# Patient Record
Sex: Female | Born: 1935 | Race: White | Hispanic: No | Smoking: Never smoker
Health system: Southern US, Community
[De-identification: ages and names within clinical notes are randomized; demographics above are authoritative.]

## PROBLEM LIST (undated history)

## (undated) DIAGNOSIS — E039 Hypothyroidism, unspecified: Secondary | ICD-10-CM

## (undated) DIAGNOSIS — I639 Cerebral infarction, unspecified: Secondary | ICD-10-CM

## (undated) DIAGNOSIS — K5792 Diverticulitis of intestine, part unspecified, without perforation or abscess without bleeding: Secondary | ICD-10-CM

## (undated) DIAGNOSIS — Z7901 Long term (current) use of anticoagulants: Secondary | ICD-10-CM

## (undated) DIAGNOSIS — Z90710 Acquired absence of both cervix and uterus: Secondary | ICD-10-CM

## (undated) DIAGNOSIS — I4821 Permanent atrial fibrillation: Secondary | ICD-10-CM

## (undated) DIAGNOSIS — O2230 Deep phlebothrombosis in pregnancy, unspecified trimester: Secondary | ICD-10-CM

## (undated) DIAGNOSIS — I10 Essential (primary) hypertension: Secondary | ICD-10-CM

## (undated) DIAGNOSIS — R001 Bradycardia, unspecified: Secondary | ICD-10-CM

## (undated) DIAGNOSIS — R42 Dizziness and giddiness: Principal | ICD-10-CM

## (undated) DIAGNOSIS — F419 Anxiety disorder, unspecified: Secondary | ICD-10-CM

## (undated) DIAGNOSIS — S329XXA Fracture of unspecified parts of lumbosacral spine and pelvis, initial encounter for closed fracture: Secondary | ICD-10-CM

## (undated) DIAGNOSIS — M4120 Other idiopathic scoliosis, site unspecified: Secondary | ICD-10-CM

## (undated) HISTORY — DX: Bradycardia, unspecified: R00.1

## (undated) HISTORY — DX: Cerebral infarction, unspecified: I63.9

## (undated) HISTORY — DX: Long term (current) use of anticoagulants: Z79.01

## (undated) HISTORY — DX: Dizziness and giddiness: R42

## (undated) HISTORY — DX: Hypothyroidism, unspecified: E03.9

## (undated) HISTORY — DX: Permanent atrial fibrillation: I48.21

## (undated) HISTORY — DX: Acquired absence of both cervix and uterus: Z90.710

## (undated) HISTORY — DX: Anxiety disorder, unspecified: F41.9

## (undated) HISTORY — DX: Diverticulitis of intestine, part unspecified, without perforation or abscess without bleeding: K57.92

## (undated) HISTORY — PX: CHOLECYSTECTOMY: SHX55

## (undated) HISTORY — DX: Fracture of unspecified parts of lumbosacral spine and pelvis, initial encounter for closed fracture: S32.9XXA

## (undated) HISTORY — DX: Other idiopathic scoliosis, site unspecified: M41.20

## (undated) HISTORY — DX: Deep phlebothrombosis in pregnancy, unspecified trimester: O22.30

## (undated) HISTORY — DX: Essential (primary) hypertension: I10

---

## 1998-04-05 ENCOUNTER — Other Ambulatory Visit: Admission: RE | Admit: 1998-04-05 | Discharge: 1998-04-05 | Payer: Self-pay | Admitting: Cardiology

## 1998-05-14 ENCOUNTER — Other Ambulatory Visit: Admission: RE | Admit: 1998-05-14 | Discharge: 1998-05-14 | Payer: Self-pay | Admitting: Cardiology

## 1998-06-25 ENCOUNTER — Inpatient Hospital Stay (HOSPITAL_COMMUNITY): Admission: EM | Admit: 1998-06-25 | Discharge: 1998-06-28 | Payer: Self-pay | Admitting: Emergency Medicine

## 1998-09-29 ENCOUNTER — Ambulatory Visit (HOSPITAL_COMMUNITY): Admission: RE | Admit: 1998-09-29 | Discharge: 1998-09-29 | Payer: Self-pay | Admitting: Family Medicine

## 1999-01-08 HISTORY — PX: CARDIAC CATHETERIZATION: SHX172

## 1999-01-10 ENCOUNTER — Emergency Department (HOSPITAL_COMMUNITY): Admission: EM | Admit: 1999-01-10 | Discharge: 1999-01-10 | Payer: Self-pay | Admitting: Emergency Medicine

## 1999-01-10 ENCOUNTER — Encounter: Payer: Self-pay | Admitting: Emergency Medicine

## 1999-01-18 ENCOUNTER — Ambulatory Visit (HOSPITAL_COMMUNITY): Admission: RE | Admit: 1999-01-18 | Discharge: 1999-01-18 | Payer: Self-pay | Admitting: Cardiology

## 1999-02-07 ENCOUNTER — Encounter: Payer: Self-pay | Admitting: Emergency Medicine

## 1999-02-07 ENCOUNTER — Emergency Department (HOSPITAL_COMMUNITY): Admission: EM | Admit: 1999-02-07 | Discharge: 1999-02-07 | Payer: Self-pay | Admitting: Emergency Medicine

## 1999-05-14 ENCOUNTER — Ambulatory Visit (HOSPITAL_COMMUNITY): Admission: AD | Admit: 1999-05-14 | Discharge: 1999-05-14 | Payer: Self-pay | Admitting: Cardiology

## 1999-12-03 HISTORY — PX: CARDIOVERSION: SHX1299

## 2000-02-03 ENCOUNTER — Ambulatory Visit (HOSPITAL_COMMUNITY): Admission: RE | Admit: 2000-02-03 | Discharge: 2000-02-03 | Payer: Self-pay | Admitting: Cardiology

## 2000-02-03 ENCOUNTER — Encounter: Payer: Self-pay | Admitting: Cardiology

## 2000-02-27 ENCOUNTER — Encounter: Payer: Self-pay | Admitting: Cardiology

## 2000-02-27 ENCOUNTER — Inpatient Hospital Stay (HOSPITAL_COMMUNITY): Admission: AD | Admit: 2000-02-27 | Discharge: 2000-03-04 | Payer: Self-pay | Admitting: Cardiology

## 2000-03-03 ENCOUNTER — Encounter: Payer: Self-pay | Admitting: Cardiology

## 2000-03-04 ENCOUNTER — Encounter: Payer: Self-pay | Admitting: Cardiology

## 2000-03-16 ENCOUNTER — Encounter: Payer: Self-pay | Admitting: Emergency Medicine

## 2000-03-16 ENCOUNTER — Inpatient Hospital Stay (HOSPITAL_COMMUNITY): Admission: EM | Admit: 2000-03-16 | Discharge: 2000-03-16 | Payer: Self-pay | Admitting: Emergency Medicine

## 2000-05-26 ENCOUNTER — Encounter: Payer: Self-pay | Admitting: Emergency Medicine

## 2000-05-26 ENCOUNTER — Encounter: Admission: RE | Admit: 2000-05-26 | Discharge: 2000-05-26 | Payer: Self-pay | Admitting: Emergency Medicine

## 2000-07-26 ENCOUNTER — Encounter: Payer: Self-pay | Admitting: Cardiology

## 2000-07-26 ENCOUNTER — Inpatient Hospital Stay (HOSPITAL_COMMUNITY): Admission: EM | Admit: 2000-07-26 | Discharge: 2000-07-27 | Payer: Self-pay | Admitting: Emergency Medicine

## 2001-01-06 ENCOUNTER — Encounter (INDEPENDENT_AMBULATORY_CARE_PROVIDER_SITE_OTHER): Payer: Self-pay | Admitting: Specialist

## 2001-01-06 ENCOUNTER — Ambulatory Visit (HOSPITAL_COMMUNITY): Admission: RE | Admit: 2001-01-06 | Discharge: 2001-01-06 | Payer: Self-pay | Admitting: Gastroenterology

## 2001-06-10 ENCOUNTER — Encounter: Admission: RE | Admit: 2001-06-10 | Discharge: 2001-06-10 | Payer: Self-pay | Admitting: Emergency Medicine

## 2001-06-10 ENCOUNTER — Encounter: Payer: Self-pay | Admitting: Emergency Medicine

## 2001-08-20 ENCOUNTER — Encounter: Admission: RE | Admit: 2001-08-20 | Discharge: 2001-08-20 | Payer: Self-pay | Admitting: Emergency Medicine

## 2001-08-20 ENCOUNTER — Encounter: Payer: Self-pay | Admitting: Emergency Medicine

## 2002-06-22 ENCOUNTER — Encounter: Admission: RE | Admit: 2002-06-22 | Discharge: 2002-06-22 | Payer: Self-pay | Admitting: Emergency Medicine

## 2002-06-22 ENCOUNTER — Encounter: Payer: Self-pay | Admitting: Emergency Medicine

## 2002-12-27 ENCOUNTER — Inpatient Hospital Stay (HOSPITAL_COMMUNITY): Admission: AD | Admit: 2002-12-27 | Discharge: 2002-12-30 | Payer: Self-pay | Admitting: Cardiology

## 2002-12-27 ENCOUNTER — Encounter: Payer: Self-pay | Admitting: Cardiology

## 2002-12-29 HISTORY — PX: CARDIOVERSION: SHX1299

## 2003-07-10 ENCOUNTER — Encounter: Admission: RE | Admit: 2003-07-10 | Discharge: 2003-07-10 | Payer: Self-pay | Admitting: Emergency Medicine

## 2003-07-10 ENCOUNTER — Encounter: Payer: Self-pay | Admitting: Emergency Medicine

## 2003-09-07 ENCOUNTER — Emergency Department (HOSPITAL_COMMUNITY): Admission: EM | Admit: 2003-09-07 | Discharge: 2003-09-07 | Payer: Self-pay | Admitting: Emergency Medicine

## 2003-09-07 ENCOUNTER — Encounter: Payer: Self-pay | Admitting: Emergency Medicine

## 2004-01-10 ENCOUNTER — Emergency Department (HOSPITAL_COMMUNITY): Admission: EM | Admit: 2004-01-10 | Discharge: 2004-01-10 | Payer: Self-pay | Admitting: Emergency Medicine

## 2004-08-12 ENCOUNTER — Encounter: Admission: RE | Admit: 2004-08-12 | Discharge: 2004-08-12 | Payer: Self-pay | Admitting: Emergency Medicine

## 2005-09-23 ENCOUNTER — Encounter: Admission: RE | Admit: 2005-09-23 | Discharge: 2005-09-23 | Payer: Self-pay | Admitting: Emergency Medicine

## 2006-03-03 ENCOUNTER — Ambulatory Visit (HOSPITAL_COMMUNITY): Admission: RE | Admit: 2006-03-03 | Discharge: 2006-03-03 | Payer: Self-pay | Admitting: *Deleted

## 2006-03-03 HISTORY — PX: PACEMAKER INSERTION: SHX728

## 2006-05-12 ENCOUNTER — Encounter: Admission: RE | Admit: 2006-05-12 | Discharge: 2006-05-12 | Payer: Self-pay | Admitting: Emergency Medicine

## 2006-09-17 ENCOUNTER — Encounter: Payer: Self-pay | Admitting: Vascular Surgery

## 2006-09-17 ENCOUNTER — Encounter: Payer: Self-pay | Admitting: Cardiology

## 2006-09-17 ENCOUNTER — Inpatient Hospital Stay (HOSPITAL_COMMUNITY): Admission: EM | Admit: 2006-09-17 | Discharge: 2006-09-20 | Payer: Self-pay | Admitting: Emergency Medicine

## 2006-09-29 ENCOUNTER — Encounter: Admission: RE | Admit: 2006-09-29 | Discharge: 2006-09-29 | Payer: Self-pay | Admitting: Emergency Medicine

## 2006-10-30 ENCOUNTER — Ambulatory Visit (HOSPITAL_COMMUNITY): Admission: RE | Admit: 2006-10-30 | Discharge: 2006-10-30 | Payer: Self-pay | Admitting: Cardiology

## 2006-10-30 HISTORY — PX: CARDIOVERSION: SHX1299

## 2007-12-14 ENCOUNTER — Encounter: Admission: RE | Admit: 2007-12-14 | Discharge: 2007-12-14 | Payer: Self-pay | Admitting: Emergency Medicine

## 2008-03-24 ENCOUNTER — Encounter: Admission: RE | Admit: 2008-03-24 | Discharge: 2008-03-24 | Payer: Self-pay | Admitting: Gastroenterology

## 2008-09-05 ENCOUNTER — Encounter: Admission: RE | Admit: 2008-09-05 | Discharge: 2008-09-05 | Payer: Self-pay | Admitting: Emergency Medicine

## 2009-05-14 ENCOUNTER — Emergency Department (HOSPITAL_COMMUNITY): Admission: EM | Admit: 2009-05-14 | Discharge: 2009-05-14 | Payer: Self-pay | Admitting: Emergency Medicine

## 2009-08-17 ENCOUNTER — Encounter: Admission: RE | Admit: 2009-08-17 | Discharge: 2009-08-17 | Payer: Self-pay | Admitting: Emergency Medicine

## 2009-11-28 ENCOUNTER — Observation Stay (HOSPITAL_COMMUNITY): Admission: EM | Admit: 2009-11-28 | Discharge: 2009-11-30 | Payer: Self-pay | Admitting: Emergency Medicine

## 2009-11-29 ENCOUNTER — Encounter (INDEPENDENT_AMBULATORY_CARE_PROVIDER_SITE_OTHER): Payer: Self-pay | Admitting: Internal Medicine

## 2010-07-12 ENCOUNTER — Ambulatory Visit: Payer: Self-pay | Admitting: Cardiovascular Disease

## 2010-08-08 ENCOUNTER — Ambulatory Visit: Payer: Self-pay | Admitting: Cardiology

## 2010-09-20 ENCOUNTER — Encounter: Admission: RE | Admit: 2010-09-20 | Discharge: 2010-09-20 | Payer: Self-pay | Admitting: Family Medicine

## 2010-09-20 ENCOUNTER — Ambulatory Visit: Payer: Self-pay | Admitting: Cardiology

## 2010-10-04 ENCOUNTER — Ambulatory Visit: Payer: Self-pay | Admitting: Cardiology

## 2010-10-07 ENCOUNTER — Encounter: Payer: Self-pay | Admitting: Internal Medicine

## 2010-11-01 ENCOUNTER — Ambulatory Visit: Payer: Self-pay | Admitting: Cardiology

## 2010-11-13 ENCOUNTER — Ambulatory Visit: Payer: Self-pay | Admitting: Internal Medicine

## 2010-11-15 ENCOUNTER — Encounter: Payer: Self-pay | Admitting: Internal Medicine

## 2010-11-21 ENCOUNTER — Ambulatory Visit: Payer: Self-pay | Admitting: Cardiology

## 2010-12-21 ENCOUNTER — Encounter: Payer: Self-pay | Admitting: Emergency Medicine

## 2010-12-22 ENCOUNTER — Encounter: Payer: Self-pay | Admitting: Emergency Medicine

## 2010-12-23 ENCOUNTER — Ambulatory Visit: Payer: Self-pay | Admitting: Cardiology

## 2010-12-31 NOTE — Miscellaneous (Signed)
Summary: Device preload  Clinical Lists Changes  Observations: Added new observation of PPM INDICATN: A-fib (10/07/2010 8:37) Added new observation of MAGNET RTE: BOL 85 ERI 65 (10/07/2010 8:37) Added new observation of PPMLEADSTAT2: active (10/07/2010 8:37) Added new observation of PPMLEADSER2: 161096  (10/07/2010 8:37) Added new observation of PPMLEADMOD2: 4463  (10/07/2010 8:37) Added new observation of PPMLEADDOI2: 03/03/2000  (10/07/2010 8:37) Added new observation of PPMLEADLOC2: RV  (10/07/2010 8:37) Added new observation of PPMLEADSTAT1: active  (10/07/2010 8:37) Added new observation of PPMLEADSER1: 045409  (10/07/2010 8:37) Added new observation of PPMLEADMOD1: 4470  (10/07/2010 8:37) Added new observation of PPMLEADDOI1: 03/03/2000  (10/07/2010 8:37) Added new observation of PPMLEADLOC1: RA  (10/07/2010 8:37) Added new observation of PPM DOI: 03/03/2006  (10/07/2010 8:37) Added new observation of PPM SERL#: [id number]  (10/07/2010 8:37) Added new observation of PPM MODL#: [id number]  (10/07/2010 8:37) Added new observation of PACEMAKERMFG: Medtronic  (10/07/2010 8:37) Added new observation of PPM IMP MD: Charlynn Court  (10/07/2010 8:37) Added new observation of PPM REFER MD: Vonna Drafts  (10/07/2010 8:37) Added new observation of PACEMAKER MD: Hillis Range, MD  (10/07/2010 8:37)      PPM Specifications Following MD:  Hillis Range, MD     Referring MD:  Vonna Drafts PPM Vendor:  Medtronic     PPM Model Number:  [id number]     PPM Serial Number:  [id number] PPM DOI:  03/03/2006     PPM Implanting MD:  Charlynn Court  Lead 1    Location: RA     DOI: 03/03/2000     Model #: [id number]     Serial #: [id number]     Status: active Lead 2    Location: RV     DOI: 03/03/2000     Model #: [id number]     Serial #: [id number]     Status: active  Magnet Response Rate:  BOL 85 ERI 65  Indications:  A-fib

## 2011-01-02 NOTE — Procedures (Signed)
Summary: pacer check/medtronic   Current Medications (verified): 1)  Synthroid 50 Mcg Tabs (Levothyroxine Sodium) .... Take 1 Tablet By Mouth Once Daily 2)  Toprol Xl 50 Mg Xr24h-Tab (Metoprolol Succinate) .... Take 2 Tablets By Mouth Once Daily 3)  Warfarin Sodium 5 Mg Tabs (Warfarin Sodium) .... Take As Directed By Coumadin Clinic 4)  Alprazolam 0.25 Mg Tabs (Alprazolam) .... Take As Needed (Up To 4/day) 5)  Caltrate 600+d Plus 600-400 Mg-Unit Tabs (Calcium Carbonate-Vit D-Min) .... Take 3 Tablets By Mouth Once Daily 6)  Womens Multivitamin Plus  Tabs (Multiple Vitamins-Minerals) .... Take 1 Tablet By Mouth Once Daily 7)  Vitamin D3 50000 Unit Caps (Cholecalciferol) .... Take 1 Capsule By Mouth Once A Week  Allergies (verified): No Known Drug Allergies  PPM Specifications Following MD:  Hillis Range, MD     Referring MD:  Vonna Drafts PPM Vendor:  Medtronic     PPM Model Number:  [id number]     PPM Serial Number:  [id number] PPM DOI:  03/03/2006     PPM Implanting MD:  Charlynn Court  Lead 1    Location: RA     DOI: 03/03/2000     Model #: [id number]     Serial #: [id number]     Status: active Lead 2    Location: RV     DOI: 03/03/2000     Model #: [id number]     Serial #: [id number]     Status: active  Magnet Response Rate:  BOL 85 ERI 65  Indications:  A-fib   PPM Follow Up Battery Voltage:  2.93 V       PPM Device Measurements Atrium  Amplitude: 0.4 mV, Impedance: 584 ohms,  Right Ventricle  Amplitude: 4.4 mV, Impedance: 440 ohms, Threshold: 1.0 V at 0.4 msec  Episodes MS Episodes:  7748     Percent Mode Switch:  83.6%     Ventricular High Rate:  0     Ventricular Pacing:  55.8%  Parameters Mode:  VVIR     Lower Rate Limit:  70     Upper Rate Limit:  130 Next Cardiology Appt Due:  02/28/2011 Tech Comments:  PT IN AF 83.6% OF TIME.  + WARFARIN.  NORMAL DEVICE FUNCTION.  NO CHANGES MADE. ROV 02-28-11 @ 1500 W/JA. PT TO THINK ABOUT CARELINK AND LET us KNOW IF SHE WOULD LIKE TO BE ENROLLED.   Vella Kohler  November 15, 2010 3:31 PM

## 2011-01-02 NOTE — Cardiovascular Report (Signed)
Summary: Office Visit   Office Visit   Imported By: Roderic Ovens 12/10/2010 14:10:36  _____________________________________________________________________  External Attachment:    Type:   Image     Comment:   External Document

## 2011-01-17 ENCOUNTER — Other Ambulatory Visit (INDEPENDENT_AMBULATORY_CARE_PROVIDER_SITE_OTHER): Payer: Medicare Other

## 2011-01-17 DIAGNOSIS — Z7901 Long term (current) use of anticoagulants: Secondary | ICD-10-CM

## 2011-01-17 DIAGNOSIS — I4891 Unspecified atrial fibrillation: Secondary | ICD-10-CM

## 2011-02-14 ENCOUNTER — Other Ambulatory Visit (INDEPENDENT_AMBULATORY_CARE_PROVIDER_SITE_OTHER): Payer: Medicare Other

## 2011-02-14 DIAGNOSIS — I4891 Unspecified atrial fibrillation: Secondary | ICD-10-CM

## 2011-02-14 DIAGNOSIS — Z7901 Long term (current) use of anticoagulants: Secondary | ICD-10-CM

## 2011-02-17 ENCOUNTER — Encounter: Payer: Self-pay | Admitting: Internal Medicine

## 2011-02-24 ENCOUNTER — Emergency Department (HOSPITAL_COMMUNITY)
Admission: EM | Admit: 2011-02-24 | Discharge: 2011-02-24 | Disposition: A | Discharge status: Home / Self Care | Payer: Medicare Other | Attending: Emergency Medicine | Admitting: Emergency Medicine

## 2011-02-24 ENCOUNTER — Emergency Department (HOSPITAL_COMMUNITY): Payer: Medicare Other

## 2011-02-24 DIAGNOSIS — R42 Dizziness and giddiness: Secondary | ICD-10-CM | POA: Insufficient documentation

## 2011-02-24 DIAGNOSIS — D696 Thrombocytopenia, unspecified: Secondary | ICD-10-CM | POA: Insufficient documentation

## 2011-02-24 DIAGNOSIS — R51 Headache: Secondary | ICD-10-CM | POA: Insufficient documentation

## 2011-02-24 DIAGNOSIS — E039 Hypothyroidism, unspecified: Secondary | ICD-10-CM | POA: Insufficient documentation

## 2011-02-24 DIAGNOSIS — H538 Other visual disturbances: Secondary | ICD-10-CM | POA: Insufficient documentation

## 2011-02-24 DIAGNOSIS — Z79899 Other long term (current) drug therapy: Secondary | ICD-10-CM | POA: Insufficient documentation

## 2011-02-24 DIAGNOSIS — I10 Essential (primary) hypertension: Secondary | ICD-10-CM | POA: Insufficient documentation

## 2011-02-24 DIAGNOSIS — Z7901 Long term (current) use of anticoagulants: Secondary | ICD-10-CM | POA: Insufficient documentation

## 2011-02-24 DIAGNOSIS — I4891 Unspecified atrial fibrillation: Secondary | ICD-10-CM | POA: Insufficient documentation

## 2011-02-24 LAB — DIFFERENTIAL
Basophils Absolute: 0 10*3/uL (ref 0.0–0.1)
Eosinophils Absolute: 0 10*3/uL (ref 0.0–0.7)
Eosinophils Relative: 1 % (ref 0–5)
Lymphocytes Relative: 28 % (ref 12–46)
Lymphs Abs: 1 10*3/uL (ref 0.7–4.0)
Monocytes Absolute: 0.3 10*3/uL (ref 0.1–1.0)

## 2011-02-24 LAB — COMPREHENSIVE METABOLIC PANEL
BUN: 19 mg/dL (ref 6–23)
CO2: 26 mEq/L (ref 19–32)
Chloride: 102 mEq/L (ref 96–112)
Creatinine, Ser: 0.58 mg/dL (ref 0.4–1.2)
GFR calc non Af Amer: >60 mL/min (ref >60)
Total Bilirubin: 0.5 mg/dL (ref 0.3–1.2)

## 2011-02-24 LAB — CBC
HCT: 35.9 % — ABNORMAL LOW (ref 36.0–46.0)
MCHC: 33.4 g/dL (ref 30.0–36.0)
MCV: 94.5 fL (ref 78.0–100.0)
RDW: 15 % (ref 11.5–15.5)

## 2011-02-24 LAB — URINALYSIS, ROUTINE W REFLEX MICROSCOPIC
Glucose, UA: NEGATIVE mg/dL
Ketones, ur: NEGATIVE mg/dL
Leukocytes, UA: NEGATIVE
Nitrite: NEGATIVE
Protein, ur: NEGATIVE mg/dL
Urobilinogen, UA: 0.2 mg/dL (ref 0.0–1.0)

## 2011-02-24 LAB — URINE MICROSCOPIC-ADD ON

## 2011-02-28 ENCOUNTER — Telehealth: Payer: Self-pay | Admitting: Cardiology

## 2011-02-28 ENCOUNTER — Encounter: Payer: Self-pay | Admitting: Internal Medicine

## 2011-02-28 ENCOUNTER — Ambulatory Visit (INDEPENDENT_AMBULATORY_CARE_PROVIDER_SITE_OTHER): Payer: Medicare Other | Admitting: Internal Medicine

## 2011-02-28 DIAGNOSIS — I4891 Unspecified atrial fibrillation: Secondary | ICD-10-CM | POA: Insufficient documentation

## 2011-02-28 DIAGNOSIS — I495 Sick sinus syndrome: Secondary | ICD-10-CM | POA: Insufficient documentation

## 2011-02-28 DIAGNOSIS — I10 Essential (primary) hypertension: Secondary | ICD-10-CM

## 2011-02-28 MED ORDER — AMLODIPINE BESYLATE 5 MG PO TABS: 5.0000 mg | ORAL_TABLET | Freq: Every day | ORAL | Status: DC

## 2011-02-28 NOTE — Progress Notes (Signed)
Christy Miranda is a pleasant 77 y.o. yo patient with a h/o persistent atrial fibrillation and tachy/brady syndrome sp PPM who presents today to establish care in the Electrophysiology device clinic.  Her most recent generator change was by Dr Reyes Ivan 10/30/2006.  She has previously failed medical therapy with tikosyn.  She appears to now have permanent atrial fibrillation.  She reports occasional palpitations but appears to be tolerating her afib rather well. She remains very active despite her age.    Today, she  denies symptoms of chest pain, shortness of breath, orthopnea, PND, lower extremity edema, dizziness, presyncope, syncope, or neurologic sequela.  The patientis tolerating medications without difficulties and is otherwise without complaint today.   Past Medical History  Diagnosis Date  . HTN (hypertension)   . Atrial fibrillation   . Bradycardia     s/p PPM  . Hypothyroidism   . Anxiety     Past Surgical History  Procedure Date  . Pacemaker insertion     most recent generator (MDT) change 10/30/06 by Dr Reyes Ivan    History   Social History  . Marital Status: Married    Spouse Name: N/A    Number of Children: N/A  . Years of Education: N/A   Occupational History  . Not on file.   Social History Main Topics  . Smoking status: Never Smoker   . Smokeless tobacco: Not on file  . Alcohol Use: No  . Drug Use: No  . Sexually Active: Not on file   Other Topics Concern  . Not on file   Social History Narrative   Lives alone in [place].    No family history on file.  Not on File  Current outpatient prescriptions:ALPRAZolam (XANAX) 0.25 MG tablet, Take 0.25 mg by mouth at bedtime as needed.  , Disp: , Rfl: ;  Calcium Carbonate-Vitamin D (CALCIUM-VITAMIN D) 600-200 MG-UNIT CAPS, Take by mouth daily.  , Disp: , Rfl: ;  levothyroxine (SYNTHROID, LEVOTHROID) 50 MCG tablet, Take 50 mcg by mouth daily.  , Disp: , Rfl: ;  metoprolol (TOPROL-XL) 50 MG 24 hr tablet, 50 mg. TAKE 1  TAB  TWICE A ADY , Disp: , Rfl:  Multiple Minerals-Vitamins (MULTI MEGA MINERALS PO), Take by mouth.  , Disp: , Rfl: ;  warfarin (COUMADIN) 5 MG tablet, Take 5 mg by mouth daily.  , Disp: , Rfl: ;  amLODipine (NORVASC) 5 MG tablet, Take 1 tablet (5 mg total) by mouth daily., Disp: 30 tablet, Rfl: 11  ROS- all systems are reviewed and negative except as per HPI  Physical Exam: Filed Vitals:   02/28/11 1505  BP: 158/90  Pulse: 68  Height: 5' (1.524 m)  Weight: 114 lb (51.71 kg)    GEN- elderly femlae, alert and oriented x 3 today.   Head- normocephalic, atraumatic Eyes-  Sclera clear, conjunctiva pink Ears- hearing intact Oropharynx- clear Neck- supple, no JVP Lymph- no cervical lymphadenopathy Lungs- Clear to ausculation bilaterally, normal work of breathing Chest- R sided pacemaker pocket is well healed Heart- irregular rate and rhythm, no murmurs, rubs or gallops, PMI not laterally displaced GI- soft, NT, ND, + BS Extremities- no clubbing, cyanosis, or edema MS- no significant deformity or atrophy Skin- no rash or lesion Psych- euthymic mood, full affect Neuro- strength and sensation are intact

## 2011-02-28 NOTE — Telephone Encounter (Signed)
Called stating she thinks she took extra Metoprolol yest afternoon. States she called MD on call who told her to monitor BP and HR.  BP was 162/83 HR 95 at 5:30 pm. Was advised to only take 25 mg this AM and then 25 mg later in day. BP before bedtime was 144/   Hr 88. Feels fine. To see Dr. Johney Frame today.

## 2011-02-28 NOTE — Assessment & Plan Note (Signed)
Above goal Salt restriction We will add norvasc 5mg  daily.

## 2011-02-28 NOTE — Assessment & Plan Note (Signed)
Rates appears to be reasonably controlled Continue coumadin (goal INR 2-3)

## 2011-02-28 NOTE — Telephone Encounter (Signed)
PT MISTOOK HER TOPROL AND NEEDS TO SW WITH ANITA. PLACED CHART IN BOX.

## 2011-02-28 NOTE — Patient Instructions (Addendum)
Your physician wants you to follow-up in: 6 months in the device clinic and 12 months with Christy Miranda Christy Miranda will receive a reminder letter in the mail two months in advance. If you don't receive a letter, please call our office to schedule the follow-up appointment.  Your physician has recommended you make the following change in your medication: start Amlodipine 5mg  one tablet by mouth daily

## 2011-02-28 NOTE — Assessment & Plan Note (Signed)
Normal pacemaker function No changes today See paceart report

## 2011-03-03 LAB — CK TOTAL AND CKMB (NOT AT ARMC): CK, MB: 3.9 ng/mL (ref 0.3–4.0)

## 2011-03-03 LAB — PHOSPHORUS: Phosphorus: 3.7 mg/dL (ref 2.3–4.6)

## 2011-03-03 LAB — COMPREHENSIVE METABOLIC PANEL
BUN: 10 mg/dL (ref 6–23)
Calcium: 8.7 mg/dL (ref 8.4–10.5)
Creatinine, Ser: 0.54 mg/dL (ref 0.4–1.2)
GFR calc non Af Amer: >60 mL/min (ref >60)
Glucose, Bld: 87 mg/dL (ref 70–99)
Total Protein: 6.1 g/dL (ref 6.0–8.3)

## 2011-03-03 LAB — URINALYSIS, ROUTINE W REFLEX MICROSCOPIC
Ketones, ur: NEGATIVE mg/dL
Nitrite: NEGATIVE
Protein, ur: NEGATIVE mg/dL
Urobilinogen, UA: 0.2 mg/dL (ref 0.0–1.0)
pH: 7.5 (ref 5.0–8.0)

## 2011-03-03 LAB — CARDIAC PANEL(CRET KIN+CKTOT+MB+TROPI)
Relative Index: INVALID (ref 0.0–2.5)
Relative Index: INVALID (ref 0.0–2.5)
Total CK: 68 U/L (ref 7–177)
Total CK: 78 U/L (ref 7–177)
Troponin I: 0.01 ng/mL (ref 0.00–0.06)

## 2011-03-03 LAB — BASIC METABOLIC PANEL
BUN: 10 mg/dL (ref 6–23)
GFR calc Af Amer: >60 mL/min (ref >60)
GFR calc non Af Amer: >60 mL/min (ref >60)
Potassium: 4 mEq/L (ref 3.5–5.1)
Sodium: 134 mEq/L — ABNORMAL LOW (ref 135–145)

## 2011-03-03 LAB — DIFFERENTIAL
Eosinophils Relative: 0 % (ref 0–5)
Lymphs Abs: 0.5 10*3/uL — ABNORMAL LOW (ref 0.7–4.0)
Monocytes Relative: 5 % (ref 3–12)

## 2011-03-03 LAB — CBC
HCT: 38.8 % (ref 36.0–46.0)
Hemoglobin: 13.1 g/dL (ref 12.0–15.0)
Platelets: 127 10*3/uL — ABNORMAL LOW (ref 150–400)
WBC: 4.1 10*3/uL (ref 4.0–10.5)

## 2011-03-03 LAB — PROTIME-INR
INR: 2.54 — ABNORMAL HIGH (ref 0.00–1.49)
Prothrombin Time: 21.9 seconds — ABNORMAL HIGH (ref 11.6–15.2)
Prothrombin Time: 27.1 seconds — ABNORMAL HIGH (ref 11.6–15.2)

## 2011-03-03 LAB — LIPID PANEL
HDL: 62 mg/dL (ref >39)
Total CHOL/HDL Ratio: 2.5 RATIO
VLDL: 7 mg/dL (ref 0–40)

## 2011-03-03 LAB — HEMOGLOBIN A1C
Hgb A1c MFr Bld: 6.4 % — ABNORMAL HIGH (ref 4.6–6.1)
Mean Plasma Glucose: 137 mg/dL

## 2011-03-03 LAB — APTT: aPTT: 29 seconds (ref 24–37)

## 2011-03-03 LAB — TROPONIN I: Troponin I: 0.01 ng/mL (ref 0.00–0.06)

## 2011-03-03 LAB — POCT CARDIAC MARKERS: CKMB, poc: 1.6 ng/mL (ref 1.0–8.0)

## 2011-03-14 ENCOUNTER — Other Ambulatory Visit: Payer: Medicare Other | Admitting: *Deleted

## 2011-03-14 ENCOUNTER — Ambulatory Visit (INDEPENDENT_AMBULATORY_CARE_PROVIDER_SITE_OTHER): Payer: Medicare Other | Admitting: *Deleted

## 2011-03-14 DIAGNOSIS — I4891 Unspecified atrial fibrillation: Secondary | ICD-10-CM

## 2011-03-17 ENCOUNTER — Telehealth: Payer: Self-pay | Admitting: *Deleted

## 2011-03-17 NOTE — Telephone Encounter (Signed)
Came by Friday for Coumadin check. Was concerned about BP. Took BP and was 134/70. States she was in ER recently for dizziness and when had pacer checked recently Dr. Clide Cliff put her on Norvasc. Was concerned that BP was in normal range and having dizziness.  Per Dr. Sw[place]iland advised not to take Norvasc and continue to monitor BP.

## 2011-04-11 ENCOUNTER — Ambulatory Visit (INDEPENDENT_AMBULATORY_CARE_PROVIDER_SITE_OTHER): Payer: Medicare Other | Admitting: *Deleted

## 2011-04-11 DIAGNOSIS — I4891 Unspecified atrial fibrillation: Secondary | ICD-10-CM

## 2011-04-15 ENCOUNTER — Other Ambulatory Visit: Payer: Self-pay | Admitting: Dermatology

## 2011-04-15 MED ORDER — ALPRAZOLAM 0.25 MG PO TABS: 0.2500 mg | ORAL_TABLET | Freq: Three times a day (TID) | ORAL | Status: DC | PRN

## 2011-04-15 MED ORDER — WARFARIN SODIUM 5 MG PO TABS: ORAL_TABLET | ORAL | Status: DC

## 2011-04-15 NOTE — Telephone Encounter (Signed)
PATIENT NEEDS REFILL WARFARIN AND  ALPROZOLAM.  PATIENT USES [facility].

## 2011-04-15 NOTE — Telephone Encounter (Signed)
Called requesting refills for war[facility]in and [facility]ol[facility]. sent

## 2011-04-18 ENCOUNTER — Telehealth: Payer: Self-pay | Admitting: Cardiology

## 2011-04-18 NOTE — Op Note (Signed)
Christy Miranda, ROTHER              ACCOUNT NO.:  [id number]   MEDICAL RECORD NO.:  [id number]          PATIENT TYPE:  OIB   LOCATION:  2853                         FACILITY:  [facility]   PHYSICIAN:  Peter M. Swaziland, M.D.  DATE OF BIRTH:  01-12-1921   DATE OF PROCEDURE:  10/30/2006  DATE OF DISCHARGE:  10/30/2006                               OPERATIVE REPORT   INDICATIONS FOR PROCEDURE:  This is an 77 year old white female with  recurrent symptomatic and persistent atrial fibrillation.  She is  currently on Tecason therapy.   PROCEDURE NOTE:  The patient received 80 mg of IV Pentothal for  anesthesia.  She subsequently received two synchronized biphasic DC  shocks at 150 joules and then 200 joules.  With the second shock, she  returned to sinus rhythm with atrial pacing and ventricular sensing.  She had no complications.   IMPRESSION:  Successful elective DC cardioversion.           ______________________________  Peter M. [place]iland, M.D.     PMJ/MEDQ  D:  10/30/2006  T:  10/31/2006  Job:  [id number]   cc:   Oley Balm. Georgina Pillion, M.D.

## 2011-04-18 NOTE — H&P (Signed)
Christy Miranda, Christy Miranda              ACCOUNT NO.:  [id number]   MEDICAL RECORD NO.:  [id number]          PATIENT TYPE:  INP   LOCATION:  2023                         FACILITY:  [facility]   PHYSICIAN:  Ulyses Amor, MD DATE OF BIRTH:  1921/10/03   DATE OF ADMISSION:  09/16/2006  DATE OF DISCHARGE:                                HISTORY & PHYSICAL   HISTORY:  Christy Miranda is an 77 year old white woman who is admitted to  [facility] to rule out an embolic transient ischemic attack.  She  presented with transient right arm numbness in the face of a subtherapeutic  INR and chronic atrial fibrillation.   The patient has a history of paroxysmal atrial fibrillation, though has been  more continuous in recent months.  Her Coumadin dosing had been stable until  recently when her INR began to fluctuate, attempts had been made to optimize  her dosing for a therapeutic INR.   This evening she experienced an episode of transient right arm numbness.  This lasted approximately one hour.  There were no other associated  neurologic symptoms.  Numbness resolved spontaneously but prompted her to  call EMS and transport to the Emergency Department.  He patient feels  completely well at this time.  Of note, the patient recently stopped Sotalol  which may have had the effect of destabilizing her INR.   The patient has no history of coronary artery disease or myocardial  infarction.  There is no history of congestive heart failure.  She does have  a permanent pacemaker for sick sinus syndrome.  Here is no history of  hypertension.  There is no history of transient ischemic attack or  cerebrovascular accident.  The patient has no other symptoms or complaints  at this time.  Her only other medical problem is that of hypothyroidism.   MEDICATIONS:  Coumadin, Synthroid, Metoprolol and Alprazolam.   ALLERGIES:  None.   OPERATIONS:  Cholecystectomy, hysterectomy.   SOCIAL HISTORY:  The patient  lives with her husband.  She has been under  stress in recent weeks as her husband was diagnosed with, and has undergone  surgery for malignant melanoma.  She neither smokes cigarettes nor drinks  alcohol.   FAMILY HISTORY:  Noncontributory.   REVIEW OF SYSTEMS:  Reveals no new problems related to her head, eyes, ears,  nose, mouth, throat, lungs, gastrointestinal system, genitourinary system or  extremities.  There is no history of neurologic or psychiatric disorder.  There is no history of fever, chills or weight loss.   PHYSICAL EXAMINATION:  VITAL SIGNS:  Blood pressure 105/63.  Pulse 81 and  irregularly irregular.  Respirations 16.  Temperature 98.1.  Pulse ox 95% on  room air.  GENERAL:  The patient is an elderly white woman in no discomfort.  She is  alert, oriented, appropriate and responsive.  HEAD, EYES, NOSE AND MOUTH:  Normal.  NECK:  Without thyromegaly or adenopathy.  Carotid pulses were palpable  bilaterally and without bruits.  CARDIAC:  Revealed no murmur, gallop, rub or click.  No chest wall  tenderness was noted.  LUNGS:  Clear.  ABDOMEN:  Soft and nontender.  There was no mass, hepatosplenomegaly, bruit,  distention, rebound, guarding or rigidity.  Bowel sounds are normal.  BREASTS, PELVIC AND RECTAL EXAMINATION:  Not performed as they were not  pertinent to the reason for acute care hospitalization.  EXTREMITIES:  Without edema, deviation or deformity.  Radial and dorsalis  pedis pulses were palpable bilaterally.  NEUROLOGIC:  Brief screening neurologic survey was unremarkable.   Electrocardiogram revealed atrial fibrillation with a moderate ventricular  rate (92 beats per minute).  There were nonspecific ST-T wave changes.  The  chest radiograph report was pending at the time of this dictation.  Potassium was 3.9, BUN 13, creatinine 0.5.  The initial set of cardiac  markers revealed a myoglobin of 88.4, CK-MB 3.2, and troponin less than  0.05.  The remaining  studies were pending at the time of this dictation.   IMPRESSION:  1. Transient right arm numbness; rule out embolic transient ischemic      attack.  Subtherapeutic INR.  2. Chronic atrial fibrillation.  3. Status post permanent pacemaker for sick sinus syndrome.  4. Hypothyroidism.   PLAN:  1. Telemetry.  2. Serial cardiac enzymes.  3. Head CT.  4. Lovenox.  5. Carotid Doppler's.  6. Boost Warfarin dose.  7. Neurology consult.  8. Echocardiogram:  Consider TEE.  9. Further measures per Dr. Swaziland.      Ulyses Amor, MD  Electronically Signed     MSC/MEDQ  D:  09/17/2006  T:  09/17/2006  Job:  [id number]   cc:   Ulyses Amor, MD  Peter M. Swaziland, M.D.

## 2011-04-18 NOTE — Telephone Encounter (Signed)
Called to verify that Warfarin and alprazolam was sent to [facility]. Advised sent on 5/15. Alprazolam was faxed on 5/16

## 2011-04-18 NOTE — Op Note (Signed)
[place]. [facility]  Patient:    Christy Miranda, Christy Miranda                       MRN: [id number] Proc. Date: 03/02/00 Adm. Date:  [id number] Attending:  [place], Peter Manning CC:         Oley Balm. Georgina Pillion, M.D.                           Operative Report  PROCEDURE PERFORMED:  Elective cardioversion.  INDICATIONS FOR PROCEDURE:  Patient is a 77 year old white female with sick sinus syndrome and presents with recurrent atrial fibrillation.  ANESTHESIA:  150 mg IV pentothal.  DESCRIPTION OF PROCEDURE:  The patient was given a single synchronized DC cardioversion shock at 200 joules.  This promptly returned to normal sinus rhythm with sinus bradycardia and frequent PACs.  The patient tolerated the procedure ell without complications.  IMPRESSION:  Successful DC cardioversion. DD:  03/02/00 TD:  03/02/00 Job: [id number]

## 2011-04-18 NOTE — Discharge Summary (Signed)
Christy Miranda, Christy Miranda              ACCOUNT NO.:  [id number]   MEDICAL RECORD NO.:  [id number]          PATIENT TYPE:  INP   LOCATION:  2022                         FACILITY:  [facility]   PHYSICIAN:  Peter M. Swaziland, M.D.  DATE OF BIRTH:  1921-11-14   DATE OF ADMISSION:  09/16/2006  DATE OF DISCHARGE:  09/20/2006                                 DISCHARGE SUMMARY   HISTORY OF PRESENT ILLNESS:  Ms. Shiffman is an 77 year old white female who  has a history of chronic atrial fibrillation.  She presented for evaluation  of TIA.  The patient developed transient right arm numbness.  This persisted  approximately 30 minutes, and she presented to emergency room.  By the time  she got here, she had no focal neurologic findings.  It was noted that she  was subtherapeutic on her INR, with an INR of 1.4.  She was admitted for  anticoagulation and further evaluation.  It is noteworthy that the patient  has tachybrady syndrome with chronic atrial fibrillation.  She had  previously failed therapy with Tambocor and Betapace with recurrent  breakthroughs.  We decided to just treat her with rate control, but given  the fact that she is hospitalized at this time, we decided to consider  alternative antiarrhythmic therapy.   For details of her past medical history, social history, family history and  physical exam, please see admission history and physical.   LABORATORY DATA:  Her ECG shows atrial fibrillation with controlled  ventricular response rate of 92.  Her QTC was 404 milliseconds.  She has  diffuse nonspecific ST-T wave abnormality.  Chest x-ray showed no active  disease.   Her white count was 3700, hemoglobin 13.1, hematocrit 38.7, platelets  130,000.  PT was 17.9 with an INR of 1.4.  Sodium was 137, potassium 3.9,  chloride 105, CO2 28, glucose 123, BUN 13, creatinine 0.5.  Liver function  studies were all normal.  Magnesium was 2.0.  Serial CK-MBs and troponins  were all normal.  TSH was normal  at 4.749.   HOSPITAL COURSE:  The patient was admitted to telemetry monitoring.  She was  placed on subcu Lovenox.  She was given additional Coumadin, titrated to  therapeutic INR, and at the time of discharge her protime was 25.7 with an  INR of 2.2.  We also initiated her with Tikosyn therapy at 250 mcg b.i.d..  Her QTC's remained stable.  She remained in atrial fibrillation but had good  rate control.  The patient did have carotid Doppler studies that were  unremarkable.  Her echocardiogram showed mild LVH with mild mitral  insufficiency and moderate left atrial enlargement,and there was no thrombus  seen.  At this point it was felt that the patient was stable for discharge.  She was discharged home on September 20, 2006.   DISCHARGE DIAGNOSES:  1. Transient ischemic attack.  2. Tachybrady syndrome with persistent atrial fibrillation.  3. Hypothyroidism.  4. Chronic anticoagulation.   DISCHARGE MEDICATIONS:  1. Coumadin 7.5 mg Monday, Wednesday and Friday and 5 mg Tuesday,      Thursday, Saturday  and Sunday.  2. Synthroid 0.05 mg daily.  3. Toprol XL 50 mg twice a day.  4. Tikosyn 250 mcg twice a day.  5. Xanax 0.25 mg p.r.n.   The patient is remain on low-salt diet.  She has no restrictions in  activity.  She will have followup protime on Tuesday September 22, 2006 and  will follow up with Dr. Swaziland in approximately 10 days at which time an ECG  will be repeated.  Her discharge status is improved.           ______________________________  Peter M. Swaziland, M.D.     PMJ/MEDQ  D:  09/20/2006  T:  09/21/2006  Job:  [id number]   cc:   Oley Balm. Georgina Pillion, M.D.

## 2011-04-18 NOTE — Cardiovascular Report (Signed)
Christy Miranda, Christy Miranda              ACCOUNT NO.:  [id number]   MEDICAL RECORD NO.:  [id number]          PATIENT TYPE:  OIB   LOCATION:  2899                         FACILITY:  [facility]   PHYSICIAN:  Elmore Guise., M.D.DATE OF BIRTH:  1921/06/20   DATE OF PROCEDURE:  03/03/2006  DATE OF DISCHARGE:                              CARDIAC CATHETERIZATION   PROCEDURE:  Generator change secondary to generator being of an elective  replacement interval.   DESCRIPTION OF PROCEDURE:  The patient was brought to the cardiac cath lab  after appropriate informed consent.  She was prepped and draped sterile  fashion.  A 2 inch incision was made over her old pacemaker in the right  deltopectoral groove and Bovie dissection was then carried down to the  generator itself.  The generator was then removed from the pocket.  The  leads were then checked with the following measurements.  Her atrial  threshold was unable to be obtained because the patient was in atrial  fibrillation/atrial flutter.  Atrial impedance was 453 ohms with R waves  measuring 0.9-1.3 mV.  Her atrial lead is an active fixation, serial number  [id number].  The ventricular lead is an active fixation lead, serial number  [id number].  The following measurements were obtained - threshold of 0.9 volts  at 0.5 milliseconds with impedance of 477 ohms.  R-waves measured greater  than 10 mv.  After checking the integrity of the retained leads, the atrial  and ventricular leads were then identified and placed into a Medtronic  EnRhythm generator [id number], serial number [id number].  The generator was  sewn into the pocket.  The pocket was then closed in 3 continuous layers  with 2-0 followed by 2-0 followed by 4-0 Vicryl suture.  The patient  tolerated the procedure well.  No apparent complications.  She was  discharged from the cardiac cath lab in stable condition.      Elmore Guise., M.D.  Electronically Signed     TWK/MEDQ  D:   03/03/2006  T:  03/03/2006  Job:  [id number]   cc:   Peter M. [place]iland, M.D.  Fax: [phone]

## 2011-04-18 NOTE — H&P (Signed)
Christy Miranda, Christy Miranda              ACCOUNT NO.:  [id number]   MEDICAL RECORD NO.:  [id number]           PATIENT TYPE:   LOCATION:                               FACILITY:  [facility]   PHYSICIAN:  Peter M. Swaziland, M.D.  DATE OF BIRTH:  09-08-1921   DATE OF ADMISSION:  10/30/2006  DATE OF DISCHARGE:                              HISTORY & PHYSICAL   HISTORY OF PRESENT ILLNESS:  Christy Miranda is an 77 year old white female  who is admitted for elective cardioversion.  She has a known history of  chronic paroxysmal atrial fibrillation.  She has previously failed  therapy with Tambocor and Betapace.  Because of persistent symptoms, she  has now been loaded with Tikosyn, but has maintained atrial  fibrillation.  She is now admitted for elective cardioversion.  The  patient has no history of coronary disease.  She had an prior normal  cardiac catheterization.  She was felt to have had a slight transient  ischemic attack in October.  She has no history of congestive heart  failure.   PAST MEDICAL HISTORY:  Significant for previous cholecystectomy and  hysterectomy.  She does have a history of hypothyroidism.  She has had  previous pacemaker implant.   CURRENT MEDICAL THERAPY:  1. Coumadin 7.5 mg 4 days a week 5 mg 3 days a week.  2. Synthroid 0.05 mg daily.  3. Multivitamin daily.  4. Toprol-XL 50 mg b.i.d.  5. Xanax 0.25 mg q.8 h. p.r.n.  6. Tikosyn 250 mcg b.i.d.   SOCIAL HISTORY:  The patient is married.  She denies tobacco or alcohol  use.   FAMILY HISTORY:  Noncontributory.   PHYSICAL EXAM:  GENERAL:  A pleasant white female in no apparent  distress.  VITAL SIGNS:  Weight is 120.  Blood pressure is 132/70, pulse is 88 and  regular.  NECK:  She has no JVD or bruits.  HEENT:  Exam is unremarkable.  LUNGS:  Clear to auscultation and percussion.  CARDIAC:  Exam reveals regular rate and rhythm without gallop, murmur,  rub or click.  ABDOMEN:  Soft and nontender.  She has no  hepatosplenomegaly, masses or  bruits.  Femoral and pedal pulses are 2+ and symmetric.  NEUROLOGIC:  Exam is nonfocal.   LABORATORY DATA:  ECG demonstrates atrial fibrillation with demand  ventricular pacemaker.  She has nonspecific ST-T wave changes.  Pro time  is 20.3.  BUN 17, creatinine 1.8, sodium 136, potassium 5.6.  White  count 3800, hemoglobin 13.1.   IMPRESSION:  1. Persistent atrial fibrillation.  2. Tachy-brady syndrome, status post pacemaker implant.  3. Hypothyroidism.   PLAN:  Proceed with elective cardioversion.           ______________________________  Peter M. Swaziland, M.D.     PMJ/MEDQ  D:  10/29/2006  T:  10/30/2006  Job:  [id number]   cc:   Oley Balm. Georgina Pillion, M.D.

## 2011-04-18 NOTE — Op Note (Signed)
   Christy Miranda, Christy Miranda                          ACCOUNT NO.:  [id number]   MEDICAL RECORD NO.:  [id number]                   PATIENT TYPE:  INP   LOCATION:  3729                                 FACILITY:  [facility]   PHYSICIAN:  Peter M. Swaziland, M.D.               DATE OF BIRTH:  [birth year]   DATE OF PROCEDURE:  12/29/2002  DATE OF DISCHARGE:                                 OPERATIVE REPORT   PROCEDURE PERFORMED:  Elective cardioversion.   INDICATIONS FOR PROCEDURE:  The patient is a 77 year old white female with  recurrent atrial fibrillation.   DESCRIPTION OF PROCEDURE:  The patient was given anesthesia with 125 mg of  IV Pentothal.  She was given a single synchronized DC shock at 200 joules  with prompt return to normal sinus rhythm with dual chamber pacing.  She had  no complications.   IMPRESSION:  Successful DC cardioversion.                                                 Peter M. Swaziland, M.D.    PMJ/MEDQ  D:  12/29/2002  T:  12/29/2002  Job:  [id number]   cc:   Oley Balm. Georgina Pillion, M.D.  [address]  [place]  [place] [address]  Fax: [phone]

## 2011-04-18 NOTE — Discharge Summary (Signed)
Christy Miranda, Christy Miranda                          ACCOUNT NO.:  [id number]   MEDICAL RECORD NO.:  [id number]                   PATIENT TYPE:  INP   LOCATION:  3729                                 FACILITY:  [facility]   PHYSICIAN:  Peter M. Swaziland, M.D.               DATE OF BIRTH:  12/23/1920   DATE OF ADMISSION:  12/27/2002  DATE OF DISCHARGE:  12/30/2002                                 DISCHARGE SUMMARY   HISTORY OF PRESENT ILLNESS:  The patient is a 76 year old white female who  was admitted with recurrent atrial fibrillation despite therapeutic Tambocor  therapy.  Tambocor was discontinued and she was admitted for initiation of  alternative antiarrhythmic drug therapy and for cardioversion.  For details  of her past medical history, social history, family history, and physical  exam please see admission history and physical.   LABORATORY DATA:  White count is 4700, hemoglobin 13.7, hematocrit 40.5,  platelets 166,000.  PT is 22 with an INR of 2.2.  Sodium 136, potassium 4.2,  chloride 101, CO2 29, glucose 98, BUN 14, creatinine 0.6, magnesium was 2.3.  TSH was 2.407.   Chest x-ray showed mild cardiomegaly with elevation of right hemidiaphragm.  No acute disease.  ECG showed atrial fibrillation with occasional  undersensing of atrial pacing lead and intermittent ventricular pacing.   HOSPITAL COURSE:  The patient was admitted to telemetry.  She was initiated  on Betapace at 80 mg twice a day.  She tolerated this well.  After  successful loading on December 29, 2002, she had an elective cardioversion  which successfully returned her to normal sinus rhythm with dual-chamber  pacing.  Her pacemaker device was undersensing the atrial lead and so this  was reprogrammed increasing sensitivity to 0.5 MV.  The patient did well  without any recurrent arrhythmias and was discharged home in stable  condition on December 30, 2002.   DISCHARGE DIAGNOSES:  1. Recurrent atrial fibrillation.  2.  History of hypertension.  3. Anxiety disorder.  4. Hypothyroidism.   DISCHARGE MEDICATIONS:  1. Betapace 80 mg twice a day.  2. Toprol XL 50 mg daily.  3. Synthroid 0.075 mg daily.  4. Xanax 0.25 mg t.i.d. p.r.n.  5. Coumadin 7.5 mg daily.  6. Multivitamin daily.    FOLLOW UP:  The patient will follow up with Dr. Swaziland in two weeks.   DISCHARGE STATUS:  Improved.                                               Peter M. Swaziland, M.D.    PMJ/MEDQ  D:  12/30/2002  T:  12/30/2002  Job:  [id number]   cc:   Oley Balm. Georgina Pillion, M.D.  [address]  [place]  [place] [address]  Fax: [phone]

## 2011-04-18 NOTE — Discharge Summary (Signed)
[place]. [facility]  Patient:    Christy Miranda, Christy Miranda                       MRN: [id number] Adm. Date:  [id number] Disc. Date: [id number] Attending:  [place], Peter Manning CC:         Oley Balm. Georgina Pillion, M.D.             Colleen Can. Deborah Chalk, M.D.                           Discharge Summary  HISTORY OF PRESENT ILLNESS:  Christy Miranda is a 77 year old white female, history of recurrent atrial fibrillation, sick sinus syndrome.  She has previously had symptomatic bradycardia, on Tambocor 100 mg t.i.d.  For this reason, her dose was adjusted downward.  She now presented with recurrent atrial fibrillation with rapid ventricular response.  This was associated with symptoms of dizziness, weakness, and near syncope.  She was admitted for further treatment.  Of note, the patient has been chronically anticoagulated with Coumadin.  She has a prior history of hypertension.  For details of her past medical history, social history, family history, and physical exam, please see admission history and physical.  PHYSICAL EXAMINATION:  GENERAL:  Well-developed white female, in no acute distress.  VITAL SIGNS:  Blood pressure 130/80, pulse is 100 and irregular.  HEENT:  Unremarkable.  NECK:  No JVD, adenopathy, or bruits.  No thyromegaly.  LUNGS:  Clear.  CARDIAC:  Irregular rhythm without gallops, murmurs, or clicks.  ABDOMEN:  Soft and nontender.  EXTREMITIES:  She had excellent pulses.  She had no edema.  NEUROLOGIC:  Nonfocal.  LABORATORY DATA:  ECG showed atrial fibrillation with rapid ventricular response and diffuse ST and T-wave changes.  Chest x-ray showed no active cardiopulmonary disease, possible mild COPD.  White count was 4400, hemoglobin 15.6, hematocrit 46.3, platelets 206,000.  PT was 21.3, INR was 2.6, PTT was 33.  Sodium 136, potassium 4.0, chloride 101, CO2 27, glucose 155, BUN 13, creatinine 0.7.  Other chemistries were normal. TSH was 1.68.  HOSPITAL  COURSE:  The patient was admitted to telemetry.  Her Coumadin was held.  She was started on subcutaneous Lovenox.  Her Tambocor dose was increased to 100 mg t.i.d.  She required IV Cardizem for further rate control. Subsequently, her INR normalized to 1.1.  Her glucose increased to 124.  She remained in atrial fibrillation over the next 48 hours.  On March 02, 2000, she underwent successful DC cardioversion.  This resulted in a return to normal sinus rhythm with marked sinus bradycardia.  On March 03, 2000, the patient underwent permanent dual-chamber transvenous pacemaker placement by Dr. Delfin Edis with a Guidant Discovery 2 DR model 1283, serial number [id number]. Guidant bipolar endocardial pacing leads were used with model 4470 for ventricular lead and 4463 for the atrial lead.  She had good thresholds.  She had no diaphragmatic pacing at 10 volts.  On follow-up, the patient did experience some back and mid chest pain.  Follow-up chest x-ray showed no evidence of pneumothorax or other complications.  Lead appeared to be in stable condition.  The patient was pacing appropriately.  She was discharged home in stable condition on March 04, 2000.  Of note, the patient was fairly hypotensive on admission, so her Prinivil and hydrochlorothiazide were held. Her blood pressure remained under good control throughout the remainder of her hospital stay, and  these drugs were still held at the time of discharge.  DISCHARGE DIAGNOSES: 1. Sick sinus syndrome. 2. Atrial fibrillation, recurrent. 3. Hypertension. 4. Hypothyroidism.  DISCHARGE MEDICATIONS: 1. Tambocor 100 mg t.i.d. 2. Synthroid 0.075 mg daily. 3. Coumadin 7.5 mg daily. 4. Vitamins E, C, and multivitamin daily. 5. Tylenol p.r.n. 6. Prevacid 30 mg per day. 7. Xanax 0.25 mg p.r.n. 8. Prinivil and hydrochlorothiazide are on hold for now.  DISCHARGE INSTRUCTIONS:  The patient was given routine discharge  pacemaker instructions.  FOLLOW-UP:  She was instructed to follow up with Dr. Swaziland in one week.  We will check a pro time at that time, and also assess her pacemaker.  CONDITION ON DISCHARGE:  Improved. DD:  03/04/00 TD:  03/04/00 Job: [id number]

## 2011-04-18 NOTE — H&P (Signed)
NAMEHAWA, Christy Miranda                          ACCOUNT NO.:  [id number]   MEDICAL RECORD NO.:  [id number]                   PATIENT TYPE:   LOCATION:                                       FACILITY:   PHYSICIAN:  Peter M. Swaziland, M.D.               DATE OF BIRTH:  1921-10-03   DATE OF ADMISSION:  12/27/2002  DATE OF DISCHARGE:                                HISTORY & PHYSICAL   CHIEF COMPLAINT:  Irregular heartbeat.   HISTORY OF PRESENT ILLNESS:  The patient is a 77 year old white female with  a longstanding history of paroxysmal atrial fibrillation.  She has a history  of tachybrady syndrome and is status post placement of a DDD pacemaker in  April 2001.  This was a Architect II DR model.  She has been on  chronic therapy with Tambocor since February 2000.  She had had a level  checked one year ago which was therapeutic at 0.83.  She now presents with  recurrent and persistent atrial fibrillation associated with symptoms of  weakness, palpitations, and pounding.  She complains of increased  fatigability.  She denies any chest pain or shortness of breath.  Given her  breakthrough of atrial fibrillation on Tambocor, we have recommended  alternative antiarrhythmic drug therapy and the patient is now admitted at  this time to initiate therapy with sotalol.  Her Tambocor was stopped on  December 23, 2002.  The patient has been chronically anticoagulated.   PAST MEDICAL HISTORY:  1. Anxiety disorder.  2. Sick sinus syndrome with history of atrial fibrillation and prior     pacemaker implant.  3. Chronic anticoagulation.  4. Irritable bowel syndrome.  5. History of cardiac catheterization in February 2000 which was normal.  6. Hypothyroidism.  7. History of diverticulosis.  8. Hypertension.  9. Prior surgeries include T&A, hysterectomy, and cholecystectomy.   CURRENT MEDICATIONS:  1. Coumadin 7.5 mg daily.  2. Synthroid 0.075 mg daily.  3. Xanax 0.25 mg q.8h. p.r.n.  4.  Multivitamin daily.  5. Toprol XL 50 mg per day.   ALLERGIES:  She has no known allergies.   SOCIAL HISTORY:  The patient lives with her husband.  She has one child.  She denies tobacco or alcohol use.   FAMILY HISTORY:  Her father died at age 75 with cancer.  Mother died at age  77 of old age.  One brother died with an aneurysm.   REVIEW OF SYSTEMS:  Review of systems are as noted in HPI, otherwise  unremarkable.  The patient is anxious about her health concerns.   PHYSICAL EXAMINATION:  GENERAL:  The patient is an elderly white female in  no distress.  VITAL SIGNS:  Blood pressure 120/70, pulse is 70 and regular, weight is 132  pounds.  Temperature is afebrile.  HEENT:  Unremarkable.  Pupils equal, round, and reactive.  Conjunctivae  clear.  Oropharynx is  clear.  NECK:  Supple without JVD, adenopathy, thyromegaly, or bruits.  LUNGS:  Clear.  CARDIAC:  Irregular rate and rhythm without murmurs, rubs, or gallops.  ABDOMEN:  Soft and nontender.  There is no hepatosplenomegaly.  EXTREMITIES:  Without edema.  Pulses are 2+ and symmetric.  NEUROLOGIC:  Nonfocal.   LABORATORY DATA:  ECG shows atrial fibrillation with ventricular pacing.  Atrial fibrillation is confirmed by intra-atrial electrogram with her  pacemaker check.   PT is 18 with an INR of 2.5.   IMPRESSION:  1. Recurrent sustained atrial fibrillation.  2. Hypertension.  3. Sick sinus syndrome, status post pacemaker implant.  4. Hypothyroidism.  5. Anxiety disorder.   PLAN:  The patient will be admitted to telemetry.  We will start her on  Betapace at 80 mg twice a day and monitor her QT.  We will anticipate  cardioversion 48 hours after initiating antiarrhythmic drug therapy.                                               Peter M. Swaziland, M.D.    PMJ/MEDQ  D:  12/27/2002  T:  12/27/2002  Job:  [id number]   cc:   Oley Balm. Georgina Pillion, M.D.  [address]  [place]  [place] [address]  Fax: [phone]

## 2011-04-18 NOTE — H&P (Signed)
[place]. [facility]  Patient:    Christy Miranda, Christy Miranda                       MRN: [id number] Adm. Date:  [id number] Attending:  [place], Peter Manning CC:         Peter M. [place]azil[place], M.D.  Oley Balm Georgina Pillion, M.D.   History and Physical  CHIEF COMPLAINT:  New onset of atrial fibrillation.  HISTORY OF PRESENT ILLNESS:  This is a 77 year old woman, a patient of Peter M. Swaziland, M.D., who was admitted with new onset of recurrent atrial fibrillation.  She has a past history of tachy/brady syndrome and has had atrial fibrillation in the past.  She was admitted here in 1999 with atrial fibrillation.  She was admitted again in April of 2001 and underwent insertion of a Guidant Discovery II DR pacer system, DDD pacing, by Sigmund I. Patsi Sears, M.D., on March 03, 2000.  She was discharged in sinus rhythm.  She was readmitted on March 14, 2000, with recurrent atrial fibrillation by W. Ashley Royalty., M.D.  She had a cardiac catheterization in February of 2000, which showed normal coronary arteries.  She has had an adenosine Cardiolite stress test which has shown normal LV function.  Present medications include Tambocor 100 mg three times a day, Coumadin 7.5 mg daily, Prinivil 5 mg daily, Synthroid 0.075 mg daily, and Xanax 0.25 mg p.r.n.  She also has Prevacid on hand for dyspepsia, but has not had to take any recently. This morning at about 5 a.m., the patient awoke with a rapid irregular pulse. She did not have any associated chest pain or dyspnea, but was mildly diaphoretic.  She had no nausea or vomiting.  She talked with Vesta Mixer, Montez Hageman., M.D., who recommended that she try taking an extra half tablet of Tambocor and observing response.  After three to four hours with no response, she called back and we advised her to come to the emergency room.  PAST MEDICAL HISTORY:  She has a past history of high blood pressure and diverticulosis.  She has also been treated for  hypothyroidism and anxiety.  PAST SURGICAL HISTORY:  Tonsillectomy, hysterectomy, cholecystectomy, right breast cyst removal, and pacemaker surgery.  ALLERGIES:  The patient has no known allergies.  FAMILY HISTORY:  Her mother died of old age in her late 25s.  The father died of cancer at age 40.  She had a brother who died of an aneurysm at age 43.  SOCIAL HISTORY:  She is a housewife.  She does not use any caffeine, tobacco, or alcohol.  REVIEW OF SYSTEMS:  Gastrointestinal:  She does have a history of diverticulosis which she treats with a high-fiber diet.  She is not on Metamucil at the present time.  Genitourinary:  History reveals that when she goes into atrial fibrillation, she has urinary frequency.  Otherwise the urinary pattern is normal.  The patient is not a diabetic.  PHYSICAL EXAMINATION:  Her pulse is 105 and irregularly irregular.  The blood pressure is 140/68.  Respirations are normal.  GENERAL APPEARANCE:  Color is good.  SKIN:  Warm and dry.  CHEST:  Clear.  HEART:  No murmurs, rubs, gallops, or clicks.  ABDOMEN:  Soft and nontender.  EXTREMITIES:  Good peripheral pulses.  No phlebitis or edema.  LABORATORY DATA:  Her electrocardiogram shows atrial fibrillation with a rapid ventricular response and occasional paced ventricular beats are present.  The chest x-ray shows  borderline cardiomegaly.  Laboratory studies are pending.  DIAGNOSTIC IMPRESSION: 1. Recurrent atrial fibrillation. 2. DDD pacer. 3. History of hypertension. 4. History of tachy/brady syndrome. 5. History of normal coronary arteries by catheterization in February of 2000. 6. History of hypothyroidism. 7. History of anxiety.  DISPOSITION:  Continue Coumadin.  Start her on an IV Cardizem drip.  Admit to telemetry.  We will also check her thyroid status while she is here to be sure that is not playing a role. DD:  07/26/00 TD:  07/26/00 Job: [id number]

## 2011-04-18 NOTE — H&P (Signed)
[place]. [facility]  Patient:    Christy Miranda, Christy Miranda                       MRN: [id number] Adm. Date:  [id number] Disc. Date: [id number] Attending:  [place], Peter Manning CC:         Peter M. [place]azil[place], M.D.                         History and Physical  CHIEF COMPLAINT:  Rapid heart beat, shortness of breath.  HISTORY:  A 77 year old female with a long history of paroxysmal atrial fibrillation.  She has been tried on Betapace before with severe fatigue and bradycardia, and was later on Tambocor.  She was admitted around two weeks ago ith recurrent atrial fibrillation and was cardioverted, and had significant bradycardia and eventually had a permanent pacemaker implanted on April 3 by Dr. Deborah Chalk with a Guidant Discovery 2 generator, model [phone].  She was placed back on her Coumadin and her Tambocor.  She developed an upper respiratory infection the past four days, and was seen at the [facility] and treated with Cefzil.  She developed recurrent palpitations yesterday and had some cough and upper respiratory congestion.  She called me last night and was noted to have a pulse of around 100, and on review of her data it was suggested she see Dr. Swaziland today.  She was anxious most of the evening and was unable to sleep, and came to the emergency oom in the early morning hours because of this rapid heart rate and shortness of breath.  She was in atrial fibrillation and the pacemaker was tracking the atrial fibrillation.  She was programmed to the DDI mode with drop in her rate to around 100 and is admitted at this time for further treatment and control of atrial fibrillation.  PAST HISTORY:  MEDICAL:  Prior history of hypertension, diverticulosis.  She had a catheterization January 18, 1999 which showed no significant coronary artery disease.  She has  known hypothyroidism and some anxiety.  SURGICAL:  Tonsillectomy, hysterectomy,  cholecystectomy, cyst removed from right breast in 1992.  ALLERGIES:  None.  CURRENT MEDICATIONS: 1. Tambocor 100 b.i.d. 2. Synthroid 0.075 mg daily. 3. Xanax 0.25 mg p.r.n. 4. Coumadin 7.5 mg daily except 10 mg on Friday. 5. Zestril 5 mg daily.  FAMILY HISTORY:  Recorded in the previous note dictated March 29, is reviewed and is unchanged.  SOCIAL HISTORY:  She is married and lives at home with her husband, is a nonsmoker and nondrinker.  REVIEW OF SYSTEMS:  Since previous admission, normally no GU symptoms.  She has a hiatal hernia and known reflux.  She has hypothyroidism.  No neurologic problems. Upper respiratory infection, cough productive of slight yellowish sputum. Remainder of review of systems is otherwise unchanged or unremarkable.  PHYSICAL EXAMINATION:  GENERAL:  She is an anxious female in no acute distress.  VITAL SIGNS:  She is afebrile, blood pressure was 118/70, pulse 110-120 and irregular.  SKIN:  Warm and dry.  HEENT:  Normal.  Pharynx was negative.  NECK:  Supple without masses, thyromegaly, or carotid bruits.  LUNGS:  Clear.  There were no rales.  CARDIOVASCULAR:  Shows an irregular rhythm.  There was no S3.  ABDOMEN:  Soft and nontender.  No aneurysm or masses are noted.  EXTREMITIES:  Femoral and distal pulses are 2+.  There is no edema noted.  NEUROLOGIC:  Normal.  LABORATORY DATA:  A 12-lead ECG shows atrial fibrillation with a rapid response and some paced beats noted.  Chest x-ray shows clear lung fields.  IMPRESSION: 1. Recurrent atrial fibrillation on Tambocor, previously unable to tolerate    Betapace. 2. Sick sinus syndrome with recent implantation of pacemaker. 3. Hypothyroidism, under treatment. 4. Anxiety. 5. Mild hypertension. 6. History of reflux.  RECOMMENDATIONS:  Admit.  Begin beta blockers for rate control of atrial fibrillation.  The pacemaker was interrogated and was reprogrammed to the DDI mode with  reduction in the ventricular response to around 100.  She also needed to be digitalized.  At this point, options of either treatment with dofetilide or amiodarone once the Tambocor has had a chance to wear off would be the next best option for her.  Need to continue on Coumadin. DD:  03/16/00 TD:  03/16/00 Job: [id number]

## 2011-04-18 NOTE — Telephone Encounter (Signed)
Had a question about a prescriptions of Warfrin (which went from a three month to a one month supply) and of Alpransilem (they didn't refill this one). Please call back. I have pulled the chart.

## 2011-04-18 NOTE — Procedures (Signed)
[place]. [facility]  Patient:    Christy Miranda, Christy Miranda                       MRN: [id number] Proc. Date: 03/03/00 Adm. Date:  [id number] Attending:  [place], Peter Manning CC:         Peter M. [place]il[place], M.D.                           Procedure Report  REFERRING PHYSICIAN:  Peter M. [place]azil[place], M.D.  INDICATIONS FOR PROCEDURE:  Tachybrady syndrome with significant bradycardia as well as atrial fibrillation.  DESCRIPTION OF PROCEDURE:  The right subclavicular area was prepped and draped.  The area was infiltrated with 1% Xylocaine.  A subcutaneous pocket was created without difficulty.  Initially, punctures were tried to be made into the axillary vein but were unsuccessful, and we needed to go medial into the subclavian vein.  This seemingly proceeded without difficulty.  Two punctures were made.  The 8-French Cook introducers were used to introduce the leads under fluoroscopy.  The Guidant model 4470, 52-cm ventricular lead was introduced first.  The serial number was [id number].  It was a screw-in active fixation lead.  The R waves were 6.5 mV.  Ventricular threshold point was 5 volts, ventricular current 7.5 mA, and ohms were 640.  The atrial lead was a model 4463, 45-cm bipolar endocardial pacing lead with active fixation, serial number [id number].  The P waves recorded were 1.6 mV, atrial threshold 0.6 volts, atrial current 13.9 mA, and resistance was 348 ohms.  There is no diaphragmatic pacing with both leads at 10 volts.  The leads were sutured in place.  The wound was flushed with kanamycin solution.  A Discovery II DR model 1283, serial number [id number] Guidant pulse generator was connected to the leads.  The pulse generator was sutured in place.  The wound was closed with 2-0 and subsequently, 5-0, Dexon and Steri-Strips were applied.  Overall, the patient tolerated the procedure well but did have some back discomfort felt to be mild to moderate but  persistent throughout most of the case. DD:  03/03/00 TD:  03/03/00 Job: [id number]

## 2011-04-18 NOTE — H&P (Signed)
[place]. [facility]  Patient:    Christy Miranda, Christy Miranda                       MRN: [id number] Adm. Date:  [id number] Attending:  [place], Peter Manning CC:         Oley Balm. Georgina Pillion, M.D.                         History and Physical  CHIEF COMPLAINT:  Irregular heartbeat.  HISTORY OF PRESENT ILLNESS:  Christy Miranda is a 77 year old white female with a history of recurrent atrial fibrillation and sick sinus syndrome.  She has been managed  with chronic anticoagulation and Tambocor therapy.  She had had intolerance in he past on Betapace which resulted in severe bradycardia and she was unable to tolerate this.  Her last elective cardioversion was in June of 2000.  At that time her Tambocor dose was increased to 100 mg t.i.d.  While this resulted in excellent control of her atrial fibrillation, she did one month ago develop symptomatic bradycardia.  For this reason her Tambocor dose was reduced to twice a day. Last night the patient developed recurrent tachycardia and presented to our office, where she was found to be in recurrent atrial fibrillation.  She is now admitted at at this time for increase in her Tambocor therapy and plans for permanent transvenous pacemaker placement.  Of note with her tachycardia this morning the  patient developed near syncope associated with profuse sweating and diaphoresis. Patient has had a history of cardiac catheterization in February of 2000 which showed no coronary disease.  She has normal left ventricular function.  PAST MEDICAL HISTORY:  Significant for diverticulosis.  She has had previous T&A, hysterectomy, and cholecystectomy.  CURRENT MEDICATIONS: 1. Tambocor 100 mg twice a day. 2. Vitamin C and Vitamin E daily. 3. Coumadin 7.5 mg daily. 4. Multivitamin daily. 5. Synthroid 0.075 mg daily. 6. HCTZ 12.5 mg daily. 7. Xanax 0.25 mg p.r.n. 8. Prevacid 30 mg per day. 9. Prinivil 5 mg per day.  SOCIAL HISTORY:  Patient  is married.  She has one child.  She is retired.  She denies tobacco or alcohol use.  FAMILY HISTORY:  Father died at age 59 of cancer.  Mother died at 80 of old age. One brother died of an aneurysm at age 30.  Other family history is unremarkable.  PHYSICAL EXAMINATION:  GENERAL:  Patient is a pleasant white female in no apparent distress.  VITAL SIGNS:  Weight is 133, blood pressure is 130/80, pulse is 100 and irregular.  HEENT:  Unremarkable.  Conjunctivae are clear.  Pupils are equal, round, and reactive.  Oropharynx is clear.  NECK:  Without JVD, adenopathy, thyromegaly, or bruits.  LUNGS:  Clear.  CARDIAC:  Reveals a regular rate and rhythm, normal S1 and S2 without gallops, murmurs, rubs, or clicks.  ABDOMEN:  Soft, nontender.  There is no hepatosplenomegaly, masses, or bruits.   EXTREMITIES:  Femoral pedal pulses are 2+ and symmetric.  She has no edema.  NEUROLOGIC:  Nonfocal.  LABORATORY DATA:  ECG shows atrial fibrillation, rapid ventricular response, rate of 104.  She has diffuse ST/T-wave changes, question inferior lateral ischemia versus digoxin effect.  IMPRESSION: 1. Recurrent atrial fibrillation. 2. Sick sinus syndrome with history of symptomatic bradycardia on drug therapy. 3. History of hypothyroidism.  PLAN:  Patient will be admitted.  Her Coumadin will be held and she will  be anticoagulated with Lovenox.  Her Tambocor dose will be increased back to 100 mg t.i.d.  If further rate control is needed, will add Cardizem.  If patient does ot convert on medical therapy, will consider elective cardioversion on Monday. Once she has converted, she will need placement of a permanent DDD transvenous pacemaker system. DD:  02/29/00 TD:  02/29/00 Job: [id number]

## 2011-04-18 NOTE — Procedures (Signed)
[facility]  Patient:    Christy Miranda, Christy Miranda                     MRN: [id number] Proc. Date: 01/06/01 Adm. Date:  [id number] Attending:  Rich Brave CC:         Oley Balm. Georgina Pillion, M.D.   Procedure Report  PROCEDURE:  Flexible sigmoidoscopy with biopsy.  ENDOSCOPIST:  Florencia Reasons, M.D.  INDICATIONS:  A 77 year old female with intermittent diarrhea, also in need of colon cancer screening.  FINDINGS:  Severe diverticular disease with fixation of the colon up to limit of exam, approximately 55 cm.  Unable to advance scope beyond that point due to colonic fixation.  DESCRIPTION OF PROCEDURE:  The nature, purpose, and risks of the procedure had been discussed with the patient who provided written consent.  Sedation was fentanyl 75 mcg and Versed 8 mg IV without arrhythmias or desaturation. The Olympus pediatric video colonoscope was advanced following an unremarkable digital rectal examination.  There was marked fixation and angulation of the colon in association with extensive diverticulosis.  The quality of the prep was very good, and it was felt that all areas were seen.  There was no evidence of polyps, cancer, colitis, or any frank diverticular strictures.  However, as mentioned, there was such angulation and fixation of the colon that, as I attempted to advance the scope, it eventually became "locked" and could not be advanced further.  It was felt that efforts to be too vigorous with advancement at this point could place the patient at increased risk for perforation or other complications.  We had already tried turning the patient into the supine and right lateral decubitus positions to facilitate passage.  Therefore, it was felt that attempts should be abandoned at this point.  During pullback, I obtained several random mucosal biopsies to check for evidence of microscopic colitis or collagenous colitis in view of her history of  diarrhea.  The patient tolerated the procedure well, and there were no apparent complications.  IMPRESSION:  Severe diverticular disease with fixation of the colon preventing full colonoscopic evaluation.  PLAN:  Await pathology on biopsies.  Consider other home hemoccults and/or barium enema to check the proximal colon. DD:  01/06/01 TD:  01/07/01 Job: [id number]

## 2011-05-09 ENCOUNTER — Telehealth: Payer: Self-pay | Admitting: Cardiology

## 2011-05-09 ENCOUNTER — Ambulatory Visit (INDEPENDENT_AMBULATORY_CARE_PROVIDER_SITE_OTHER): Payer: Medicare Other | Admitting: *Deleted

## 2011-05-09 DIAGNOSIS — I4891 Unspecified atrial fibrillation: Secondary | ICD-10-CM

## 2011-05-09 LAB — POCT INR: INR: 2.4

## 2011-05-09 NOTE — Telephone Encounter (Signed)
Called stating yest she didn't feel good. Before going to bed at 1:00 am this morning BP was 159/97; hr 90; at 11:30;   today bp 151/77;hr 84; feels better today. Coming in at 3:30 to get INR; seeing PCP at 2:00. Wanted to know if someone could see her. Advised that Dr. Swaziland is not here. Tried to reassure her that she does go in and out of AFib and does feel bad when that occurs. Will check her BP when she comes in and if necessary will see Dr. Patty Sermons or schedule her to be seen 1st of week.

## 2011-05-09 NOTE — Telephone Encounter (Signed)
Coming in this afternoon for Coumadin Clinic but has not been feeling well the past couple of days. She is wondering if she should be seen by a Doctor when she comes.

## 2011-06-09 ENCOUNTER — Encounter: Payer: Medicare Other | Admitting: *Deleted

## 2011-06-11 ENCOUNTER — Encounter: Payer: Medicare Other | Admitting: *Deleted

## 2011-06-12 ENCOUNTER — Ambulatory Visit (INDEPENDENT_AMBULATORY_CARE_PROVIDER_SITE_OTHER): Payer: Medicare Other | Admitting: *Deleted

## 2011-06-12 DIAGNOSIS — I4891 Unspecified atrial fibrillation: Secondary | ICD-10-CM

## 2011-06-18 ENCOUNTER — Encounter: Payer: Self-pay | Admitting: Podiatry

## 2011-07-11 ENCOUNTER — Ambulatory Visit (INDEPENDENT_AMBULATORY_CARE_PROVIDER_SITE_OTHER): Payer: Medicare Other | Admitting: *Deleted

## 2011-07-11 DIAGNOSIS — I4891 Unspecified atrial fibrillation: Secondary | ICD-10-CM

## 2011-07-15 ENCOUNTER — Telehealth: Payer: Self-pay | Admitting: Cardiology

## 2011-07-15 MED ORDER — WARFARIN SODIUM 5 MG PO TABS: ORAL_TABLET | ORAL | Status: DC

## 2011-07-15 MED ORDER — METOPROLOL SUCCINATE ER 50 MG PO TB24: 50.0000 mg | ORAL_TABLET | Freq: Two times a day (BID) | ORAL | Status: DC

## 2011-07-15 NOTE — Telephone Encounter (Signed)
Wants to have her TOPRO lXL 50MG , WARFARIN 5MG ,ALPRAZOMAN 0.25MG  [facility]

## 2011-07-15 NOTE — Telephone Encounter (Signed)
Pt aware Dr Swaziland out of the office this week. We will refill scripts for Toprol XL 50mg  2 times daily and Warfarin 5mg  as directed. Alprazolam 0.25  1 tab three times daily as needed.  She is requesting being sent to [facility]. Toprol and Warfarin sent in by e-scribe.

## 2011-07-16 ENCOUNTER — Other Ambulatory Visit: Payer: Self-pay | Admitting: *Deleted

## 2011-07-16 MED ORDER — ALPRAZOLAM 0.25 MG PO TABS: 0.2500 mg | ORAL_TABLET | Freq: Three times a day (TID) | ORAL | Status: DC | PRN

## 2011-07-16 NOTE — Telephone Encounter (Signed)
Called requesting refill on Xanax. Will call into [facility] [phone] since Dr. Swaziland is not in office. Also changed her app because it was made w/Dr. Patty Sermons. Will also do Inr while she is here on 9/7

## 2011-07-21 ENCOUNTER — Ambulatory Visit: Payer: Medicare Other | Admitting: Cardiology

## 2011-08-08 ENCOUNTER — Encounter: Payer: Self-pay | Admitting: *Deleted

## 2011-08-08 ENCOUNTER — Encounter: Payer: Medicare Other | Admitting: *Deleted

## 2011-08-08 ENCOUNTER — Ambulatory Visit (INDEPENDENT_AMBULATORY_CARE_PROVIDER_SITE_OTHER): Payer: Medicare Other | Admitting: Cardiology

## 2011-08-08 ENCOUNTER — Encounter (INDEPENDENT_AMBULATORY_CARE_PROVIDER_SITE_OTHER): Payer: Medicare Other | Admitting: *Deleted

## 2011-08-08 ENCOUNTER — Encounter: Payer: Self-pay | Admitting: Cardiology

## 2011-08-08 DIAGNOSIS — I4891 Unspecified atrial fibrillation: Secondary | ICD-10-CM

## 2011-08-08 DIAGNOSIS — Z7901 Long term (current) use of anticoagulants: Secondary | ICD-10-CM | POA: Insufficient documentation

## 2011-08-08 DIAGNOSIS — I10 Essential (primary) hypertension: Secondary | ICD-10-CM

## 2011-08-08 NOTE — Patient Instructions (Signed)
Continue your current medications.  We will check your INR in 4 weeks.  I will see you again in 6 months.

## 2011-08-08 NOTE — Progress Notes (Signed)
   Christy Miranda Date of Birth: June 19, 1921   History of Present Illness: Christy Miranda and is seen today for followup. She states she is feeling well. He still has a lot of back problems related to her kyphoscoliosis. She sometimes gets tired if she doesn't sleep well. She denies any significant chest pain, shortness of breath, dizziness, or palpitations. She's had no bleeding problems on her Coumadin.  Current Outpatient Prescriptions on File Prior to Visit  Medication Sig Dispense Refill  . ALPRAZolam (XANAX) 0.25 MG tablet Take 1 tablet (0.25 mg total) by mouth 3 (three) times daily as needed.  270 tablet  3  . Calcium Carbonate-Vitamin D (CALCIUM-VITAMIN D) 600-200 MG-UNIT CAPS Take by mouth daily.        [name] levothyroxine (SYNTHROID, LEVOTHROID) 50 MCG tablet Take 50 mcg by mouth daily.        . metoprolol (TOPROL-XL) 50 MG 24 hr tablet Take 1 tablet (50 mg total) by mouth 2 (two) times daily. TAKE 1 TAB  TWICE A ADY  180 tablet  3  . Multiple Minerals-Vitamins (MULTI MEGA MINERALS PO) Take by mouth.        . warfarin (COUMADIN) 5 MG tablet Take as directed; current 7.5 mg x 5 days; 5 mg x 2 days  130 tablet  3    Allergies  Allergen Reactions  . Betapace (Sotalol Hcl)     Past Medical History  Diagnosis Date  . HTN (hypertension)   . Atrial fibrillation   . Bradycardia     s/p PPM  . Hypothyroidism   . Anxiety   . Diverticulitis     history of  . History of hysterectomy   . Long-term (current) use of anticoagulants     Past Surgical History  Procedure Date  . Pacemaker insertion 03/03/2006    most recent generator (MDT) change 10/30/06 by Dr Reyes Ivan  . Cardiac catheterization 01/08/1999    normal LV function  . Cardioversion 10/30/2006    Successful elective DC cardioversion  . Cardioversion 12/29/2002    Successful DC cardioversion  . Cardioversion 12/03/1999    Successful DC cardioversion  . Cholecystectomy     History  Smoking status  . Never Smoker     Smokeless tobacco  . Not on file    History  Alcohol Use No    Family History  Problem Relation Age of Onset  . Coronary artery disease Neg Hx     Review of Systems: As noted in history of present illness.  All other systems were reviewed and are negative.  Physical Exam: BP 138/68  Pulse 78  Ht 5' (1.524 m)  Wt 113 lb 4.8 oz (51.393 kg)  BMI 22.13 kg/m2 She is a pleasant elderly white female in no acute distress. She is normocephalic, atraumatic. Pupils are round and reactive. Extraocular movements are full. Oropharynx is clear. Neck is supple without JVD, adenopathy, thyromegaly, or bruits. Lungs are clear. Cardiac exam reveals a irregular rate and rhythm without gallop, murmur, or click. Her pacemaker site is normal. Abdomen is soft and nontender. She has no edema. Pulses are 2+. Skin is warm and dry. She is alert and oriented x3. Cranial nerves II through XII are intact. Spine reveals significant kyphoscoliosis. LABORATORY DATA: INR today is 2.9.  Assessment / Plan:

## 2011-08-08 NOTE — Assessment & Plan Note (Signed)
INR is therapeutic. We'll recheck again in 4 weeks.

## 2011-08-08 NOTE — Assessment & Plan Note (Signed)
She has permanent atrial fibrillation. Her rate is well-controlled and she is therapeutic on her anticoagulation. She is asymptomatic. She is status post pacemaker implant and had her last pacemaker check in March.

## 2011-08-08 NOTE — Assessment & Plan Note (Signed)
Blood pressure is under excellent control today. 

## 2011-08-15 ENCOUNTER — Encounter: Payer: Medicare Other | Admitting: *Deleted

## 2011-08-15 ENCOUNTER — Ambulatory Visit: Payer: Medicare Other | Admitting: Cardiology

## 2011-09-03 ENCOUNTER — Other Ambulatory Visit: Payer: Self-pay | Admitting: Family Medicine

## 2011-09-03 DIAGNOSIS — Z1231 Encounter for screening mammogram for malignant neoplasm of breast: Secondary | ICD-10-CM

## 2011-09-05 ENCOUNTER — Ambulatory Visit (INDEPENDENT_AMBULATORY_CARE_PROVIDER_SITE_OTHER): Payer: Medicare Other | Admitting: *Deleted

## 2011-09-05 DIAGNOSIS — I4891 Unspecified atrial fibrillation: Secondary | ICD-10-CM

## 2011-09-05 LAB — POCT INR: INR: 2.5

## 2011-09-08 ENCOUNTER — Ambulatory Visit: Payer: Medicare Other

## 2011-09-08 ENCOUNTER — Ambulatory Visit
Admission: RE | Admit: 2011-09-08 | Discharge: 2011-09-08 | Disposition: A | Discharge status: Home / Self Care | Payer: Medicare Other | Source: Ambulatory Visit | Attending: Family Medicine | Admitting: Family Medicine

## 2011-09-08 DIAGNOSIS — Z1231 Encounter for screening mammogram for malignant neoplasm of breast: Secondary | ICD-10-CM

## 2011-09-09 ENCOUNTER — Telehealth: Payer: Self-pay | Admitting: Cardiology

## 2011-09-09 NOTE — Telephone Encounter (Signed)
Pt called. She wants to talk to you  about her kidney.

## 2011-09-09 NOTE — Telephone Encounter (Signed)
Called stating that she saw her PCP (dr. Paulino Rily yest) for her back pain. She did a urinalysis and was told she had blood in urine. Put her on Bactrim 800/160 BID x 3 days and was told to notify our office. Advised OK to be on antibiotic. Checked w/Salley in Coumadin clinic and was advised to tell her to take 1/2 dose of 5 mg x 3 days then resume normal dose. Will recheck at regular time in Nov.

## 2011-09-22 ENCOUNTER — Other Ambulatory Visit: Payer: Self-pay | Admitting: Family Medicine

## 2011-09-22 DIAGNOSIS — N281 Cyst of kidney, acquired: Secondary | ICD-10-CM

## 2011-09-24 ENCOUNTER — Ambulatory Visit
Admission: RE | Admit: 2011-09-24 | Discharge: 2011-09-24 | Disposition: A | Discharge status: Home / Self Care | Payer: Medicare Other | Source: Ambulatory Visit | Attending: Family Medicine | Admitting: Family Medicine

## 2011-09-24 DIAGNOSIS — N281 Cyst of kidney, acquired: Secondary | ICD-10-CM

## 2011-10-02 ENCOUNTER — Ambulatory Visit (INDEPENDENT_AMBULATORY_CARE_PROVIDER_SITE_OTHER): Payer: Medicare Other | Admitting: *Deleted

## 2011-10-02 DIAGNOSIS — I4891 Unspecified atrial fibrillation: Secondary | ICD-10-CM

## 2011-10-02 LAB — POCT INR: INR: 1.9

## 2011-10-03 ENCOUNTER — Encounter: Payer: Medicare Other | Admitting: *Deleted

## 2011-10-31 ENCOUNTER — Ambulatory Visit (INDEPENDENT_AMBULATORY_CARE_PROVIDER_SITE_OTHER): Payer: Medicare Other | Admitting: *Deleted

## 2011-10-31 DIAGNOSIS — I4891 Unspecified atrial fibrillation: Secondary | ICD-10-CM

## 2011-10-31 LAB — POCT INR: INR: 2.7

## 2011-11-28 ENCOUNTER — Ambulatory Visit (INDEPENDENT_AMBULATORY_CARE_PROVIDER_SITE_OTHER): Payer: Medicare Other | Admitting: *Deleted

## 2011-11-28 DIAGNOSIS — I4891 Unspecified atrial fibrillation: Secondary | ICD-10-CM

## 2011-11-28 LAB — POCT INR: INR: 2

## 2011-12-26 ENCOUNTER — Encounter: Payer: Medicare Other | Admitting: *Deleted

## 2011-12-29 ENCOUNTER — Ambulatory Visit (INDEPENDENT_AMBULATORY_CARE_PROVIDER_SITE_OTHER): Payer: Medicare Other | Admitting: *Deleted

## 2011-12-29 DIAGNOSIS — I4891 Unspecified atrial fibrillation: Secondary | ICD-10-CM

## 2011-12-29 LAB — POCT INR: INR: 2.9

## 2011-12-30 ENCOUNTER — Telehealth: Payer: Self-pay | Admitting: Cardiology

## 2011-12-30 NOTE — Telephone Encounter (Signed)
New Msg: Pt calling wanting to speak with nurse/md about getting RX alprazolam 0.25 mg called into [facility]. --3 month supply  Pt said it sometimes takes RX a while to get to [facility] as a result, Pt would like small amount of RX called into [facility] [address] [phone]  Please return pt call to discuss further.

## 2011-12-31 MED ORDER — ALPRAZOLAM 0.25 MG PO TABS: 0.2500 mg | ORAL_TABLET | Freq: Three times a day (TID) | ORAL | Status: DC | PRN

## 2011-12-31 NOTE — Telephone Encounter (Signed)
Left message on patient's personal voice mail,will fax prescription [facility] [phone].[facility] [address] was called and given order for 30 tablets of alprazolam no refills.

## 2012-01-23 DIAGNOSIS — M79609 Pain in unspecified limb: Secondary | ICD-10-CM | POA: Diagnosis not present

## 2012-01-23 DIAGNOSIS — R197 Diarrhea, unspecified: Secondary | ICD-10-CM | POA: Diagnosis not present

## 2012-01-23 DIAGNOSIS — R609 Edema, unspecified: Secondary | ICD-10-CM | POA: Diagnosis not present

## 2012-01-23 DIAGNOSIS — B351 Tinea unguium: Secondary | ICD-10-CM | POA: Diagnosis not present

## 2012-01-26 ENCOUNTER — Ambulatory Visit (INDEPENDENT_AMBULATORY_CARE_PROVIDER_SITE_OTHER): Payer: Medicare Other

## 2012-01-26 DIAGNOSIS — I4891 Unspecified atrial fibrillation: Secondary | ICD-10-CM | POA: Diagnosis not present

## 2012-01-26 LAB — POCT INR: INR: 3.2

## 2012-02-16 ENCOUNTER — Ambulatory Visit (INDEPENDENT_AMBULATORY_CARE_PROVIDER_SITE_OTHER): Payer: Medicare Other | Admitting: Internal Medicine

## 2012-02-16 ENCOUNTER — Encounter: Payer: Self-pay | Admitting: Internal Medicine

## 2012-02-16 VITALS — BP 136/68 | HR 79 | Resp 18 | Ht <= 58 in | Wt 115.4 lb

## 2012-02-16 DIAGNOSIS — I495 Sick sinus syndrome: Secondary | ICD-10-CM

## 2012-02-16 DIAGNOSIS — I4891 Unspecified atrial fibrillation: Secondary | ICD-10-CM | POA: Diagnosis not present

## 2012-02-16 LAB — PACEMAKER DEVICE OBSERVATION
AL IMPEDENCE PM: 488 Ohm
BATTERY VOLTAGE: 2.86 V
BMOD-0002RV: 7
BMOD-0003RV: 30
RV LEAD AMPLITUDE: 9.8 mv
RV LEAD IMPEDENCE PM: 416 Ohm
RV LEAD THRESHOLD: 1 V

## 2012-02-16 NOTE — Assessment & Plan Note (Signed)
Permanent afib Rate controlled No changes today

## 2012-02-16 NOTE — Assessment & Plan Note (Signed)
Normal pacemaker function See Arita Miss Art report  Given permanent afib, I have reprogrammed to VVIR to promote battery longevity going forward

## 2012-02-16 NOTE — Progress Notes (Signed)
Christy Miranda is a pleasant 78 y.o. yo patient with a h/o permanent atrial fibrillation and tachy/brady syndrome sp PPM who presents today to followup in the Electrophysiology device clinic.  Her most recent generator change was by Dr Reyes Ivan 10/30/2006. She remains very active despite her age.    Today, she  denies symptoms of chest pain, shortness of breath, orthopnea, PND, lower extremity edema, dizziness, presyncope, syncope, or neurologic sequela.  The patientis tolerating medications without difficulties and is otherwise without complaint today.   Past Medical History  Diagnosis Date  . HTN (hypertension)   . Permanent atrial fibrillation   . Bradycardia     s/p PPM  . Hypothyroidism   . Anxiety   . Diverticulitis     history of  . History of hysterectomy   . Long-term (current) use of anticoagulants     Past Surgical History  Procedure Date  . Pacemaker insertion 03/03/2006    most recent generator (MDT) change 10/30/06 by Dr Reyes Ivan  . Cardiac catheterization 01/08/1999    normal LV function  . Cardioversion 10/30/2006    Successful elective DC cardioversion  . Cardioversion 12/29/2002    Successful DC cardioversion  . Cardioversion 12/03/1999    Successful DC cardioversion  . Cholecystectomy     History   Social History  . Marital Status: Widowed    Spouse Name: N/A    Number of Children: 1  . Years of Education: N/A   Occupational History  .     Social History Main Topics  . Smoking status: Never Smoker   . Smokeless tobacco: Not on file  . Alcohol Use: No  . Drug Use: No  . Sexually Active: Not on file   Other Topics Concern  . Not on file   Social History Narrative   Lives alone in [place].    Family History  Problem Relation Age of Onset  . Coronary artery disease Neg Hx     Allergies  Allergen Reactions  . Betapace (Sotalol Hcl)     Current outpatient prescriptions:ALPRAZolam (XANAX) 0.25 MG tablet, Take 1 tablet (0.25 mg total) by  mouth 3 (three) times daily as needed., Disp: 270 tablet, Rfl: 3;  Calcium Carbonate-Vitamin D (CALCIUM-VITAMIN D) 600-200 MG-UNIT CAPS, Take by mouth daily.  , Disp: , Rfl: ;  levothyroxine (SYNTHROID, LEVOTHROID) 50 MCG tablet, Take 50 mcg by mouth daily.  , Disp: , Rfl:  metoprolol (TOPROL-XL) 50 MG 24 hr tablet, Take 1 tablet (50 mg total) by mouth 2 (two) times daily. TAKE 1 TAB  TWICE A ADY, Disp: 180 tablet, Rfl: 3;  warfarin (COUMADIN) 5 MG tablet, Take as directed; current 7.5 mg x 5 days; 5 mg x 2 days, Disp: 130 tablet, Rfl: 3;  Multiple Minerals-Vitamins (MULTI MEGA MINERALS PO), Take by mouth.  , Disp: , Rfl:  DISCONTD: ALPRAZolam (XANAX) 0.25 MG tablet, Take 1 tablet (0.25 mg total) by mouth 3 (three) times daily as needed., Disp: 270 tablet, Rfl: 3  Physical Exam: Filed Vitals:   02/16/12 1558  BP: 136/68  Pulse: 79  Resp: 18  Height: 4\' 10"  (1.473 m)  Weight: 115 lb 6.4 oz (52.345 kg)    GEN- elderly femlae, alert and oriented x 3 today.   Head- normocephalic, atraumatic Eyes-  Sclera clear, conjunctiva pink Ears- hearing intact Oropharynx- clear Neck- supple, no JVP Lymph- no cervical lymphadenopathy Lungs- Clear to ausculation bilaterally, normal work of breathing Chest- R sided pacemaker pocket is well healed Heart- irregular rate  and rhythm, no murmurs, rubs or gallops, PMI not laterally displaced GI- soft, NT, ND, + BS Extremities- no clubbing, cyanosis, or edema

## 2012-02-16 NOTE — Patient Instructions (Addendum)
Your physician wants you to follow-up in: 6 months with device clinic and 12 months width Dr Jacquiline Doe will receive a reminder letter in the mail two months in advance. If you don't receive a letter, please call our office to schedule the follow-up appointment.

## 2012-02-20 ENCOUNTER — Ambulatory Visit (INDEPENDENT_AMBULATORY_CARE_PROVIDER_SITE_OTHER): Payer: Medicare Other | Admitting: Cardiology

## 2012-02-20 ENCOUNTER — Encounter: Payer: Self-pay | Admitting: Cardiology

## 2012-02-20 ENCOUNTER — Ambulatory Visit (INDEPENDENT_AMBULATORY_CARE_PROVIDER_SITE_OTHER): Payer: Medicare Other | Admitting: *Deleted

## 2012-02-20 VITALS — BP 128/58 | HR 84 | Ht 60.0 in | Wt 102.0 lb

## 2012-02-20 DIAGNOSIS — Z7901 Long term (current) use of anticoagulants: Secondary | ICD-10-CM

## 2012-02-20 DIAGNOSIS — I4891 Unspecified atrial fibrillation: Secondary | ICD-10-CM

## 2012-02-20 DIAGNOSIS — I495 Sick sinus syndrome: Secondary | ICD-10-CM | POA: Diagnosis not present

## 2012-02-20 DIAGNOSIS — I10 Essential (primary) hypertension: Secondary | ICD-10-CM | POA: Diagnosis not present

## 2012-02-20 LAB — POCT INR: INR: 2.4

## 2012-02-20 NOTE — Assessment & Plan Note (Signed)
Her heart rate is well controlled on metoprolol. She has normal pacemaker function. She is on chronic Coumadin therapy for stroke prophylaxis. We will continue on her current therapy and followup again in 6 months.

## 2012-02-20 NOTE — Progress Notes (Signed)
   Christy Miranda Date of Birth: Apr 26, 1921   History of Present Illness: Christy Miranda and is seen today for followup. She states she is feeling well. She had a recent pacemaker check was satisfactory. She denies any shortness of breath or chest pain. She's had no dizziness. She has had some sinus drainage at night. She is still concerned about her spine and did see a spine specialist but really no further therapy was recommended.  Current Outpatient Prescriptions on File Prior to Visit  Medication Sig Dispense Refill  . ALPRAZolam (XANAX) 0.25 MG tablet Take 1 tablet (0.25 mg total) by mouth 3 (three) times daily as needed.  270 tablet  3  . Calcium Carbonate-Vitamin D (CALCIUM-VITAMIN D) 600-200 MG-UNIT CAPS Take by mouth daily.        [name] levothyroxine (SYNTHROID, LEVOTHROID) 50 MCG tablet Take 50 mcg by mouth daily.        . metoprolol (TOPROL-XL) 50 MG 24 hr tablet Take 1 tablet (50 mg total) by mouth 2 (two) times daily. TAKE 1 TAB  TWICE A ADY  180 tablet  3  . Multiple Minerals-Vitamins (MULTI MEGA MINERALS PO) Take by mouth.        . warfarin (COUMADIN) 5 MG tablet Take as directed; current 7.5 mg x 5 days; 5 mg x 2 days  130 tablet  3    Allergies  Allergen Reactions  . Betapace (Sotalol Hcl)     Past Medical History  Diagnosis Date  . HTN (hypertension)   . Permanent atrial fibrillation   . Bradycardia     s/p PPM  . Hypothyroidism   . Anxiety   . Diverticulitis     history of  . History of hysterectomy   . Long-term (current) use of anticoagulants   . Scoliosis (and kyphoscoliosis), idiopathic     Past Surgical History  Procedure Date  . Pacemaker insertion 03/03/2006    most recent generator (MDT) change 10/30/06 by Dr Reyes Ivan  . Cardiac catheterization 01/08/1999    normal LV function  . Cardioversion 10/30/2006    Successful elective DC cardioversion  . Cardioversion 12/29/2002    Successful DC cardioversion  . Cardioversion 12/03/1999    Successful DC  cardioversion  . Cholecystectomy     History  Smoking status  . Never Smoker   Smokeless tobacco  . Not on file    History  Alcohol Use No    Family History  Problem Relation Age of Onset  . Coronary artery disease Neg Hx     Review of Systems: As noted in history of present illness.  All other systems were reviewed and are negative.  Physical Exam: BP 128/58  Pulse 84  Ht 5' (1.524 m)  Wt 102 lb (46.267 kg)  BMI 19.92 kg/m2 She is a pleasant elderly white female in no acute distress. She is normocephalic, atraumatic. Pupils are round and reactive. Extraocular movements are full. Oropharynx is clear. Neck is supple without JVD, adenopathy, thyromegaly, or bruits. Lungs are clear. Cardiac exam reveals a irregular rate and rhythm without gallop, murmur, or click. Her pacemaker site is normal. Abdomen is soft and nontender. Spine reveals kyphoscoliosis. She has no edema. Pulses are 2+. Skin is warm and dry. She is alert and oriented x3. Cranial nerves II through XII are intact. Spine reveals significant kyphoscoliosis. LABORATORY DATA: INR today is pending  Assessment / Plan:

## 2012-02-20 NOTE — Patient Instructions (Signed)
Continue your current therapy  We will check your coumadin today  I will see you in 6 months.   

## 2012-02-20 NOTE — Assessment & Plan Note (Signed)
We will check her INR today in our Coumadin clinic.

## 2012-02-20 NOTE — Assessment & Plan Note (Signed)
Blood pressure is under excellent control today. 

## 2012-03-10 DIAGNOSIS — L253 Unspecified contact dermatitis due to other chemical products: Secondary | ICD-10-CM | POA: Diagnosis not present

## 2012-03-10 DIAGNOSIS — L408 Other psoriasis: Secondary | ICD-10-CM | POA: Diagnosis not present

## 2012-03-19 ENCOUNTER — Other Ambulatory Visit: Payer: Self-pay | Admitting: Cardiology

## 2012-03-19 ENCOUNTER — Ambulatory Visit (INDEPENDENT_AMBULATORY_CARE_PROVIDER_SITE_OTHER): Payer: Medicare Other | Admitting: *Deleted

## 2012-03-19 DIAGNOSIS — I4891 Unspecified atrial fibrillation: Secondary | ICD-10-CM | POA: Diagnosis not present

## 2012-03-19 LAB — POCT INR: INR: 2.5

## 2012-03-29 DIAGNOSIS — L408 Other psoriasis: Secondary | ICD-10-CM | POA: Diagnosis not present

## 2012-03-29 DIAGNOSIS — I8310 Varicose veins of unspecified lower extremity with inflammation: Secondary | ICD-10-CM | POA: Diagnosis not present

## 2012-03-29 DIAGNOSIS — L608 Other nail disorders: Secondary | ICD-10-CM | POA: Diagnosis not present

## 2012-04-13 ENCOUNTER — Ambulatory Visit (INDEPENDENT_AMBULATORY_CARE_PROVIDER_SITE_OTHER): Payer: Medicare Other

## 2012-04-13 DIAGNOSIS — I4891 Unspecified atrial fibrillation: Secondary | ICD-10-CM

## 2012-04-30 DIAGNOSIS — H1130 Conjunctival hemorrhage, unspecified eye: Secondary | ICD-10-CM | POA: Diagnosis not present

## 2012-05-05 DIAGNOSIS — R110 Nausea: Secondary | ICD-10-CM | POA: Diagnosis not present

## 2012-05-05 DIAGNOSIS — K589 Irritable bowel syndrome without diarrhea: Secondary | ICD-10-CM | POA: Diagnosis not present

## 2012-05-17 ENCOUNTER — Ambulatory Visit (INDEPENDENT_AMBULATORY_CARE_PROVIDER_SITE_OTHER): Payer: Medicare Other | Admitting: Pharmacist

## 2012-05-17 DIAGNOSIS — I4891 Unspecified atrial fibrillation: Secondary | ICD-10-CM

## 2012-05-17 NOTE — Progress Notes (Signed)
I can not close 

## 2012-05-24 DIAGNOSIS — I10 Essential (primary) hypertension: Secondary | ICD-10-CM | POA: Diagnosis not present

## 2012-05-24 DIAGNOSIS — F411 Generalized anxiety disorder: Secondary | ICD-10-CM | POA: Diagnosis not present

## 2012-05-24 DIAGNOSIS — E7800 Pure hypercholesterolemia, unspecified: Secondary | ICD-10-CM | POA: Diagnosis not present

## 2012-05-24 DIAGNOSIS — Z7901 Long term (current) use of anticoagulants: Secondary | ICD-10-CM | POA: Diagnosis not present

## 2012-05-24 DIAGNOSIS — E039 Hypothyroidism, unspecified: Secondary | ICD-10-CM | POA: Diagnosis not present

## 2012-05-24 DIAGNOSIS — M899 Disorder of bone, unspecified: Secondary | ICD-10-CM | POA: Diagnosis not present

## 2012-05-24 DIAGNOSIS — Z79899 Other long term (current) drug therapy: Secondary | ICD-10-CM | POA: Diagnosis not present

## 2012-05-24 DIAGNOSIS — M949 Disorder of cartilage, unspecified: Secondary | ICD-10-CM | POA: Diagnosis not present

## 2012-05-24 DIAGNOSIS — E559 Vitamin D deficiency, unspecified: Secondary | ICD-10-CM | POA: Diagnosis not present

## 2012-05-31 ENCOUNTER — Other Ambulatory Visit: Payer: Self-pay | Admitting: Cardiology

## 2012-06-02 DIAGNOSIS — L408 Other psoriasis: Secondary | ICD-10-CM | POA: Diagnosis not present

## 2012-06-02 DIAGNOSIS — L253 Unspecified contact dermatitis due to other chemical products: Secondary | ICD-10-CM | POA: Diagnosis not present

## 2012-06-25 DIAGNOSIS — M79609 Pain in unspecified limb: Secondary | ICD-10-CM | POA: Diagnosis not present

## 2012-06-25 DIAGNOSIS — B351 Tinea unguium: Secondary | ICD-10-CM | POA: Diagnosis not present

## 2012-06-28 ENCOUNTER — Ambulatory Visit (INDEPENDENT_AMBULATORY_CARE_PROVIDER_SITE_OTHER): Payer: Medicare Other | Admitting: Pharmacist

## 2012-06-28 DIAGNOSIS — I4891 Unspecified atrial fibrillation: Secondary | ICD-10-CM | POA: Diagnosis not present

## 2012-06-28 LAB — POCT INR: INR: 2.3

## 2012-06-30 ENCOUNTER — Other Ambulatory Visit: Payer: Self-pay

## 2012-06-30 DIAGNOSIS — I8310 Varicose veins of unspecified lower extremity with inflammation: Secondary | ICD-10-CM | POA: Diagnosis not present

## 2012-06-30 DIAGNOSIS — L408 Other psoriasis: Secondary | ICD-10-CM | POA: Diagnosis not present

## 2012-06-30 NOTE — Telephone Encounter (Signed)
..   Requested Prescriptions   Pending Prescriptions Disp Refills  . ALPRAZolam (XANAX) 0.25 MG tablet 270 tablet 3    Sig: Take 1 tablet (0.25 mg total) by mouth 3 (three) times daily as needed.   LOV:02/20/12 to Dr Swaziland.

## 2012-07-06 MED ORDER — ALPRAZOLAM 0.25 MG PO TABS: 0.2500 mg | ORAL_TABLET | Freq: Three times a day (TID) | ORAL | Status: DC | PRN

## 2012-07-06 NOTE — Telephone Encounter (Signed)
Spoke to Dr.Jordan ok to refill alprazolam 0.25 mg as needed.Faxed to [facility] fax # [phone].

## 2012-07-21 ENCOUNTER — Telehealth: Payer: Self-pay | Admitting: Cardiology

## 2012-07-21 NOTE — Telephone Encounter (Signed)
New msg Pt wants to discuss her meds. Please call

## 2012-07-22 MED ORDER — ALPRAZOLAM 0.25 MG PO TABS: 0.2500 mg | ORAL_TABLET | Freq: Three times a day (TID) | ORAL | Status: DC | PRN

## 2012-07-22 NOTE — Telephone Encounter (Signed)
Pt calling back re message from yesterday, pls call

## 2012-07-22 NOTE — Telephone Encounter (Signed)
Patient called stated she received alprazolam but only 90 tablets and she always gets 270 tablets to last her for 3 months.Patient was told will fax in a new prescription.

## 2012-08-04 ENCOUNTER — Ambulatory Visit (INDEPENDENT_AMBULATORY_CARE_PROVIDER_SITE_OTHER): Payer: Medicare Other | Admitting: *Deleted

## 2012-08-04 DIAGNOSIS — I4891 Unspecified atrial fibrillation: Secondary | ICD-10-CM | POA: Diagnosis not present

## 2012-08-19 ENCOUNTER — Ambulatory Visit (INDEPENDENT_AMBULATORY_CARE_PROVIDER_SITE_OTHER): Payer: Medicare Other | Admitting: Cardiology

## 2012-08-19 ENCOUNTER — Ambulatory Visit (INDEPENDENT_AMBULATORY_CARE_PROVIDER_SITE_OTHER): Payer: Medicare Other | Admitting: *Deleted

## 2012-08-19 ENCOUNTER — Encounter: Payer: Self-pay | Admitting: Cardiology

## 2012-08-19 VITALS — BP 120/84 | HR 80 | Ht 60.0 in | Wt 110.0 lb

## 2012-08-19 DIAGNOSIS — I495 Sick sinus syndrome: Secondary | ICD-10-CM | POA: Diagnosis not present

## 2012-08-19 DIAGNOSIS — I4891 Unspecified atrial fibrillation: Secondary | ICD-10-CM | POA: Diagnosis not present

## 2012-08-19 DIAGNOSIS — I10 Essential (primary) hypertension: Secondary | ICD-10-CM | POA: Diagnosis not present

## 2012-08-19 DIAGNOSIS — Z950 Presence of cardiac pacemaker: Secondary | ICD-10-CM

## 2012-08-19 LAB — PACEMAKER DEVICE OBSERVATION
AL IMPEDENCE PM: 488 Ohm
BATTERY VOLTAGE: 2.83 V
BMOD-0004RV: 5
BRDY-0002RV: 70 {beats}/min
BRDY-0004RV: 130 {beats}/min
RV LEAD AMPLITUDE: 4.4346 mv
RV LEAD THRESHOLD: 0.5 V

## 2012-08-19 MED ORDER — METOPROLOL SUCCINATE ER 50 MG PO TB24: 50.0000 mg | ORAL_TABLET | Freq: Two times a day (BID) | ORAL | Status: DC

## 2012-08-19 NOTE — Progress Notes (Signed)
PPM check 

## 2012-08-19 NOTE — Patient Instructions (Signed)
Continue your current therapy  I will see you again in 6 months.   

## 2012-08-19 NOTE — Progress Notes (Signed)
Christy Miranda Date of Birth: 08/02/1921   History of Present Illness: Mrs. Christy Miranda and is seen today for followup. She states she is feeling well. She denies any shortness of breath or chest pain. She's had no dizziness. On her pacemaker check today she is approaching ERI.  Current Outpatient Prescriptions on File Prior to Visit  Medication Sig Dispense Refill  . ALPRAZolam (XANAX) 0.25 MG tablet Take 1 tablet (0.25 mg total) by mouth 3 (three) times daily as needed.  270 tablet  0  . Calcium Carbonate-Vitamin D (CALCIUM-VITAMIN D) 600-200 MG-UNIT CAPS Take by mouth daily.        [name] levothyroxine (SYNTHROID, LEVOTHROID) 50 MCG tablet Take 50 mcg by mouth daily.        . Multiple Minerals-Vitamins (MULTI MEGA MINERALS PO) Take by mouth.        . warfarin (COUMADIN) 5 MG tablet Take as directed by anticoagulation clinic  135 tablet  1  . DISCONTD: metoprolol succinate (TOPROL-XL) 50 MG 24 hr tablet TAKE 1 TABLET TWICE DAILY  180 tablet  0    Allergies  Allergen Reactions  . Betapace (Sotalol Hcl)     Past Medical History  Diagnosis Date  . HTN (hypertension)   . Permanent atrial fibrillation   . Bradycardia     s/p PPM  . Hypothyroidism   . Anxiety   . Diverticulitis     history of  . History of hysterectomy   . Long-term (current) use of anticoagulants   . Scoliosis (and kyphoscoliosis), idiopathic     Past Surgical History  Procedure Date  . Pacemaker insertion 03/03/2006    most recent generator (MDT) change 10/30/06 by Dr Reyes Ivan  . Cardiac catheterization 01/08/1999    normal LV function  . Cardioversion 10/30/2006    Successful elective DC cardioversion  . Cardioversion 12/29/2002    Successful DC cardioversion  . Cardioversion 12/03/1999    Successful DC cardioversion  . Cholecystectomy     History  Smoking status  . Never Smoker   Smokeless tobacco  . Not on file    History  Alcohol Use No    Family History  Problem Relation Age of Onset  .  Coronary artery disease Neg Hx     Review of Systems: As noted in history of present illness. She does complain of feeling cold all the time. All other systems were reviewed and are negative.  Physical Exam: BP 120/84  Pulse 80  Ht 5' (1.524 m)  Wt 110 lb (49.896 kg)  BMI 21.48 kg/m2 She is a pleasant elderly white female in no acute distress. She is normocephalic, atraumatic. Pupils are round and reactive. Extraocular movements are full. Oropharynx is clear. Neck is supple without JVD, adenopathy, thyromegaly, or bruits. Lungs are clear. Cardiac exam reveals a irregular rate and rhythm without gallop, murmur, or click. Her pacemaker site is normal. Abdomen is soft and nontender. Spine reveals kyphoscoliosis. She has no edema. Pulses are 2+. Skin is warm and dry. She is alert and oriented x3. Cranial nerves II through XII are intact.   LABORATORY DATA: Recent INR was 2.5. Laboratory data from June 2013 shows thrombocytopenia with a platelet count 108,000. White count is 3300. Hemoglobin 12.1. Complete chemistries, thyroid function studies, and microalbumin were normal. Total cholesterol 156, triglycerides 40, HDL 58, LDL 77.  Assessment / Plan: 1. Tachybradycardia syndrome. Heart rate is well controlled on metoprolol. She is on chronic anticoagulation with Coumadin with therapeutic INRs. Pacemaker is approaching ERI.  She will have repeat pacemaker evaluation in one month.  2. Hypertension, controlled.  3. Cold intolerance. Etiology is unclear. Thyroid studies are normal.

## 2012-08-24 ENCOUNTER — Other Ambulatory Visit: Payer: Self-pay | Admitting: Family Medicine

## 2012-08-24 DIAGNOSIS — Z1231 Encounter for screening mammogram for malignant neoplasm of breast: Secondary | ICD-10-CM

## 2012-08-27 DIAGNOSIS — Z23 Encounter for immunization: Secondary | ICD-10-CM | POA: Diagnosis not present

## 2012-08-29 ENCOUNTER — Other Ambulatory Visit: Payer: Self-pay | Admitting: Cardiology

## 2012-09-03 DIAGNOSIS — R110 Nausea: Secondary | ICD-10-CM | POA: Diagnosis not present

## 2012-09-03 DIAGNOSIS — K589 Irritable bowel syndrome without diarrhea: Secondary | ICD-10-CM | POA: Diagnosis not present

## 2012-09-10 ENCOUNTER — Ambulatory Visit (INDEPENDENT_AMBULATORY_CARE_PROVIDER_SITE_OTHER): Payer: Medicare Other | Admitting: *Deleted

## 2012-09-10 ENCOUNTER — Ambulatory Visit (INDEPENDENT_AMBULATORY_CARE_PROVIDER_SITE_OTHER): Payer: Medicare Other

## 2012-09-10 DIAGNOSIS — I4891 Unspecified atrial fibrillation: Secondary | ICD-10-CM | POA: Diagnosis not present

## 2012-09-10 DIAGNOSIS — I495 Sick sinus syndrome: Secondary | ICD-10-CM

## 2012-09-10 LAB — POCT INR: INR: 2.9

## 2012-09-10 LAB — PACEMAKER DEVICE OBSERVATION
BATTERY VOLTAGE: 2.82 V
BMOD-0004RV: 5
VENTRICULAR PACING PM: 62.9

## 2012-09-10 NOTE — Progress Notes (Signed)
Pacer check in clinic  

## 2012-09-13 DIAGNOSIS — L819 Disorder of pigmentation, unspecified: Secondary | ICD-10-CM | POA: Diagnosis not present

## 2012-09-13 DIAGNOSIS — Z85828 Personal history of other malignant neoplasm of skin: Secondary | ICD-10-CM | POA: Diagnosis not present

## 2012-09-13 DIAGNOSIS — L408 Other psoriasis: Secondary | ICD-10-CM | POA: Diagnosis not present

## 2012-09-14 ENCOUNTER — Encounter: Payer: Self-pay | Admitting: Internal Medicine

## 2012-09-15 ENCOUNTER — Ambulatory Visit
Admission: RE | Admit: 2012-09-15 | Discharge: 2012-09-15 | Disposition: A | Discharge status: Home / Self Care | Payer: Medicare Other | Source: Ambulatory Visit | Attending: Family Medicine | Admitting: Family Medicine

## 2012-09-15 DIAGNOSIS — Z1231 Encounter for screening mammogram for malignant neoplasm of breast: Secondary | ICD-10-CM

## 2012-09-28 ENCOUNTER — Encounter: Payer: Self-pay | Admitting: Internal Medicine

## 2012-10-22 ENCOUNTER — Ambulatory Visit (INDEPENDENT_AMBULATORY_CARE_PROVIDER_SITE_OTHER): Payer: Medicare Other

## 2012-10-22 ENCOUNTER — Ambulatory Visit (INDEPENDENT_AMBULATORY_CARE_PROVIDER_SITE_OTHER): Payer: Medicare Other | Admitting: *Deleted

## 2012-10-22 DIAGNOSIS — I4891 Unspecified atrial fibrillation: Secondary | ICD-10-CM | POA: Diagnosis not present

## 2012-10-22 DIAGNOSIS — I495 Sick sinus syndrome: Secondary | ICD-10-CM

## 2012-10-22 LAB — PACEMAKER DEVICE OBSERVATION
BMOD-0003RV: 30
BMOD-0004RV: 5

## 2012-10-22 LAB — POCT INR: INR: 2.6

## 2012-10-22 NOTE — Progress Notes (Signed)
PPM battery check only. 

## 2012-10-25 ENCOUNTER — Other Ambulatory Visit: Payer: Self-pay | Admitting: Cardiology

## 2012-10-25 NOTE — Telephone Encounter (Signed)
Refill-ALPRAZolam (XANAX) 0.25 MG tablet, pt request 90 day supply, verified preferred [facility].

## 2012-10-26 MED ORDER — ALPRAZOLAM 0.25 MG PO TABS: 0.2500 mg | ORAL_TABLET | Freq: Three times a day (TID) | ORAL | Status: DC | PRN

## 2012-10-26 NOTE — Telephone Encounter (Signed)
Alprazolam 0.25 mg prescription needs Dr.Jordan's signature, will [facility] 10/29/12.

## 2012-10-29 ENCOUNTER — Telehealth: Payer: Self-pay

## 2012-10-29 NOTE — Telephone Encounter (Signed)
Xanax 0.25 mg prescription faxed [facility] fax# [phone].

## 2012-11-02 ENCOUNTER — Telehealth: Payer: Self-pay | Admitting: Cardiology

## 2012-11-02 MED ORDER — FUROSEMIDE 20 MG PO TABS: ORAL_TABLET | ORAL | Status: DC

## 2012-11-02 NOTE — Telephone Encounter (Signed)
Pt is having swelling in her legs

## 2012-11-02 NOTE — Telephone Encounter (Signed)
Patient called stated she has had swelling in lower legs before Thanksgiving.Stated she has no salt,elevates her legs and wears support stockings.Spoke to Dr.Jordan he advised take lasix 20 mg daily for 3 days only.Keep appointment with Dr.Allred this Friday 11/05/12.

## 2012-11-04 ENCOUNTER — Telehealth: Payer: Self-pay | Admitting: Cardiology

## 2012-11-04 NOTE — Telephone Encounter (Signed)
Spoke to patient she stated lasix 20 mg has helped the swelling in her legs.States she only has one more lasix to take and wants to know if she can have a prescription for more.Advised Lasix was just for 3 days.Advised to keep appointment with Dr.Allred 11/05/12.

## 2012-11-04 NOTE — Telephone Encounter (Signed)
Calling re how diuretic is doing, pls call

## 2012-11-05 ENCOUNTER — Other Ambulatory Visit (INDEPENDENT_AMBULATORY_CARE_PROVIDER_SITE_OTHER): Payer: Medicare Other

## 2012-11-05 ENCOUNTER — Encounter: Payer: Self-pay | Admitting: *Deleted

## 2012-11-05 ENCOUNTER — Encounter: Payer: Self-pay | Admitting: Internal Medicine

## 2012-11-05 ENCOUNTER — Ambulatory Visit (INDEPENDENT_AMBULATORY_CARE_PROVIDER_SITE_OTHER): Payer: Medicare Other | Admitting: Internal Medicine

## 2012-11-05 ENCOUNTER — Other Ambulatory Visit: Payer: Self-pay | Admitting: *Deleted

## 2012-11-05 VITALS — BP 136/72 | HR 85 | Wt 109.0 lb

## 2012-11-05 DIAGNOSIS — I4891 Unspecified atrial fibrillation: Secondary | ICD-10-CM

## 2012-11-05 DIAGNOSIS — Z950 Presence of cardiac pacemaker: Secondary | ICD-10-CM

## 2012-11-05 DIAGNOSIS — I10 Essential (primary) hypertension: Secondary | ICD-10-CM | POA: Diagnosis not present

## 2012-11-05 DIAGNOSIS — I495 Sick sinus syndrome: Secondary | ICD-10-CM

## 2012-11-05 DIAGNOSIS — R0989 Other specified symptoms and signs involving the circulatory and respiratory systems: Secondary | ICD-10-CM

## 2012-11-05 DIAGNOSIS — R609 Edema, unspecified: Secondary | ICD-10-CM

## 2012-11-05 LAB — PACEMAKER DEVICE OBSERVATION
BMOD-0004RV: 5
BRDY-0002RV: 60 {beats}/min
BRDY-0004RV: 130 {beats}/min
RV LEAD AMPLITUDE: 4.1 mv
RV LEAD THRESHOLD: 1 V

## 2012-11-05 LAB — CBC WITH DIFFERENTIAL/PLATELET
Basophils Absolute: 0 10*3/uL (ref 0.0–0.1)
Lymphocytes Relative: 21.6 % (ref 12.0–46.0)
Lymphs Abs: 0.9 10*3/uL (ref 0.7–4.0)
Monocytes Relative: 7.7 % (ref 3.0–12.0)
Neutrophils Relative %: 69.5 % (ref 43.0–77.0)
Platelets: 145 10*3/uL — ABNORMAL LOW (ref 150.0–400.0)
RDW: 15.3 % — ABNORMAL HIGH (ref 11.5–14.6)

## 2012-11-05 LAB — BASIC METABOLIC PANEL
CO2: 30 mEq/L (ref 19–32)
Chloride: 98 mEq/L (ref 96–112)
Potassium: 4.3 mEq/L (ref 3.5–5.1)
Sodium: 134 mEq/L — ABNORMAL LOW (ref 135–145)

## 2012-11-05 LAB — PROTIME-INR
INR: 3.2 ratio — ABNORMAL HIGH (ref 0.8–1.0)
Prothrombin Time: 33.5 s — ABNORMAL HIGH (ref 10.2–12.4)

## 2012-11-05 MED ORDER — FUROSEMIDE 20 MG PO TABS: 20.0000 mg | ORAL_TABLET | Freq: Every day | ORAL | Status: DC

## 2012-11-05 NOTE — Patient Instructions (Addendum)
See instruction sheet for generator change.    Your physician has recommended you make the following change in your medication:  1) Take Furosemide 20mg  daily for 5 days

## 2012-11-07 DIAGNOSIS — R609 Edema, unspecified: Secondary | ICD-10-CM | POA: Insufficient documentation

## 2012-11-07 NOTE — Assessment & Plan Note (Signed)
Permanent rate controlled afib

## 2012-11-07 NOTE — Progress Notes (Signed)
PCP:  Emeterio Reeve, MD Primary Cardiologist:  Dr [place]  The patient presents today for electrophysiology followup.  Recent device interrogation revealed ERI battery status.  The patient reports that she has developed worsening BLE edema.  She called Dr [place]and and was placed on lasix 20mg  daily x 3 days with some improvement.  Today, she denies symptoms of palpitations, chest pain, shortness of breath, orthopnea, PND, dizziness, presyncope, syncope, or neurologic sequela.  The patient feels that she is tolerating medications without difficulties and is otherwise without complaint today.   Past Medical History  Diagnosis Date  . HTN (hypertension)   . Permanent atrial fibrillation   . Bradycardia     s/p PPM  . Hypothyroidism   . Anxiety   . Diverticulitis     history of  . History of hysterectomy   . Long-term (current) use of anticoagulants   . Scoliosis (and kyphoscoliosis), idiopathic    Past Surgical History  Procedure Date  . Pacemaker insertion 03/03/2006    most recent generator (MDT) change 10/30/06 by Dr Reyes Ivan  . Cardiac catheterization 01/08/1999    normal LV function  . Cardioversion 10/30/2006    Successful elective DC cardioversion  . Cardioversion 12/29/2002    Successful DC cardioversion  . Cardioversion 12/03/1999    Successful DC cardioversion  . Cholecystectomy     Current Outpatient Prescriptions  Medication Sig Dispense Refill  . ALPRAZolam (XANAX) 0.25 MG tablet Take 1 tablet (0.25 mg total) by mouth 3 (three) times daily as needed.  270 tablet  0  . Calcium Carbonate-Vitamin D (CALCIUM-VITAMIN D) 600-200 MG-UNIT CAPS Take by mouth daily.        [name] levothyroxine (SYNTHROID, LEVOTHROID) 50 MCG tablet Take 50 mcg by mouth daily.        . metoprolol succinate (TOPROL-XL) 50 MG 24 hr tablet Take 1 tablet (50 mg total) by mouth 2 (two) times daily. Take with or immediately following a meal.  180 tablet  3  . Multiple Minerals-Vitamins (MULTI MEGA  MINERALS PO) Take by mouth.        . warfarin (COUMADIN) 5 MG tablet TAKE AS DIRECTED BY ANTICOAGULATION CLINIC  135 tablet  1  . furosemide (LASIX) 20 MG tablet Take 1 tablet (20 mg total) by mouth daily.  30 tablet  3    Allergies  Allergen Reactions  . Betapace (Sotalol Hcl)     History   Social History  . Marital Status: Widowed    Spouse Name: N/A    Number of Children: 1  . Years of Education: N/A   Occupational History  .     Social History Main Topics  . Smoking status: Never Smoker   . Smokeless tobacco: Not on file  . Alcohol Use: No  . Drug Use: No  . Sexually Active: Not on file   Other Topics Concern  . Not on file   Social History Narrative   Lives alone in [place].    Family History  Problem Relation Age of Onset  . Coronary artery disease Neg Hx     ROS-  All systems are reviewed and are negative except as outlined in the HPI above   Physical Exam: Filed Vitals:   11/05/12 1527  BP: 136/72  Pulse: 85  Weight: 109 lb (49.442 kg)    GEN- The patient is elderly but pleasant appearing, alert and oriented x 3 today.   Head- normocephalic, atraumatic Eyes-  Sclera clear, conjunctiva pink Ears- hearing  intact Oropharynx- clear Neck- supple, no JVP Lymph- no cervical lymphadenopathy Lungs- Clear to ausculation bilaterally, normal work of breathing Chest- pacemaker pocket is well healed Heart- Regular rate and rhythm (paced) GI- soft, NT, ND, + BS Extremities- no clubbing, cyanosis, + edema MS- age appropriate muscle atrophy Skin- no rash or lesion Psych- euthymic mood, full affect Neuro- strength and sensation are intact  Pacemaker interrogation- reviewed in detail today,  See PACEART report  Assessment and Plan:  1, Bradycardia Normal pacemaker function See Pace Art report No changes today

## 2012-11-07 NOTE — Assessment & Plan Note (Signed)
Pacemaker has reached ERI and reverted to VVI.  We have reprogrammed to VVIR today.  Risks, benefits, and alternatives to PPM pulse generator replacement were discussed at length today.  The patient understands that risks include but are not limited to infection, bleeding, stroke and additional risks to the heart and lungs should she require lead revision.  She wishes to proceed. We will schedule generator change at the next available time.  She will hold coumadin x 3 days prior to the procedure.

## 2012-11-07 NOTE — Assessment & Plan Note (Signed)
Peripheral edema of unclear etiology No ischemic symptoms and no significant pulmonary edema on exam  2 gram sodium diet and compression stockings advised Continue lasix 20mg  daily x 5 more days  Follow-up with Dr Swaziland if not improved.

## 2012-11-07 NOTE — Assessment & Plan Note (Signed)
Stable No change required today Check BMET today

## 2012-11-08 ENCOUNTER — Other Ambulatory Visit: Payer: Self-pay

## 2012-11-08 ENCOUNTER — Telehealth: Payer: Self-pay | Admitting: Internal Medicine

## 2012-11-08 ENCOUNTER — Encounter (HOSPITAL_COMMUNITY): Payer: Self-pay | Admitting: Pharmacy Technician

## 2012-11-08 DIAGNOSIS — I10 Essential (primary) hypertension: Secondary | ICD-10-CM | POA: Diagnosis not present

## 2012-11-08 DIAGNOSIS — I4891 Unspecified atrial fibrillation: Secondary | ICD-10-CM | POA: Diagnosis not present

## 2012-11-08 DIAGNOSIS — I495 Sick sinus syndrome: Secondary | ICD-10-CM | POA: Diagnosis not present

## 2012-11-08 DIAGNOSIS — Z45018 Encounter for adjustment and management of other part of cardiac pacemaker: Secondary | ICD-10-CM | POA: Diagnosis not present

## 2012-11-08 MED ORDER — FUROSEMIDE 20 MG PO TABS: ORAL_TABLET | ORAL | Status: DC

## 2012-11-08 MED ORDER — SODIUM CHLORIDE 0.9 % IR SOLN
80.0000 mg | Status: AC
  Filled 2012-11-08 (×2): qty 2

## 2012-11-08 MED ORDER — CEFAZOLIN SODIUM-DEXTROSE 2-3 GM-% IV SOLR
2.0000 g | INTRAVENOUS | Status: AC
  Filled 2012-11-08: qty 50

## 2012-11-08 NOTE — Telephone Encounter (Signed)
Pt calling re procedure , has a few questions

## 2012-11-08 NOTE — Telephone Encounter (Signed)
Spoke with patient and answered her questions.

## 2012-11-09 ENCOUNTER — Encounter (HOSPITAL_COMMUNITY)
Admission: RE | Disposition: A | Discharge status: Home / Self Care | Payer: Self-pay | Source: Ambulatory Visit | Attending: Internal Medicine

## 2012-11-09 ENCOUNTER — Ambulatory Visit (HOSPITAL_COMMUNITY)
Admission: RE | Admit: 2012-11-09 | Discharge: 2012-11-09 | Disposition: A | Discharge status: Home / Self Care | Payer: Medicare Other | Source: Ambulatory Visit | Attending: Internal Medicine | Admitting: Internal Medicine

## 2012-11-09 ENCOUNTER — Ambulatory Visit (HOSPITAL_COMMUNITY): Payer: Medicare Other

## 2012-11-09 ENCOUNTER — Other Ambulatory Visit: Payer: Self-pay

## 2012-11-09 DIAGNOSIS — I4891 Unspecified atrial fibrillation: Secondary | ICD-10-CM | POA: Diagnosis not present

## 2012-11-09 DIAGNOSIS — Z45018 Encounter for adjustment and management of other part of cardiac pacemaker: Secondary | ICD-10-CM | POA: Insufficient documentation

## 2012-11-09 DIAGNOSIS — I495 Sick sinus syndrome: Secondary | ICD-10-CM | POA: Diagnosis not present

## 2012-11-09 DIAGNOSIS — I10 Essential (primary) hypertension: Secondary | ICD-10-CM | POA: Insufficient documentation

## 2012-11-09 DIAGNOSIS — Z01818 Encounter for other preprocedural examination: Secondary | ICD-10-CM | POA: Diagnosis not present

## 2012-11-09 DIAGNOSIS — I517 Cardiomegaly: Secondary | ICD-10-CM | POA: Diagnosis not present

## 2012-11-09 HISTORY — PX: PACEMAKER GENERATOR CHANGE: SHX5481

## 2012-11-09 LAB — PROTIME-INR: INR: 1.28 (ref 0.00–1.49)

## 2012-11-09 SURGERY — PACEMAKER GENERATOR CHANGE
Anesthesia: LOCAL

## 2012-11-09 MED ORDER — INSULIN ASPART 100 UNIT/ML ~~LOC~~ SOLN
SUBCUTANEOUS | Status: AC
  Filled 2012-11-09: qty 1

## 2012-11-09 MED ORDER — LIDOCAINE HCL (PF) 1 % IJ SOLN
INTRAMUSCULAR | Status: AC
  Filled 2012-11-09: qty 60

## 2012-11-09 MED ORDER — SODIUM CHLORIDE 0.9 % IV SOLN: 250.0000 mL | INTRAVENOUS | Status: DC

## 2012-11-09 MED ORDER — SODIUM CHLORIDE 0.9 % IJ SOLN: 3.0000 mL | Freq: Two times a day (BID) | INTRAMUSCULAR | Status: DC

## 2012-11-09 MED ORDER — CHLORHEXIDINE GLUCONATE 4 % EX LIQD
60.0000 mL | Freq: Once | CUTANEOUS | Status: DC
  Filled 2012-11-09: qty 60

## 2012-11-09 MED ORDER — CEFAZOLIN SODIUM-DEXTROSE 2-3 GM-% IV SOLR
INTRAVENOUS | Status: AC
  Filled 2012-11-09: qty 50

## 2012-11-09 MED ORDER — MUPIROCIN 2 % EX OINT
TOPICAL_OINTMENT | CUTANEOUS | Status: AC
  Administered 2012-11-09: 1
  Filled 2012-11-09: qty 22

## 2012-11-09 MED ORDER — SODIUM CHLORIDE 0.45 % IV SOLN: INTRAVENOUS | Status: DC

## 2012-11-09 MED ORDER — SODIUM CHLORIDE 0.9 % IJ SOLN: 3.0000 mL | INTRAMUSCULAR | Status: DC | PRN

## 2012-11-09 MED ORDER — MUPIROCIN 2 % EX OINT
TOPICAL_OINTMENT | Freq: Two times a day (BID) | CUTANEOUS | Status: DC
  Filled 2012-11-09: qty 22

## 2012-11-09 NOTE — Interval H&P Note (Signed)
History and Physical Interval Note:  11/09/2012 4:45 PM  Christy Miranda  has presented today for surgery, with the diagnosis of eol  The various methods of treatment have been discussed with the patient and family. After consideration of risks, benefits and other options for treatment, the patient has consented to  Procedure(s) (LRB) with comments: PACEMAKER GENERATOR CHANGE (N/A) as a surgical intervention .  The patient's history has been reviewed, patient examined, no change in status, stable for surgery.  I have reviewed the patient's chart and labs.  Questions were answered to the patient's satisfaction.     Hillis Range

## 2012-11-09 NOTE — H&P (View-Only) (Signed)
  PCP:  WOLTERS,SHARON A, MD Primary Cardiologist:  Dr Jordan  The patient presents today for electrophysiology followup.  Recent device interrogation revealed ERI battery status.  The patient reports that she has developed worsening BLE edema.  She called Dr Jordan and was placed on lasix 20mg daily x 3 days with some improvement.  Today, she denies symptoms of palpitations, chest pain, shortness of breath, orthopnea, PND, dizziness, presyncope, syncope, or neurologic sequela.  The patient feels that she is tolerating medications without difficulties and is otherwise without complaint today.   Past Medical History  Diagnosis Date  . HTN (hypertension)   . Permanent atrial fibrillation   . Bradycardia     s/p PPM  . Hypothyroidism   . Anxiety   . Diverticulitis     history of  . History of hysterectomy   . Long-term (current) use of anticoagulants   . Scoliosis (and kyphoscoliosis), idiopathic    Past Surgical History  Procedure Date  . Pacemaker insertion 03/03/2006    most recent generator (MDT) change 10/30/06 by Dr Kersey  . Cardiac catheterization 01/08/1999    normal LV function  . Cardioversion 10/30/2006    Successful elective DC cardioversion  . Cardioversion 12/29/2002    Successful DC cardioversion  . Cardioversion 12/03/1999    Successful DC cardioversion  . Cholecystectomy     Current Outpatient Prescriptions  Medication Sig Dispense Refill  . ALPRAZolam (XANAX) 0.25 MG tablet Take 1 tablet (0.25 mg total) by mouth 3 (three) times daily as needed.  270 tablet  0  . Calcium Carbonate-Vitamin D (CALCIUM-VITAMIN D) 600-200 MG-UNIT CAPS Take by mouth daily.        . levothyroxine (SYNTHROID, LEVOTHROID) 50 MCG tablet Take 50 mcg by mouth daily.        . metoprolol succinate (TOPROL-XL) 50 MG 24 hr tablet Take 1 tablet (50 mg total) by mouth 2 (two) times daily. Take with or immediately following a meal.  180 tablet  3  . Multiple Minerals-Vitamins (MULTI MEGA  MINERALS PO) Take by mouth.        . warfarin (COUMADIN) 5 MG tablet TAKE AS DIRECTED BY ANTICOAGULATION CLINIC  135 tablet  1  . furosemide (LASIX) 20 MG tablet Take 1 tablet (20 mg total) by mouth daily.  30 tablet  3    Allergies  Allergen Reactions  . Betapace (Sotalol Hcl)     History   Social History  . Marital Status: Widowed    Spouse Name: N/A    Number of Children: 1  . Years of Education: N/A   Occupational History  .     Social History Main Topics  . Smoking status: Never Smoker   . Smokeless tobacco: Not on file  . Alcohol Use: No  . Drug Use: No  . Sexually Active: Not on file   Other Topics Concern  . Not on file   Social History Narrative   Lives alone in [place].    Family History  Problem Relation Age of Onset  . Coronary artery disease Neg Hx     ROS-  All systems are reviewed and are negative except as outlined in the HPI above   Physical Exam: Filed Vitals:   11/05/12 1527  BP: 136/72  Pulse: 85  Weight: 109 lb (49.442 kg)    GEN- The patient is elderly but pleasant appearing, alert and oriented x 3 today.   Head- normocephalic, atraumatic Eyes-  Sclera clear, conjunctiva pink Ears- hearing   intact Oropharynx- clear Neck- supple, no JVP Lymph- no cervical lymphadenopathy Lungs- Clear to ausculation bilaterally, normal work of breathing Chest- pacemaker pocket is well healed Heart- Regular rate and rhythm (paced) GI- soft, NT, ND, + BS Extremities- no clubbing, cyanosis, + edema MS- age appropriate muscle atrophy Skin- no rash or lesion Psych- euthymic mood, full affect Neuro- strength and sensation are intact  Pacemaker interrogation- reviewed in detail today,  See PACEART report  Assessment and Plan:  1, Bradycardia Normal pacemaker function See Pace Art report No changes today   

## 2012-11-09 NOTE — Op Note (Signed)
SURGEON:  Hillis Range, MD     PREPROCEDURE DIAGNOSES:   1. Tachycardia bradycardia syndrome.   2. Permanent atrial fibrillation    POSTPROCEDURE DIAGNOSES:   1. Tachycardia bradycardia syndrome.   2. Permanent atrial fibrillation    PROCEDURES:   1. Pacemaker pulse generator replacement.   2. Skin pocket revision.     INTRODUCTION:  Christy Miranda is a 78 y.o. female with a history of symptomatic bradycardia. She has done well since her pacemaker was implanted.  She has recently reached ERI battery status.  She presents today for pacemaker pulse generator replacement.       DESCRIPTION OF THE PROCEDURE:  Informed written consent was obtained, and the patient was brought to the electrophysiology lab in the fasting state.  The patient's pacemaker was interrogated today and found to be at elective replacement indicator battery status.  The patient required no sedation for the procedure today.  The patient's right chest was prepped and draped in the usual sterile fashion by the EP lab staff.  The skin overlying the existing pacemaker was infiltrated with lidocaine for local analgesia.  A 4-cm incision was made over the pacemaker pocket.  Using a combination of sharp and blunt dissection, the pacemaker was exposed and removed from the body.  The device was disconnected from the leads. A single silk suture was identified and removed which had secured the device to the pectoralis fascia.  There was no foreign matter or debris within the pocket.  The atrial lead was confirmed to be a Guidant model [id number] (serial number [id number]) lead implanted on 03/03/2000.  Due to permanent atrial fibrillation this lead was capped  The right ventricular lead was confirmed to be a Guidant model 4470 (serial number [id number]) lead implanted on the same date as the atrial lead (above).  The lead was examined and itsintegrity was confirmed to be intact.  Right ventricular lead R-waves measured 6.2 mV with impedance of 429 ohms and a  threshold of 1.0 V at 0.5 msec.  The ventricular lead was connected to a [employer] [place] model [id number] (serial number [id number]) pacemaker.  The pocket was revised to accommodate this new device.  Electrocautery was required to assure hemostasis.  The pocket was irrigated with copious gentamicin solution. The pacemaker was then placed into the pocket.  The pocket was then closed in 2 layers with 2-0 Vicryl suture over the subcutaneous and subcuticular layers.  Steri-Strips and a sterile dressing were then applied.  There were no early apparent complications.     CONCLUSIONS:   1. Successful pacemaker pulse generator replacement for elective replacement indicator battery status   2. No early apparent complications.     Hillis Range, MD 11/09/2012 5:38 PM

## 2012-11-10 ENCOUNTER — Telehealth: Payer: Self-pay | Admitting: Internal Medicine

## 2012-11-10 NOTE — Telephone Encounter (Signed)
I have called  the patient back and answered all of her questions

## 2012-11-10 NOTE — Telephone Encounter (Signed)
New problem:   Has questions regarding the incision site.

## 2012-11-12 ENCOUNTER — Encounter: Payer: Self-pay | Admitting: Internal Medicine

## 2012-11-13 ENCOUNTER — Inpatient Hospital Stay (HOSPITAL_COMMUNITY)
Admission: EM | Admit: 2012-11-13 | Discharge: 2012-11-16 | DRG: 300 | Disposition: A | Discharge status: Home Health | Payer: Medicare Other | Attending: Internal Medicine | Admitting: Internal Medicine

## 2012-11-13 ENCOUNTER — Encounter (HOSPITAL_COMMUNITY): Payer: Self-pay | Admitting: Internal Medicine

## 2012-11-13 DIAGNOSIS — I4891 Unspecified atrial fibrillation: Secondary | ICD-10-CM | POA: Diagnosis present

## 2012-11-13 DIAGNOSIS — Z8249 Family history of ischemic heart disease and other diseases of the circulatory system: Secondary | ICD-10-CM

## 2012-11-13 DIAGNOSIS — M7989 Other specified soft tissue disorders: Secondary | ICD-10-CM

## 2012-11-13 DIAGNOSIS — D696 Thrombocytopenia, unspecified: Secondary | ICD-10-CM | POA: Diagnosis present

## 2012-11-13 DIAGNOSIS — R52 Pain, unspecified: Secondary | ICD-10-CM | POA: Diagnosis not present

## 2012-11-13 DIAGNOSIS — Z7901 Long term (current) use of anticoagulants: Secondary | ICD-10-CM | POA: Diagnosis not present

## 2012-11-13 DIAGNOSIS — R791 Abnormal coagulation profile: Secondary | ICD-10-CM | POA: Diagnosis present

## 2012-11-13 DIAGNOSIS — F411 Generalized anxiety disorder: Secondary | ICD-10-CM | POA: Diagnosis present

## 2012-11-13 DIAGNOSIS — I824Z9 Acute embolism and thrombosis of unspecified deep veins of unspecified distal lower extremity: Secondary | ICD-10-CM | POA: Diagnosis not present

## 2012-11-13 DIAGNOSIS — E039 Hypothyroidism, unspecified: Secondary | ICD-10-CM | POA: Diagnosis present

## 2012-11-13 DIAGNOSIS — I10 Essential (primary) hypertension: Secondary | ICD-10-CM | POA: Diagnosis present

## 2012-11-13 DIAGNOSIS — D61818 Other pancytopenia: Secondary | ICD-10-CM | POA: Diagnosis present

## 2012-11-13 DIAGNOSIS — I495 Sick sinus syndrome: Secondary | ICD-10-CM | POA: Diagnosis present

## 2012-11-13 DIAGNOSIS — Z950 Presence of cardiac pacemaker: Secondary | ICD-10-CM | POA: Diagnosis present

## 2012-11-13 DIAGNOSIS — M79609 Pain in unspecified limb: Secondary | ICD-10-CM | POA: Diagnosis not present

## 2012-11-13 DIAGNOSIS — R609 Edema, unspecified: Secondary | ICD-10-CM | POA: Diagnosis not present

## 2012-11-13 DIAGNOSIS — Z79899 Other long term (current) drug therapy: Secondary | ICD-10-CM

## 2012-11-13 LAB — BASIC METABOLIC PANEL
Calcium: 9.1 mg/dL (ref 8.4–10.5)
Creatinine, Ser: 0.56 mg/dL (ref 0.50–1.10)
GFR calc non Af Amer: 80 mL/min — ABNORMAL LOW (ref >90)
Glucose, Bld: 103 mg/dL — ABNORMAL HIGH (ref 70–99)
Sodium: 135 mEq/L (ref 135–145)

## 2012-11-13 LAB — POCT I-STAT, CHEM 8
Calcium, Ion: 1.19 mmol/L (ref 1.13–1.30)
Glucose, Bld: 99 mg/dL (ref 70–99)
HCT: 37 % (ref 36.0–46.0)
Hemoglobin: 12.6 g/dL (ref 12.0–15.0)
Potassium: 4.3 mEq/L (ref 3.5–5.1)
TCO2: 29 mmol/L (ref 0–100)

## 2012-11-13 LAB — CBC
Hemoglobin: 11.8 g/dL — ABNORMAL LOW (ref 12.0–15.0)
MCH: 31.3 pg (ref 26.0–34.0)
MCHC: 33.1 g/dL (ref 30.0–36.0)

## 2012-11-13 LAB — APTT: aPTT: 32 seconds (ref 24–37)

## 2012-11-13 MED ORDER — ZOLPIDEM TARTRATE 5 MG PO TABS: 5.0000 mg | ORAL_TABLET | Freq: Every evening | ORAL | Status: DC | PRN

## 2012-11-13 MED ORDER — PANTOPRAZOLE SODIUM 20 MG PO TBEC
20.0000 mg | DELAYED_RELEASE_TABLET | Freq: Every day | ORAL | Status: DC
  Administered 2012-11-13 – 2012-11-16 (×4): 20 mg via ORAL
  Filled 2012-11-13 (×4): qty 1

## 2012-11-13 MED ORDER — LEVOTHYROXINE SODIUM 50 MCG PO TABS
50.0000 ug | ORAL_TABLET | Freq: Every day | ORAL | Status: DC
Start: 2012-11-14 — End: 2012-11-16
  Administered 2012-11-14 – 2012-11-16 (×3): 50 ug via ORAL
  Filled 2012-11-13 (×4): qty 1

## 2012-11-13 MED ORDER — SODIUM CHLORIDE 0.9 % IV SOLN
INTRAVENOUS | Status: DC
  Administered 2012-11-13 – 2012-11-16 (×4): via INTRAVENOUS

## 2012-11-13 MED ORDER — HEPARIN SODIUM (PORCINE) 5000 UNIT/ML IJ SOLN: 60.0000 [IU]/kg | Freq: Once | INTRAMUSCULAR | Status: DC

## 2012-11-13 MED ORDER — ALPRAZOLAM 0.25 MG PO TABS
0.2500 mg | ORAL_TABLET | Freq: Three times a day (TID) | ORAL | Status: DC | PRN
  Administered 2012-11-13 – 2012-11-16 (×6): 0.25 mg via ORAL
  Filled 2012-11-13 (×6): qty 1

## 2012-11-13 MED ORDER — SODIUM CHLORIDE 0.9 % IJ SOLN
3.0000 mL | Freq: Two times a day (BID) | INTRAMUSCULAR | Status: DC
  Administered 2012-11-15 – 2012-11-16 (×2): 3 mL via INTRAVENOUS

## 2012-11-13 MED ORDER — CALCIUM-VITAMIN D 600-200 MG-UNIT PO CAPS: 1.0000 | ORAL_CAPSULE | Freq: Every day | ORAL | Status: DC

## 2012-11-13 MED ORDER — ONDANSETRON HCL 4 MG PO TABS: 4.0000 mg | ORAL_TABLET | Freq: Four times a day (QID) | ORAL | Status: DC | PRN

## 2012-11-13 MED ORDER — METOPROLOL SUCCINATE ER 50 MG PO TB24
50.0000 mg | ORAL_TABLET | Freq: Two times a day (BID) | ORAL | Status: DC
  Administered 2012-11-13 – 2012-11-16 (×6): 50 mg via ORAL
  Filled 2012-11-13 (×7): qty 1

## 2012-11-13 MED ORDER — ADULT MULTIVITAMIN W/MINERALS CH
1.0000 | ORAL_TABLET | Freq: Every day | ORAL | Status: DC
  Administered 2012-11-14 – 2012-11-16 (×3): 1 via ORAL
  Filled 2012-11-13 (×3): qty 1

## 2012-11-13 MED ORDER — OXYCODONE HCL 5 MG PO TABS: 5.0000 mg | ORAL_TABLET | ORAL | Status: DC | PRN

## 2012-11-13 MED ORDER — ACETAMINOPHEN 325 MG PO TABS: 650.0000 mg | ORAL_TABLET | Freq: Four times a day (QID) | ORAL | Status: DC | PRN

## 2012-11-13 MED ORDER — HYDROMORPHONE HCL PF 1 MG/ML IJ SOLN: 0.5000 mg | INTRAMUSCULAR | Status: DC | PRN

## 2012-11-13 MED ORDER — ONDANSETRON HCL 4 MG/2ML IJ SOLN: 4.0000 mg | Freq: Four times a day (QID) | INTRAMUSCULAR | Status: DC | PRN

## 2012-11-13 MED ORDER — ACETAMINOPHEN 650 MG RE SUPP: 650.0000 mg | Freq: Four times a day (QID) | RECTAL | Status: DC | PRN

## 2012-11-13 MED ORDER — HEPARIN (PORCINE) IN NACL 100-0.45 UNIT/ML-% IJ SOLN
1000.0000 [IU]/h | INTRAMUSCULAR | Status: AC
  Administered 2012-11-13: 900 [IU]/h via INTRAVENOUS
  Administered 2012-11-14: 1000 [IU]/h via INTRAVENOUS
  Filled 2012-11-13 (×2): qty 250

## 2012-11-13 MED ORDER — ALUM & MAG HYDROXIDE-SIMETH 200-200-20 MG/5ML PO SUSP: 30.0000 mL | Freq: Four times a day (QID) | ORAL | Status: DC | PRN

## 2012-11-13 MED ORDER — FUROSEMIDE 20 MG PO TABS
20.0000 mg | ORAL_TABLET | Freq: Every day | ORAL | Status: DC
  Administered 2012-11-14 – 2012-11-16 (×3): 20 mg via ORAL
  Filled 2012-11-13 (×3): qty 1

## 2012-11-13 MED ORDER — CALCIUM CARBONATE-VITAMIN D 500-200 MG-UNIT PO TABS
1.0000 | ORAL_TABLET | Freq: Every day | ORAL | Status: DC
  Administered 2012-11-14 – 2012-11-16 (×3): 1 via ORAL
  Filled 2012-11-13 (×3): qty 1

## 2012-11-13 NOTE — Progress Notes (Signed)
VASCULAR LAB PRELIMINARY  PRELIMINARY  PRELIMINARY  PRELIMINARY  Right lower extremity venous Doppler completed.    Preliminary report:  There is acute, occlusive DVT noted in the right peroneal vein.  All other veins appear thrombus free.  Jerrod Damiano, RVT 11/13/2012, 7:09 PM

## 2012-11-13 NOTE — ED Notes (Signed)
Vascular tech here

## 2012-11-13 NOTE — ED Notes (Signed)
Pt reports she was walking from the door to the kitchen when she had sudden onset of sharp right posterior calf pain. Pt states pt was so severe she couldn't walk. No tenderness with palpation to calf. No injury or pop. Pt is on coumadin for pacemaker to right chest (was replaced last week). Pt reports leg still feels sore. Denies hx of PE or DVT. No swelling noted.

## 2012-11-13 NOTE — ED Notes (Signed)
Per report from [facility] pt was home and had a sudden onset of R leg pain.  + pedal pulses.  No distress noted.

## 2012-11-13 NOTE — ED Provider Notes (Signed)
History     CSN: [id number]  Arrival date & time 11/13/12  1736   First MD Initiated Contact with Patient 11/13/12 1737      Chief Complaint  Patient presents with  . Leg Pain    (Consider location/radiation/quality/duration/timing/severity/associated sxs/prior treatment) Patient is a 78 y.o. female presenting with leg pain. The history is provided by the patient.  Leg Pain  The incident occurred 3 to 5 hours ago. The incident occurred at home. There was no injury mechanism. The pain is present in the right leg. The quality of the pain is described as aching. The pain is at a severity of 4/10. The pain is moderate. The pain has been constant since onset. Associated symptoms include inability to bear weight. Pertinent negatives include no numbness, no loss of motion, no muscle weakness, no loss of sensation and no tingling. She reports no foreign bodies present. The symptoms are aggravated by bearing weight. She has tried nothing for the symptoms.    Past Medical History  Diagnosis Date  . HTN (hypertension)   . Permanent atrial fibrillation   . Bradycardia     s/p PPM  . Hypothyroidism   . Anxiety   . Diverticulitis     history of  . History of hysterectomy   . Long-term (current) use of anticoagulants   . Scoliosis (and kyphoscoliosis), idiopathic     Past Surgical History  Procedure Date  . Pacemaker insertion 03/03/2006    most recent generator (MDT) change 10/30/06 by Dr Reyes Ivan  . Cardiac catheterization 01/08/1999    normal LV function  . Cardioversion 10/30/2006    Successful elective DC cardioversion  . Cardioversion 12/29/2002    Successful DC cardioversion  . Cardioversion 12/03/1999    Successful DC cardioversion  . Cholecystectomy     Family History  Problem Relation Age of Onset  . Coronary artery disease Neg Hx     History  Substance Use Topics  . Smoking status: Never Smoker   . Smokeless tobacco: Not on file  . Alcohol Use: No    OB  History    Grav Para Term Preterm Abortions TAB SAB Ect Mult Living                  Review of Systems  Constitutional: Negative for fever and chills.  Respiratory: Negative for cough and shortness of breath.   Neurological: Negative for tingling and numbness.  All other systems reviewed and are negative.    Allergies  Betapace  Home Medications   Current Outpatient Rx  Name  Route  Sig  Dispense  Refill  . CALCIUM-VITAMIN D 600-200 MG-UNIT PO CAPS   Oral   Take 1 tablet by mouth daily.          . FUROSEMIDE 20 MG PO TABS   Oral   Take 20 mg by mouth daily. Take 20 mg daily for 5 days only.         [name] LEVOTHYROXINE SODIUM 50 MCG PO TABS   Oral   Take 50 mcg by mouth daily.           [name] METOPROLOL SUCCINATE ER 50 MG PO TB24   Oral   Take 1 tablet (50 mg total) by mouth 2 (two) times daily. Take with or immediately following a meal.   180 tablet   3     Dispense as written.   . ADULT MULTIVITAMIN W/MINERALS CH   Oral   Take 1 tablet by mouth  daily.         . WARFARIN SODIUM 5 MG PO TABS   Oral   Take 5-7.5 mg by mouth See admin instructions. Take 1 tablet on Mon and Fri. Take 1.5 tablets (7.5mg )  on Tuesday, Wednesday, Thursday, Saturday and Sunday.         . ALPRAZOLAM 0.25 MG PO TABS   Oral   Take 0.25 mg by mouth 3 (three) times daily as needed. For anxiety           BP 163/62  Pulse 82  Temp 98.1 F (36.7 C) (Oral)  Resp 20  SpO2 98%  Physical Exam  Nursing note and vitals reviewed. Constitutional: She is oriented to person, place, and time. She appears well-developed and well-nourished. No distress.  HENT:  Head: Normocephalic and atraumatic.  Eyes: EOM are normal. Pupils are equal, round, and reactive to light.  Neck: Normal range of motion. Neck supple.  Cardiovascular: Normal rate and regular rhythm.  Exam reveals no friction rub.   No murmur heard. Pulmonary/Chest: Effort normal and breath sounds normal. No respiratory distress.  She has no wheezes. She has no rales.  Abdominal: Soft. She exhibits no distension. There is no tenderness. There is no rebound.  Musculoskeletal: Normal range of motion. She exhibits no edema.       Pedal pulses present  Neurological: She is alert and oriented to person, place, and time.  Skin: She is not diaphoretic.    ED Course  Procedures (including critical care time)  Labs Reviewed  CBC - Abnormal; Notable for the following:    WBC 3.3 (*)     RBC 3.77 (*)     Hemoglobin 11.8 (*)     HCT 35.7 (*)     Platelets 109 (*)  PLATELET COUNT CONFIRMED BY SMEAR   All other components within normal limits  BASIC METABOLIC PANEL - Abnormal; Notable for the following:    Glucose, Bld 103 (*)     GFR calc non Af Amer 80 (*)     All other components within normal limits  PROTIME-INR - Abnormal; Notable for the following:    Prothrombin Time 19.0 (*)     INR 1.65 (*)     All other components within normal limits  POCT I-STAT, CHEM 8  APTT  HEPARIN LEVEL (UNFRACTIONATED)  BASIC METABOLIC PANEL  CBC  PROTIME-INR   No results found.   1. Edema   2. Deep venous thrombosis of lower leg   3. Atrial fibrillation   4. Current use of long term anticoagulation   5. Hypertension   6. Pacemaker   7. Pancytopenia   8. Subtherapeutic international normalized ratio (INR)   9. Tachycardia-bradycardia syndrome       MDM   52F with hx of Afib on coumadin presents with R leg pain. No injury, patient was walking and began to feel R calf pain. Patient with no history of DVT/PE. Full ROM of all joints, pedal pulses present - no pallor. Mild swelling of R calf. Concern for possible DVT - no concern for acute clot or fracture.  Vascular tech at bedside after [place] states R sided DVT.  Heparin initiated, internal medicine admitting.    Elwin Mocha, MD 11/14/12 [phone]

## 2012-11-13 NOTE — Progress Notes (Signed)
ANTICOAGULATION CONSULT NOTE - Initial Consult  Pharmacy Consult for Heparin Indication: DVT  Allergies  Allergen Reactions  . Betapace (Sotalol Hcl)     unknown    Patient Measurements: Height: 4\' 10"  (147.3 cm) Weight: 109 lb (49.442 kg) IBW/kg (Calculated) : 40.9  Heparin Dosing Weight: 49 Kg  Vital Signs: Temp: 98.1 F (36.7 C) (12/14 1742) Temp src: Oral (12/14 1742) BP: 163/62 mmHg (12/14 1742) Pulse Rate: 82  (12/14 1742)  Labs:  Basename 11/13/12 1830 11/13/12 1814  HGB 12.6 11.8*  HCT 37.0 35.7*  PLT -- 109*  APTT -- --  LABPROT -- --  INR -- --  HEPARINUNFRC -- --  CREATININE 0.70 0.56  CKTOTAL -- --  CKMB -- --  TROPONINI -- --    The CrCl is unknown because both a height and weight (above a minimum accepted value) are required for this calculation.   Medical History: Past Medical History  Diagnosis Date  . HTN (hypertension)   . Permanent atrial fibrillation   . Bradycardia     s/p PPM  . Hypothyroidism   . Anxiety   . Diverticulitis     history of  . History of hysterectomy   . Long-term (current) use of anticoagulants   . Scoliosis (and kyphoscoliosis), idiopathic     Medications:  Alprazolam, calcium, lasix, synthroid, toprol, MVI, Coumadin 5mg  Mon/Fri and 7.5mg  all other days.  Assessment: Ms. Berndt is admitted with DVT. Noted she on Coumadin PTA for hx of Afib, and is s/p recent hospital admit for pacer generator change. H/H is low nml, Plts are low at baseline. Scr is nml at baseline. Patient took her last dose of Coumadin today. Noted INR is subtherapeutic at 1.65 today.  Goal of Therapy:  Heparin level 0.3-0.7 units/ml Monitor platelets by anticoagulation protocol: Yes   Plan:  - No heparin bolus with INR  > or + 1.5 - Will start IV heparin infusion at 900 units/hr - Check heparin level at 0500 tomorrow, then daily heparin level and CBC. - Will f/up PT/INR at baseline, will check daily PT/INR  Thanks, Towanda Hornstein K. Allena Katz,  PharmD, BCPS.  Clinical Pharmacist Pager [phone]. 11/13/2012 8:25 PM

## 2012-11-13 NOTE — ED Notes (Signed)
Pt okayed by [facility] to take her 5pm dose of coumadin

## 2012-11-13 NOTE — H&P (Signed)
[facility] History and Physical  Christy Miranda ZDG:[id number] DOB: [birth year] DOA: 11/13/2012  Referring physician: EDP PCP: Emeterio Reeve, MD  Specialists: [facility]  Chief Complaint: Painful Right leg  HPI: Christy Miranda is a 78 y.o. female who presents to the ED with complaints of sudden excruciating pain in her right lower leg this afternoon.  The pain was unremitting  So she was brought to the ED.  She reports that she has varicose veins and always has edema in her legs.  She denies having Chest pain or SOB.  She has been on long-term coumadin therapy for a history of atrial fibrillation, and tachy-brady syndrome, and had her pacemaker replaced 5 days ago.   She had to go off of her coumadin for the placement.   And afterward she restarted the coumadin therapy.  She was evaluated and found to have a DVT on the Venous Doppler that was performed of her RLE, and her PT/INR was also found to be sub-therapeutic at 1.65.     Review of Systems: The patient denies anorexia, fever, weight loss, vision loss, decreased hearing, hoarseness, chest pain, syncope, dyspnea on exertion, peripheral edema, balance deficits, hemoptysis, abdominal pain, melena, hematochezia, severe indigestion/heartburn, hematuria, incontinence, dysuria, muscle weakness, suspicious skin lesions, transient blindness, difficulty walking, depression, unusual weight change, abnormal bleeding, enlarged lymph nodes, angioedema, and breast masses.    Past Medical History  Diagnosis Date  . HTN (hypertension)   . Permanent atrial fibrillation   . Bradycardia     s/p PPM  . Hypothyroidism   . Anxiety   . Diverticulitis     history of  . History of hysterectomy   . Long-term (current) use of anticoagulants   . Scoliosis (and kyphoscoliosis), idiopathic    Past Surgical History  Procedure Date  . Pacemaker insertion 03/03/2006    most recent generator (MDT) change 10/30/06 by Dr Reyes Ivan  . Cardiac  catheterization 01/08/1999    normal LV function  . Cardioversion 10/30/2006    Successful elective DC cardioversion  . Cardioversion 12/29/2002    Successful DC cardioversion  . Cardioversion 12/03/1999    Successful DC cardioversion  . Cholecystectomy      Medications:  HOME MEDS: Prior to Admission medications   Medication Sig Start Date End Date Taking? Authorizing Provider  ALPRAZolam (XANAX) 0.25 MG tablet Take 0.25 mg by mouth 3 (three) times daily as needed. For anxiety   Yes Historical Provider, MD  Calcium Carbonate-Vitamin D (CALCIUM-VITAMIN D) 600-200 MG-UNIT CAPS Take 1 tablet by mouth daily.    Yes Historical Provider, MD  furosemide (LASIX) 20 MG tablet Take 20 mg by mouth daily. Take 20 mg daily for 5 days only. 11/09/12 11/14/12 Yes Peter M [place], MD  levothyroxine (SYNTHROID, LEVOTHROID) 50 MCG tablet Take 50 mcg by mouth daily.     Yes Historical Provider, MD  metoprolol succinate (TOPROL-XL) 50 MG 24 hr tablet Take 1 tablet (50 mg total) by mouth 2 (two) times daily. Take with or immediately following a meal. 08/19/12  Yes Peter M [place], MD  Multiple Vitamin (MULTIVITAMIN WITH MINERALS) TABS Take 1 tablet by mouth daily.   Yes Historical Provider, MD  warfarin (COUMADIN) 5 MG tablet Take 5-7.5 mg by mouth See admin instructions. Take 1 tablet on Mon and Fri. Take 1.5 tablets (7.5mg )  on Tuesday, Wednesday, Thursday, Saturday and Sunday.   Yes Historical Provider, MD    Allergies:  Allergies  Allergen Reactions  . Betapace (Sotalol Hcl)  unknown    Social History: Lives Alone, and She is Able to perform all of her ADLs, She does not drive and reports She never did.     reports that she has never smoked. She does not have any smokeless tobacco history on file. She reports that she does not drink alcohol or use illicit drugs.   Family History: Family History  Problem Relation Age of Onset  . Coronary artery disease Mother   . Cancer - Other Father      Pancreatic Cancer     Physical Exam:  GEN:  Pleasant Elderly 78 year old well nourished and well developed Caucasian Female examined  and in no acute distress; cooperative with exam Filed Vitals:   11/13/12 1742 11/13/12 2000  BP: 163/62   Pulse: 82   Temp: 98.1 F (36.7 C)   TempSrc: Oral   Resp: 20   Height:  4\' 10"  (1.473 m)  Weight:  49.442 kg (109 lb)  SpO2: 98%    Blood pressure 163/62, pulse 82, temperature 98.1 F (36.7 C), temperature source Oral, resp. rate 20, height 4\' 10"  (1.473 m), weight 49.442 kg (109 lb), SpO2 98.00%. PSYCH: She is alert and oriented x4; does not appear anxious does not appear depressed; affect is normal HEENT: Normocephalic and Atraumatic, Mucous membranes pink; PERRLA; EOM intact; Fundi:  Benign;  No scleral icterus, Nares: Patent, Oropharynx: Clear, Fair Dentition, Neck:  FROM, no cervical lymphadenopathy nor thyromegaly or carotid bruit; no JVD; Breasts:: Not examined CHEST WALL: No tenderness CHEST: Normal respiration, clear to auscultation bilaterally HEART: Regular rate and rhythm; no murmurs rubs or gallops BACK: No kyphosis or scoliosis; no CVA tenderness ABDOMEN: Positive Bowel Sounds, soft non-tender; no masses, no organomegaly. Rectal Exam: Not done EXTREMITIES: No bone or joint deformity; age-appropriate arthropathy of the hands and knees; no cyanosis, clubbing, 2+EDEMA BLE; no ulcerations. No Homan's Sign.   Genitalia: not examined PULSES: 2+ and symmetric SKIN: Normal hydration no rash or ulceration CNS: Cranial nerves 2-12 grossly intact no focal neurologic deficit    Labs on Admission:  Basic Metabolic Panel:  Lab 11/13/12 0865 11/13/12 1814  NA 138 135  K 4.3 4.4  CL 101 100  CO2 -- 28  GLUCOSE 99 103*  BUN 14 14  CREATININE 0.70 0.56  CALCIUM -- 9.1  MG -- --  PHOS -- --   Liver Function Tests: No results found for this basename: AST:5,ALT:5,ALKPHOS:5,BILITOT:5,PROT:5,ALBUMIN:5 in the last 168 hours No results  found for this basename: LIPASE:5,AMYLASE:5 in the last 168 hours No results found for this basename: AMMONIA:5 in the last 168 hours CBC:  Lab 11/13/12 1830 11/13/12 1814  WBC -- 3.3*  NEUTROABS -- --  HGB 12.6 11.8*  HCT 37.0 35.7*  MCV -- 94.7  PLT -- 109*   Cardiac Enzymes: No results found for this basename: CKTOTAL:5,CKMB:5,CKMBINDEX:5,TROPONINI:5 in the last 168 hours  BNP (last 3 results) No results found for this basename: PROBNP:3 in the last 8760 hours CBG: No results found for this basename: GLUCAP:5 in the last 168 hours  Radiological Exams on Admission: No results found.  EKG: Independently reviewed.   Assessment/Plan Principal Problem:  *Deep venous thrombosis of lower leg Active Problems:  Atrial fibrillation  Tachycardia-bradycardia syndrome  Hypertension  Current use of long term anticoagulation  Pacemaker-Medtronic  Edema  Subtherapeutic international normalized ratio (INR)  Pancytopenia   Plan:       Admit to Telemetry Bed IV Heparin Drip Per Pharmacy management Monitor PLTs, Check  PT/INR Consult [facility] Per Patient Request for Medication Options coumadin versus the Newer Agents Reconcile Home Medications GI prophylaxis     Code Status:  FULL CODE Family Communication:  Daughter at Bedside Disposition Plan:  Return to Home  Time spent: 43 Minutes  Ron Parker [facility] Pager [phone]  If 7PM-7AM, please contact night-coverage [email or web address] Password [facility] 11/13/2012, 8:56 PM

## 2012-11-14 DIAGNOSIS — I824Z9 Acute embolism and thrombosis of unspecified deep veins of unspecified distal lower extremity: Secondary | ICD-10-CM | POA: Diagnosis not present

## 2012-11-14 DIAGNOSIS — I495 Sick sinus syndrome: Secondary | ICD-10-CM | POA: Diagnosis not present

## 2012-11-14 DIAGNOSIS — E039 Hypothyroidism, unspecified: Secondary | ICD-10-CM | POA: Diagnosis present

## 2012-11-14 DIAGNOSIS — Z8249 Family history of ischemic heart disease and other diseases of the circulatory system: Secondary | ICD-10-CM | POA: Diagnosis not present

## 2012-11-14 DIAGNOSIS — F411 Generalized anxiety disorder: Secondary | ICD-10-CM | POA: Diagnosis present

## 2012-11-14 DIAGNOSIS — D61818 Other pancytopenia: Secondary | ICD-10-CM | POA: Diagnosis present

## 2012-11-14 DIAGNOSIS — Z7901 Long term (current) use of anticoagulants: Secondary | ICD-10-CM | POA: Diagnosis not present

## 2012-11-14 DIAGNOSIS — I4891 Unspecified atrial fibrillation: Secondary | ICD-10-CM | POA: Diagnosis not present

## 2012-11-14 DIAGNOSIS — I10 Essential (primary) hypertension: Secondary | ICD-10-CM | POA: Diagnosis not present

## 2012-11-14 DIAGNOSIS — Z950 Presence of cardiac pacemaker: Secondary | ICD-10-CM | POA: Diagnosis not present

## 2012-11-14 DIAGNOSIS — D696 Thrombocytopenia, unspecified: Secondary | ICD-10-CM | POA: Diagnosis not present

## 2012-11-14 DIAGNOSIS — Z79899 Other long term (current) drug therapy: Secondary | ICD-10-CM | POA: Diagnosis not present

## 2012-11-14 LAB — BASIC METABOLIC PANEL
BUN: 11 mg/dL (ref 6–23)
CO2: 25 mEq/L (ref 19–32)
Calcium: 8.8 mg/dL (ref 8.4–10.5)
Chloride: 105 mEq/L (ref 96–112)
Creatinine, Ser: 0.52 mg/dL (ref 0.50–1.10)
Glucose, Bld: 94 mg/dL (ref 70–99)

## 2012-11-14 LAB — CBC
HCT: 35.6 % — ABNORMAL LOW (ref 36.0–46.0)
MCH: 31.2 pg (ref 26.0–34.0)
MCHC: 33.1 g/dL (ref 30.0–36.0)
MCV: 94.2 fL (ref 78.0–100.0)
Platelets: 113 10*3/uL — ABNORMAL LOW (ref 150–400)
RDW: 15.3 % (ref 11.5–15.5)

## 2012-11-14 MED ORDER — PATIENT'S GUIDE TO USING COUMADIN BOOK
Freq: Once | Status: DC
  Filled 2012-11-14: qty 1

## 2012-11-14 MED ORDER — ENOXAPARIN SODIUM 60 MG/0.6ML ~~LOC~~ SOLN
50.0000 mg | SUBCUTANEOUS | Status: DC
  Administered 2012-11-14 – 2012-11-16 (×3): 50 mg via SUBCUTANEOUS
  Filled 2012-11-14 (×3): qty 0.6

## 2012-11-14 MED ORDER — WARFARIN SODIUM 7.5 MG PO TABS
7.5000 mg | ORAL_TABLET | Freq: Once | ORAL | Status: AC
  Administered 2012-11-14: 7.5 mg via ORAL
  Filled 2012-11-14: qty 1

## 2012-11-14 MED ORDER — WARFARIN - PHARMACIST DOSING INPATIENT
Freq: Every day | Status: DC
  Administered 2012-11-15: 18:00:00

## 2012-11-14 MED ORDER — WARFARIN VIDEO: Freq: Once | Status: DC

## 2012-11-14 NOTE — Progress Notes (Signed)
[facility] PROGRESS NOTE  Christy Miranda RUE:[id number] DOB: 02-05-1921 DOA: 11/13/2012 PCP: Emeterio Reeve, MD  HPI/Subjective: Feels much better   Assessment/Plan:   Acute DVT -Involving the right lower extremity , patient is already on Coumadin but she was subtherapeutic. -INR was subtherapeutic, this is not Coumadin failure. Started on heparin drip and Coumadin. -I will start on Lovenox and discontinue heparin, which with Lovenox until INR is > 2.0, continue Coumadin.  Atrial fibrillation -Rate controlled with metoprolol, she is on Coumadin. -Coumadin currently subtherapeutic but she is on full anticoagulation.  Tachybradycardia syndrome -Patient has permanent pacer. This is been replaced recently by Dr. Johney Frame.  HTN -Controlled.  Code Status: Full code Family Communication:  Disposition Plan: Discharge in a.m. likely   Consultants:  None  Procedures:  Doppler ultrasound of right lower extremity done on 11/13/2012 showed acute DVT.  Antibiotics:  None   Objective: Filed Vitals:   11/13/12 2100 11/13/12 2200 11/14/12 0202 11/14/12 0527  BP: 148/68 140/64 131/55 132/66  Pulse: 78 82 72 83  Temp:  98.1 F (36.7 C) 98.1 F (36.7 C) 98.1 F (36.7 C)  TempSrc:  Oral Oral Oral  Resp: 15 18 18 18   Height:  4\' 10"  (1.473 m)    Weight:  48.3 kg (106 lb 7.7 oz)    SpO2: 97% 98% 96% 95%    Intake/Output Summary (Last 24 hours) at 11/14/12 1017 Last data filed at 11/14/12 1015  Gross per 24 hour  Intake      0 ml  Output   1550 ml  Net  -1550 ml   Filed Weights   11/13/12 2000 11/13/12 2200  Weight: 49.442 kg (109 lb) 48.3 kg (106 lb 7.7 oz)    Exam:  General: Alert and awake, oriented x3, not in any acute distress. HEENT: anicteric sclera, pupils reactive to light and accommodation, EOMI CVS: S1-S2 clear, no murmur rubs or gallops Chest: clear to auscultation bilaterally, no wheezing, rales or rhonchi Abdomen: soft nontender,  nondistended, normal bowel sounds, no organomegaly Extremities: no cyanosis, clubbing or edema noted bilaterally Neuro: Cranial nerves II-XII intact, no focal neurological deficits  Data Reviewed: Basic Metabolic Panel:  Lab 11/14/12 1478 11/13/12 1830 11/13/12 1814  NA 139 138 135  K 4.0 4.3 4.4  CL 105 101 100  CO2 25 -- 28  GLUCOSE 94 99 103*  BUN 11 14 14   CREATININE 0.52 0.70 0.56  CALCIUM 8.8 -- 9.1  MG -- -- --  PHOS -- -- --   Liver Function Tests: No results found for this basename: AST:5,ALT:5,ALKPHOS:5,BILITOT:5,PROT:5,ALBUMIN:5 in the last 168 hours No results found for this basename: LIPASE:5,AMYLASE:5 in the last 168 hours No results found for this basename: AMMONIA:5 in the last 168 hours CBC:  Lab 11/14/12 0620 11/13/12 1830 11/13/12 1814  WBC 3.8* -- 3.3*  NEUTROABS -- -- --  HGB 11.8* 12.6 11.8*  HCT 35.6* 37.0 35.7*  MCV 94.2 -- 94.7  PLT 113* -- 109*   Cardiac Enzymes: No results found for this basename: CKTOTAL:5,CKMB:5,CKMBINDEX:5,TROPONINI:5 in the last 168 hours BNP (last 3 results) No results found for this basename: PROBNP:3 in the last 8760 hours CBG: No results found for this basename: GLUCAP:5 in the last 168 hours  Recent Results (from the past 240 hour(s))  SURGICAL PCR SCREEN     Status: Normal   Collection Time   11/09/12  3:07 PM      Component Value Range Status Comment   MRSA, PCR NEGATIVE  NEGATIVE  Final    Staphylococcus aureus NEGATIVE  NEGATIVE Final      Studies: No results found.  Scheduled Meds:   . calcium-vitamin D  1 tablet Oral Daily  . furosemide  20 mg Oral Daily  . levothyroxine  50 mcg Oral QAC breakfast  . metoprolol succinate  50 mg Oral BID  . multivitamin with minerals  1 tablet Oral Daily  . pantoprazole  20 mg Oral Daily  . sodium chloride  3 mL Intravenous Q12H   Continuous Infusions:   . sodium chloride 75 mL/hr at 11/14/12 0439  . heparin 1,000 Units/hr (11/14/12 0738)    Principal  Problem:  *Deep venous thrombosis of lower leg Active Problems:  Atrial fibrillation  Tachycardia-bradycardia syndrome  Hypertension  Current use of long term anticoagulation  Pacemaker-Medtronic  Edema  Subtherapeutic international normalized ratio (INR)  Pancytopenia    Time spent: 35 minutes    [facility]  [facility] Pager [phone]. If 8PM-8AM, please contact night-coverage at [email or web address], password [facility] 11/14/2012, 10:17 AM  LOS: 1 day

## 2012-11-14 NOTE — Progress Notes (Signed)
Patient arrived from emergency room via stretcher, alert and oriented x 4, pain rated 4/10 in right calf.  Assisted to bed, placed on telemetry, 5506, patient running afib.  Patient recently had her third pacemaker inserted in right upper chest last week.  Steristrips over incision with no redness present, old dried blood.  Patient lives alone and has family that she can call upon if necessary.  Patient has a dvt in right lower leg.  Patient on heparin drip at 9cc/hr, Normal saline at 75cc/hr.  IV in left hand patent with no redness or swelling.  Patient requires assistance to bedside commode, she is to put no weight on the right arm due to pacemaker placement..  Skin intact, call bell within reach.  Past medical history includes: Past Medical History   Diagnosis  Date   .  HTN (hypertension)    .  Permanent atrial fibrillation    .  Bradycardia      s/p PPM   .  Hypothyroidism    .  Anxiety    .  Diverticulitis      history of   .  History of hysterectomy    .  Long-term (current) use of anticoagulants    .  Scoliosis (and kyphoscoliosis), idiopathic     Past Surgical History   Procedure  Date   .  Pacemaker insertion  03/03/2006     most recent generator (MDT) change 10/30/06 by Dr Reyes Ivan   .  Cardiac catheterization  01/08/1999     normal LV function   .  Cardioversion  10/30/2006     Successful elective DC cardioversion   .  Cardioversion  12/29/2002     Successful DC cardioversion   .  Cardioversion  12/03/1999     Successful DC cardioversion   .  Cholecystectomy      Will continue to monitor.  Macarthur Critchley, RN

## 2012-11-14 NOTE — Progress Notes (Addendum)
ANTICOAGULATION CONSULT NOTE  Pharmacy Consult for Lovenox/Coumadin Indication: DVT  Allergies  Allergen Reactions  . Betapace (Sotalol Hcl)     unknown    Patient Measurements: Height: 4\' 10"  (147.3 cm) Weight: 106 lb 7.7 oz (48.3 kg) IBW/kg (Calculated) : 40.9    Vital Signs: Temp: 98.1 F (36.7 C) (12/15 0527) Temp src: Oral (12/15 0527) BP: 132/66 mmHg (12/15 0527) Pulse Rate: 83  (12/15 0527)  Labs:  Basename 11/14/12 0620 11/13/12 2014 11/13/12 1830 11/13/12 1814  HGB 11.8* -- 12.6 --  HCT 35.6* -- 37.0 35.7*  PLT 113* -- -- 109*  APTT -- 32 -- --  LABPROT 19.0* 19.0* -- --  INR 1.65* 1.65* -- --  HEPARINUNFRC 0.21* -- -- --  CREATININE 0.52 -- 0.70 0.56  CKTOTAL -- -- -- --  CKMB -- -- -- --  TROPONINI -- -- -- --    Estimated Creatinine Clearance: 30.8 ml/min (by C-G formula based on Cr of 0.52).  Assessment: 78 yo female admitted with DVT, h/o Afib, INR subtherapeutic followiing PPM placement, Heparin started this AM, now changing to Lovenox bridge to coumadin. Pt's CrCl calculates to 31, but due to age and small size expect this is likely even worse and will dose accordingly. Home Coumadin dose is 7.5mg  daily, except 5mg  on Mon and Fri. INR today is 1.65 (last dose 12/14 AM)   Goal of Therapy:  INR 2-3 Heparin level 0.3-0.7 units/ml Monitor platelets by anticoagulation protocol: Yes   Plan:  - Stop Heparin drip 1 hr prior to starting enoxaparin - Start enoxaparin 50 units q24h (1unit/kg q24h) - Restart warfarin 7.5mg  x1 dose - Monitor daily INR and CBC  Vania Rea. Darin Engels.D. Clinical Pharmacist Pager [phone] Phone [phone] 11/14/2012 10:49 AM

## 2012-11-14 NOTE — Progress Notes (Signed)
ANTICOAGULATION CONSULT NOTE  Pharmacy Consult for Heparin Indication: DVT  Allergies  Allergen Reactions  . Betapace (Sotalol Hcl)     unknown    Patient Measurements: Height: 4\' 10"  (147.3 cm) Weight: 106 lb 7.7 oz (48.3 kg) IBW/kg (Calculated) : 40.9  Heparin Dosing Weight: 49 Kg  Vital Signs: Temp: 98.1 F (36.7 C) (12/15 0527) Temp src: Oral (12/15 0527) BP: 132/66 mmHg (12/15 0527) Pulse Rate: 83  (12/15 0527)  Labs:  Basename 11/14/12 0620 11/13/12 2014 11/13/12 1830 11/13/12 1814  HGB 11.8* -- 12.6 --  HCT 35.6* -- 37.0 35.7*  PLT 113* -- -- 109*  APTT -- 32 -- --  LABPROT 19.0* 19.0* -- --  INR 1.65* 1.65* -- --  HEPARINUNFRC 0.21* -- -- --  CREATININE -- -- 0.70 0.56  CKTOTAL -- -- -- --  CKMB -- -- -- --  TROPONINI -- -- -- --    Estimated Creatinine Clearance: 30.8 ml/min (by C-G formula based on Cr of 0.7).  Assessment: 78 yo female admitted with DVT, h/o Afib, INR subtherapeutic followiing PPM placement, for Heparin   Goal of Therapy:  Heparin level 0.3-0.7 units/ml Monitor platelets by anticoagulation protocol: Yes   Plan:  Increase Heparin 1000 units/hr Check heparin level in 6 hours.  Geannie Risen, PharmD, BCPS 11/14/2012 7:02 AM

## 2012-11-15 LAB — PROTIME-INR
INR: 2.06 — ABNORMAL HIGH (ref 0.00–1.49)
Prothrombin Time: 22.4 seconds — ABNORMAL HIGH (ref 11.6–15.2)

## 2012-11-15 LAB — CBC
HCT: 35.2 % — ABNORMAL LOW (ref 36.0–46.0)
Hemoglobin: 11.7 g/dL — ABNORMAL LOW (ref 12.0–15.0)
MCHC: 33.2 g/dL (ref 30.0–36.0)
RBC: 3.73 MIL/uL — ABNORMAL LOW (ref 3.87–5.11)

## 2012-11-15 MED ORDER — WARFARIN SODIUM 5 MG PO TABS
5.0000 mg | ORAL_TABLET | Freq: Once | ORAL | Status: AC
  Administered 2012-11-15: 5 mg via ORAL
  Filled 2012-11-15: qty 1

## 2012-11-15 NOTE — Progress Notes (Signed)
[facility] PROGRESS NOTE  Christy Miranda ZOX:[id number] DOB: 1921-10-17 DOA: 11/13/2012 PCP: Emeterio Reeve, MD  HPI/Subjective: Complains about dizziness, less pain in her leg.   Assessment/Plan:   Acute DVT -Involving the right lower extremity , patient is already on Coumadin but she was subtherapeutic. -INR was subtherapeutic, this is not Coumadin failure. Started on heparin drip and Coumadin. -I will start on Lovenox and discontinue heparin, which with Lovenox until INR is > 2.0, continue Coumadin. -Continue Lovenox for today, she might not need it on D/C.  Atrial fibrillation -Rate controlled with metoprolol, she is on Coumadin. -Coumadin currently subtherapeutic but she is on full anticoagulation.  Tachybradycardia syndrome -Patient has permanent pacer. This is been replaced recently by Dr. Johney Frame.  HTN -Controlled.  Code Status: Full code Family Communication:  Disposition Plan: Discharge in a.m. likely   Consultants:  None  Procedures:  Doppler ultrasound of right lower extremity done on 11/13/2012 showed acute DVT.  Antibiotics:  None   Objective: Filed Vitals:   11/15/12 0931 11/15/12 0955 11/15/12 1142 11/15/12 1309  BP: 126/65 129/56  117/56  Pulse: 84 92  94  Temp: 97.5 F (36.4 C)   98.2 F (36.8 C)  TempSrc: Oral   Oral  Resp: 20   18  Height:      Weight:      SpO2: 97%  96% 97%    Intake/Output Summary (Last 24 hours) at 11/15/12 1504 Last data filed at 11/15/12 1310  Gross per 24 hour  Intake   2836 ml  Output   1670 ml  Net   1166 ml   Filed Weights   11/13/12 2000 11/13/12 2200  Weight: 49.442 kg (109 lb) 48.3 kg (106 lb 7.7 oz)    Exam:  General: Alert and awake, oriented x3, not in any acute distress. HEENT: anicteric sclera, pupils reactive to light and accommodation, EOMI CVS: S1-S2 clear, no murmur rubs or gallops Chest: clear to auscultation bilaterally, no wheezing, rales or rhonchi Abdomen: soft  nontender, nondistended, normal bowel sounds, no organomegaly Extremities: no cyanosis, clubbing or edema noted bilaterally Neuro: Cranial nerves II-XII intact, no focal neurological deficits  Data Reviewed: Basic Metabolic Panel:  Lab 11/14/12 8119 11/13/12 1830 11/13/12 1814  NA 139 138 135  K 4.0 4.3 4.4  CL 105 101 100  CO2 25 -- 28  GLUCOSE 94 99 103*  BUN 11 14 14   CREATININE 0.52 0.70 0.56  CALCIUM 8.8 -- 9.1  MG -- -- --  PHOS -- -- --   Liver Function Tests: No results found for this basename: AST:5,ALT:5,ALKPHOS:5,BILITOT:5,PROT:5,ALBUMIN:5 in the last 168 hours No results found for this basename: LIPASE:5,AMYLASE:5 in the last 168 hours No results found for this basename: AMMONIA:5 in the last 168 hours CBC:  Lab 11/15/12 0500 11/14/12 0620 11/13/12 1830 11/13/12 1814  WBC 4.6 3.8* -- 3.3*  NEUTROABS -- -- -- --  HGB 11.7* 11.8* 12.6 11.8*  HCT 35.2* 35.6* 37.0 35.7*  MCV 94.4 94.2 -- 94.7  PLT 109* 113* -- 109*   Cardiac Enzymes: No results found for this basename: CKTOTAL:5,CKMB:5,CKMBINDEX:5,TROPONINI:5 in the last 168 hours BNP (last 3 results) No results found for this basename: PROBNP:3 in the last 8760 hours CBG: No results found for this basename: GLUCAP:5 in the last 168 hours  Recent Results (from the past 240 hour(s))  SURGICAL PCR SCREEN     Status: Normal   Collection Time   11/09/12  3:07 PM      Component  Value Range Status Comment   MRSA, PCR NEGATIVE  NEGATIVE Final    Staphylococcus aureus NEGATIVE  NEGATIVE Final      Studies: No results found.  Scheduled Meds:    . calcium-vitamin D  1 tablet Oral Daily  . enoxaparin (LOVENOX) injection  50 mg Subcutaneous Q24H  . furosemide  20 mg Oral Daily  . levothyroxine  50 mcg Oral QAC breakfast  . metoprolol succinate  50 mg Oral BID  . multivitamin with minerals  1 tablet Oral Daily  . pantoprazole  20 mg Oral Daily  . patient's guide to using coumadin book   Does not apply Once  .  sodium chloride  3 mL Intravenous Q12H  . warfarin  5 mg Oral ONCE-1800  . warfarin   Does not apply Once  . Warfarin - Pharmacist Dosing Inpatient   Does not apply q1800   Continuous Infusions:    . sodium chloride 75 mL/hr at 11/15/12 1151    Principal Problem:  *Deep venous thrombosis of lower leg Active Problems:  Atrial fibrillation  Tachycardia-bradycardia syndrome  Hypertension  Current use of long term anticoagulation  Pacemaker-Medtronic  Edema  Subtherapeutic international normalized ratio (INR)  Pancytopenia    Time spent: 35 minutes    [facility] A  [facility] Pager [phone]. If 8PM-8AM, please contact night-coverage at [email or web address], password [facility] 11/15/2012, 3:04 PM  LOS: 2 days

## 2012-11-15 NOTE — Clinical Social Work Psychosocial (Signed)
     Clinical Social Work Department BRIEF PSYCHOSOCIAL ASSESSMENT 11/15/2012  Patient:  Christy Miranda, Christy Miranda     Account Number:  [id number]     Admit date:  11/13/2012  Clinical Social Worker:  Robin Searing  Date/Time:  11/15/2012 11:45 AM  Referred by:  Physician  Date Referred:  11/15/2012 Referred for  SNF Placement   Other Referral:   Interview type:  Patient Other interview type:    PSYCHOSOCIAL DATA Living Status:  ALONE Admitted from facility:   Level of care:   Primary support name:  daughter Primary support relationship to patient:  FAMILY Degree of support available:   good    CURRENT CONCERNS Current Concerns  Post-Acute Placement   Other Concerns:   Patient reported to be tearful/?depressed per RN    SOCIAL WORK ASSESSMENT / PLAN Patient sitting in chair- reading and appears to be in good spirits- discussed with her the CSW role and the possible needs at d/c inclusing ?SNF- she is hopeful to go home at d/c- has holiday plans with her family and hopes she will be ok for home. PT consulted and we will await thir eval/recommnedations.    Patient shared with me that yesterday was the 5th anniversary of her husband's death- she was likely grieving and saddened when observed to be tearful- remembering this as she is now hopeful, happy and motrivated to get home.   Assessment/plan status:  Other - See comment Other assessment/ plan:   Awaiting PT eval   Information/referral to community resources:   SNF  [facility]    PATIENTS/FAMILYS RESPONSE TO PLAN OF CARE: Patient appreciaitve of CSW visit and appears to be quite independent prior to this admission. CSW to follow up after PT sees patient for further d/c planning-

## 2012-11-15 NOTE — Evaluation (Signed)
Physical Therapy Evaluation Patient Details Name: Christy Miranda MRN: [id number] DOB: August 12, 1921 Today's Date: 11/15/2012 Time: 4098-1191 PT Time Calculation (min): 14 min  PT Assessment / Plan / Recommendation Clinical Impression  Pt adm with DVT.  Pt with adequate mobility to return home.  Pt with some baseline balance deficits that could benefit from HHPT.    PT Assessment  All further PT needs can be met in the next venue of care    Follow Up Recommendations  Home health PT    Does the patient have the potential to tolerate intense rehabilitation      Barriers to Discharge        Equipment Recommendations  None recommended by PT    Recommendations for Other Services     Frequency      Precautions / Restrictions Precautions Precautions: Fall   Pertinent Vitals/Pain N/A      Mobility  Transfers Transfers: Sit to Stand;Stand to Sit Sit to Stand: 6: Modified independent (Device/Increase time);With upper extremity assist;With armrests;From chair/3-in-1 Stand to Sit: 6: Modified independent (Device/Increase time);With upper extremity assist;With armrests;To chair/3-in-1 Ambulation/Gait Ambulation/Gait Assistance: 5: Supervision (for equipment) Ambulation Distance (Feet): 150 Feet Assistive device: None Gait Pattern: Trunk flexed;Narrow base of support    Shoulder Instructions     Exercises     PT Diagnosis: Other (comment) (decr balance)  PT Problem List: Decreased balance PT Treatment Interventions:     PT Goals    Visit Information  Last PT Received On: 11/15/12 Assistance Needed: +1    Subjective Data  Subjective: Pt asking if I have seen pts with blood clots before. Patient Stated Goal: Return home   Prior Functioning  Home Living Lives With: Alone Available Help at Discharge: Family;Available PRN/intermittently Type of Home: House Home Access: Stairs to enter [employer] of Steps: 2-3 Entrance Stairs-Rails: Right Home Layout: One  level Home Adaptive Equipment: None Prior Function Level of Independence: Independent Able to Take Stairs?: Yes Driving: No Vocation: Retired Musician: No difficulties    Cognition  Overall Cognitive Status: Appears within functional limits for tasks assessed/performed Arousal/Alertness: Awake/alert Orientation Level: Appears intact for tasks assessed Behavior During Session: [facility] for tasks performed    Extremity/Trunk Assessment Right Lower Extremity Assessment RLE ROM/Strength/Tone: [facility] for tasks assessed Left Lower Extremity Assessment LLE ROM/Strength/Tone: [facility] for tasks assessed   Balance Static Standing Balance Static Standing - Balance Support: No upper extremity supported Static Standing - Level of Assistance: 6: Modified independent (Device/Increase time)  End of Session PT - End of Session Activity Tolerance: Patient tolerated treatment well Patient left: in chair;with call bell/phone within reach Nurse Communication: Mobility status  GP     Murat Rideout 11/15/2012, 12:02 PM  [employer] PT [phone]

## 2012-11-15 NOTE — Care Management Note (Addendum)
    Page 1 of 2   11/16/2012     10:05:16 AM   CARE MANAGEMENT NOTE 11/16/2012  Patient:  Christy Miranda, Christy Miranda   Account Number:  [id number]  Date Initiated:  11/15/2012  Documentation initiated by:  Letha Cape  Subjective/Objective Assessment:   dx dvt  admit- lives alone. Pt's grandson will be her at times.     Action/Plan:   pt eval- recs hhpt   Anticipated DC Date:  11/16/2012   Anticipated DC Plan:  HOME W HOME HEALTH SERVICES      DC Planning Services  CM consult      [facility] Choice  HOME HEALTH   Choice offered to / List presented to:  C-1 Patient        HH arranged  HH-2 PT  HH-1 RN      [facility] agency  [facility]   Status of service:  Completed, signed off Medicare Important Message given?   (If response is "NO", the following Medicare IM given date fields will be blank) Date Medicare IM given:   Date Additional Medicare IM given:    Discharge Disposition:  HOME W HOME HEALTH SERVICES  Per UR Regulation:  Reviewed for med. necessity/level of care/duration of stay  If discussed at Long Length of Stay Meetings, dates discussed:    Comments:  11/16/12 10:03 Letha Cape RN, BSN [phone] patient is for dc today, Kristen with [facility] notified. Patient will need two more days of lovenox injections that the [facility] will need to do for patient, which Debbie with [facility] states this will be no problem since it is just 2 days.  11/15/12 14:23 Letha Cape RN, BSN [phone] patient lives alone, patient states that her grandson will be with her at times.  Per physical therpay recs hhpt, patient chose [facility] from agency list for hhpt and hhrn. Referral given to [facility], Debbie notified.  Soc will begin 24-48 hrs post discharge. Patient may need 1-2 days of lovenox injections which the [facility] can do if only 1-2 days. Per pharmacist patient 's inr is therapeutic so she may only need one day or none  at all.

## 2012-11-15 NOTE — Progress Notes (Signed)
ANTICOAGULATION CONSULT NOTE  Pharmacy Consult for Lovenox/Coumadin Indication: DVT  Allergies  Allergen Reactions  . Betapace (Sotalol Hcl)     unknown   Labs:  Basename 11/15/12 0500 11/14/12 0620 11/13/12 2014 11/13/12 1830 11/13/12 1814  HGB 11.7* 11.8* -- -- --  HCT 35.2* 35.6* -- 37.0 --  PLT 109* 113* -- -- 109*  APTT -- -- 32 -- --  LABPROT 22.4* 19.0* 19.0* -- --  INR 2.06* 1.65* 1.65* -- --  HEPARINUNFRC -- 0.21* -- -- --  CREATININE -- 0.52 -- 0.70 0.56  CKTOTAL -- -- -- -- --  CKMB -- -- -- -- --  TROPONINI -- -- -- -- --    Estimated Creatinine Clearance: 30.8 ml/min (by C-G formula based on Cr of 0.52).  Assessment: 78 yo female admitted with DVT, h/o Afib, INR subtherapeutic followiing PPM placement, Heparin started this AM, now changing to Lovenox bridge to coumadin. Pt's CrCl calculates to 31, but due to age and small size expect this is likely even worse and will dose accordingly.  Home Coumadin dose is 7.5mg  daily, except 5mg  on Mon and Fri.   INR today is 2.06   Goal of Therapy:  INR 2-3 Monitor platelets by anticoagulation protocol: Yes Appropriate Lovenox dosing   Plan:  1) Continue Lovenox through today at 50 mg Q 24 2) Coumadin 5 mg po x 1 dose today 3) Daily INR  Thank you. Okey Regal, PharmD [phone]  11/15/2012 10:36 AM

## 2012-11-16 ENCOUNTER — Telehealth: Payer: Self-pay | Admitting: Cardiology

## 2012-11-16 DIAGNOSIS — D696 Thrombocytopenia, unspecified: Secondary | ICD-10-CM

## 2012-11-16 LAB — CBC
HCT: 34.5 % — ABNORMAL LOW (ref 36.0–46.0)
Hemoglobin: 11.3 g/dL — ABNORMAL LOW (ref 12.0–15.0)
MCHC: 32.8 g/dL (ref 30.0–36.0)
RBC: 3.58 MIL/uL — ABNORMAL LOW (ref 3.87–5.11)
WBC: 4 10*3/uL (ref 4.0–10.5)

## 2012-11-16 LAB — PROTIME-INR
INR: 2.11 — ABNORMAL HIGH (ref 0.00–1.49)
Prothrombin Time: 22.8 seconds — ABNORMAL HIGH (ref 11.6–15.2)

## 2012-11-16 MED ORDER — WARFARIN SODIUM 7.5 MG PO TABS
7.5000 mg | ORAL_TABLET | Freq: Once | ORAL | Status: DC
  Filled 2012-11-16: qty 1

## 2012-11-16 MED ORDER — ENOXAPARIN SODIUM 80 MG/0.8ML ~~LOC~~ SOLN: 80.0000 mg | Freq: Every day | SUBCUTANEOUS | Status: DC

## 2012-11-16 NOTE — Progress Notes (Deleted)
Physician Discharge Summary  [name] ZHY:[id number] DOB: 12/07/1920 DOA: 11/13/2012  PCP: Emeterio Reeve, MD  Admit date: 11/13/2012 Discharge date: 11/16/2012  Time spent: 40 minutes minutes  Recommendations for Outpatient Follow-up:  1. Followup with primary care physician and [place] [facility].  Discharge Diagnoses:  Principal Problem:  *Deep venous thrombosis of lower leg Active Problems:  Atrial fibrillation  Tachycardia-bradycardia syndrome  Hypertension  Current use of long term anticoagulation  Pacemaker-[employer]  Edema  Subtherapeutic international normalized ratio (INR)  Pancytopenia  Thrombocytopenia   Discharge Condition: Stable  Diet recommendation: Heart healthy diet  Filed Weights   11/13/12 2000 11/13/12 2200  Weight: 49.442 kg (109 lb) 48.3 kg (106 lb 7.7 oz)    History of present illness:  Christy Miranda is a 78 y.o. female who presents to the ED with complaints of sudden excruciating pain in her right lower leg this afternoon. The pain was unremitting So she was brought to the ED. She reports that she has varicose veins and always has edema in her legs. She denies having Chest pain or SOB. She has been on long-term coumadin therapy for a history of atrial fibrillation, and tachy-brady syndrome, and had her pacemaker replaced 5 days ago. She had to go off of her coumadin for the placement. And afterward she restarted the coumadin therapy. She was evaluated and found to have a DVT on the Venous Doppler that was performed of her RLE, and her PT/INR was also found to be sub-therapeutic at 1.65.   Hospital Course:   1. Acute DVT: Patient presented with right lower extremity excruciating pain and swelling. Upon initial evaluation in the emergency department Doppler ultrasound showed acute DVT, heparin drip initiated immediately as well as patient restarted on her Coumadin. Patient is taking Coumadin for atrial fibrillation but that was held for  recent procedure so INR was 1.6 at the time of admission. The heparin drip was switched to Lovenox, the Coumadin was continued. The INR is been greater than 2.0 since yesterday. We will continue the Coumadin for 2 more days to ensure 5 days of overlap between Coumadin and heparin/Lovenox.  2. Atrial fibrillation: Rate is controlled with metoprolol, she is on Coumadin. As mentioned above the time of admission the Coumadin was subtherapeutic secondary to recent discontinuation for procedure. Patient had pacemaker replaced By cardiology.  3. Tachybradycardia syndrome: Patient has permanent pacemaker, this is being replaced by Dr. Johney Frame on 11/09/2012.  4. Hypertension: Controlled, blood pressure is been reasonably controlled during this hospital stay. Her medication continued.   Procedures:  Doppler ultrasound of the right lower extremity on 11/13/2012 showed acute DVT involving the peroneal vein  Consultations:  None  Discharge Exam: Filed Vitals:   11/16/12 0536 11/16/12 0542 11/16/12 0937 11/16/12 1040  BP: 142/63 107/67 145/71 132/63  Pulse: 81 71 84 83  Temp: 97.5 F (36.4 C) 97.9 F (36.6 C)  97.6 F (36.4 C)  TempSrc: Oral Oral  Oral  Resp: 17 18  20   Height:      Weight:      SpO2: 98% 97%  100%   General: Alert and awake, oriented x3, not in any acute distress. HEENT: anicteric sclera, pupils reactive to light and accommodation, EOMI CVS: S1-S2 clear, no murmur rubs or gallops Chest: clear to auscultation bilaterally, no wheezing, rales or rhonchi Abdomen: soft nontender, nondistended, normal bowel sounds, no organomegaly Extremities: no cyanosis, clubbing or edema noted bilaterally Neuro: Cranial nerves II-XII intact, no focal neurological deficits  Discharge Instructions  Discharge Orders    Future Appointments: Provider: Department: Dept Phone: Center:   12/03/2012 3:30 PM [facility] [phone] None     Future  Orders Please Complete By Expires   Diet - low sodium heart healthy      Increase activity slowly          Medication List     As of 11/16/2012 10:47 AM    STOP taking these medications         furosemide 20 MG tablet   Commonly known as: LASIX      TAKE these medications         ALPRAZolam 0.25 MG tablet   Commonly known as: XANAX   Take 0.25 mg by mouth 3 (three) times daily as needed. For anxiety      Calcium-Vitamin D 600-200 MG-UNIT Caps   Take 1 tablet by mouth daily.      enoxaparin 80 MG/0.8ML injection   Commonly known as: LOVENOX   Inject 0.8 mLs (80 mg total) into the skin daily.      levothyroxine 50 MCG tablet   Commonly known as: SYNTHROID, LEVOTHROID   Take 50 mcg by mouth daily.      metoprolol succinate 50 MG 24 hr tablet   Commonly known as: TOPROL-XL   Take 1 tablet (50 mg total) by mouth 2 (two) times daily. Take with or immediately following a meal.      multivitamin with minerals Tabs   Take 1 tablet by mouth daily.      warfarin 5 MG tablet   Commonly known as: COUMADIN   Take 5-7.5 mg by mouth See admin instructions. Take 1 tablet on Mon and Fri.  Take 1.5 tablets (7.5mg )  on Tuesday, Wednesday, Thursday, Saturday and Sunday.           Follow-up Information    Follow up with Mila Palmer A, MD. In 1 week.   Contact information:   [address] [place] [address] [phone]           The results of significant diagnostics from this hospitalization (including imaging, microbiology, ancillary and laboratory) are listed below for reference.    Significant Diagnostic Studies: Dg Chest 2 View  11/09/2012  *RADIOLOGY REPORT*  Clinical Data: Preoperative examination for change of pacemaker generator.  CHEST - 2 VIEW  Comparison: 11/28/2009  Findings: Right-sided dual chamber pacemaker has a stable radiographic appearance.  There is stable cardiomegaly.  No pulmonary edema, consolidation, pleural fluid or pneumothorax is  identified.  The spine shows mild degenerative changes, scoliosis and kyphosis as well as diffuse osteopenia.  IMPRESSION: No active disease.  Stable appearance of pacemaker and cardiomegaly.   Original Report Authenticated By: Irish Lack, M.D.     Microbiology: Recent Results (from the past 240 hour(s))  SURGICAL PCR SCREEN     Status: Normal   Collection Time   11/09/12  3:07 PM      Component Value Range Status Comment   MRSA, PCR NEGATIVE  NEGATIVE Final    Staphylococcus aureus NEGATIVE  NEGATIVE Final      Labs: Basic Metabolic Panel:  Lab 11/14/12 6578 11/13/12 1830 11/13/12 1814  NA 139 138 135  K 4.0 4.3 4.4  CL 105 101 100  CO2 25 -- 28  GLUCOSE 94 99 103*  BUN 11 14 14   CREATININE 0.52 0.70 0.56  CALCIUM 8.8 -- 9.1  MG -- -- --  PHOS -- -- --  Liver Function Tests: No results found for this basename: AST:5,ALT:5,ALKPHOS:5,BILITOT:5,PROT:5,ALBUMIN:5 in the last 168 hours No results found for this basename: LIPASE:5,AMYLASE:5 in the last 168 hours No results found for this basename: AMMONIA:5 in the last 168 hours CBC:  Lab 11/16/12 0615 11/15/12 0500 11/14/12 0620 11/13/12 1830 11/13/12 1814  WBC 4.0 4.6 3.8* -- 3.3*  NEUTROABS -- -- -- -- --  HGB 11.3* 11.7* 11.8* 12.6 11.8*  HCT 34.5* 35.2* 35.6* 37.0 35.7*  MCV 96.4 94.4 94.2 -- 94.7  PLT 95* 109* 113* -- 109*   Cardiac Enzymes: No results found for this basename: CKTOTAL:5,CKMB:5,CKMBINDEX:5,TROPONINI:5 in the last 168 hours BNP: BNP (last 3 results) No results found for this basename: PROBNP:3 in the last 8760 hours CBG: No results found for this basename: GLUCAP:5 in the last 168 hours     Signed:  [facility] A  [facility] 11/16/2012, 10:47 AM

## 2012-11-16 NOTE — Progress Notes (Addendum)
ANTICOAGULATION CONSULT NOTE - Follow Up Consult  Pharmacy Consult:  Lovenox  / Coumadin (Overlap D#3/5) Indication:  +DVT and history of AFib  Allergies  Allergen Reactions  . Betapace (Sotalol Hcl)     unknown    Patient Measurements: Height: 4\' 10"  (147.3 cm) Weight: 106 lb 7.7 oz (48.3 kg) IBW/kg (Calculated) : 40.9   Vital Signs: Temp: 97.9 F (36.6 C) (12/17 0542) Temp src: Oral (12/17 0542) BP: 107/67 mmHg (12/17 0542) Pulse Rate: 71  (12/17 0542)  Labs:  Basename 11/16/12 0615 11/15/12 0500 11/14/12 0620 11/13/12 2014 11/13/12 1830 11/13/12 1814  HGB 11.3* 11.7* -- -- -- --  HCT 34.5* 35.2* 35.6* -- -- --  PLT 95* 109* 113* -- -- --  APTT -- -- -- 32 -- --  LABPROT 22.8* 22.4* 19.0* -- -- --  INR 2.11* 2.06* 1.65* -- -- --  HEPARINUNFRC -- -- 0.21* -- -- --  CREATININE -- -- 0.52 -- 0.70 0.56  CKTOTAL -- -- -- -- -- --  CKMB -- -- -- -- -- --  TROPONINI -- -- -- -- -- --    Estimated Creatinine Clearance: 30.8 ml/min (by C-G formula based on Cr of 0.52).     Assessment: 15 YOM recently discharged post pacer generator change, admitted 11/13/12 with chief complaint of right calf pain.  Patient found to have a new DVT while taking Coumadin for Afib.  Noted INR sub-therapeutic upon admission because she was off of her Coumadin therapy for the pacemaker adjustment.  Today is day #3/5 of overlap, therefore, will continue Lovenox although INR is therapeutic (MD aware).  Lovenox remains at daily dosing due to borderline renal clearance.  No bleeding reported.   Goal of Therapy:  INR 2-3 Monitor platelets by anticoagulation protocol: Yes    Plan:  - Coumadin 7.5mg  PO today  - Continue Lovenox 50mg  SQ Q24H through Thursday 11/18/12 - Daily PT / INR - Coumadin education completed    Lillyth Spong D. Laney Potash, PharmD, BCPS Pager:  [phone] 11/16/2012, 8:30 AM

## 2012-11-16 NOTE — Discharge Summary (Signed)
Physician Discharge Summary  Linde Wilensky ZOX:[id number] DOB: 06/17/1921 DOA: 11/13/2012  PCP: Emeterio Reeve, MD  Admit date: 11/13/2012 Discharge date: 11/16/2012  Time spent: 40 minutes minutes  Recommendations for Outpatient Follow-up:  1. Followup with primary care physician and [facility].  Discharge Diagnoses:  Principal Problem:  *Deep venous thrombosis of lower leg Active Problems:  Atrial fibrillation  Tachycardia-bradycardia syndrome  Hypertension  Current use of long term anticoagulation  Pacemaker-Medtronic  Edema  Subtherapeutic international normalized ratio (INR)  Pancytopenia  Thrombocytopenia   Discharge Condition: Stable  Diet recommendation: Heart healthy diet  Filed Weights   11/13/12 2000 11/13/12 2200  Weight: 49.442 kg (109 lb) 48.3 kg (106 lb 7.7 oz)    History of present illness:  Christy Miranda is a 78 y.o. female who presents to the ED with complaints of sudden excruciating pain in her right lower leg this afternoon. The pain was unremitting So she was brought to the ED. She reports that she has varicose veins and always has edema in her legs. She denies having Chest pain or SOB. She has been on long-term coumadin therapy for a history of atrial fibrillation, and tachy-brady syndrome, and had her pacemaker replaced 5 days ago. She had to go off of her coumadin for the placement. And afterward she restarted the coumadin therapy. She was evaluated and found to have a DVT on the Venous Doppler that was performed of her RLE, and her PT/INR was also found to be sub-therapeutic at 1.65.   Hospital Course:   1. Acute DVT: Patient presented with right lower extremity excruciating pain and swelling. Upon initial evaluation in the emergency department Doppler ultrasound showed acute DVT, heparin drip initiated immediately as well as patient restarted on her Coumadin. Patient is taking Coumadin for atrial fibrillation but that was held for  recent procedure so INR was 1.6 at the time of admission. The heparin drip was switched to Lovenox, the Coumadin was continued. The INR is been greater than 2.0 since yesterday. We will continue the Coumadin for 2 more days to ensure 5 days of overlap between Coumadin and heparin/Lovenox.  2. Atrial fibrillation: Rate is controlled with metoprolol, she is on Coumadin. As mentioned above the time of admission the Coumadin was subtherapeutic secondary to recent discontinuation for procedure. Patient had pacemaker replaced By cardiology.  3. Tachybradycardia syndrome: Patient has permanent pacemaker, this is being replaced by Dr. Johney Frame on 11/09/2012.  4. Hypertension: Controlled, blood pressure is been reasonably controlled during this hospital stay. Her medication continued.   Procedures:  Doppler ultrasound of the right lower extremity on 11/13/2012 showed acute DVT involving the peroneal vein  Consultations:  None  Discharge Exam: Filed Vitals:   11/16/12 0536 11/16/12 0542 11/16/12 0937 11/16/12 1040  BP: 142/63 107/67 145/71 132/63  Pulse: 81 71 84 83  Temp: 97.5 F (36.4 C) 97.9 F (36.6 C)  97.6 F (36.4 C)  TempSrc: Oral Oral  Oral  Resp: 17 18  20   Height:      Weight:      SpO2: 98% 97%  100%   General: Alert and awake, oriented x3, not in any acute distress. HEENT: anicteric sclera, pupils reactive to light and accommodation, EOMI CVS: S1-S2 clear, no murmur rubs or gallops Chest: clear to auscultation bilaterally, no wheezing, rales or rhonchi Abdomen: soft nontender, nondistended, normal bowel sounds, no organomegaly Extremities: no cyanosis, clubbing or edema noted bilaterally Neuro: Cranial nerves II-XII intact, no focal neurological deficits  Discharge Instructions  Discharge Orders    Future Appointments: Provider: Department: Dept Phone: Center:   12/03/2012 3:30 PM [facility] [phone] None      Future Orders Please Complete By Expires   Diet - low sodium heart healthy      Increase activity slowly          Medication List     As of 11/16/2012 11:02 AM    STOP taking these medications         furosemide 20 MG tablet   Commonly known as: LASIX      TAKE these medications         ALPRAZolam 0.25 MG tablet   Commonly known as: XANAX   Take 0.25 mg by mouth 3 (three) times daily as needed. For anxiety      Calcium-Vitamin D 600-200 MG-UNIT Caps   Take 1 tablet by mouth daily.      enoxaparin 80 MG/0.8ML injection   Commonly known as: LOVENOX   Inject 0.8 mLs (80 mg total) into the skin daily.      levothyroxine 50 MCG tablet   Commonly known as: SYNTHROID, LEVOTHROID   Take 50 mcg by mouth daily.      metoprolol succinate 50 MG 24 hr tablet   Commonly known as: TOPROL-XL   Take 1 tablet (50 mg total) by mouth 2 (two) times daily. Take with or immediately following a meal.      multivitamin with minerals Tabs   Take 1 tablet by mouth daily.      warfarin 5 MG tablet   Commonly known as: COUMADIN   Take 5-7.5 mg by mouth See admin instructions. Take 1 tablet on Mon and Fri.  Take 1.5 tablets (7.5mg )  on Tuesday, Wednesday, Thursday, Saturday and Sunday.         Follow-up Information    Follow up with Mila Palmer A, MD. In 1 week.   Contact information:   [address] [place] [address] [phone]           The results of significant diagnostics from this hospitalization (including imaging, microbiology, ancillary and laboratory) are listed below for reference.    Significant Diagnostic Studies: Dg Chest 2 View  11/09/2012  *RADIOLOGY REPORT*  Clinical Data: Preoperative examination for change of pacemaker generator.  CHEST - 2 VIEW  Comparison: 11/28/2009  Findings: Right-sided dual chamber pacemaker has a stable radiographic appearance.  There is stable cardiomegaly.  No pulmonary edema, consolidation, pleural fluid or  pneumothorax is identified.  The spine shows mild degenerative changes, scoliosis and kyphosis as well as diffuse osteopenia.  IMPRESSION: No active disease.  Stable appearance of pacemaker and cardiomegaly.   Original Report Authenticated By: Irish Lack, M.D.     Microbiology: Recent Results (from the past 240 hour(s))  SURGICAL PCR SCREEN     Status: Normal   Collection Time   11/09/12  3:07 PM      Component Value Range Status Comment   MRSA, PCR NEGATIVE  NEGATIVE Final    Staphylococcus aureus NEGATIVE  NEGATIVE Final      Labs: Basic Metabolic Panel:  Lab 11/14/12 1308 11/13/12 1830 11/13/12 1814  NA 139 138 135  K 4.0 4.3 4.4  CL 105 101 100  CO2 25 -- 28  GLUCOSE 94 99 103*  BUN 11 14 14   CREATININE 0.52 0.70 0.56  CALCIUM 8.8 -- 9.1  MG -- -- --  PHOS -- -- --   Liver  Function Tests: No results found for this basename: AST:5,ALT:5,ALKPHOS:5,BILITOT:5,PROT:5,ALBUMIN:5 in the last 168 hours No results found for this basename: LIPASE:5,AMYLASE:5 in the last 168 hours No results found for this basename: AMMONIA:5 in the last 168 hours CBC:  Lab 11/16/12 0615 11/15/12 0500 11/14/12 0620 11/13/12 1830 11/13/12 1814  WBC 4.0 4.6 3.8* -- 3.3*  NEUTROABS -- -- -- -- --  HGB 11.3* 11.7* 11.8* 12.6 11.8*  HCT 34.5* 35.2* 35.6* 37.0 35.7*  MCV 96.4 94.4 94.2 -- 94.7  PLT 95* 109* 113* -- 109*   Cardiac Enzymes: No results found for this basename: CKTOTAL:5,CKMB:5,CKMBINDEX:5,TROPONINI:5 in the last 168 hours BNP: BNP (last 3 results) No results found for this basename: PROBNP:3 in the last 8760 hours CBG: No results found for this basename: GLUCAP:5 in the last 168 hours     Signed:  Mizraim Harmening A  [facility] 11/16/2012, 11:02 AM

## 2012-11-16 NOTE — ED Provider Notes (Signed)
I saw and evaluated the patient, reviewed the resident's note and I agree with the findings and plan.  Patient seen by me new onset Right leg pain. Doppler studies confirmed calf DVT started on Heparin because just had pacemaker placed. Will be admitted by medicine.   Correction to residents note. There was initial concern about arterial vascular compromise to right foot, although it was pink and warm, because of no doppler PT or DP pulse, but formal doppler studies showed different.     Shelda Jakes, MD 11/16/12 1037

## 2012-11-16 NOTE — Progress Notes (Signed)
Pt. discharged to floor,verbalized understanding of discharged instruction,medication,restriction,diet and follow up appointment.Baseline Vitals sign stable,Pt comfortable,no sign and symptom of distress. 

## 2012-11-16 NOTE — Progress Notes (Signed)
Occupational Therapy Evaluation Patient Details Name: Christy Miranda MRN: [id number] DOB: [birth year] Today's Date: 11/16/2012 Time: 4098-1191 OT Time Calculation (min): 18 min  OT Assessment / Plan / Recommendation Clinical Impression  78 yo with recent pacemaker placement. Developed DVT. Pt lives at home alone and is appropriate to return to live alone with intermittent S of daughter. Rec shoer chair and grab bars. Nofurther OT needs.    OT Assessment  Patient does not need any further OT services    Follow Up Recommendations  No OT follow up    Barriers to Discharge  none    Equipment Recommendations  Tub/shower seat    Recommendations for Other Services  none  Frequency    eval only   Precautions / Restrictions Precautions Precautions: None   Pertinent Vitals/Pain No c/o pain    ADL  Eating/Feeding: Independent Where Assessed - Eating/Feeding: Chair Grooming: Modified independent Where Assessed - Grooming: Unsupported standing Upper Body Bathing: Modified independent Where Assessed - Upper Body Bathing: Unsupported sit to stand Lower Body Bathing: Modified independent Where Assessed - Lower Body Bathing: Unsupported sit to stand Upper Body Dressing: Modified independent Where Assessed - Upper Body Dressing: Unsupported sit to stand Lower Body Dressing: Modified independent Where Assessed - Lower Body Dressing: Unsupported sit to stand Toilet Transfer: Modified independent Toilet Transfer Method: Sit to Barista: Regular height toilet Toileting - Clothing Manipulation and Hygiene: Modified independent Where Assessed - Engineer, mining and Hygiene: Standing Equipment Used: Gait belt Transfers/Ambulation Related to ADLs: mod I ADL Comments: at baseline for ADL. Discussed home safety. Rec pt purchase shower seat and install grab bars in shoer, in addition to installing HHS. Pt agreed/    OT Diagnosis:    OT Problem List:   OT  Treatment Interventions:     OT Goals Acute Rehab OT Goals OT Goal Formulation:  (eval only)  Visit Information  Last OT Received On: 11/16/12 Assistance Needed: +1    Subjective Data      Prior Functioning     Home Living Lives With: Alone Available Help at Discharge: Family;Available PRN/intermittently Type of Home: House Home Access: Stairs to enter [employer] of Steps: 2-3 Entrance Stairs-Rails: Right Home Layout: One level Bathroom Shower/Tub: Health visitor: Standard Bathroom Accessibility: Yes How Accessible: Accessible via walker Prior Function Level of Independence: Independent Able to Take Stairs?: Yes Driving: No Comments: daughter assists as needed Communication Communication: No difficulties Dominant Hand: Right         Vision/Perception     Cognition  Overall Cognitive Status: Appears within functional limits for tasks assessed/performed Arousal/Alertness: Awake/alert Orientation Level: Appears intact for tasks assessed Behavior During Session: [facility] for tasks performed    Extremity/Trunk Assessment Right Upper Extremity Assessment RUE ROM/Strength/Tone: [facility] for tasks assessed (pacemeaker placement) Left Upper Extremity Assessment LUE ROM/Strength/Tone: WFL for tasks assessed Right Lower Extremity Assessment RLE ROM/Strength/Tone: [facility] for tasks assessed Left Lower Extremity Assessment LLE ROM/Strength/Tone: WFL for tasks assessed Trunk Assessment Trunk Assessment: Kyphotic     Mobility Bed Mobility Bed Mobility: Supine to Sit Supine to Sit: 6: Modified independent (Device/Increase time) Transfers Transfers: Sit to Stand;Stand to Sit Sit to Stand: 6: Modified independent (Device/Increase time) Stand to Sit: 6: Modified independent (Device/Increase time)     Shoulder Instructions     Exercise     Balance Balance Balance Assessed:  (WFL)   End of Session OT - End of Session Equipment Utilized During  Treatment: Gait belt Activity Tolerance:  Patient tolerated treatment well Patient left: in chair;with call bell/phone within reach Nurse Communication: Mobility status  GO     Christy Miranda,HILLARY 11/16/2012, 12:28 PM [facility], OTR/L  [phone] 11/16/2012

## 2012-11-16 NOTE — Telephone Encounter (Signed)
F/u    Call back to speak with nurse

## 2012-11-16 NOTE — Telephone Encounter (Signed)
Patient is currently in hospital.  Wants to know if Dr.Jordan is aware that she had to have new pacemaker placed and then had to return to hospital due to blood clot.  Would like to speak with Dr.Jordan's nurse Elnita Maxwell to discuss post hospital follow up. / tgs

## 2012-11-16 NOTE — Telephone Encounter (Signed)
Patient called stated she will be discharged from hospital today at 4:00 pm.Stated she is waiting on her daughter to pick her up.States she dont understand why on her discharge summary it says to follow up with PCP in 1 week and it says nothing about when to follow up with cardiology.Advised to check with her nurse when to follow up with Dr.Allred.

## 2012-11-17 ENCOUNTER — Encounter: Payer: Self-pay | Admitting: Internal Medicine

## 2012-11-17 ENCOUNTER — Ambulatory Visit: Payer: Self-pay | Admitting: Cardiology

## 2012-11-17 DIAGNOSIS — I82409 Acute embolism and thrombosis of unspecified deep veins of unspecified lower extremity: Secondary | ICD-10-CM | POA: Diagnosis not present

## 2012-11-17 DIAGNOSIS — F411 Generalized anxiety disorder: Secondary | ICD-10-CM | POA: Diagnosis not present

## 2012-11-17 DIAGNOSIS — Z45018 Encounter for adjustment and management of other part of cardiac pacemaker: Secondary | ICD-10-CM | POA: Diagnosis not present

## 2012-11-17 DIAGNOSIS — I10 Essential (primary) hypertension: Secondary | ICD-10-CM | POA: Diagnosis not present

## 2012-11-17 DIAGNOSIS — I4891 Unspecified atrial fibrillation: Secondary | ICD-10-CM | POA: Diagnosis not present

## 2012-11-17 DIAGNOSIS — Z7901 Long term (current) use of anticoagulants: Secondary | ICD-10-CM | POA: Diagnosis not present

## 2012-11-17 LAB — POCT INR: INR: 2.21

## 2012-11-18 ENCOUNTER — Encounter: Payer: Self-pay | Admitting: Internal Medicine

## 2012-11-18 ENCOUNTER — Ambulatory Visit (INDEPENDENT_AMBULATORY_CARE_PROVIDER_SITE_OTHER): Payer: Medicare Other | Admitting: *Deleted

## 2012-11-18 DIAGNOSIS — I4891 Unspecified atrial fibrillation: Secondary | ICD-10-CM | POA: Diagnosis not present

## 2012-11-18 DIAGNOSIS — I495 Sick sinus syndrome: Secondary | ICD-10-CM | POA: Diagnosis not present

## 2012-11-18 LAB — PACEMAKER DEVICE OBSERVATION
BRDY-0003RV: 110 {beats}/min
RV LEAD AMPLITUDE: 8 mv
RV LEAD THRESHOLD: 0.75 V

## 2012-11-18 NOTE — Patient Instructions (Addendum)
Return office visit 02/11/13 @ 3:00pm with Dr. Johney Frame.

## 2012-11-18 NOTE — Progress Notes (Signed)
Wound check-PPM 

## 2012-11-19 ENCOUNTER — Telehealth: Payer: Self-pay | Admitting: Internal Medicine

## 2012-11-19 NOTE — Telephone Encounter (Signed)
New Problem:    Patient called in wanting to speak with you about some issues she is having with a blood clot after her procedure.  Please call back.

## 2012-11-19 NOTE — Telephone Encounter (Signed)
Spoke with patient and her leg is more swollen today.  Discussed with Dr Johney Frame he says to take Furosemide 20mg  for the next 3 days and see if that will help and to follow up with her PCP who has been seeing her for the DVT

## 2012-11-22 DIAGNOSIS — I82409 Acute embolism and thrombosis of unspecified deep veins of unspecified lower extremity: Secondary | ICD-10-CM | POA: Diagnosis not present

## 2012-11-22 DIAGNOSIS — F411 Generalized anxiety disorder: Secondary | ICD-10-CM | POA: Diagnosis not present

## 2012-11-22 DIAGNOSIS — I10 Essential (primary) hypertension: Secondary | ICD-10-CM | POA: Diagnosis not present

## 2012-11-22 DIAGNOSIS — I4891 Unspecified atrial fibrillation: Secondary | ICD-10-CM | POA: Diagnosis not present

## 2012-11-22 DIAGNOSIS — Z45018 Encounter for adjustment and management of other part of cardiac pacemaker: Secondary | ICD-10-CM | POA: Diagnosis not present

## 2012-11-22 DIAGNOSIS — Z7901 Long term (current) use of anticoagulants: Secondary | ICD-10-CM | POA: Diagnosis not present

## 2012-11-23 ENCOUNTER — Telehealth: Payer: Self-pay | Admitting: Physician Assistant

## 2012-11-23 DIAGNOSIS — D619 Aplastic anemia, unspecified: Secondary | ICD-10-CM | POA: Diagnosis not present

## 2012-11-23 DIAGNOSIS — I82409 Acute embolism and thrombosis of unspecified deep veins of unspecified lower extremity: Secondary | ICD-10-CM | POA: Diagnosis not present

## 2012-11-23 NOTE — Telephone Encounter (Signed)
Patient called regarding her INR at 10pm tonight. She had pacemaker implantation 11/09/12. She was admitted afterwards with a DVT and has been on Coumadin. She states she had her INR checked a few days ago and it was 2.2. She saw her PCP today and had her INR checked and it was 2.9. She is back to taking her regular home dose of Coumadin. She denies any evidence of bleeding or any problems. She was wondering if she should continue on taking her regularly scheduled dose that she has been taking. It is unclear if she was instructed on this earlier by home health or PCP. Since her INR has tended to run therapeutic at 2.4-2.9 on this same dose in the past, I instructed her to continue the same home dose as previously planned. She stated she has repeat INR check on 12/26. I instructed her to monitor closely for any bleeding. She verbalized understanding and gratitude. Dayna Dunn PA-C

## 2012-11-25 ENCOUNTER — Ambulatory Visit: Payer: Self-pay | Admitting: Cardiology

## 2012-11-25 DIAGNOSIS — I4891 Unspecified atrial fibrillation: Secondary | ICD-10-CM

## 2012-11-30 DIAGNOSIS — Z7901 Long term (current) use of anticoagulants: Secondary | ICD-10-CM | POA: Diagnosis not present

## 2012-11-30 DIAGNOSIS — I82409 Acute embolism and thrombosis of unspecified deep veins of unspecified lower extremity: Secondary | ICD-10-CM | POA: Diagnosis not present

## 2012-11-30 DIAGNOSIS — F411 Generalized anxiety disorder: Secondary | ICD-10-CM | POA: Diagnosis not present

## 2012-11-30 DIAGNOSIS — Z45018 Encounter for adjustment and management of other part of cardiac pacemaker: Secondary | ICD-10-CM | POA: Diagnosis not present

## 2012-11-30 DIAGNOSIS — I4891 Unspecified atrial fibrillation: Secondary | ICD-10-CM | POA: Diagnosis not present

## 2012-11-30 DIAGNOSIS — I10 Essential (primary) hypertension: Secondary | ICD-10-CM | POA: Diagnosis not present

## 2012-12-03 ENCOUNTER — Ambulatory Visit (INDEPENDENT_AMBULATORY_CARE_PROVIDER_SITE_OTHER): Payer: Medicare Other | Admitting: *Deleted

## 2012-12-03 DIAGNOSIS — Z85828 Personal history of other malignant neoplasm of skin: Secondary | ICD-10-CM | POA: Diagnosis not present

## 2012-12-03 DIAGNOSIS — Z7901 Long term (current) use of anticoagulants: Secondary | ICD-10-CM | POA: Diagnosis not present

## 2012-12-03 DIAGNOSIS — Z45018 Encounter for adjustment and management of other part of cardiac pacemaker: Secondary | ICD-10-CM | POA: Diagnosis not present

## 2012-12-03 DIAGNOSIS — I4891 Unspecified atrial fibrillation: Secondary | ICD-10-CM | POA: Diagnosis not present

## 2012-12-03 DIAGNOSIS — F411 Generalized anxiety disorder: Secondary | ICD-10-CM | POA: Diagnosis not present

## 2012-12-03 DIAGNOSIS — I10 Essential (primary) hypertension: Secondary | ICD-10-CM | POA: Diagnosis not present

## 2012-12-03 DIAGNOSIS — I82409 Acute embolism and thrombosis of unspecified deep veins of unspecified lower extremity: Secondary | ICD-10-CM | POA: Diagnosis not present

## 2012-12-03 DIAGNOSIS — L253 Unspecified contact dermatitis due to other chemical products: Secondary | ICD-10-CM | POA: Diagnosis not present

## 2012-12-03 DIAGNOSIS — L408 Other psoriasis: Secondary | ICD-10-CM | POA: Diagnosis not present

## 2012-12-03 LAB — POCT INR: INR: 3

## 2012-12-14 ENCOUNTER — Ambulatory Visit (INDEPENDENT_AMBULATORY_CARE_PROVIDER_SITE_OTHER): Payer: Medicare Other

## 2012-12-14 ENCOUNTER — Encounter: Payer: Self-pay | Admitting: Cardiology

## 2012-12-14 ENCOUNTER — Ambulatory Visit (INDEPENDENT_AMBULATORY_CARE_PROVIDER_SITE_OTHER): Payer: Medicare Other | Admitting: Cardiology

## 2012-12-14 VITALS — BP 144/82 | HR 92 | Ht <= 58 in | Wt 111.8 lb

## 2012-12-14 DIAGNOSIS — Z7901 Long term (current) use of anticoagulants: Secondary | ICD-10-CM | POA: Diagnosis not present

## 2012-12-14 DIAGNOSIS — I824Z9 Acute embolism and thrombosis of unspecified deep veins of unspecified distal lower extremity: Secondary | ICD-10-CM

## 2012-12-14 DIAGNOSIS — I4891 Unspecified atrial fibrillation: Secondary | ICD-10-CM | POA: Diagnosis not present

## 2012-12-14 DIAGNOSIS — O2230 Deep phlebothrombosis in pregnancy, unspecified trimester: Secondary | ICD-10-CM | POA: Insufficient documentation

## 2012-12-14 DIAGNOSIS — R609 Edema, unspecified: Secondary | ICD-10-CM | POA: Diagnosis not present

## 2012-12-14 LAB — POCT INR: INR: 2.6

## 2012-12-14 NOTE — Progress Notes (Signed)
Christy Miranda Date of Birth: 1921/04/03   History of Present Illness: Christy Miranda and is seen today for followup. In December she noted some increased swelling in her legs that seemed to improve on lasix for 3 days. She then underwent a generator change out for her pacemaker. Her coumadin was held for this procedure with INR 1.6. After this she developed acute swelling and pain in her right calf and was diagnosed with a right popliteal DVT. She was started on lovenox and coumadin was resumed. INR has been therapeutic since then. She still has some swelling in the right leg. No SOB or chest pain.   Current Outpatient Prescriptions on File Prior to Visit  Medication Sig Dispense Refill  . ALPRAZolam (XANAX) 0.25 MG tablet Take 0.25 mg by mouth 3 (three) times daily as needed. For anxiety      . Calcium Carbonate-Vitamin D (CALCIUM-VITAMIN D) 600-200 MG-UNIT CAPS Take 1 tablet by mouth daily.       [name] levothyroxine (SYNTHROID, LEVOTHROID) 50 MCG tablet Take 50 mcg by mouth daily.        . metoprolol succinate (TOPROL-XL) 50 MG 24 hr tablet Take 1 tablet (50 mg total) by mouth 2 (two) times daily. Take with or immediately following a meal.  180 tablet  3  . Multiple Vitamin (MULTIVITAMIN WITH MINERALS) TABS Take 1 tablet by mouth daily.      [name] warfarin (COUMADIN) 5 MG tablet Take 5-7.5 mg by mouth See admin instructions. Take 1 tablet on Mon and Fri. Take 1.5 tablets (7.5mg )  on Tuesday, Wednesday, Thursday, Saturday and Sunday.        Allergies  Allergen Reactions  . Betapace (Sotalol Hcl)     unknown    Past Medical History  Diagnosis Date  . HTN (hypertension)   . Permanent atrial fibrillation   . Bradycardia     s/p PPM  . Hypothyroidism   . Anxiety   . Diverticulitis     history of  . History of hysterectomy   . Long-term (current) use of anticoagulants   . Scoliosis (and kyphoscoliosis), idiopathic   . DVT (deep vein thrombosis) in pregnancy     right peroneal    Past  Surgical History  Procedure Date  . Pacemaker insertion 03/03/2006    most recent generator (MDT) change 10/30/06 by Dr Reyes Ivan  . Cardiac catheterization 01/08/1999    normal LV function  . Cardioversion 10/30/2006    Successful elective DC cardioversion  . Cardioversion 12/29/2002    Successful DC cardioversion  . Cardioversion 12/03/1999    Successful DC cardioversion  . Cholecystectomy     History  Smoking status  . Never Smoker   Smokeless tobacco  . Never Used    History  Alcohol Use No    Family History  Problem Relation Age of Onset  . Coronary artery disease Mother   . Cancer - Other Father     Pancreatic Cancer    Review of Systems: As noted in history of present illness. Pacer site has healed well. All other systems were reviewed and are negative.  Physical Exam: BP 144/82  Pulse 92  Ht 4\' 10"  (1.473 m)  Wt 111 lb 12.8 oz (50.712 kg)  BMI 23.37 kg/m2  SpO2 94% She is a pleasant elderly white female in no acute distress. HEENT is normal. Neck is supple without JVD, adenopathy, thyromegaly, or bruits. Lungs are clear. Cardiac exam reveals a irregular rate and rhythm without gallop, murmur, or  click. Her pacemaker site has healed completely. Abdomen is soft and nontender. Spine reveals kyphoscoliosis. She has 1+ right calf edema. Pulses are 2+. Skin is warm and dry. She is alert and oriented x3. Cranial nerves II through XII are intact.   LABORATORY DATA: INR 2.6  Assessment / Plan: 1. Tachybradycardia syndrome. Heart rate is well controlled on metoprolol. She is on chronic anticoagulation with Coumadin with therapeutic INRs. Recent pacemaker generator revision.  2. Hypertension, controlled.  3. Right calf DVT. Continue coumadin. Recommend support hose for swelling. I don't think diuretics needed at this point.

## 2012-12-14 NOTE — Patient Instructions (Signed)
Continue your current therapy  I will see you again in 6 months.   

## 2012-12-20 DIAGNOSIS — Z7901 Long term (current) use of anticoagulants: Secondary | ICD-10-CM | POA: Diagnosis not present

## 2012-12-20 DIAGNOSIS — I82409 Acute embolism and thrombosis of unspecified deep veins of unspecified lower extremity: Secondary | ICD-10-CM | POA: Diagnosis not present

## 2012-12-20 DIAGNOSIS — I4891 Unspecified atrial fibrillation: Secondary | ICD-10-CM | POA: Diagnosis not present

## 2012-12-20 DIAGNOSIS — I10 Essential (primary) hypertension: Secondary | ICD-10-CM | POA: Diagnosis not present

## 2012-12-20 DIAGNOSIS — Z45018 Encounter for adjustment and management of other part of cardiac pacemaker: Secondary | ICD-10-CM | POA: Diagnosis not present

## 2012-12-20 DIAGNOSIS — F411 Generalized anxiety disorder: Secondary | ICD-10-CM | POA: Diagnosis not present

## 2012-12-22 DIAGNOSIS — I10 Essential (primary) hypertension: Secondary | ICD-10-CM | POA: Diagnosis not present

## 2012-12-22 DIAGNOSIS — I4891 Unspecified atrial fibrillation: Secondary | ICD-10-CM | POA: Diagnosis not present

## 2012-12-22 DIAGNOSIS — Z45018 Encounter for adjustment and management of other part of cardiac pacemaker: Secondary | ICD-10-CM | POA: Diagnosis not present

## 2012-12-22 DIAGNOSIS — I82409 Acute embolism and thrombosis of unspecified deep veins of unspecified lower extremity: Secondary | ICD-10-CM | POA: Diagnosis not present

## 2012-12-22 DIAGNOSIS — Z7901 Long term (current) use of anticoagulants: Secondary | ICD-10-CM | POA: Diagnosis not present

## 2012-12-22 DIAGNOSIS — F411 Generalized anxiety disorder: Secondary | ICD-10-CM | POA: Diagnosis not present

## 2013-01-01 ENCOUNTER — Encounter (HOSPITAL_COMMUNITY): Payer: Self-pay | Admitting: Nurse Practitioner

## 2013-01-01 ENCOUNTER — Emergency Department (HOSPITAL_COMMUNITY)
Admission: EM | Admit: 2013-01-01 | Discharge: 2013-01-01 | Disposition: A | Discharge status: Home / Self Care | Payer: Medicare Other | Attending: Emergency Medicine | Admitting: Emergency Medicine

## 2013-01-01 ENCOUNTER — Emergency Department (HOSPITAL_COMMUNITY): Payer: Medicare Other

## 2013-01-01 ENCOUNTER — Encounter (HOSPITAL_COMMUNITY): Payer: Self-pay | Admitting: Emergency Medicine

## 2013-01-01 ENCOUNTER — Emergency Department (INDEPENDENT_AMBULATORY_CARE_PROVIDER_SITE_OTHER)
Admission: EM | Admit: 2013-01-01 | Discharge: 2013-01-01 | Disposition: A | Discharge status: Nursing Home (Includes ICF) | Payer: Medicare Other | Source: Home / Self Care | Attending: Emergency Medicine | Admitting: Emergency Medicine

## 2013-01-01 DIAGNOSIS — J438 Other emphysema: Secondary | ICD-10-CM | POA: Diagnosis not present

## 2013-01-01 DIAGNOSIS — E039 Hypothyroidism, unspecified: Secondary | ICD-10-CM | POA: Diagnosis not present

## 2013-01-01 DIAGNOSIS — R42 Dizziness and giddiness: Secondary | ICD-10-CM | POA: Diagnosis not present

## 2013-01-01 DIAGNOSIS — Z79899 Other long term (current) drug therapy: Secondary | ICD-10-CM | POA: Diagnosis not present

## 2013-01-01 DIAGNOSIS — F411 Generalized anxiety disorder: Secondary | ICD-10-CM | POA: Diagnosis not present

## 2013-01-01 DIAGNOSIS — Z86718 Personal history of other venous thrombosis and embolism: Secondary | ICD-10-CM | POA: Insufficient documentation

## 2013-01-01 DIAGNOSIS — Z8739 Personal history of other diseases of the musculoskeletal system and connective tissue: Secondary | ICD-10-CM | POA: Insufficient documentation

## 2013-01-01 DIAGNOSIS — Z8719 Personal history of other diseases of the digestive system: Secondary | ICD-10-CM | POA: Diagnosis not present

## 2013-01-01 DIAGNOSIS — I10 Essential (primary) hypertension: Secondary | ICD-10-CM | POA: Diagnosis not present

## 2013-01-01 DIAGNOSIS — R51 Headache: Secondary | ICD-10-CM | POA: Diagnosis not present

## 2013-01-01 DIAGNOSIS — Z7901 Long term (current) use of anticoagulants: Secondary | ICD-10-CM | POA: Diagnosis not present

## 2013-01-01 DIAGNOSIS — Z90710 Acquired absence of both cervix and uterus: Secondary | ICD-10-CM | POA: Insufficient documentation

## 2013-01-01 DIAGNOSIS — Z8679 Personal history of other diseases of the circulatory system: Secondary | ICD-10-CM | POA: Insufficient documentation

## 2013-01-01 LAB — COMPREHENSIVE METABOLIC PANEL
AST: 36 U/L (ref 0–37)
Albumin: 3.5 g/dL (ref 3.5–5.2)
Alkaline Phosphatase: 55 U/L (ref 39–117)
BUN: 15 mg/dL (ref 6–23)
CO2: 28 mEq/L (ref 19–32)
Chloride: 100 mEq/L (ref 96–112)
GFR calc non Af Amer: 83 mL/min — ABNORMAL LOW (ref >90)
Potassium: 4.3 mEq/L (ref 3.5–5.1)
Total Bilirubin: 0.3 mg/dL (ref 0.3–1.2)

## 2013-01-01 LAB — CBC WITH DIFFERENTIAL/PLATELET
Basophils Absolute: 0 10*3/uL (ref 0.0–0.1)
Basophils Relative: 1 % (ref 0–1)
HCT: 36.1 % (ref 36.0–46.0)
Hemoglobin: 12 g/dL (ref 12.0–15.0)
Lymphocytes Relative: 20 % (ref 12–46)
MCHC: 33.2 g/dL (ref 30.0–36.0)
Monocytes Relative: 9 % (ref 3–12)
Neutro Abs: 2.4 10*3/uL (ref 1.7–7.7)
Neutrophils Relative %: 70 % (ref 43–77)
WBC: 3.5 10*3/uL — ABNORMAL LOW (ref 4.0–10.5)

## 2013-01-01 LAB — PROTIME-INR
INR: 2.64 — ABNORMAL HIGH (ref 0.00–1.49)
Prothrombin Time: 26.9 seconds — ABNORMAL HIGH (ref 11.6–15.2)

## 2013-01-01 LAB — POCT I-STAT TROPONIN I

## 2013-01-01 LAB — APTT: aPTT: 37 seconds (ref 24–37)

## 2013-01-01 NOTE — ED Notes (Addendum)
Pt states since this am she "just hasnt felt right." States "it feels like my head is in a fog." pt is concerned for stroke because she has a hx of heart problems and dvt. Pt is A&Ox4, speech is clear, ambualtory, grips = bilateral. Went to [facility] and they sent for further eval

## 2013-01-01 NOTE — ED Provider Notes (Signed)
Medical screening examination/treatment/procedure(s) were performed by non-physician practitioner and as supervising physician I was immediately available for consultation/collaboration.  Leslee Home, M.D.   Reuben Likes, MD 01/01/13 [phone]

## 2013-01-01 NOTE — ED Notes (Signed)
Pt c/o after breakfast she nodded off at kitchen table. When she got up pt states that her head did not feel right. C/o achess/pressure of back of head. "didnt feel like her self"   Some mild dizziness.

## 2013-01-01 NOTE — ED Notes (Signed)
Returned from Ct

## 2013-01-01 NOTE — ED Provider Notes (Signed)
History     CSN: [id number]  Arrival date & time 01/01/13  1631   First MD Initiated Contact with Patient 01/01/13 1655      Chief Complaint  Patient presents with  . Dizziness    felt fine this a.m but after breakfast pt states that she just didnt feel right. achiness in back of head    HPI: Patient is a 79 y.o. female presenting with headaches.  Headache The primary symptoms include headaches and dizziness. Primary symptoms do not include visual change, focal weakness, speech change, fever, nausea or vomiting. The symptoms began 6 to 12 hours ago. The symptoms are resolved. The neurological symptoms are diffuse. The symptoms occurred after standing up.  The headache is not associated with visual change or weakness.  Dizziness does not occur with nausea, vomiting or weakness.  Additional symptoms do not include weakness.  Pt reports an episode today at approx 1200. States she was at the breakfast table and afterwards she "fell asleep at the table". When she awoke she had a tight feeling in the back of her head and she was dizzy. States she was not so dizzy she could not walk but was dizzy. Ask to describe the dizziness she states she just felt "foggy in my head". Pt denies visual changes, unilateral weakness, CP, N/V/D or abd pain. States the "tightness" in the back of her head persist but not as bad as this am. Pt states she was recent diagnosed w/ a DVT in RLE and has been on coumadin so she is very worried about having had or an impending stroke.   Past Medical History  Diagnosis Date  . HTN (hypertension)   . Permanent atrial fibrillation   . Bradycardia     s/p PPM  . Hypothyroidism   . Anxiety   . Diverticulitis     history of  . History of hysterectomy   . Long-term (current) use of anticoagulants   . Scoliosis (and kyphoscoliosis), idiopathic   . DVT (deep vein thrombosis) in pregnancy     right peroneal    Past Surgical History  Procedure Date  . Pacemaker insertion  03/03/2006    most recent generator (MDT) change 10/30/06 by Dr Reyes Ivan  . Cardiac catheterization 01/08/1999    normal LV function  . Cardioversion 10/30/2006    Successful elective DC cardioversion  . Cardioversion 12/29/2002    Successful DC cardioversion  . Cardioversion 12/03/1999    Successful DC cardioversion  . Cholecystectomy     Family History  Problem Relation Age of Onset  . Coronary artery disease Mother   . Cancer - Other Father     Pancreatic Cancer    History  Substance Use Topics  . Smoking status: Never Smoker   . Smokeless tobacco: Never Used  . Alcohol Use: No    OB History    Grav Para Term Preterm Abortions TAB SAB Ect Mult Living                  Review of Systems  Constitutional: Negative.  Negative for fever.  Eyes: Negative.   Respiratory: Negative.   Cardiovascular: Negative for chest pain and palpitations.  Gastrointestinal: Negative for nausea, vomiting and abdominal pain.  Genitourinary: Negative for dysuria and frequency.  Musculoskeletal: Negative.   Neurological: Positive for dizziness and headaches. Negative for speech change, focal weakness, speech difficulty, weakness and numbness.  Hematological: Negative.   Psychiatric/Behavioral: Negative.     Allergies  Betapace  Home Medications  Current Outpatient Rx  Name  Route  Sig  Dispense  Refill  . ALPRAZOLAM 0.25 MG PO TABS   Oral   Take 0.25 mg by mouth 3 (three) times daily as needed. For anxiety         . CALCIUM-VITAMIN D 600-200 MG-UNIT PO CAPS   Oral   Take 1 tablet by mouth daily.          [name] METOPROLOL SUCCINATE ER 50 MG PO TB24   Oral   Take 1 tablet (50 mg total) by mouth 2 (two) times daily. Take with or immediately following a meal.   180 tablet   3     Dispense as written.   . ADULT MULTIVITAMIN W/MINERALS CH   Oral   Take 1 tablet by mouth daily.         . WARFARIN SODIUM 5 MG PO TABS   Oral   Take 5-7.5 mg by mouth See admin instructions.  Take 1 tablet on Mon and Fri. Take 1.5 tablets (7.5mg )  on Tuesday, Wednesday, Thursday, Saturday and Sunday.         [name] LEVOTHYROXINE SODIUM 50 MCG PO TABS   Oral   Take 50 mcg by mouth daily.             BP 164/74  Pulse 86  Temp 98.2 F (36.8 C) (Oral)  Resp 17  SpO2 95%  Physical Exam  Constitutional: She is oriented to person, place, and time. She appears well-developed and well-nourished. She is cooperative.  Non-toxic appearance. She does not have a sickly appearance. She does not appear ill. No distress.  HENT:  Head: Normocephalic and atraumatic.  Eyes: Conjunctivae normal and EOM are normal. Pupils are equal, round, and reactive to light. No scleral icterus.  Neck: Neck supple.  Cardiovascular: Normal rate.  An irregular rhythm present.  Pulmonary/Chest: Effort normal and breath sounds normal.  Abdominal: Soft. Bowel sounds are normal.  Musculoskeletal: Normal range of motion.  Neurological: She is alert and oriented to person, place, and time. She has normal strength. No cranial nerve deficit. GCS eye subscore is 4. GCS verbal subscore is 5. GCS motor subscore is 6.       Grips equal bil  Skin: Skin is warm and dry.  Psychiatric: She has a normal mood and affect.    ED Course  Procedures   Labs Reviewed - No data to display No results found.   No diagnosis found.    MDM  Episode today of h/a (which persist) and dizziness. No unilateral sx's or focal neurological deficits identified. Though HPI is somewhat vague and there is low index of suspicion for stroke given pt's age, significant hx and age will send to [facility] for evaluation. Pt is agreeable.        Leanne Chang, NP 01/01/13 1726

## 2013-01-01 NOTE — ED Notes (Signed)
Daughter at bedside- said that pt feels certain that she is going to have a stroke ever since she had DVT, and becomes anxious easily.

## 2013-01-01 NOTE — ED Notes (Signed)
Pt denies n/v/d. Chest pain and sob. Pt recently had pacemaker switched out in December and a new dx of blood clot in right calf four days after surgery.

## 2013-01-01 NOTE — ED Notes (Signed)
Snack and drink offered to pt.

## 2013-01-01 NOTE — ED Provider Notes (Signed)
History     CSN: [id number]  Arrival date & time 01/01/13  1749   First MD Initiated Contact with Patient 01/01/13 1809      Chief Complaint  Patient presents with  . Dizziness    (Consider location/radiation/quality/duration/timing/severity/associated sxs/prior treatment) HPI Patient presents to the emergency department with some symptoms she has difficulty describing. She states she has "an odd feeling". She denies dizziness such as spinning. She states she her " head is in a fog" but denies having any trouble thinking. She states it started about 1 PM after waking up from a nap. She states she felt a little off balance and is walking slower than normal but she was able to walk. She denies feeling that she's going to pass out. She denies headache or any pain. She denies any visual disturbance. She denies feeling weak. She denies having any difficulty thinking of what she needs to stay. She states she's never had this before. Patient is very concerned that she's having a stroke. She states she's on Coumadin and she is supposed to have her INR rechecked again on February 4. Her only other comment is that she has scoliosis and her neck is  PCP Dr Paul[facility]    Dr Donnamae Jude    Dr [place]   Past Medical History  Diagnosis Date  . HTN (hypertension)   . Permanent atrial fibrillation   . Bradycardia     s/p PPM  . Hypothyroidism   . Anxiety   . Diverticulitis     history of  . History of hysterectomy   . Long-term (current) use of anticoagulants   . Scoliosis (and kyphoscoliosis), idiopathic   . DVT (deep vein thrombosis) in pregnancy     right peroneal    Past Surgical History  Procedure Date  . Pacemaker insertion 03/03/2006    most recent generator (MDT) change 10/30/06 by Dr Reyes Ivan  . Cardiac catheterization 01/08/1999    normal LV function  . Cardioversion 10/30/2006    Successful elective DC cardioversion  . Cardioversion 12/29/2002    Successful DC  cardioversion  . Cardioversion 12/03/1999    Successful DC cardioversion  . Cholecystectomy     Family History  Problem Relation Age of Onset  . Coronary artery disease Mother   . Cancer - Other Father     Pancreatic Cancer    History  Substance Use Topics  . Smoking status: Never Smoker   . Smokeless tobacco: Never Used  . Alcohol Use: No  Lives at home Lives alone  OB History    Grav Para Term Preterm Abortions TAB SAB Ect Mult Living                  Review of Systems  All other systems reviewed and are negative.    Allergies  Betapace  Home Medications   Current Outpatient Rx  Name  Route  Sig  Dispense  Refill  . ALPRAZOLAM 0.25 MG PO TABS   Oral   Take 0.25 mg by mouth 3 (three) times daily as needed. For anxiety         . CALCIUM-VITAMIN D 600-200 MG-UNIT PO CAPS   Oral   Take 1 tablet by mouth daily.          [name] LEVOTHYROXINE SODIUM 50 MCG PO TABS   Oral   Take 50 mcg by mouth daily.           [name] METOPROLOL SUCCINATE ER 50 MG PO  TB24   Oral   Take 1 tablet (50 mg total) by mouth 2 (two) times daily. Take with or immediately following a meal.   180 tablet   3     Dispense as written.   . ADULT MULTIVITAMIN W/MINERALS CH   Oral   Take 1 tablet by mouth daily.         . WARFARIN SODIUM 5 MG PO TABS   Oral   Take 5-7.5 mg by mouth See admin instructions. Take 1 tablet on Mon and Fri. Take 1.5 tablets (7.5mg )  on Tuesday, Wednesday, Thursday, Saturday and Sunday.           BP 157/88  Pulse 96  Temp 98.5 F (36.9 C) (Oral)  Resp 24  SpO2 96%  Vital signs normal    Physical Exam  Nursing note and vitals reviewed. Constitutional: She is oriented to person, place, and time.  Non-toxic appearance. She does not appear ill. No distress.       Small frail elderly female in no acute distress  HENT:  Head: Normocephalic and atraumatic.  Right Ear: External ear normal.  Left Ear: External ear normal.  Nose: Nose normal. No  mucosal edema or rhinorrhea.  Mouth/Throat: Oropharynx is clear and moist and mucous membranes are normal. No dental abscesses or uvula swelling.  Eyes: Conjunctivae normal and EOM are normal. Pupils are equal, round, and reactive to light.  Neck: Normal range of motion and full passive range of motion without pain. Neck supple.  Cardiovascular: Normal rate, regular rhythm and normal heart sounds.  Exam reveals no gallop and no friction rub.   No murmur heard. Pulmonary/Chest: Effort normal and breath sounds normal. No respiratory distress. She has no wheezes. She has no rhonchi. She has no rales. She exhibits no tenderness and no crepitus.  Abdominal: Soft. Normal appearance and bowel sounds are normal. She exhibits no distension. There is no tenderness. There is no rebound and no guarding.  Musculoskeletal: Normal range of motion. She exhibits no edema and no tenderness.       Moves all extremities well. Kyphosis  Neurological: She is alert and oriented to person, place, and time. She has normal strength. No cranial nerve deficit.       No focal motor deficit, no nystagmus no cranial nerve deficit`  Skin: Skin is warm, dry and intact. No rash noted. No erythema. No pallor.  Psychiatric: She has a normal mood and affect. Her speech is normal and behavior is normal. Her mood appears not anxious.    ED Course  Procedures (including critical care time)  Pt remains unchanged in the ED. Orthostatic VS are normal  Walked fine per nursing staff.  Results for orders placed during the hospital encounter of 01/01/13  CBC WITH DIFFERENTIAL      Component Value Range   WBC 3.5 (*) 4.0 - 10.5 K/uL   RBC 3.79 (*) 3.87 - 5.11 MIL/uL   Hemoglobin 12.0  12.0 - 15.0 g/dL   HCT 47.8  29.5 - 62.1 %   MCV 95.3  78.0 - 100.0 fL   MCH 31.7  26.0 - 34.0 pg   MCHC 33.2  30.0 - 36.0 g/dL   RDW 30.8  65.7 - 84.6 %   Platelets 142 (*) 150 - 400 K/uL   Neutrophils Relative 70  43 - 77 %   Neutro Abs 2.4  1.7  - 7.7 K/uL   Lymphocytes Relative 20  12 - 46 %   Lymphs  Abs 0.7  0.7 - 4.0 K/uL   Monocytes Relative 9  3 - 12 %   Monocytes Absolute 0.3  0.1 - 1.0 K/uL   Eosinophils Relative 1  0 - 5 %   Eosinophils Absolute 0.0  0.0 - 0.7 K/uL   Basophils Relative 1  0 - 1 %   Basophils Absolute 0.0  0.0 - 0.1 K/uL  COMPREHENSIVE METABOLIC PANEL      Component Value Range   Sodium 135  135 - 145 mEq/L   Potassium 4.3  3.5 - 5.1 mEq/L   Chloride 100  96 - 112 mEq/L   CO2 28  19 - 32 mEq/L   Glucose, Bld 106 (*) 70 - 99 mg/dL   BUN 15  6 - 23 mg/dL   Creatinine, Ser 1.61  0.50 - 1.10 mg/dL   Calcium 8.9  8.4 - 09.6 mg/dL   Total Protein 7.0  6.0 - 8.3 g/dL   Albumin 3.5  3.5 - 5.2 g/dL   AST 36  0 - 37 U/L   ALT 18  0 - 35 U/L   Alkaline Phosphatase 55  39 - 117 U/L   Total Bilirubin 0.3  0.3 - 1.2 mg/dL   GFR calc non Af Amer 83 (*) >90 mL/min   GFR calc Af Amer >90  >90 mL/min  APTT      Component Value Range   aPTT 37  24 - 37 seconds  PROTIME-INR      Component Value Range   Prothrombin Time 26.9 (*) 11.6 - 15.2 seconds   INR 2.64 (*) 0.00 - 1.49  TROPONIN I      Component Value Range   Troponin I <0.30  <0.30 ng/mL  POCT I-STAT TROPONIN I      Component Value Range   Troponin i, poc 0.00  0.00 - 0.08 ng/mL   Comment 3             Laboratory interpretation all normal except therapeutic INR   Ct Head Wo Contrast  01/01/2013  *RADIOLOGY REPORT*  Clinical Data: Dizziness.  Does not feel right.  On Coumadin.  The  CT HEAD WITHOUT CONTRAST  Technique:  Contiguous axial images were obtained from the base of the skull through the vertex without contrast.  Comparison: CT head without contrast 02/24/2011.  Findings: Scattered subcortical white matter hypoattenuation is stable.  Mild generalized atrophy and periventricular matter hypoattenuation is stable as well.  No acute cortical infarct, hemorrhage, or mass lesion is present.  The ventricles are of normal size.  The paranasal sinuses  and mastoid air cells are clear.  Atherosclerotic calcifications are present within the cavernous carotid arteries.  The osseous skull is intact.  IMPRESSION:  1.  Stable appearance of diffuse subcortical white matter hypoattenuation, likely reflecting sequelae of chronic microvascular ischemia. 2.  Mild generalized atrophy is stable. 3.  No acute intracranial abnormality.   Original Report Authenticated By: Marin Roberts, M.D.    Dg Chest Portable 1 View  01/01/2013  *RADIOLOGY REPORT*  Clinical Data: Dizziness.  PORTABLE CHEST - 1 VIEW  Comparison: Two-view chest 11/09/2012.  Findings: The heart is enlarged.  Pacing wires are stable in position.  Emphysematous changes are noted.  Chronic interstitial coarsening is present.  No focal airspace disease is evident. There is no edema or effusion to suggest failure.  Degenerative changes are present in the shoulders bilaterally.  IMPRESSION:  1.  Cardiomegaly without failure. 2.  Emphysema. 3.  No acute  cardiopulmonary disease.   Original Report Authenticated By: Marin Roberts, M.D.      Date: 01/01/2013  Rate: 87  Rhythm: atrial fibrillation  QRS Axis: normal  Intervals: normal  ST/T Wave abnormalities: nonspecific ST/T changes  Conduction Disutrbances:none  Narrative Interpretation:   Old EKG Reviewed: unchanged from 11/09/12    1. Dizziness    Plan discharge  Devoria Albe, MD, FACEP   MDM          Ward Givens, MD 01/01/13 2158

## 2013-01-01 NOTE — ED Notes (Signed)
Christy Miranda daughter [phone]

## 2013-01-04 ENCOUNTER — Ambulatory Visit (INDEPENDENT_AMBULATORY_CARE_PROVIDER_SITE_OTHER): Payer: Medicare Other | Admitting: *Deleted

## 2013-01-04 DIAGNOSIS — I4891 Unspecified atrial fibrillation: Secondary | ICD-10-CM | POA: Diagnosis not present

## 2013-01-04 LAB — POCT INR: INR: 2.6

## 2013-01-11 DIAGNOSIS — Z45018 Encounter for adjustment and management of other part of cardiac pacemaker: Secondary | ICD-10-CM | POA: Diagnosis not present

## 2013-01-11 DIAGNOSIS — I82409 Acute embolism and thrombosis of unspecified deep veins of unspecified lower extremity: Secondary | ICD-10-CM | POA: Diagnosis not present

## 2013-01-11 DIAGNOSIS — Z7901 Long term (current) use of anticoagulants: Secondary | ICD-10-CM | POA: Diagnosis not present

## 2013-01-11 DIAGNOSIS — F411 Generalized anxiety disorder: Secondary | ICD-10-CM | POA: Diagnosis not present

## 2013-01-11 DIAGNOSIS — I4891 Unspecified atrial fibrillation: Secondary | ICD-10-CM | POA: Diagnosis not present

## 2013-01-11 DIAGNOSIS — I10 Essential (primary) hypertension: Secondary | ICD-10-CM | POA: Diagnosis not present

## 2013-01-17 DIAGNOSIS — R05 Cough: Secondary | ICD-10-CM | POA: Diagnosis not present

## 2013-01-18 ENCOUNTER — Telehealth: Payer: Self-pay | Admitting: Cardiology

## 2013-01-18 MED ORDER — METOPROLOL SUCCINATE ER 50 MG PO TB24: ORAL_TABLET | ORAL | Status: DC

## 2013-01-18 NOTE — Telephone Encounter (Signed)
Advised pt to check her BP and pulse today and call us back.

## 2013-01-18 NOTE — Telephone Encounter (Signed)
New Problem:    Patient called in because she was seen in the [facility] clinic yesterday for a bad cold and was told that her pulse was extremely high and was instructed to see Dr. [place] today.  Patient's dose of metoprolol succinate (TOPROL-XL) 50 MG 24 hr tablet was doubled and she was placed on an antibiotic.  Please call back.

## 2013-01-18 NOTE — Telephone Encounter (Signed)
135/77 pulse 108 this was at 1pm   124/80 pulse 99 that was at 2:30pm  Dr. Laurine Blazer her pcp took her off her antibiotic and putting her on a Z pack for 5 days not sure what to do about her coumadin and dosing pt needs a call today

## 2013-01-18 NOTE — Telephone Encounter (Signed)
Pt states she has ordered Z pak  today and     pt states she took Levofloxacin 500mg  yesterday and took Levofloxacin 500 mg this am Appt changed for her to be seen in  Coumadin clinic on Friday  01/21/2013 due to possible interactions between these medications Pt verbalized understanding of these instructions

## 2013-01-18 NOTE — Telephone Encounter (Signed)
Pt needs return call from coumadin clinic to advise abx with coumadin use. On abx For pneumonia.  Pt has had her metoprolol XL increased from PCP yesterday to 75 mg bid for pulse 130's. MAR adjusted. Pt concerned about this and wants Dr [place] to advise. Pt reassured a call today or tomorrow from coumadin clinic, pt aware dr/ nurse not in office today. Pt assured dr/nurse will be updated and will receive call  from nurse to see if increased metoprolol working.  Pt agreed to plan.

## 2013-01-19 ENCOUNTER — Telehealth: Payer: Self-pay | Admitting: Cardiology

## 2013-01-19 NOTE — Telephone Encounter (Signed)
Follow-up:    Patient called in to follow-up on her call from 01/18/13.  Please call back.

## 2013-01-19 NOTE — Telephone Encounter (Signed)
Error

## 2013-01-20 MED ORDER — ALPRAZOLAM 0.25 MG PO TABS: 0.2500 mg | ORAL_TABLET | Freq: Three times a day (TID) | ORAL | Status: DC | PRN

## 2013-01-20 NOTE — Telephone Encounter (Signed)
Spoke with patient she stated she has pneumonia and was given levofloxacin.States she had swelling in cheek had to stop and was prescribed z pak.States she had fast heart beat and PCP increased toprol to 75 mg twice a day and was told to see Dr.Jordan.Appointment scheduled with Norma Fredrickson NP 01/24/13.

## 2013-01-20 NOTE — Telephone Encounter (Signed)
Follow Up  Pt following up wanting to speak to [facility].

## 2013-01-20 NOTE — Telephone Encounter (Signed)
See previous note 01/20/13.

## 2013-01-20 NOTE — Addendum Note (Signed)
Addended by: Meda Klinefelter D on: 01/20/2013 07:13 PM   Modules accepted: Orders

## 2013-01-21 ENCOUNTER — Ambulatory Visit (INDEPENDENT_AMBULATORY_CARE_PROVIDER_SITE_OTHER): Payer: Medicare Other | Admitting: *Deleted

## 2013-01-21 DIAGNOSIS — I4891 Unspecified atrial fibrillation: Secondary | ICD-10-CM | POA: Diagnosis not present

## 2013-01-24 ENCOUNTER — Ambulatory Visit (INDEPENDENT_AMBULATORY_CARE_PROVIDER_SITE_OTHER): Payer: Medicare Other | Admitting: *Deleted

## 2013-01-24 ENCOUNTER — Encounter: Payer: Self-pay | Admitting: Nurse Practitioner

## 2013-01-24 ENCOUNTER — Ambulatory Visit (INDEPENDENT_AMBULATORY_CARE_PROVIDER_SITE_OTHER): Payer: Medicare Other | Admitting: Nurse Practitioner

## 2013-01-24 VITALS — BP 146/86 | HR 88 | Resp 16 | Ht 60.0 in | Wt 110.8 lb

## 2013-01-24 DIAGNOSIS — I4891 Unspecified atrial fibrillation: Secondary | ICD-10-CM | POA: Diagnosis not present

## 2013-01-24 LAB — POCT INR: INR: 2.1

## 2013-01-24 MED ORDER — METOPROLOL SUCCINATE ER 25 MG PO TB24: ORAL_TABLET | ORAL | Status: DC

## 2013-01-24 MED ORDER — METOPROLOL SUCCINATE ER 50 MG PO TB24: ORAL_TABLET | ORAL | Status: DC

## 2013-01-24 NOTE — Progress Notes (Signed)
Christy Miranda Date of Birth: [birth year] Medical Record #[id number]  History of Present Illness: Christy Miranda is seen back today for a follow up visit. She is seen for Dr. [place]. She has multiple issues which are as outlined below. These include chronic atrial fib with tachy brady syndrome,  chronic coumadin therapy and edema. She has a pacemaker in place and has had recent generator change out. Her coumadin was held and she did develop a right popliteal DVT. Required Lovenox along with her coumadin.   She was last here in just a month ago.   She is here today. She is here alone. She has called numerous times last week. Had been to the [facility] in clinic with a bad cold/pneumonia and was told her pulse was high. Toprol was increased to 75 mg BID. Has had some changes in antibiotics and seems like she has been on Zithromax and Levaquin. Needed some direction in regards to her coumadin. Will be checking today. Still with some cough but thinks she is improving. Finished her Z pak as of Saturday. She is not dizzy or lightheaded. Little sore from coughing. No fever. Has follow up with her PCP tomorrow.   Current Outpatient Prescriptions on File Prior to Visit  Medication Sig Dispense Refill  . ALPRAZolam (XANAX) 0.25 MG tablet Take 1 tablet (0.25 mg total) by mouth 3 (three) times daily as needed. For anxiety  90 tablet  3  . Calcium Carbonate-Vitamin D (CALCIUM-VITAMIN D) 600-200 MG-UNIT CAPS Take 1 tablet by mouth daily.       [name] levothyroxine (SYNTHROID, LEVOTHROID) 50 MCG tablet Take 50 mcg by mouth daily.        . metoprolol succinate (TOPROL-XL) 50 MG 24 hr tablet Take one and a half tablets (75mg ) twice daily.  180 tablet  3  . Multiple Vitamin (MULTIVITAMIN WITH MINERALS) TABS Take 1 tablet by mouth daily.      [name] warfarin (COUMADIN) 5 MG tablet Take 5-7.5 mg by mouth See admin instructions. Take 1 tablet on Mon and Fri. Take 1.5 tablets (7.5mg )  on Tuesday, Wednesday, Thursday, Saturday and  Sunday.       No current facility-administered medications on file prior to visit.    Allergies  Allergen Reactions  . Betapace (Sotalol Hcl)     unknown    Past Medical History  Diagnosis Date  . HTN (hypertension)   . Permanent atrial fibrillation   . Bradycardia     s/p PPM  . Hypothyroidism   . Anxiety   . Diverticulitis     history of  . History of hysterectomy   . Long-term (current) use of anticoagulants   . Scoliosis (and kyphoscoliosis), idiopathic   . DVT (deep vein thrombosis) in pregnancy     right peroneal    Past Surgical History  Procedure Laterality Date  . Pacemaker insertion  03/03/2006    most recent generator (MDT) change 10/30/06 by Dr Reyes Ivan  . Cardiac catheterization  01/08/1999    normal LV function  . Cardioversion  10/30/2006    Successful elective DC cardioversion  . Cardioversion  12/29/2002    Successful DC cardioversion  . Cardioversion  12/03/1999    Successful DC cardioversion  . Cholecystectomy      History  Smoking status  . Never Smoker   Smokeless tobacco  . Never Used    History  Alcohol Use No    Family History  Problem Relation Age of Onset  . Coronary artery disease  Mother   . Cancer - Other Father     Pancreatic Cancer    Review of Systems: The review of systems is per the HPI.  All other systems were reviewed and are negative.  Physical Exam: BP 146/86  Pulse 88  Resp 16  Ht 5' (1.524 m)  Wt 110 lb 12 oz (50.236 kg)  BMI 21.63 kg/m2  SpO2 99% Patient is very pleasant and in no acute distress. She looks chronically ill. Quite kyphotic. Skin is warm and dry. Color is normal.  HEENT is unremarkable. Normocephalic/atraumatic. PERRL. Sclera are nonicteric. Neck is supple. No masses. No JVD. Lungs are clear. Cardiac exam shows an irregular rhythm. Rate is ok. Abdomen is soft. Extremities are without edema. Gait and ROM are intact. No gross neurologic deficits noted.  LABORATORY DATA: EKG today shows atrial  fib with a controlled VR.     Chemistry      Component Value Date/Time   NA 135 01/01/2013 1828   K 4.3 01/01/2013 1828   CL 100 01/01/2013 1828   CO2 28 01/01/2013 1828   BUN 15 01/01/2013 1828   CREATININE 0.51 01/01/2013 1828      Component Value Date/Time   CALCIUM 8.9 01/01/2013 1828   ALKPHOS 55 01/01/2013 1828   AST 36 01/01/2013 1828   ALT 18 01/01/2013 1828   BILITOT 0.3 01/01/2013 1828     Lab Results  Component Value Date   WBC 3.5* 01/01/2013   HGB 12.0 01/01/2013   HCT 36.1 01/01/2013   MCV 95.3 01/01/2013   PLT 142* 01/01/2013   Assessment / Plan: 1. Chronic atrial fib - rate has improved with the extra Toprol. I have left her on her current regimen.  2. Chronic coumadin - rechecking today  3. URI/pneumonia - lungs are clearer as she coughs. Has finished her antibiotics. She sees her PCP tomorrow  4. Underlying pacemaker - sees EP next month  Patient is agreeable to this plan and will call if any problems develop in the interim.

## 2013-01-24 NOTE — Patient Instructions (Addendum)
We are going to check your coumadin level today  Stay on your current medicines.   I am sending your prescription for the Toprol to the drugstore.  Keep your follow up with Dr. Johney Frame for March  Call the [facility] office at [phone] if you have any questions, problems or concerns.

## 2013-01-27 ENCOUNTER — Telehealth: Payer: Self-pay | Admitting: Cardiology

## 2013-01-27 MED ORDER — ALPRAZOLAM 0.25 MG PO TABS: 0.2500 mg | ORAL_TABLET | Freq: Three times a day (TID) | ORAL | Status: DC | PRN

## 2013-01-27 NOTE — Telephone Encounter (Signed)
See previous 01/27/13 note.

## 2013-01-27 NOTE — Telephone Encounter (Signed)
New problem    Pt stated you was suppose to have a prescription refilled for her: Alpiazolam 0.25mg  she has a new drug program and need to discuss it with you

## 2013-01-27 NOTE — Telephone Encounter (Signed)
Spoke to patient alprazolam refilled mail order [facility] # [id number] refills x 3.Also [facility] [address] called alprazolam # [phone] no refills until receives mail order.

## 2013-02-03 ENCOUNTER — Ambulatory Visit (INDEPENDENT_AMBULATORY_CARE_PROVIDER_SITE_OTHER): Payer: Medicare Other | Admitting: *Deleted

## 2013-02-03 DIAGNOSIS — I4891 Unspecified atrial fibrillation: Secondary | ICD-10-CM | POA: Diagnosis not present

## 2013-02-03 LAB — POCT INR: INR: 3.1

## 2013-02-11 ENCOUNTER — Ambulatory Visit (INDEPENDENT_AMBULATORY_CARE_PROVIDER_SITE_OTHER): Payer: Medicare Other | Admitting: *Deleted

## 2013-02-11 ENCOUNTER — Ambulatory Visit (INDEPENDENT_AMBULATORY_CARE_PROVIDER_SITE_OTHER): Payer: Medicare Other | Admitting: Internal Medicine

## 2013-02-11 ENCOUNTER — Encounter: Payer: Self-pay | Admitting: Internal Medicine

## 2013-02-11 VITALS — BP 145/68 | HR 83 | Ht 60.0 in | Wt 110.4 lb

## 2013-02-11 DIAGNOSIS — Z950 Presence of cardiac pacemaker: Secondary | ICD-10-CM

## 2013-02-11 DIAGNOSIS — I4891 Unspecified atrial fibrillation: Secondary | ICD-10-CM

## 2013-02-11 LAB — PACEMAKER DEVICE OBSERVATION
BRDY-0002RV: 60 {beats}/min
BRDY-0003RV: 110 {beats}/min
RV LEAD AMPLITUDE: 5.6 mv

## 2013-02-11 MED ORDER — METOPROLOL TARTRATE 100 MG PO TABS: 100.0000 mg | ORAL_TABLET | Freq: Two times a day (BID) | ORAL | Status: DC

## 2013-02-11 NOTE — Patient Instructions (Addendum)
Your physician wants you to follow-up in: 10/2013 with Dr Jacquiline Doe will receive a reminder letter in the mail two months in advance. If you don't receive a letter, please call our office to schedule the follow-up appointment.  Remote monitoring is used to monitor your Pacemaker of ICD from home. This monitoring reduces the number of office visits required to check your device to one time per year. It allows [place] to keep an eye on the functioning of your device to ensure it is working properly. You are scheduled for a device check from home on 05/16/13. You may send your transmission at any time that day. If you have a wireless device, the transmission will be sent automatically. After your physician reviews your transmission, you will receive a postcard with your next transmission date.   Your physician has recommended you make the following change in your medication:  1) Increase Toprol to 100mg  twice daily

## 2013-02-16 ENCOUNTER — Telehealth: Payer: Self-pay | Admitting: Internal Medicine

## 2013-02-16 NOTE — Telephone Encounter (Signed)
Spoke with patient, she has lots of questions.  One of which is related to her salt intake, another is what kind of compression stockings she should wear and also questioning about her Lopressor.  She says she was on Metoprolol ER 75mg  bid prior and now the Lopressor that she is getting is not ER.  I let her know it is a twice a day medication and that the Lopressor she is going to get should be fine.  If she notices a difference she can always go back to the ER.  She also does not want to do phone checks and has no way to do the checks from home.  She would rather come to the office and have her device followed.  I explained to her that I would discuss with Dr Swaziland and call her back with his suggestions.  Will fax the order to [facility]

## 2013-02-16 NOTE — Telephone Encounter (Signed)
New Problem   Per pt she has a question about her meds and has swelling in her legs

## 2013-02-18 DIAGNOSIS — M79609 Pain in unspecified limb: Secondary | ICD-10-CM | POA: Diagnosis not present

## 2013-02-18 DIAGNOSIS — B351 Tinea unguium: Secondary | ICD-10-CM | POA: Diagnosis not present

## 2013-02-18 NOTE — Telephone Encounter (Signed)
Patient called no answer.LMTC. 

## 2013-02-18 NOTE — Telephone Encounter (Signed)
I spoke with Christy Miranda in device clinic and have cancel her remote checks and schedule her to follow in office.  Put in a recall for 6 months for the device clinic.  Will forward her other questions and concerns to [facility] Pugh,LPN Dr Elvis Coil nurse

## 2013-02-22 ENCOUNTER — Encounter: Payer: Self-pay | Admitting: Pharmacist

## 2013-02-22 DIAGNOSIS — E039 Hypothyroidism, unspecified: Secondary | ICD-10-CM | POA: Diagnosis not present

## 2013-02-22 DIAGNOSIS — B351 Tinea unguium: Secondary | ICD-10-CM | POA: Diagnosis not present

## 2013-02-22 DIAGNOSIS — Z7901 Long term (current) use of anticoagulants: Secondary | ICD-10-CM | POA: Diagnosis not present

## 2013-02-22 DIAGNOSIS — H9209 Otalgia, unspecified ear: Secondary | ICD-10-CM | POA: Diagnosis not present

## 2013-02-22 LAB — PROTIME-INR: INR: 2.6 — AB (ref 0.9–1.1)

## 2013-02-24 ENCOUNTER — Telehealth: Payer: Self-pay | Admitting: Cardiology

## 2013-02-25 NOTE — Telephone Encounter (Signed)
Returned call to patient no answer.LMTC. 

## 2013-02-27 NOTE — Progress Notes (Signed)
PCP:  Emeterio Reeve, MD Primary Cardiologist:  Dr Swaziland  The patient presents today for electrophysiology followup.  Her SOB is stable.  Today, she denies symptoms of palpitations, chest pain,  orthopnea, PND, dizziness, presyncope, syncope, or neurologic sequela.  The patient feels that she is tolerating medications without difficulties and is otherwise without complaint today.   Past Medical History  Diagnosis Date  . HTN (hypertension)   . Permanent atrial fibrillation   . Bradycardia     s/p PPM  . Hypothyroidism   . Anxiety   . Diverticulitis     history of  . History of hysterectomy   . Long-term (current) use of anticoagulants   . Scoliosis (and kyphoscoliosis), idiopathic   . DVT (deep vein thrombosis) in pregnancy     right peroneal   Past Surgical History  Procedure Laterality Date  . Pacemaker insertion  03/03/2006    most recent generator (MDT) change 10/30/06 by Dr Reyes Ivan  . Cardiac catheterization  01/08/1999    normal LV function  . Cardioversion  10/30/2006    Successful elective DC cardioversion  . Cardioversion  12/29/2002    Successful DC cardioversion  . Cardioversion  12/03/1999    Successful DC cardioversion  . Cholecystectomy      Current Outpatient Prescriptions  Medication Sig Dispense Refill  . ALPRAZolam (XANAX) 0.25 MG tablet Take 1 tablet (0.25 mg total) by mouth 3 (three) times daily as needed. For anxiety  30 tablet  0  . Calcium Carbonate-Vitamin D (CALCIUM-VITAMIN D) 600-200 MG-UNIT CAPS Take 1 tablet by mouth daily.       [name] levothyroxine (SYNTHROID, LEVOTHROID) 50 MCG tablet Take 50 mcg by mouth daily.        . Multiple Vitamin (MULTIVITAMIN WITH MINERALS) TABS Take 1 tablet by mouth daily.      [name] warfarin (COUMADIN) 5 MG tablet Take 5-7.5 mg by mouth See admin instructions. Take 1 tablet on Mon and Fri. Take 1.5 tablets (7.5mg )  on Tuesday, Wednesday, Thursday, Saturday and Sunday.      . metoprolol (LOPRESSOR) 100 MG tablet Take 1  tablet (100 mg total) by mouth 2 (two) times daily.  180 tablet  3   No current facility-administered medications for this visit.    Allergies  Allergen Reactions  . Betapace (Sotalol Hcl)     unknown    History   Social History  . Marital Status: Widowed    Spouse Name: N/A    Number of Children: 1  . Years of Education: N/A   Occupational History  .     Social History Main Topics  . Smoking status: Never Smoker   . Smokeless tobacco: Never Used  . Alcohol Use: No  . Drug Use: No  . Sexually Active: No   Other Topics Concern  . Not on file   Social History Narrative   Lives alone in [place].    Family History  Problem Relation Age of Onset  . Coronary artery disease Mother   . Cancer - Other Father     Pancreatic Cancer   Physical Exam: Filed Vitals:   02/11/13 1526  BP: 145/68  Pulse: 83  Height: 5' (1.524 m)  Weight: 110 lb 6.4 oz (50.077 kg)    GEN- The patient is elderly but pleasant appearing, alert and oriented x 3 today.   Head- normocephalic, atraumatic Eyes-  Sclera clear, conjunctiva pink Ears- hearing intact Oropharynx- clear Neck- supple, no JVP Lymph- no cervical lymphadenopathy Lungs- Clear  to ausculation bilaterally, normal work of breathing Chest- pacemaker pocket is well healed Heart- Regular rate and rhythm (paced) GI- soft, NT, ND, + BS Extremities- no clubbing, cyanosis, + edema  Pacemaker interrogation- reviewed in detail today,  See PACEART report  Assessment and Plan:  1, Bradycardia Normal pacemaker function See Pace Art report No changes today  2. Permanent afib Continue anticoagulation and rate control long term  3. HTN Stable No change required today

## 2013-03-01 NOTE — Telephone Encounter (Signed)
Spoke to patient 02/28/13 she stated she wanted Dr.Jordan to know Dr.Allred changed her toprol to lopressor and increased dose to 100 mg twice a day.States feels more tired since increase.Also he advised for her to to wear compression stockings.States she needs a prescription sent to [facility].

## 2013-03-03 NOTE — Telephone Encounter (Signed)
I have discussed this Christy Maxwell Pugh,LPN and looks like she has tried to call the patient several times without success  I will return to her and she can ask Dr Swaziland tomorrow to get the patient a RX for the support hose

## 2013-03-03 NOTE — Telephone Encounter (Signed)
New Prob   Needing information regarding compression stalking and how to move forward with this. Would like to speak to nurse.

## 2013-03-04 ENCOUNTER — Ambulatory Visit (INDEPENDENT_AMBULATORY_CARE_PROVIDER_SITE_OTHER): Payer: Medicare Other | Admitting: Pharmacist

## 2013-03-04 DIAGNOSIS — I4891 Unspecified atrial fibrillation: Secondary | ICD-10-CM

## 2013-03-04 MED ORDER — METOPROLOL TARTRATE 100 MG PO TABS: 100.0000 mg | ORAL_TABLET | Freq: Two times a day (BID) | ORAL | Status: DC

## 2013-03-04 NOTE — Telephone Encounter (Signed)
Spoke with Dr.Jordan 03/02/13 he advised patient can wear thigh high mild compression stockings.Faxed to [facility] fax # [phone].

## 2013-03-04 NOTE — Telephone Encounter (Signed)
Spoke to patient was told ok with Dr.Jordan to take increase lopressor as prescribed by Dr.Allred.Refill sent to pharmacy.

## 2013-03-08 NOTE — Telephone Encounter (Signed)
OK to take lasix for a couple of days for swelling.  Christy Miranda Sw[place]iland MD, [facility]

## 2013-03-08 NOTE — Telephone Encounter (Signed)
New problem   Pt isn't feeling well. Pt is having swelling in both legs she stated she just doesn't feel right. Pt want to know if her new prescription medication will make her feel better that she hasn't received yet. Please call pt concerning this matter.

## 2013-03-08 NOTE — Telephone Encounter (Signed)
Returned call to patient she stated she continues to have swelling in both lower legs right is worse.States she has not been able to purchase compression stockings yet waiting on her daughter to take her.Patient wanted to ask Dr.Jordan if she could take lasix for a couple of days to get rid of the swelling.Message sent to Dr.Jordan for advice.

## 2013-03-08 NOTE — Telephone Encounter (Signed)
Spoke with patient was told Dr.Jordan advised ok to take lasix for a couple of days.Patient stated she has lasix 20 mg.Advised to take 20 mg daily for 3 days then stop.Patient stated she has been feeling awl full today.States she is weak,dizzy,nausea.B/P today 158/88 pulse 84.States she is presently taking Toprol XL 100 mg twice a day.States Dr.Allred has changed to Lopressor 100 mg twice a day.States was told to finish Toprol XL then start Lopressor.Patient anxious about changing to Lopressor.She don't understand why the change.Patient was told Dr.Jordan out of office today,will let him know 03/09/13 and call back.

## 2013-03-10 ENCOUNTER — Telehealth: Payer: Self-pay | Admitting: Cardiology

## 2013-03-10 NOTE — Telephone Encounter (Signed)
See previous 03/10/13 note. 

## 2013-03-10 NOTE — Telephone Encounter (Signed)
Patient called was told spoke to Dr.Jordan he advised to decrease toprol xl to 75 mg twice a day.Stated she has a 50 mg and a 25 mg toprol xl.States she will take what she has.States she will call back when she is ready for a refill to be sent to mail order pharmacy.Patient requested appointment to be seen.Appointment scheduled with Norma Fredrickson NP 03/29/13.

## 2013-03-10 NOTE — Addendum Note (Signed)
Addended by: Meda Klinefelter D on: 03/10/2013 06:20 PM   Modules accepted: Orders, Medications

## 2013-03-10 NOTE — Telephone Encounter (Signed)
New problem ° ° ° °Pt calling about medication  °

## 2013-03-18 ENCOUNTER — Ambulatory Visit (INDEPENDENT_AMBULATORY_CARE_PROVIDER_SITE_OTHER): Payer: Medicare Other | Admitting: *Deleted

## 2013-03-18 DIAGNOSIS — I4891 Unspecified atrial fibrillation: Secondary | ICD-10-CM

## 2013-03-21 ENCOUNTER — Encounter: Payer: Self-pay | Admitting: Internal Medicine

## 2013-03-24 ENCOUNTER — Ambulatory Visit (INDEPENDENT_AMBULATORY_CARE_PROVIDER_SITE_OTHER): Payer: Medicare Other

## 2013-03-24 DIAGNOSIS — I4891 Unspecified atrial fibrillation: Secondary | ICD-10-CM

## 2013-03-25 ENCOUNTER — Telehealth: Payer: Self-pay | Admitting: Physician Assistant

## 2013-03-25 NOTE — Telephone Encounter (Signed)
Pt called because she has LE edema. It is getting worse and the leg that had the DVT is more swollen than the other one. Her INR yesterday was 2.6. She is wearing the compression stockings but the fluid is still getting worse.  Advised her she could come to the ER, but she refused. She has Lasix at home and wanted to know if she could take it. Advised her she could take Lasix today and tomorrow. She has appt on Tues and she will keep it. Pt in agreement with this plan.

## 2013-03-28 ENCOUNTER — Telehealth: Payer: Self-pay

## 2013-03-28 ENCOUNTER — Telehealth: Payer: Self-pay | Admitting: Nurse Practitioner

## 2013-03-28 DIAGNOSIS — R609 Edema, unspecified: Secondary | ICD-10-CM

## 2013-03-28 NOTE — Telephone Encounter (Signed)
Called patient to inform her that we have ordered BMET for tomorrow prior to appointment with Lawson Fiscal at 3.  Patient verbalized understanding.  Patient states the swelling is about the same; states the RLE is worse than the LLE.  Patient denies SOB.  Advised patient that I have documented this information for her appointment tomorrow.

## 2013-03-28 NOTE — Telephone Encounter (Signed)
In [employer] triage pool

## 2013-03-28 NOTE — Telephone Encounter (Signed)
Message copied by Marcelle Overlie on Mon Mar 28, 2013  8:44 AM ------      Message from: Darrol Jump      Created: Fri Mar 25, 2013 10:25 PM       She called on Friday night, see phone note. Please call her to check on her and make sure she has a BMET at her appt on Tues.            Thanks,      Bjorn Loser ------

## 2013-03-28 NOTE — Telephone Encounter (Signed)
See below

## 2013-03-29 ENCOUNTER — Encounter: Payer: Self-pay | Admitting: Cardiovascular Disease

## 2013-03-29 ENCOUNTER — Ambulatory Visit (INDEPENDENT_AMBULATORY_CARE_PROVIDER_SITE_OTHER): Payer: Medicare Other | Admitting: Cardiovascular Disease

## 2013-03-29 ENCOUNTER — Other Ambulatory Visit: Payer: Medicare Other

## 2013-03-29 VITALS — BP 154/80 | HR 90 | Ht 60.0 in | Wt 109.0 lb

## 2013-03-29 DIAGNOSIS — I4891 Unspecified atrial fibrillation: Secondary | ICD-10-CM

## 2013-03-29 DIAGNOSIS — R609 Edema, unspecified: Secondary | ICD-10-CM

## 2013-03-29 NOTE — Progress Notes (Signed)
Christy Miranda Date of Birth: 08-Aug-1921 Medical Record #[id number]  Problem list: 1. Atrial fibrillation-sick sinus syndrome 2. Chronic Coumadin therapy 3. Permanent pacemaker 4. Right popliteal deep vein thrombosis  History of Present Illness: Christy Miranda is seen back today for a follow up visit. She is seen for Dr. Swaziland. She has multiple issues which are as outlined below. These include chronic atrial fib with tachy brady syndrome,  chronic coumadin therapy and edema. She has a pacemaker in place and has had recent generator change out. Her coumadin was held and she did develop a right popliteal DVT. Required Lovenox along with her coumadin.   She was last here in just a month ago.   She is here today. She is here alone. She has called numerous times last week. Had been to the [facility] in clinic with a bad cold/pneumonia and was told her pulse was high. Toprol was increased to 75 mg BID. Has had some changes in antibiotics and seems like she has been on Zithromax and Levaquin. Needed some direction in regards to her coumadin. Will be checking today. Still with some cough but thinks she is improving. Finished her Z pak as of Saturday. She is not dizzy or lightheaded. Little sore from coughing. No fever. Has follow up with her PCP tomorrow.   March 29, 2013:  She has had pneumonia this winter .  She is breathing better now.   She was tried on Toprol XL 200 mg a day but she feels better on her current dose of toprol 150 mg daily .  She has had some increased edema in her right thigh and calf.  She has had a right peroneal   DVT.    She took Lasix for the past 3 days.  It seemed to help her swelling some    Current Outpatient Prescriptions on File Prior to Visit  Medication Sig Dispense Refill  . ALPRAZolam (XANAX) 0.25 MG tablet Take 1 tablet (0.25 mg total) by mouth 3 (three) times daily as needed. For anxiety  30 tablet  0  . Calcium Carbonate-Vitamin D (CALCIUM-VITAMIN D) 600-200  MG-UNIT CAPS Take 1 tablet by mouth daily.       [name] levothyroxine (SYNTHROID, LEVOTHROID) 50 MCG tablet Take 50 mcg by mouth daily.        . metoprolol succinate (TOPROL XL) 25 MG 24 hr tablet Take 1 tablet (25 mg total) by mouth daily.  90 tablet  3  . metoprolol succinate (TOPROL-XL) 50 MG 24 hr tablet Take 1 tablet (50 mg total) by mouth daily. Take with or immediately following a meal.  90 tablet  3  . Multiple Vitamin (MULTIVITAMIN WITH MINERALS) TABS Take 1 tablet by mouth daily.      [name] warfarin (COUMADIN) 5 MG tablet Take 5-7.5 mg by mouth See admin instructions. Take 1 tablet on Mon and Fri. Take 1.5 tablets (7.5mg )  on Tuesday, Wednesday, Thursday, Saturday and Sunday.       No current facility-administered medications on file prior to visit.    Allergies  Allergen Reactions  . Betapace (Sotalol Hcl)     unknown    Past Medical History  Diagnosis Date  . HTN (hypertension)   . Permanent atrial fibrillation   . Bradycardia     s/p PPM  . Hypothyroidism   . Anxiety   . Diverticulitis     history of  . History of hysterectomy   . Long-term (current) use of anticoagulants   .  Scoliosis (and kyphoscoliosis), idiopathic   . DVT (deep vein thrombosis) in pregnancy     right peroneal    Past Surgical History  Procedure Laterality Date  . Pacemaker insertion  03/03/2006    most recent generator (MDT) change 10/30/06 by Dr Reyes Ivan  . Cardiac catheterization  01/08/1999    normal LV function  . Cardioversion  10/30/2006    Successful elective DC cardioversion  . Cardioversion  12/29/2002    Successful DC cardioversion  . Cardioversion  12/03/1999    Successful DC cardioversion  . Cholecystectomy      History  Smoking status  . Never Smoker   Smokeless tobacco  . Never Used    History  Alcohol Use No    Family History  Problem Relation Age of Onset  . Coronary artery disease Mother   . Cancer - Other Father     Pancreatic Cancer    Review of Systems: The  review of systems is per the HPI.  All other systems were reviewed and are negative.  Physical Exam: BP 154/80  Pulse 90  Ht 5' (1.524 m)  Wt 109 lb (49.442 kg)  BMI 21.29 kg/m2  SpO2 99% Patient is very pleasant and in no acute distress. She looks chronically ill. Quite kyphotic. Skin is warm and dry. Color is normal.  HEENT is unremarkable. Normocephalic/atraumatic. PERRL. Sclera are nonicteric. Neck is supple. No masses. No JVD. Lungs are clear. Cardiac exam shows an irregular rhythm. Rate is ok. Abdomen is soft. Extremities are without edema. Gait and ROM are intact. No gross neurologic deficits noted.  LABORATORY DATA: EKG today shows atrial fib with a controlled VR.     Chemistry      Component Value Date/Time   NA 135 01/01/2013 1828   K 4.3 01/01/2013 1828   CL 100 01/01/2013 1828   CO2 28 01/01/2013 1828   BUN 15 01/01/2013 1828   CREATININE 0.51 01/01/2013 1828      Component Value Date/Time   CALCIUM 8.9 01/01/2013 1828   ALKPHOS 55 01/01/2013 1828   AST 36 01/01/2013 1828   ALT 18 01/01/2013 1828   BILITOT 0.3 01/01/2013 1828     Lab Results  Component Value Date   WBC 3.5* 01/01/2013   HGB 12.0 01/01/2013   HCT 36.1 01/01/2013   MCV 95.3 01/01/2013   PLT 142* 01/01/2013   Assessment / Plan: 1. Chronic atrial fib - rate has improved with the extra Toprol. I have left her on her current regimen.  2. Chronic coumadin - rechecking today  3. URI/pneumonia - lungs are clearer as she coughs. Has finished her antibiotics. She sees her PCP tomorrow  4. Underlying pacemaker - sees EP next month  Patient is agreeable to this plan and will call if any problems develop in the interim.

## 2013-03-29 NOTE — Assessment & Plan Note (Addendum)
She has significant left leg edema.  She has a hx of DVT in her right peroneal vein.  I have encouraged her to elevate her legs several times throughout the day. I've also encouraged her to buy some tighter compression hose.  We'll check a basic metabolic profile/that she's been on Lasix for the past 3 days. She does not appear to be volume depleted.  It appears that th 3 days of Lasix has resolved her leg edema.   We may need to use this strategy again .  We will use this after she has tried leg elevation and compression hose.

## 2013-03-29 NOTE — Assessment & Plan Note (Signed)
She has chronic atrial fibrillation. She seems to be stable.

## 2013-03-29 NOTE — Patient Instructions (Addendum)
Lab work today     Your physician recommends that you schedule a follow-up appointment in: 2 months with Dr.Jordan

## 2013-03-30 LAB — BASIC METABOLIC PANEL
BUN: 18 mg/dL (ref 6–23)
CO2: 31 mEq/L (ref 19–32)
Calcium: 8.9 mg/dL (ref 8.4–10.5)
Glucose, Bld: 92 mg/dL (ref 70–99)
Potassium: 4.5 mEq/L (ref 3.5–5.1)
Sodium: 135 mEq/L (ref 135–145)

## 2013-03-31 ENCOUNTER — Telehealth: Payer: Self-pay | Admitting: Cardiology

## 2013-03-31 NOTE — Telephone Encounter (Signed)
New Problem:    Patient called in wanting to know the results of her latest Labs.  Please call back.

## 2013-04-01 ENCOUNTER — Telehealth: Payer: Self-pay | Admitting: *Deleted

## 2013-04-01 NOTE — Telephone Encounter (Signed)
New Prob     Pt has some questions regarding per pacemaker. She states it is protruding and would like to speak to nurse regarding this.

## 2013-04-01 NOTE — Telephone Encounter (Signed)
Pt concerned with pacer pocket site. States that the pacer has shifted and one corner is trying to push out through the skin. Skin is now thin in this area. Denies break through, no redness, no soreness at site. Pt aware DR/Nurse out of office today but would like to be advised. Please call pt back.

## 2013-04-01 NOTE — Telephone Encounter (Signed)
Pt calls today b/c she noticed yesterday that where her pacer site is she felt a small pointy area. Denies fever, chills, no break through of skin, no drainage.  Advised pt to call our office back in the event she experienced any of the above symptoms or as she would prefer to go to the ED. Reassurance given to pt.  Will send this message also to device nurses to call pt back Monday. Pt agrees with this plan. She has family staying with her.  Mylo Red RN

## 2013-04-04 NOTE — Telephone Encounter (Signed)
Tried calling pt and N/A. No voice mail set up.

## 2013-04-04 NOTE — Telephone Encounter (Signed)
Pt scheduled for wound check 04-07-13 @ 1615/kwm

## 2013-04-07 ENCOUNTER — Ambulatory Visit (INDEPENDENT_AMBULATORY_CARE_PROVIDER_SITE_OTHER): Payer: Medicare Other | Admitting: *Deleted

## 2013-04-07 DIAGNOSIS — I495 Sick sinus syndrome: Secondary | ICD-10-CM

## 2013-04-08 DIAGNOSIS — IMO0002 Reserved for concepts with insufficient information to code with codable children: Secondary | ICD-10-CM | POA: Diagnosis not present

## 2013-04-08 DIAGNOSIS — B351 Tinea unguium: Secondary | ICD-10-CM | POA: Diagnosis not present

## 2013-04-08 DIAGNOSIS — H1130 Conjunctival hemorrhage, unspecified eye: Secondary | ICD-10-CM | POA: Diagnosis not present

## 2013-04-08 DIAGNOSIS — Z79899 Other long term (current) drug therapy: Secondary | ICD-10-CM | POA: Diagnosis not present

## 2013-04-11 NOTE — Progress Notes (Signed)
Wound check due to device being superficial. Pt evaluated by Dr Ladona Ridgel. Pt to call with any changes and followup as planned.

## 2013-04-15 ENCOUNTER — Ambulatory Visit (INDEPENDENT_AMBULATORY_CARE_PROVIDER_SITE_OTHER): Payer: Medicare Other

## 2013-04-15 DIAGNOSIS — I4891 Unspecified atrial fibrillation: Secondary | ICD-10-CM

## 2013-04-15 LAB — POCT INR: INR: 2.2

## 2013-05-02 ENCOUNTER — Telehealth: Payer: Self-pay | Admitting: Cardiology

## 2013-05-02 DIAGNOSIS — M79609 Pain in unspecified limb: Secondary | ICD-10-CM | POA: Diagnosis not present

## 2013-05-02 DIAGNOSIS — B351 Tinea unguium: Secondary | ICD-10-CM | POA: Diagnosis not present

## 2013-05-02 NOTE — Telephone Encounter (Signed)
New Problem  Pt is concerned about an antibiotic (Cethalexin) that was prescribed by another Doctor. She said she does not want to have it filled it Dr Swaziland doesn't think she should take it.

## 2013-05-02 NOTE — Telephone Encounter (Signed)
Returned call to patient she stated she slipped on a towel in bathroom this morning and fell.Stated she hit her head on wall.Stated she has soreness in upper and lower back.Stated she has appointment with PCP tomorrow 05/03/13.No hematoma noticed on head.Stated she went to foot Dr this afternoon for infected left great toe,cephalexin 500 mg twice a day for 10 days prescribed.Patient was told coumadin clinic will call her back tomorrow for advice on INR to be done sooner.

## 2013-05-03 DIAGNOSIS — Z79899 Other long term (current) drug therapy: Secondary | ICD-10-CM | POA: Diagnosis not present

## 2013-05-03 DIAGNOSIS — M549 Dorsalgia, unspecified: Secondary | ICD-10-CM | POA: Diagnosis not present

## 2013-05-03 DIAGNOSIS — H1130 Conjunctival hemorrhage, unspecified eye: Secondary | ICD-10-CM | POA: Diagnosis not present

## 2013-05-03 NOTE — Telephone Encounter (Signed)
Cephalexin does not have an interaction with Coumadin.  Advised pt does not need sooner INR check.  Pt verbalized understanding seeing primary MD today 05/03/13.  Pt WCB if any new medications rx.

## 2013-05-04 ENCOUNTER — Telehealth: Payer: Self-pay | Admitting: *Deleted

## 2013-05-04 NOTE — Telephone Encounter (Signed)
Pt called stating that she went to PCP today and he did INR and she states it was 3.1 Pt states she has not had any more bleeding since her fall and pt instructed to eat some dark leafy greens to her tolerance today and tomorrow and did make her an appt to be seen in clinic on Friday 05/06/2013 due to her fall and her concerns . Pt states understanding.

## 2013-05-06 ENCOUNTER — Ambulatory Visit (INDEPENDENT_AMBULATORY_CARE_PROVIDER_SITE_OTHER): Payer: Medicare Other | Admitting: Pharmacist

## 2013-05-06 DIAGNOSIS — I4891 Unspecified atrial fibrillation: Secondary | ICD-10-CM

## 2013-05-13 ENCOUNTER — Ambulatory Visit (INDEPENDENT_AMBULATORY_CARE_PROVIDER_SITE_OTHER): Payer: Medicare Other | Admitting: Cardiology

## 2013-05-13 ENCOUNTER — Ambulatory Visit: Payer: Medicare Other | Admitting: Cardiology

## 2013-05-13 ENCOUNTER — Encounter: Payer: Self-pay | Admitting: Cardiology

## 2013-05-13 VITALS — BP 132/74 | HR 82 | Ht 60.0 in | Wt 103.0 lb

## 2013-05-13 DIAGNOSIS — Z7901 Long term (current) use of anticoagulants: Secondary | ICD-10-CM

## 2013-05-13 DIAGNOSIS — I4891 Unspecified atrial fibrillation: Secondary | ICD-10-CM | POA: Diagnosis not present

## 2013-05-13 DIAGNOSIS — I495 Sick sinus syndrome: Secondary | ICD-10-CM

## 2013-05-13 DIAGNOSIS — I824Z9 Acute embolism and thrombosis of unspecified deep veins of unspecified distal lower extremity: Secondary | ICD-10-CM | POA: Diagnosis not present

## 2013-05-13 DIAGNOSIS — I824Z1 Acute embolism and thrombosis of unspecified deep veins of right distal lower extremity: Secondary | ICD-10-CM

## 2013-05-13 DIAGNOSIS — I10 Essential (primary) hypertension: Secondary | ICD-10-CM | POA: Diagnosis not present

## 2013-05-13 MED ORDER — ALPRAZOLAM 0.25 MG PO TABS: 0.2500 mg | ORAL_TABLET | Freq: Three times a day (TID) | ORAL | Status: DC | PRN

## 2013-05-14 NOTE — Progress Notes (Signed)
Christy Miranda Date of Birth: 04/01/1921   History of Present Illness: Christy Miranda and is seen today for followup. She history of atrial fibrillation with sick sinus syndrome. She is status post pacemaker implant with revision of her generator in January 2014. Following this procedure she developed a DVT. She remains on Coumadin. She feels well from a cardiac standpoint but has a number of other concerns. She complains of bouts of nausea after breakfast. She has had some weight loss. She still has some swelling in her right leg and does use a compression stocking. She has significant fungal infection of her toe. She has received several courses of antibiotics over the past few months.   Current Outpatient Prescriptions on File Prior to Visit  Medication Sig Dispense Refill  . Calcium Carbonate-Vitamin D (CALCIUM-VITAMIN D) 600-200 MG-UNIT CAPS Take 1 tablet by mouth daily.       [name] levothyroxine (SYNTHROID, LEVOTHROID) 50 MCG tablet Take 50 mcg by mouth daily.       . metoprolol succinate (TOPROL XL) 25 MG 24 hr tablet Take 25 mg by mouth daily. 2 tabs a day  90 tablet  3  . metoprolol succinate (TOPROL-XL) 50 MG 24 hr tablet Take 50 mg by mouth daily. Take with or immediately following a meal. 2 tabs a day.  90 tablet  3  . Multiple Vitamin (MULTIVITAMIN WITH MINERALS) TABS Take 1 tablet by mouth daily.      [name] warfarin (COUMADIN) 5 MG tablet Take 5-7.5 mg by mouth See admin instructions. Take 1 tablet on Mon and Fri. Take 1.5 tablets (7.5mg )  on Tuesday, Wednesday, Thursday, Saturday and Sunday.       No current facility-administered medications on file prior to visit.    Allergies  Allergen Reactions  . Betapace (Sotalol Hcl)     unknown    Past Medical History  Diagnosis Date  . HTN (hypertension)   . Permanent atrial fibrillation   . Bradycardia     s/p PPM  . Hypothyroidism   . Anxiety   . Diverticulitis     history of  . History of hysterectomy   . Long-term (current) use  of anticoagulants   . Scoliosis (and kyphoscoliosis), idiopathic   . DVT (deep vein thrombosis) in pregnancy     right peroneal    Past Surgical History  Procedure Laterality Date  . Pacemaker insertion  03/03/2006    most recent generator (MDT) change 10/30/06 by Dr Reyes Ivan  . Cardiac catheterization  01/08/1999    normal LV function  . Cardioversion  10/30/2006    Successful elective DC cardioversion  . Cardioversion  12/29/2002    Successful DC cardioversion  . Cardioversion  12/03/1999    Successful DC cardioversion  . Cholecystectomy      History  Smoking status  . Never Smoker   Smokeless tobacco  . Never Used    History  Alcohol Use No    Family History  Problem Relation Age of Onset  . Coronary artery disease Mother   . Cancer - Other Father     Pancreatic Cancer    Review of Systems: As noted in history of present illness. Pacer site has healed well. All other systems were reviewed and are negative.  Physical Exam: BP 132/74  Pulse 82  Ht 5' (1.524 m)  Wt 103 lb (46.72 kg)  BMI 20.12 kg/m2 She is a pleasant elderly white female in no acute distress. HEENT is normal. Neck is supple without  JVD, adenopathy, thyromegaly, or bruits. Lungs are clear. Cardiac exam reveals a irregular rate and rhythm without gallop, murmur, or click. Her pacemaker site has healed completely. Abdomen is soft and nontender. Spine reveals kyphoscoliosis. She has 1+ right calf edema. Pulses are 2+. she has a fungal infection of her left great toenail. This does not otherwise appear to be infected.  Skin is warm and dry. She is alert and oriented x3. Cranial nerves II through XII are intact.   LABORATORY DATA: INR 2.8 on 05/06/2013   Assessment / Plan: 1. Tachybradycardia syndrome. Heart rate is well controlled on metoprolol. She is on chronic anticoagulation with Coumadin with therapeutic INRs. next INR check on June 27.  2. Hypertension, controlled.  3. Right calf DVT. Continue  coumadin. Recommend support hose for swelling.  4. Weight loss and morning nausea. Patient is planning to followup with Dr. Matthias Hughs concerning these symptoms.

## 2013-05-16 ENCOUNTER — Emergency Department (HOSPITAL_COMMUNITY)
Admission: EM | Admit: 2013-05-16 | Discharge: 2013-05-17 | Disposition: A | Discharge status: Home / Self Care | Payer: Medicare Other | Attending: Emergency Medicine | Admitting: Emergency Medicine

## 2013-05-16 ENCOUNTER — Telehealth: Payer: Self-pay | Admitting: Cardiology

## 2013-05-16 ENCOUNTER — Encounter (HOSPITAL_COMMUNITY): Payer: Self-pay | Admitting: *Deleted

## 2013-05-16 DIAGNOSIS — I10 Essential (primary) hypertension: Secondary | ICD-10-CM | POA: Insufficient documentation

## 2013-05-16 DIAGNOSIS — Z79899 Other long term (current) drug therapy: Secondary | ICD-10-CM | POA: Diagnosis not present

## 2013-05-16 DIAGNOSIS — Z7901 Long term (current) use of anticoagulants: Secondary | ICD-10-CM | POA: Insufficient documentation

## 2013-05-16 DIAGNOSIS — Z8679 Personal history of other diseases of the circulatory system: Secondary | ICD-10-CM | POA: Diagnosis not present

## 2013-05-16 DIAGNOSIS — F411 Generalized anxiety disorder: Secondary | ICD-10-CM | POA: Diagnosis not present

## 2013-05-16 DIAGNOSIS — M4120 Other idiopathic scoliosis, site unspecified: Secondary | ICD-10-CM | POA: Diagnosis not present

## 2013-05-16 DIAGNOSIS — I4891 Unspecified atrial fibrillation: Secondary | ICD-10-CM | POA: Diagnosis not present

## 2013-05-16 DIAGNOSIS — H43392 Other vitreous opacities, left eye: Secondary | ICD-10-CM

## 2013-05-16 DIAGNOSIS — H43399 Other vitreous opacities, unspecified eye: Secondary | ICD-10-CM | POA: Diagnosis not present

## 2013-05-16 DIAGNOSIS — E039 Hypothyroidism, unspecified: Secondary | ICD-10-CM | POA: Insufficient documentation

## 2013-05-16 DIAGNOSIS — R42 Dizziness and giddiness: Secondary | ICD-10-CM | POA: Insufficient documentation

## 2013-05-16 DIAGNOSIS — H539 Unspecified visual disturbance: Secondary | ICD-10-CM | POA: Diagnosis not present

## 2013-05-16 DIAGNOSIS — Z86718 Personal history of other venous thrombosis and embolism: Secondary | ICD-10-CM | POA: Insufficient documentation

## 2013-05-16 DIAGNOSIS — I7090 Unspecified atherosclerosis: Secondary | ICD-10-CM | POA: Diagnosis not present

## 2013-05-16 DIAGNOSIS — I69998 Other sequelae following unspecified cerebrovascular disease: Secondary | ICD-10-CM | POA: Insufficient documentation

## 2013-05-16 LAB — CBC
Hemoglobin: 12.4 g/dL (ref 12.0–15.0)
MCH: 30.8 pg (ref 26.0–34.0)
MCHC: 34 g/dL (ref 30.0–36.0)

## 2013-05-16 LAB — BASIC METABOLIC PANEL
BUN: 11 mg/dL (ref 6–23)
Calcium: 9 mg/dL (ref 8.4–10.5)
Creatinine, Ser: 0.46 mg/dL — ABNORMAL LOW (ref 0.50–1.10)
GFR calc non Af Amer: 86 mL/min — ABNORMAL LOW (ref >90)
Glucose, Bld: 98 mg/dL (ref 70–99)
Sodium: 135 mEq/L (ref 135–145)

## 2013-05-16 NOTE — Telephone Encounter (Signed)
Returned call to patient she stated she has not felt good today.Stated she has been dizzy,nausea.B/P 136/76 pulse 82,138/78 pulse 82.Stated yesterday she had a " star burst " in left eye.States it is like a bright flash of light in left eye.Stated she has had that before.States she is very weak today.No chest pain.No sob.Advised to call PCP.Stated she did call and was told she could go to their walk in clinic this afternoon.Advised to go to their walk in clinic.Will let Dr.Jordan when he is in office 05/17/13.

## 2013-05-16 NOTE — ED Notes (Addendum)
Dizziness, that started yesterday. Rooms not spinning.[name]"I'm in haze and just want to sit down somewhere; hard to look down; weakness; star burst visual in eye." did fall x 2 weeks ago. Pacemaker checked Friday and all good.

## 2013-05-16 NOTE — Telephone Encounter (Signed)
New Problem  Pt states she has some question regarding her medication. She asked if you could please give her a call back.

## 2013-05-16 NOTE — ED Provider Notes (Signed)
History     CSN: [id number]  Arrival date & time 05/16/13  2152   First MD Initiated Contact with Patient 05/16/13 2342      Chief Complaint  Patient presents with  . Dizziness   HPI Christy Miranda is a 79 y.o. female with a history of vague dizziness previously seen in the ER presents with dizziness that started yesterday. She describes this dizziness As a "haze" which makes her feel like she needs to sit down which resolves it. She says this worsens when she looks down this is associated with some nausea. There is no associated sensation of movement, there's been no vomiting. She's had no recent illness, no recent ear pain, sinus pain or pressure. No neck pain or stiffness. She's had this quite frequently in the past however is only last a few hours during the day but is been more frequent during the last day. She's difficulty explaining her sensation she says when she bends her head down it's "like I'm in a fog. She also complains about some left intermittent flashers, they go away spontaneously. There's been no visual loss, no curtain coming over her vision. She does have a history of cataracts. She does not feel like she is going to pass out, she's had no palpitations, no chest pain, no shortness of breath, no abdominal pain, no diarrhea, no lower extremity swelling, no new arthralgias or myalgias, no rash. Patient had a fall 2 weeks ago where she fell against the wall and slid down, she did not injure herself and did not lose consciousness, did not hit her head. Patient saw her cardiologist Dr. [place] last Friday.  Patient does currently have a pacemaker: [employer] 11/09/2012 serial [id number] H Model [id number] Sensia SR PM Allred is Cards  Past Medical History  Diagnosis Date  . HTN (hypertension)   . Permanent atrial fibrillation   . Bradycardia     s/p PPM  . Hypothyroidism   . Anxiety   . Diverticulitis     history of  . History of hysterectomy   . Long-term (current) use of  anticoagulants   . Scoliosis (and kyphoscoliosis), idiopathic   . DVT (deep vein thrombosis) in pregnancy     right peroneal    Past Surgical History  Procedure Laterality Date  . Pacemaker insertion  03/03/2006    most recent generator (MDT) change 10/30/06 by Dr Reyes Ivan  . Cardiac catheterization  01/08/1999    normal LV function  . Cardioversion  10/30/2006    Successful elective DC cardioversion  . Cardioversion  12/29/2002    Successful DC cardioversion  . Cardioversion  12/03/1999    Successful DC cardioversion  . Cholecystectomy      Family History  Problem Relation Age of Onset  . Coronary artery disease Mother   . Cancer - Other Father     Pancreatic Cancer    History  Substance Use Topics  . Smoking status: Never Smoker   . Smokeless tobacco: Never Used  . Alcohol Use: No    OB History   Grav Para Term Preterm Abortions TAB SAB Ect Mult Living                  Review of Systems At least 10pt or greater review of systems completed and are negative except where specified in the HPI.  Allergies  Betapace  Home Medications   Current Outpatient Rx  Name  Route  Sig  Dispense  Refill  . ALPRAZolam (XANAX) 0.25 MG tablet  Oral   Take 1 tablet (0.25 mg total) by mouth 3 (three) times daily as needed. For anxiety   30 tablet   0   . Calcium Carbonate-Vitamin D (CALCIUM-VITAMIN D) 600-200 MG-UNIT CAPS   Oral   Take 1 tablet by mouth daily.          [name] levothyroxine (SYNTHROID, LEVOTHROID) 50 MCG tablet   Oral   Take 50 mcg by mouth daily.          . metoprolol succinate (TOPROL XL) 25 MG 24 hr tablet   Oral   Take 25 mg by mouth 2 (two) times daily. Take with 50 mg to equal 75 mg dose twice daily   90 tablet   3   . metoprolol succinate (TOPROL-XL) 50 MG 24 hr tablet   Oral   Take 50 mg by mouth 2 (two) times daily. Take with 25 mg dose to equal 75 mg twice daily   90 tablet   3   . Multiple Vitamin (MULTIVITAMIN WITH MINERALS) TABS    Oral   Take 1 tablet by mouth daily.         [name] warfarin (COUMADIN) 5 MG tablet   Oral   Take 5-7.5 mg by mouth See admin instructions. Take 1 tablet on Mon and Fri. Take 1.5 tablets (7.5mg )  on Tuesday, Wednesday, Thursday, Saturday and Sunday.           BP 157/64  Pulse 86  Temp(Src) 97.8 F (36.6 C) (Oral)  Resp 16  SpO2 98%  Physical Exam  PHYSICAL EXAM: VITAL SIGNS:  . Filed Vitals:   05/17/13 0315 05/17/13 0330 05/17/13 0345 05/17/13 0350  BP: 160/71 139/75 150/67 140/72  Pulse: 86 83 88 84  Temp:    97.9 F (36.6 C)  TempSrc:      Resp: 26 13 20    SpO2: 96% 94% 96% 96%   CONSTITUTIONAL: Awake, oriented, appears non-toxic HENT: Atraumatic, normocephalic, oral mucosa pink and moist, airway patent. Nares patent without drainage. External ears normal. EYES: Conjunctiva clear, bilateral cataracts, EOMI, PERRLA NECK: Trachea midline, non-tender, supple CARDIOVASCULAR: Normal heart rate, Normal rhythm, No murmurs, rubs, gallops PULMONARY/CHEST: Clear to auscultation, no rhonchi, wheezes, or rales. Symmetrical breath sounds. CHEST WALL: No lesions. Non-tender. ABDOMINAL: Non-distended, soft, non-tender - no rebound or guarding.  BS normal. BACK: No step-offs, nontender to palpation in the midline, no skin abnormalities. Kyphosis and scoliosis, loss of cervical lordosis. NEUROLOGIC: WU:[id number] fields intact. PERRLA, EOMI.  Facial sensation equal to light touch bilaterally.  Good muscle bulk in the masseter muscle and good lateral movement of the jaw.  Facial expressions equal and good strength with smile/frown and puffed cheeks.  Hearing grossly intact to finger rub test.  Uvula, tongue are midline with no deviation. Symmetrical palate elevation.  Trapezius and SCM muscles are 5/5 strength bilaterally.   DTR: Brachioradialis, biceps, patellar, Achilles tendon reflexes 2+ bilaterally.  No clonus. Strength: 5/5 strength flexors and extensors in the upper and lower extremities.   Grip strength, finger adduction/abduction 5/5. Sensation: Sensation intact distally to light touch  Cerebellar: No ataxia with walking or dysmetria with finger to nose, rapid alternating hand movements and heels to shin testing. Gait and Station: Patient's gait is small steps.  April 10 daily with minimal assistance-she uses a walker at home. EXTREMITIES: No clubbing, cyanosis, or edema SKIN: Warm, Dry, No erythema, No rash   ED Course  Procedures (including critical care time)  Date: 05/17/2013  Rate: 84  Rhythm:  Atrial fibrillation  QRS Axis: normal  Intervals: normal  ST/T Wave abnormalities: T-wave inversions in the inferior leads as well as V6-seen on prior EKG from 01/24/2013  Conduction Disutrbances: none  Narrative Interpretation: No significant morphological change from prior EKG, nonischemic EKG.   Labs Reviewed  CBC - Abnormal; Notable for the following:    WBC 3.4 (*)    RDW 16.1 (*)    Platelets 149 (*)    All other components within normal limits  BASIC METABOLIC PANEL - Abnormal; Notable for the following:    Creatinine, Ser 0.46 (*)    GFR calc non Af Amer 86 (*)    All other components within normal limits  PROTIME-INR - Abnormal; Notable for the following:    Prothrombin Time 25.1 (*)    INR 2.41 (*)    All other components within normal limits  POCT I-STAT TROPONIN I   Ct Angio Head W/cm &/or Wo Cm  05/17/2013   *RADIOLOGY REPORT*  Clinical Data:  Dizziness.  CT ANGIOGRAPHY HEAD AND NECK  Technique:  Multidetector CT imaging of the head and neck was performed using the standard protocol during bolus administration of intravenous contrast.  Multiplanar CT image reconstructions including MIPs were obtained to evaluate the vascular anatomy. Carotid stenosis measurements (when applicable) are obtained utilizing NASCET criteria, using the distal internal carotid diameter as the denominator.  Contrast: 50mL OMNIPAQUE IOHEXOL 350 MG/ML SOLN  Comparison:  Head CT  02/04/2011.  CTA NECK  Findings:  Normal three-vessel arch with mild atherosclerosis. Right vertebral dominance.  No evidence of hemodynamically significant stenosis or dissection and either of the vertebral arteries.  The common carotid arteries and internal carotid arteries are both widely patent without evidence of hemodynamically significant stenosis or dissection. No focal soft tissue abnormality.  Visualized portions of the upper thorax are remarkable for mild bilateral apical pleuroparenchymal thickening, most compatible with scarring.  A right-sided pacemaker device is in place, but incompletely visualized.   Review of the MIP images confirms the above findings.  IMPRESSION: 1.  No acute vascular abnormality in the neck to account for the patient's symptoms.  Specifically, no evidence of aneurysm, dissection or significant stenosis in the carotid or vertebral basilar system.  CTA HEAD  Findings:  Initial noncontrast images of the head demonstrates some mild cerebral and cerebellar atrophy with patchy and confluent areas of decreased attenuation throughout the deep and periventricular white matter of the cerebral hemispheres bilaterally, most compatible with chronic microvascular ischemic disease. No acute intracranial abnormalities.  Specifically, no evidence of acute intracranial hemorrhage, no definite findings of acute/subacute cerebral ischemia, no mass, mass effect, hydrocephalus or abnormal intra or extra-axial fluid collections. Visualized paranasal sinuses and mastoids are well pneumatized.  No acute displaced skull fractures are identified.  CTA images demonstrate right vertebral artery dominance.  Basilar artery is patent without aneurysm or dissection.  Internal carotid arteries are patent without evidence of hemodynamically significant stenosis or dissection through the carotid siphons.  Circle of Willis appears complete.  No major vascular occlusion identified.   Review of the MIP images  confirms the above findings.  IMPRESSION: 1.  Negative CTA of the brain. 2.  No acute intracranial abnormalities. 3.  Mild cerebral and cerebellar atrophy with chronic microvascular ischemic changes in the cerebral white matter redemonstrated, as above.   Original Report Authenticated By: Trudie Reed, M.D.   Ct Angio Neck W/cm &/or Wo/cm  05/17/2013   *RADIOLOGY REPORT*  Clinical Data:  Dizziness.  CT ANGIOGRAPHY  HEAD AND NECK  Technique:  Multidetector CT imaging of the head and neck was performed using the standard protocol during bolus administration of intravenous contrast.  Multiplanar CT image reconstructions including MIPs were obtained to evaluate the vascular anatomy. Carotid stenosis measurements (when applicable) are obtained utilizing NASCET criteria, using the distal internal carotid diameter as the denominator.  Contrast: 50mL OMNIPAQUE IOHEXOL 350 MG/ML SOLN  Comparison:  Head CT 02/04/2011.  CTA NECK  Findings:  Normal three-vessel arch with mild atherosclerosis. Right vertebral dominance.  No evidence of hemodynamically significant stenosis or dissection and either of the vertebral arteries.  The common carotid arteries and internal carotid arteries are both widely patent without evidence of hemodynamically significant stenosis or dissection. No focal soft tissue abnormality.  Visualized portions of the upper thorax are remarkable for mild bilateral apical pleuroparenchymal thickening, most compatible with scarring.  A right-sided pacemaker device is in place, but incompletely visualized.   Review of the MIP images confirms the above findings.  IMPRESSION: 1.  No acute vascular abnormality in the neck to account for the patient's symptoms.  Specifically, no evidence of aneurysm, dissection or significant stenosis in the carotid or vertebral basilar system.  CTA HEAD  Findings:  Initial noncontrast images of the head demonstrates some mild cerebral and cerebellar atrophy with patchy and  confluent areas of decreased attenuation throughout the deep and periventricular white matter of the cerebral hemispheres bilaterally, most compatible with chronic microvascular ischemic disease. No acute intracranial abnormalities.  Specifically, no evidence of acute intracranial hemorrhage, no definite findings of acute/subacute cerebral ischemia, no mass, mass effect, hydrocephalus or abnormal intra or extra-axial fluid collections. Visualized paranasal sinuses and mastoids are well pneumatized.  No acute displaced skull fractures are identified.  CTA images demonstrate right vertebral artery dominance.  Basilar artery is patent without aneurysm or dissection.  Internal carotid arteries are patent without evidence of hemodynamically significant stenosis or dissection through the carotid siphons.  Circle of Willis appears complete.  No major vascular occlusion identified.   Review of the MIP images confirms the above findings.  IMPRESSION: 1.  Negative CTA of the brain. 2.  No acute intracranial abnormalities. 3.  Mild cerebral and cerebellar atrophy with chronic microvascular ischemic changes in the cerebral white matter redemonstrated, as above.   Original Report Authenticated By: Trudie Reed, M.D.     1. Dizziness   2. Flashers or floaters of left eye       MDM  Jaanai Salemi is a 79 y.o. female is been seen in the emergency department previously for similar complaints. She says she does have associated nausea it is been prolonged with this episode. Patient has not had an MRI secondary to her pacemaker. She has had previous CT angiograms which were negative.  My suspicion at this point is low for posterior circulation issue, she's had this problem previously, it may just be progressing however, given her progression, we'll obtain a another CT angiogram of the posterior circulation looking for any aneurysms or blockages that could be causing her symptoms when she bends her head forward.    EKG  shows rate-controlled atrial fibrillation, patient's INR is therapeutic. Labs are otherwise unremarkable. CT angiogram appears unchanged. No focal lesions.  I had a discussion with the patient and her daughter about staying in the hospital versus going home. I do not think immediate threat exists for this patient, she is on Coumadin which reduces her risk for stroke given her afib and she has a pacer, and  there is no evidence for aneurysm. There is no evidence for bleeding. Patient is able to ambulate and does well with that.  Unfortunately I'm not able to find a diagnosis for this patient at this time, I do think she needs to followup with her primary care physician and may need a referral to neurology. Pt appears well, VSS and with the exception of mild hypertension (she hasn't taken her metoprolol tonight) are WNL. Her symptoms are not completely consistent with vertigo and I was unable to recreate symptoms in the ER; however, she may have a vertiginous component to this or middle ear disturbance.  Patient and daughter understand and accept this medical plan as dictated, else understand the [facility] ECT was going home however I do feel that this is an acceptable plan for the patient is a may pose a greater risk for this patient to be hospitalized with minimal benefit.  I explained the diagnosis and have given explicit precautions to return to the ER including worsening nausea, vomiting, loss of balance, some loss of vision, some loss of strength, dysarthria, coughing or choking, chest pain or any other new or worsening symptoms. The patient understands and accepts the medical plan as it's been dictated and I have answered their questions. Discharge instructions concerning home care and prescriptions have been given.  The patient is STABLE and is discharged to home in good condition.          Jones Skene, MD 05/18/13 [phone]

## 2013-05-17 ENCOUNTER — Emergency Department (HOSPITAL_COMMUNITY): Payer: Medicare Other

## 2013-05-17 ENCOUNTER — Encounter (HOSPITAL_COMMUNITY): Payer: Self-pay | Admitting: Radiology

## 2013-05-17 DIAGNOSIS — R42 Dizziness and giddiness: Secondary | ICD-10-CM | POA: Diagnosis not present

## 2013-05-17 DIAGNOSIS — I7090 Unspecified atherosclerosis: Secondary | ICD-10-CM | POA: Diagnosis not present

## 2013-05-17 MED ORDER — IOHEXOL 350 MG/ML SOLN
50.0000 mL | Freq: Once | INTRAVENOUS | Status: AC | PRN
  Administered 2013-05-17: 50 mL via INTRAVENOUS

## 2013-05-17 NOTE — ED Notes (Signed)
Removed all diagnostic leads. DC'd IV.

## 2013-05-17 NOTE — ED Notes (Signed)
Pt st's she started feeling dizzy last pm.  St's she felt like she was gonna fall if she looked down.  Pt alert and oriented x's 3, skin warm and dry, color appropriate.

## 2013-05-17 NOTE — Telephone Encounter (Signed)
Returned call to patient she stated she went to [facility] ER 05/16/13.Stated she had head CT and it was normal.Stated she was advised to see eye Dr.Stated she was feeling better today. Advised to call back if needed.

## 2013-05-17 NOTE — Telephone Encounter (Signed)
Follow up  Pt states she is returning your call. She asked if you could call her back.

## 2013-05-17 NOTE — ED Notes (Signed)
Pt ambulatory to bathroom with assistance without any problems.

## 2013-05-23 DIAGNOSIS — G43109 Migraine with aura, not intractable, without status migrainosus: Secondary | ICD-10-CM | POA: Diagnosis not present

## 2013-05-23 DIAGNOSIS — H43819 Vitreous degeneration, unspecified eye: Secondary | ICD-10-CM | POA: Diagnosis not present

## 2013-05-23 DIAGNOSIS — H2510 Age-related nuclear cataract, unspecified eye: Secondary | ICD-10-CM | POA: Diagnosis not present

## 2013-05-26 ENCOUNTER — Ambulatory Visit (INDEPENDENT_AMBULATORY_CARE_PROVIDER_SITE_OTHER): Payer: Medicare Other | Admitting: *Deleted

## 2013-05-26 DIAGNOSIS — I4891 Unspecified atrial fibrillation: Secondary | ICD-10-CM | POA: Diagnosis not present

## 2013-05-27 DIAGNOSIS — R350 Frequency of micturition: Secondary | ICD-10-CM | POA: Diagnosis not present

## 2013-05-27 DIAGNOSIS — M4000 Postural kyphosis, site unspecified: Secondary | ICD-10-CM | POA: Diagnosis not present

## 2013-05-27 DIAGNOSIS — E7800 Pure hypercholesterolemia, unspecified: Secondary | ICD-10-CM | POA: Diagnosis not present

## 2013-05-27 DIAGNOSIS — M899 Disorder of bone, unspecified: Secondary | ICD-10-CM | POA: Diagnosis not present

## 2013-05-27 DIAGNOSIS — E559 Vitamin D deficiency, unspecified: Secondary | ICD-10-CM | POA: Diagnosis not present

## 2013-05-27 DIAGNOSIS — M949 Disorder of cartilage, unspecified: Secondary | ICD-10-CM | POA: Diagnosis not present

## 2013-05-27 DIAGNOSIS — I10 Essential (primary) hypertension: Secondary | ICD-10-CM | POA: Diagnosis not present

## 2013-05-27 DIAGNOSIS — I4891 Unspecified atrial fibrillation: Secondary | ICD-10-CM | POA: Diagnosis not present

## 2013-05-27 DIAGNOSIS — E039 Hypothyroidism, unspecified: Secondary | ICD-10-CM | POA: Diagnosis not present

## 2013-05-27 DIAGNOSIS — Z0000 Encounter for general adult medical examination without abnormal findings: Secondary | ICD-10-CM | POA: Diagnosis not present

## 2013-05-27 DIAGNOSIS — F411 Generalized anxiety disorder: Secondary | ICD-10-CM | POA: Diagnosis not present

## 2013-06-22 ENCOUNTER — Telehealth: Payer: Self-pay | Admitting: *Deleted

## 2013-06-22 MED ORDER — ALPRAZOLAM 0.25 MG PO TABS: 0.2500 mg | ORAL_TABLET | Freq: Three times a day (TID) | ORAL | Status: DC | PRN

## 2013-06-22 MED ORDER — METOPROLOL SUCCINATE ER 25 MG PO TB24: 25.0000 mg | ORAL_TABLET | Freq: Two times a day (BID) | ORAL | Status: DC

## 2013-06-22 MED ORDER — METOPROLOL SUCCINATE ER 50 MG PO TB24: 50.0000 mg | ORAL_TABLET | Freq: Two times a day (BID) | ORAL | Status: DC

## 2013-06-22 NOTE — Telephone Encounter (Signed)
Returned call to patient- no answer.

## 2013-06-22 NOTE — Addendum Note (Signed)
Addended by: Meda Klinefelter D on: 06/22/2013 06:24 PM   Modules accepted: Orders

## 2013-06-22 NOTE — Telephone Encounter (Signed)
Patient wants a call back from Nurse Christy Miranda about  Refills when she gets the chance. She said she rather talk to [place] b/c she knows her. I let know that Ms Christy Miranda is in clinic today but I will forward this message to her. She verbally agreed and said thank you.    Micki Riley, CMA

## 2013-06-22 NOTE — Telephone Encounter (Signed)
Returned call to patient she stated she needed 90 day refills on toprol 25 mg and toprol 50 mg sent to mail order pharmacy.Stated she needed 90 day refill on xanax sent to mail order and also a 30 day refill [facility] to [facility] [address].Also needs refill on coumadin.Message sent to coumadin clinic for warfarin refill.

## 2013-06-23 ENCOUNTER — Other Ambulatory Visit: Payer: Self-pay

## 2013-06-23 ENCOUNTER — Telehealth: Payer: Self-pay | Admitting: *Deleted

## 2013-06-23 MED ORDER — WARFARIN SODIUM 5 MG PO TABS: ORAL_TABLET | ORAL | Status: DC

## 2013-06-23 NOTE — Telephone Encounter (Signed)
Message copied by Jeannine Kitten on Thu Jun 23, 2013  8:35 AM ------      Message from: Meda Klinefelter D      Created: Wed Jun 22, 2013  6:24 PM       Dr.Jordan's patient needs warfarin 5 mg 90 day refill sent to mail order pharmacy                  Thanks Elnita Maxwell ------

## 2013-06-24 ENCOUNTER — Ambulatory Visit (INDEPENDENT_AMBULATORY_CARE_PROVIDER_SITE_OTHER): Payer: Medicare Other | Admitting: *Deleted

## 2013-06-24 DIAGNOSIS — I4891 Unspecified atrial fibrillation: Secondary | ICD-10-CM | POA: Diagnosis not present

## 2013-06-24 LAB — POCT INR: INR: 2.5

## 2013-07-04 DIAGNOSIS — D239 Other benign neoplasm of skin, unspecified: Secondary | ICD-10-CM | POA: Diagnosis not present

## 2013-07-04 DIAGNOSIS — L408 Other psoriasis: Secondary | ICD-10-CM | POA: Diagnosis not present

## 2013-07-04 DIAGNOSIS — T148 Other injury of unspecified body region: Secondary | ICD-10-CM | POA: Diagnosis not present

## 2013-07-04 DIAGNOSIS — W57XXXA Bitten or stung by nonvenomous insect and other nonvenomous arthropods, initial encounter: Secondary | ICD-10-CM | POA: Diagnosis not present

## 2013-07-04 DIAGNOSIS — I8310 Varicose veins of unspecified lower extremity with inflammation: Secondary | ICD-10-CM | POA: Diagnosis not present

## 2013-07-04 DIAGNOSIS — Z85828 Personal history of other malignant neoplasm of skin: Secondary | ICD-10-CM | POA: Diagnosis not present

## 2013-07-06 ENCOUNTER — Telehealth: Payer: Self-pay | Admitting: Cardiology

## 2013-07-06 MED ORDER — ALPRAZOLAM 0.25 MG PO TABS: 0.2500 mg | ORAL_TABLET | Freq: Three times a day (TID) | ORAL | Status: DC | PRN

## 2013-07-06 NOTE — Telephone Encounter (Signed)
New problem   Pt need to speak with nurse concerning her mail order prescription for Alprazolam 0.25mg . Please call pt.

## 2013-07-06 NOTE — Telephone Encounter (Signed)
Returned call to patient she stated [facility] messed up her prescriptions.Stated she was almost out of alprazolam.Alprazolam # 30 tablets called into [facility] [address]

## 2013-07-22 ENCOUNTER — Ambulatory Visit (INDEPENDENT_AMBULATORY_CARE_PROVIDER_SITE_OTHER): Payer: Medicare Other | Admitting: *Deleted

## 2013-07-22 ENCOUNTER — Ambulatory Visit (INDEPENDENT_AMBULATORY_CARE_PROVIDER_SITE_OTHER): Payer: Medicare Other | Admitting: Neurology

## 2013-07-22 ENCOUNTER — Encounter: Payer: Self-pay | Admitting: Neurology

## 2013-07-22 VITALS — BP 159/82 | HR 88 | Ht <= 58 in | Wt 104.0 lb

## 2013-07-22 DIAGNOSIS — R42 Dizziness and giddiness: Secondary | ICD-10-CM | POA: Diagnosis not present

## 2013-07-22 DIAGNOSIS — I4891 Unspecified atrial fibrillation: Secondary | ICD-10-CM

## 2013-07-22 HISTORY — DX: Dizziness and giddiness: R42

## 2013-07-22 LAB — POCT INR: INR: 3.3

## 2013-07-22 MED ORDER — MECLIZINE HCL 12.5 MG PO TABS: 12.5000 mg | ORAL_TABLET | Freq: Three times a day (TID) | ORAL | Status: DC | PRN

## 2013-07-22 NOTE — Progress Notes (Signed)
Reason for visit: Dizziness  Christy Miranda is a 79 y.o. female  History of present illness:  Christy Miranda is a 79 year old white female with a history of dizziness that has been present off and on for about one year. The patient has episodes that may last 2 or 3 days at a time. The episodes are associated with a lightheaded sensation, and the patient feels as if she is in a "fog". The patient has a sensation that she is going to black out, but she has never lost consciousness. The patient has blurring of vision, no loss of vision. The patient may feel as if her heart rate is increased, and she has some sensation of anxiety. The patient will take Xanax for the episodes with some benefit. The patient does not believe that sitting or lying down will help the dizziness. The patient has no other associated symptoms of weakness or numbness of the extremities, although she does have occasional tingling in hands. The patient denies any slurred speech, or problems swallowing. The patient has some sensation of imbalance with the dizziness. The patient has a pacemaker in place. The patient has undergone evaluations through the emergency room that have included CT scan evaluation the brain that shows a moderate level small vessel disease. No acute changes have been seen. The last evaluation was in mid June of 2014. CT angiographic evaluation of the head and neck was unremarkable. The patient is sent to this office for an evaluation. Previously, orthostatic blood pressure checks have been unremarkable.  Past Medical History  Diagnosis Date  . HTN (hypertension)   . Permanent atrial fibrillation   . Bradycardia     s/p PPM  . Hypothyroidism   . Anxiety   . Diverticulitis     history of  . History of hysterectomy   . Long-term (current) use of anticoagulants   . Scoliosis (and kyphoscoliosis), idiopathic   . DVT (deep vein thrombosis) in pregnancy     right peroneal  . Dizziness and giddiness 07/22/2013     Past Surgical History  Procedure Laterality Date  . Pacemaker insertion  03/03/2006    most recent generator (MDT) change 10/30/06 by Dr Reyes Ivan  . Cardiac catheterization  01/08/1999    normal LV function  . Cardioversion  10/30/2006    Successful elective DC cardioversion  . Cardioversion  12/29/2002    Successful DC cardioversion  . Cardioversion  12/03/1999    Successful DC cardioversion  . Cholecystectomy      Family History  Problem Relation Age of Onset  . Coronary artery disease Mother   . Cancer - Other Father     Pancreatic Cancer    Social history:  reports that she has never smoked. She has never used smokeless tobacco. She reports that she does not drink alcohol or use illicit drugs.  Medications:  Current Outpatient Prescriptions on File Prior to Visit  Medication Sig Dispense Refill  . ALPRAZolam (XANAX) 0.25 MG tablet Take 1 tablet (0.25 mg total) by mouth 3 (three) times daily as needed. For anxiety  30 tablet  0  . Calcium Carbonate-Vitamin D (CALCIUM-VITAMIN D) 600-200 MG-UNIT CAPS Take 1 tablet by mouth daily.       [name] levothyroxine (SYNTHROID, LEVOTHROID) 50 MCG tablet Take 50 mcg by mouth daily.       . metoprolol succinate (TOPROL XL) 25 MG 24 hr tablet Take 1 tablet (25 mg total) by mouth 2 (two) times daily. Take with 50 mg to equal  75 mg dose twice daily  180 tablet  3  . metoprolol succinate (TOPROL-XL) 50 MG 24 hr tablet Take 1 tablet (50 mg total) by mouth 2 (two) times daily. Take with 25 mg dose to equal 75 mg twice daily  180 tablet  3  . Multiple Vitamin (MULTIVITAMIN WITH MINERALS) TABS Take 1 tablet by mouth daily.      [name] warfarin (COUMADIN) 5 MG tablet Take as directed by coumadin clinic  150 tablet  1   No current facility-administered medications on file prior to visit.      Allergies  Allergen Reactions  . Betapace [Sotalol Hcl]     unknown    ROS:  Out of a complete 14 system review of symptoms, the patient complains only of  the following symptoms, and all other reviewed systems are negative.  Weight loss, fatigue Swelling in the legs Blurred vision Diarrhea Easy bruising, easy bleeding Feeling cold Runny nose Confusion, numbness, weakness, dizziness, tremor Depression, anxiety, decreased energy, change in appetite Insomnia  Blood pressure 159/82, pulse 88, height 4\' 4"  (1.321 m), weight 104 lb (47.174 kg).  Physical Exam  General: The patient is alert and cooperative at the time of the examination.  Head: Pupils are equal, round, and reactive to light. Discs are flat bilaterally. Tympanic membranes are clear bilaterally.  Neck: The neck is supple, no carotid bruits are noted.  Respiratory: The respiratory examination is clear.  Cardiovascular: The cardiovascular examination reveals a regular rate and rhythm, no obvious murmurs or rubs are noted.  Neuromuscular: The patient has severe kyphosis of the upper thoracic and cervical spine.  Skin: Extremities are without significant edema.  Neurologic Exam  Mental status:  Cranial nerves: Facial symmetry is present. There is good sensation of the face to pinprick and soft touch bilaterally. The strength of the facial muscles and the muscles to head turning and shoulder shrug are normal bilaterally. Speech is well enunciated, no aphasia or dysarthria is noted. Extraocular movements are full. Visual fields are full.  Motor: The motor testing reveals 5 over 5 strength of all 4 extremities. Good symmetric motor tone is noted throughout.  Sensory: Sensory testing is intact to pinprick, soft touch, vibration sensation, and position sense on all 4 extremities. No evidence of extinction is noted.  Coordination: Cerebellar testing reveals good finger-nose-finger and heel-to-shin bilaterally.  Gait and station: Gait is normal. Tandem gait is slightly unsteady. Romberg is negative. No drift is seen.  Reflexes: Deep tendon reflexes are symmetric and normal  bilaterally. Toes are downgoing bilaterally.   Assessment/Plan:  1. Episodic dizziness  2. Anxiety disorder  3. Atrial fibrillation  The patient has episodes of dizziness, sensation of near syncope, blurred vision. The patient has had a cerebrovascular workup that has been relatively unremarkable. The etiology of episodes is not clear, but the patient will be given a trial on meclizine to see if this improves her symptoms. This could confirm that the episodes are related to inner ear disease. The patient has had episodes off and on for one year. The patient may go several months between episodes. Interrogation of her pacemaker may be helpful to determine if cardiac rhythm abnormalities are occurring around these times. The patient followup in about 6 months, a prescription was given for meclizine.  Marlan Palau MD 07/23/2013 8:21 AM  [facility] [address] [place], [place] [address]  Phone [phone] Fax [phone]

## 2013-07-28 DIAGNOSIS — R109 Unspecified abdominal pain: Secondary | ICD-10-CM | POA: Diagnosis not present

## 2013-08-04 ENCOUNTER — Ambulatory Visit (INDEPENDENT_AMBULATORY_CARE_PROVIDER_SITE_OTHER): Payer: Medicare Other | Admitting: *Deleted

## 2013-08-04 DIAGNOSIS — I4891 Unspecified atrial fibrillation: Secondary | ICD-10-CM | POA: Diagnosis not present

## 2013-08-04 LAB — POCT INR: INR: 2.5

## 2013-08-05 ENCOUNTER — Telehealth: Payer: Self-pay | Admitting: Cardiology

## 2013-08-05 NOTE — Telephone Encounter (Signed)
Returned call to patient she stated she went out with daughter yesterday and was up more.Stated her lower legs and feet are swollen more today.Stated she is wearing knee hi compression stockings.Stated she wanted to know if she could take  lasix 20 mg.Advised to continue to wear compression stockings and keep legs elevated as much as she can.Advised ok to take lasix 20 mg if needed.

## 2013-08-05 NOTE — Telephone Encounter (Signed)
New Problem  Pt states her legs are swelling really bad//

## 2013-08-17 ENCOUNTER — Telehealth: Payer: Self-pay | Admitting: Cardiology

## 2013-08-17 NOTE — Telephone Encounter (Signed)
New problem  Christy Miranda,Christy Miranda would like for you to call her re: traveling.

## 2013-08-18 NOTE — Telephone Encounter (Signed)
Returned call to patient she stated she was going to [employer].Stated she was worried about the 4 hour trip with her swollen legs.Stated she wanted to make sure with Dr.Jordan it was ok to go. Advised to stop frequently, get out of car and walk around.Advised to wear support stockings.Message sent to Dr.Jordan.

## 2013-08-18 NOTE — Telephone Encounter (Signed)
Agree with your advice.  Peter Sw[place] MD, [facility]

## 2013-08-25 ENCOUNTER — Ambulatory Visit (INDEPENDENT_AMBULATORY_CARE_PROVIDER_SITE_OTHER): Payer: Medicare Other | Admitting: *Deleted

## 2013-08-25 ENCOUNTER — Encounter: Payer: Self-pay | Admitting: Cardiology

## 2013-08-25 ENCOUNTER — Ambulatory Visit (INDEPENDENT_AMBULATORY_CARE_PROVIDER_SITE_OTHER): Payer: Medicare Other | Admitting: Cardiology

## 2013-08-25 VITALS — BP 155/79 | HR 82 | Ht <= 58 in | Wt 105.8 lb

## 2013-08-25 DIAGNOSIS — I4891 Unspecified atrial fibrillation: Secondary | ICD-10-CM | POA: Diagnosis not present

## 2013-08-25 DIAGNOSIS — I10 Essential (primary) hypertension: Secondary | ICD-10-CM | POA: Diagnosis not present

## 2013-08-25 DIAGNOSIS — Z7901 Long term (current) use of anticoagulants: Secondary | ICD-10-CM | POA: Diagnosis not present

## 2013-08-25 LAB — PACEMAKER DEVICE OBSERVATION
BRDY-0004RV: 110 {beats}/min
RV LEAD THRESHOLD: 1 V

## 2013-08-25 LAB — POCT INR: INR: 3.3

## 2013-08-25 NOTE — Progress Notes (Signed)
Christy Miranda Date of Birth: 1921/09/03   History of Present Illness: Christy Miranda and is seen today for followup. She history of atrial fibrillation with sick sinus syndrome. She is status post pacemaker implant with revision of her generator in January 2014. Following this procedure she developed a DVT. She is on chronic Coumadin. Overall she is doing very well from a cardiac standpoint. She denies any significant palpitations. This past weekend while at the beach she tripped and fell coming out of a restaurant and hit her head against the door. She did not seek further evaluation. She has had no significant nausea, headaches, or visual changes. She was seen by neurology earlier in the summer because of some dizziness and feeling "foggy". Her evaluation at that time was unremarkable.  Current Outpatient Prescriptions on File Prior to Visit  Medication Sig Dispense Refill  . ALPRAZolam (XANAX) 0.25 MG tablet Take 1 tablet (0.25 mg total) by mouth 3 (three) times daily as needed. For anxiety  30 tablet  0  . Calcium Carbonate-Vitamin D (CALCIUM-VITAMIN D) 600-200 MG-UNIT CAPS Take 1 tablet by mouth daily.       [name] levothyroxine (SYNTHROID, LEVOTHROID) 50 MCG tablet Take 50 mcg by mouth daily.       . meclizine (ANTIVERT) 12.5 MG tablet Take 1 tablet (12.5 mg total) by mouth 3 (three) times daily as needed.  30 tablet  1  . metoprolol succinate (TOPROL XL) 25 MG 24 hr tablet Take 1 tablet (25 mg total) by mouth 2 (two) times daily. Take with 50 mg to equal 75 mg dose twice daily  180 tablet  3  . Multiple Vitamin (MULTIVITAMIN WITH MINERALS) TABS Take 1 tablet by mouth daily.      [name] warfarin (COUMADIN) 5 MG tablet Take as directed by coumadin clinic  150 tablet  1   No current facility-administered medications on file prior to visit.    Allergies  Allergen Reactions  . Betapace [Sotalol Hcl]     unknown    Past Medical History  Diagnosis Date  . HTN (hypertension)   . Permanent atrial  fibrillation   . Bradycardia     s/p PPM  . Hypothyroidism   . Anxiety   . Diverticulitis     history of  . History of hysterectomy   . Long-term (current) use of anticoagulants   . Scoliosis (and kyphoscoliosis), idiopathic   . DVT (deep vein thrombosis) in pregnancy     right peroneal  . Dizziness and giddiness 07/22/2013    Past Surgical History  Procedure Laterality Date  . Pacemaker insertion  03/03/2006    most recent generator (MDT) change 10/30/06 by Dr Reyes Ivan  . Cardiac catheterization  01/08/1999    normal LV function  . Cardioversion  10/30/2006    Successful elective DC cardioversion  . Cardioversion  12/29/2002    Successful DC cardioversion  . Cardioversion  12/03/1999    Successful DC cardioversion  . Cholecystectomy      History  Smoking status  . Never Smoker   Smokeless tobacco  . Never Used    History  Alcohol Use No    Family History  Problem Relation Age of Onset  . Coronary artery disease Mother   . Cancer - Other Father     Pancreatic Cancer    Review of Systems: As noted in history of present illness. Pacer site has healed well. All other systems were reviewed and are negative.  Physical Exam: BP 155/79  Pulse 82  Ht 4\' 4"  (1.321 m)  Wt 47.991 kg (105 lb 12.8 oz)  BMI 27.5 kg/m2 She is a pleasant elderly white female in no acute distress. HEENT is normal. Neck is supple without JVD, adenopathy, thyromegaly, or bruits. Lungs are clear. Cardiac exam reveals a irregular rate and rhythm without gallop, murmur, or click. Her pacemaker site has healed completely. Abdomen is soft and nontender. Spine reveals severe kyphoscoliosis. She has trace right calf edema. Pulses are 2+.   Skin is warm and dry. She is alert and oriented x3. Cranial nerves II through XII are intact.   LABORATORY DATA: INR 3.4   Assessment / Plan: 1. Tachybradycardia syndrome. Heart rate is well controlled on metoprolol. She is on chronic anticoagulation with  Coumadin.  2. Hypertension, controlled.  3. Right calf DVT. Resolved.

## 2013-08-25 NOTE — Progress Notes (Signed)
Pacemaker check in clinic. Normal device function. Thresholds, sensing, impedances consistent with previous measurements. Device programmed to maximize longevity. 2 high ventricular rates noted, 3-5 seconds. Device programmed at appropriate safety margins. Histogram distribution appropriate for patient activity level. Device programmed to optimize intrinsic conduction. Estimated longevity 10 years.  Patient education completed.  ROV in December with Dr. Johney Frame.

## 2013-08-25 NOTE — Patient Instructions (Signed)
Continue your current therapy  I will see you in 6 months.   

## 2013-09-03 ENCOUNTER — Encounter: Payer: Self-pay | Admitting: Internal Medicine

## 2013-09-09 ENCOUNTER — Ambulatory Visit (INDEPENDENT_AMBULATORY_CARE_PROVIDER_SITE_OTHER): Payer: Medicare Other | Admitting: Pharmacist

## 2013-09-09 DIAGNOSIS — L408 Other psoriasis: Secondary | ICD-10-CM | POA: Diagnosis not present

## 2013-09-09 DIAGNOSIS — L988 Other specified disorders of the skin and subcutaneous tissue: Secondary | ICD-10-CM | POA: Diagnosis not present

## 2013-09-09 DIAGNOSIS — I4891 Unspecified atrial fibrillation: Secondary | ICD-10-CM | POA: Diagnosis not present

## 2013-09-09 DIAGNOSIS — D485 Neoplasm of uncertain behavior of skin: Secondary | ICD-10-CM | POA: Diagnosis not present

## 2013-09-09 DIAGNOSIS — Z85828 Personal history of other malignant neoplasm of skin: Secondary | ICD-10-CM | POA: Diagnosis not present

## 2013-09-09 DIAGNOSIS — I8310 Varicose veins of unspecified lower extremity with inflammation: Secondary | ICD-10-CM | POA: Diagnosis not present

## 2013-09-09 LAB — POCT INR: INR: 2.4

## 2013-09-13 ENCOUNTER — Telehealth: Payer: Self-pay | Admitting: Cardiology

## 2013-09-13 NOTE — Telephone Encounter (Signed)
New message    Trouble with legs swelling

## 2013-09-14 NOTE — Telephone Encounter (Signed)
Returned call to patient 09/13/13 she stated she is having swelling in lower legs.Stated she just took a lasix 20 mg.Stated she has a sore on lf ankle and unable to wear compression stockings all day too painful.Stated she is going to buy new compression stockings with less compression.Stated she following low salt diet.

## 2013-09-16 DIAGNOSIS — Z23 Encounter for immunization: Secondary | ICD-10-CM | POA: Diagnosis not present

## 2013-09-16 NOTE — Telephone Encounter (Signed)
Spoke to patient she stated she took lasix 20 mg daily for 2 days and swelling in lower legs better.She wanted to know how many glasses of water to drink daily.Advised she does not need to drink 8 glasses of water daily, ok to drink two to three 6 oz glasses daily.

## 2013-09-19 ENCOUNTER — Telehealth: Payer: Self-pay | Admitting: Cardiology

## 2013-09-19 NOTE — Telephone Encounter (Signed)
New Problem  Pt states that she had a high dose flu shot from [facility] and she believes that she has had a reaction//a red patch on her arm// She states that the PA there said that it was probably inflammation and that it should begin to fade away// she says that it has faded however she is concerned about an infection// request a call back to discuss.   Pt also cannot remember if she had taken her coumadin and is not sure how to restart her dosage// please advise.

## 2013-09-19 NOTE — Telephone Encounter (Signed)
I spoke with the pt and she received the high dose flu shot from [facility] on Friday. On Saturday the pt noticed that her left arm at injection site was red. The pt contacted the pharmacist at [facility] and they told her that she may be having a reaction to an ingredient in the flu vaccine. [facility] and they also felt like it was a reaction to flu shot and recommended observation.  The pt called her PCP today but at this time has not received a return call.  Today the pt states that redness is fading. She denies warmth at site and does not have red lines on arm.  At this time I also recommended continued observation and advised that she contact her PCP tomorrow and make them aware.  I made her aware that if redness continues, site becomes feverish or red lines develop on arm then she should be seen by PCP.  Pt agreed with plan.  The pt also said that in all the commotion this weekend she cannot remember if she took her coumadin yesterday.  I advised the pt to resume her normal schedule today and do not double up on Coumadin since she is not 100% sure whether she did or did not take this medication. The pt is scheduled for INR on 09/30/13.

## 2013-09-22 ENCOUNTER — Telehealth: Payer: Self-pay | Admitting: Cardiology

## 2013-09-22 DIAGNOSIS — I10 Essential (primary) hypertension: Secondary | ICD-10-CM

## 2013-09-22 NOTE — Telephone Encounter (Signed)
Returned call to patient she stated she continues to have swelling in both lower legs and they have been worse this week.Stated the back of her calves feel like a hard lump.Stated she has not taken any lasix this week.Stated she wanted to know if she could take lasix 20 mg every other day.Also wants to know if she needs her kidney functions checked.Stated she has appointment for a INR 09/30/13 and she could have done then.Also wanted to let Dr.Jordan know she had high dose flu vaccine 09/16/13 at [facility] and she had a reaction.Stated arm right below shot became red and painful.Stated today the redness is better and no pain.Message sent to Dr.Jordan for advice.

## 2013-09-22 NOTE — Telephone Encounter (Signed)
New Problem:  Pt states she has multiple concerns and wants a call back from [place] asap. Please advise

## 2013-09-22 NOTE — Telephone Encounter (Signed)
I think its fine for her to take lasix 20 mg every other day. We can check a BMET when she comes in for her INR.  Jaece Ducharme [place] MD, [facility]

## 2013-09-23 MED ORDER — FUROSEMIDE 20 MG PO TABS: ORAL_TABLET | ORAL | Status: DC

## 2013-09-23 NOTE — Addendum Note (Signed)
Addended by: Meda Klinefelter D on: 09/23/2013 09:13 AM   Modules accepted: Orders, Medications

## 2013-09-23 NOTE — Telephone Encounter (Signed)
Returned call to patient Dr.Jordan advised may take lasix 20 mg every other day.Bmet scheduled 09/30/13.Advised to call back if not better.

## 2013-09-30 ENCOUNTER — Other Ambulatory Visit (INDEPENDENT_AMBULATORY_CARE_PROVIDER_SITE_OTHER): Payer: Medicare Other

## 2013-09-30 ENCOUNTER — Ambulatory Visit (INDEPENDENT_AMBULATORY_CARE_PROVIDER_SITE_OTHER): Payer: Medicare Other | Admitting: General Practice

## 2013-09-30 DIAGNOSIS — I4891 Unspecified atrial fibrillation: Secondary | ICD-10-CM

## 2013-09-30 DIAGNOSIS — I10 Essential (primary) hypertension: Secondary | ICD-10-CM

## 2013-09-30 LAB — BASIC METABOLIC PANEL
CO2: 31 mEq/L (ref 19–32)
Calcium: 9.2 mg/dL (ref 8.4–10.5)
Chloride: 96 mEq/L (ref 96–112)
Sodium: 134 mEq/L — ABNORMAL LOW (ref 135–145)

## 2013-09-30 LAB — POCT INR: INR: 3.3

## 2013-10-06 ENCOUNTER — Telehealth: Payer: Self-pay | Admitting: Cardiology

## 2013-10-06 NOTE — Telephone Encounter (Signed)
Pt concerned because she has had several days of diarrhea.  She spoke with Tiffany yesterday who had her take 5mg  of Coumadin rather than 7.5mg .  She states the diarrhea has improved and she has increased her green intake.  Informed pt this is exactly what we would want her to do and there are no changes necessary at this time.  If she continues to have diarrhea over the weekend, she will call us back but otherwise will plan on keeping her original follow up appt.

## 2013-10-06 NOTE — Telephone Encounter (Signed)
New message    Pt said she is supposed to call Tiffany today

## 2013-10-11 ENCOUNTER — Telehealth: Payer: Self-pay | Admitting: Cardiology

## 2013-10-11 NOTE — Telephone Encounter (Signed)
New problem:  Pt states she has some questions for [facility]. Pt states she will give more details when the nurse calls. Please advise

## 2013-10-12 NOTE — Telephone Encounter (Signed)
Returned call to patient she stated she wanted appointment with Dr.Jordan before 01/2014.Stated she was doing ok,just don't want to wait that long.Appointment scheduled with Dr.Jordan 12/19/13 at 4:15 pm.

## 2013-10-14 ENCOUNTER — Telehealth: Payer: Self-pay | Admitting: Cardiology

## 2013-10-14 NOTE — Telephone Encounter (Signed)
New Problem:  Pt is requesting to speak to a coumadin nurse. Pt states she is having stomach problems. Tried to transfer pt but got no answer... Please call pt back.

## 2013-10-14 NOTE — Telephone Encounter (Signed)
Telephoned pt back she states that she has 2 loose stool today, she will monitor for an increase in stools, weakness and dehydration and if symptoms worsen she will report to ER. Pt will call us back on Monday with update. She was instructed to continue her current coumadin dose.  She has tried to get an appt with her GI doctor, but no success. Encouraged pt to call GI doctor first thing on Monday to report these frequent episode of diarrhea. Pt verbalizes understanding.

## 2013-10-17 ENCOUNTER — Telehealth: Payer: Self-pay | Admitting: Cardiology

## 2013-10-17 NOTE — Telephone Encounter (Signed)
Follow Up   Pt calling// requesting that Tiffany in the coumadin clinic gives her a call back ( No further details)

## 2013-10-17 NOTE — Telephone Encounter (Signed)
Spoke with pt.  She wanted to move her appt to earlier this week.  Appt moved.

## 2013-10-18 ENCOUNTER — Ambulatory Visit (INDEPENDENT_AMBULATORY_CARE_PROVIDER_SITE_OTHER): Payer: Medicare Other | Admitting: General Practice

## 2013-10-18 DIAGNOSIS — I4891 Unspecified atrial fibrillation: Secondary | ICD-10-CM | POA: Diagnosis not present

## 2013-10-18 LAB — POCT INR: INR: 2.8

## 2013-10-20 ENCOUNTER — Telehealth: Payer: Self-pay | Admitting: Cardiology

## 2013-10-20 NOTE — Telephone Encounter (Signed)
New Problem:  Pt states she is wanting to discuss with Christy Miranda about her acid reflux. Please advise

## 2013-10-20 NOTE — Telephone Encounter (Signed)
Returned call to patient she stated she has been having chest pain off and on for the past 2 days.Stated pain is like acid reflux she has had before before.Stated she would like to be seen she is worried it might be her heart.Advised to take Maalox if needed.Appointment scheduled with Norma Fredrickson NP 10/21/13.Advised to go to ER if needed.

## 2013-10-21 ENCOUNTER — Ambulatory Visit (INDEPENDENT_AMBULATORY_CARE_PROVIDER_SITE_OTHER): Payer: Medicare Other | Admitting: Nurse Practitioner

## 2013-10-21 ENCOUNTER — Encounter: Payer: Self-pay | Admitting: Nurse Practitioner

## 2013-10-21 VITALS — BP 140/72 | HR 86 | Ht <= 58 in | Wt 105.8 lb

## 2013-10-21 DIAGNOSIS — R079 Chest pain, unspecified: Secondary | ICD-10-CM | POA: Diagnosis not present

## 2013-10-21 NOTE — Patient Instructions (Addendum)
Stay on your current medicines  Try the Maalox for the next spell of chest pain - if this does not work then go to the drug store and pick up some OTC prilosec or Nexium and take one a day for 2 weeks. If your symptoms continue then get back in touch with [place].   See Dr. [place] back as planned.  Call the [facility] office at [phone] if you have any questions, problems or concerns.

## 2013-10-21 NOTE — Progress Notes (Signed)
Ivonne Andrew Date of Birth: [birth year] Medical Record #[id number]  History of Present Illness: Ms. Blades is seen back today for a work in visit. Seen for Dr. Swaziland. She is 79 years of age. She has multiple issues which are as outlined below. These include chronic atrial fib with tachy brady syndrome, chronic coumadin therapy and edema. She has a pacemaker in place and has had prior generator change out. Her coumadin was held and she did develop a right popliteal DVT and required Lovenox along with her coumadin.   She was last here in September - seemed to be doing well from our standpoint. She had seen neurology for some dizziness.   Comes back today. Here alone. Called yesterday with chest pain off and on. She has had no known CAD. Said her pain was like acid reflux. She notes that this started Wednesday evening - feels dull - worse with moving/bending over. Lasted about 10 minutes. Recurred a few times yesterday. Nothing else she really do to bring it on. Not short of breath. No cough. No fever or chills. No falls. Was told to try some Maalox but she has not had a recurrence so she has not tried. She feels that due to her spine that everything in her abdominal cavity is "getting pushed up".   Current Outpatient Prescriptions  Medication Sig Dispense Refill  . ALPRAZolam (XANAX) 0.25 MG tablet Take 1 tablet (0.25 mg total) by mouth 3 (three) times daily as needed. For anxiety  30 tablet  0  . Calcium Carbonate-Vitamin D (CALCIUM-VITAMIN D) 600-200 MG-UNIT CAPS Take 1 tablet by mouth daily.       . furosemide (LASIX) 20 MG tablet Take 20 mg every other day  30 tablet  6  . levothyroxine (SYNTHROID, LEVOTHROID) 50 MCG tablet Take 50 mcg by mouth daily.       . meclizine (ANTIVERT) 12.5 MG tablet Take 1 tablet (12.5 mg total) by mouth 3 (three) times daily as needed.  30 tablet  1  . metoprolol succinate (TOPROL-XL) 25 MG 24 hr tablet Take 75 mg by mouth 2 (two) times daily. Take with 50 mg to  equal 75 mg dose twice daily      . Multiple Vitamin (MULTIVITAMIN WITH MINERALS) TABS Take 1 tablet by mouth daily.      [name] warfarin (COUMADIN) 5 MG tablet Take as directed by coumadin clinic  150 tablet  1   No current facility-administered medications for this visit.    Allergies  Allergen Reactions  . Betapace [Sotalol Hcl]     unknown    Past Medical History  Diagnosis Date  . HTN (hypertension)   . Permanent atrial fibrillation   . Bradycardia     s/p PPM  . Hypothyroidism   . Anxiety   . Diverticulitis     history of  . History of hysterectomy   . Long-term (current) use of anticoagulants   . Scoliosis (and kyphoscoliosis), idiopathic   . DVT (deep vein thrombosis) in pregnancy     right peroneal  . Dizziness and giddiness 07/22/2013    Past Surgical History  Procedure Laterality Date  . Pacemaker insertion  03/03/2006    most recent generator (MDT) change 10/30/06 by Dr Reyes Ivan  . Cardiac catheterization  01/08/1999    normal LV function  . Cardioversion  10/30/2006    Successful elective DC cardioversion  . Cardioversion  12/29/2002    Successful DC cardioversion  . Cardioversion  12/03/1999  Successful DC cardioversion  . Cholecystectomy      History  Smoking status  . Never Smoker   Smokeless tobacco  . Never Used    History  Alcohol Use No    Family History  Problem Relation Age of Onset  . Coronary artery disease Mother   . Cancer - Other Father     Pancreatic Cancer    Review of Systems: The review of systems is per the HPI.  All other systems were reviewed and are negative.  Physical Exam: BP 140/72  Pulse 86  Ht 4\' 8"  (1.422 m)  Wt 105 lb 12.8 oz (47.991 kg)  BMI 23.73 kg/m2 Patient is very pleasant and in no acute distress. She is quite kyphotic - her chin almost touches her chest. Skin is warm and dry. Color is normal.  HEENT is unremarkable. Normocephalic/atraumatic. PERRL. Sclera are nonicteric. Neck is supple. No masses. No  JVD. Lungs are clear. Cardiac exam shows an irregular rhythm. Rate is ok.  Abdomen is soft. Extremities are without edema. Gait and ROM are intact. No gross neurologic deficits noted.  LABORATORY DATA: EKG today shows atrial fib with underlying V pacing. Nonspecific changes noted.     Chemistry      Component Value Date/Time   NA 134* 09/30/2013 1545   K 4.4 09/30/2013 1545   CL 96 09/30/2013 1545   CO2 31 09/30/2013 1545   BUN 17 09/30/2013 1545   CREATININE 0.5 09/30/2013 1545      Component Value Date/Time   CALCIUM 9.2 09/30/2013 1545   ALKPHOS 55 01/01/2013 1828   AST 36 01/01/2013 1828   ALT 18 01/01/2013 1828   BILITOT 0.3 01/01/2013 1828     Lab Results  Component Value Date   WBC 3.4* 05/16/2013   HGB 12.4 05/16/2013   HCT 36.5 05/16/2013   MCV 90.8 05/16/2013   PLT 149* 05/16/2013   Lab Results  Component Value Date   CHOL  Value: 155        ATP III CLASSIFICATION:  <200     mg/dL   Desirable  161-096  mg/dL   Borderline High  >=045    mg/dL   High        40/98/1191   HDL 62 11/29/2009   LDLCALC  Value: 86        Total Cholesterol/HDL:CHD Risk Coronary Heart Disease Risk Table                     Men   Women  1/2 Average Risk   3.4   3.3  Average Risk       5.0   4.4  2 X Average Risk   9.6   7.1  3 X Average Risk  23.4   11.0        Use the calculated Patient Ratio above and the CHD Risk Table to determine the patient's CHD Risk.        ATP III CLASSIFICATION (LDL):  <100     mg/dL   Optimal  478-295  mg/dL   Near or Above                    Optimal  130-159  mg/dL   Borderline  621-308  mg/dL   High  >657     mg/dL   Very High 84/69/6295   TRIG 35 11/29/2009   CHOLHDL 2.5 11/29/2009    Assessment / Plan: 1. Chest pain - seems  atypical - will let her try the Maalox - if that does not help, will try some OTC Prilosec/nexium for 2 weeks. If symptoms continue, will need to reassess.   2. Atrial fib - on coumadin - no falls. Rate is controlled.  3. Underlying PPM for SSS - sees  Dr. Johney Frame next month.  4. Advanced age  She sees Dr. Swaziland in January.   Patient is agreeable to this plan and will call if any problems develop in the interim.   Rosalio Macadamia, RN, ANP-C [facility] [address] [place], [place]  [address]

## 2013-11-01 ENCOUNTER — Telehealth: Payer: Self-pay | Admitting: Cardiology

## 2013-11-01 MED ORDER — ALPRAZOLAM 0.25 MG PO TABS: 0.2500 mg | ORAL_TABLET | Freq: Three times a day (TID) | ORAL | Status: DC | PRN

## 2013-11-01 NOTE — Telephone Encounter (Signed)
New problem    Pt need a refill on ALPRAZOLAM and pt need to speak to RN as well.

## 2013-11-01 NOTE — Telephone Encounter (Signed)
Returned call to patient she stated her mail pharmacy is late on sending her alprazolam.30 day supply of alprazolam called into [facility].

## 2013-11-11 ENCOUNTER — Telehealth: Payer: Self-pay | Admitting: Cardiology

## 2013-11-11 NOTE — Telephone Encounter (Signed)
Returned call to patient she stated she has a bad cold.Stated she has a stuffy nose,couhging thick clear phlegm.Advised she can take plain mucinex as directed.Advised to call PCP.Stated she has a INR appointment today.Advised to stay in and I will send a message to coumadin clinic to reschedule INR to 11/14/13 since she has a appointment that day with Dr.Allred.

## 2013-11-11 NOTE — Telephone Encounter (Signed)
New messages  Patient has a bad cold/cough and she would like your advise on what she should take? Please call and advise.

## 2013-11-12 DIAGNOSIS — I4891 Unspecified atrial fibrillation: Secondary | ICD-10-CM | POA: Diagnosis not present

## 2013-11-12 DIAGNOSIS — J069 Acute upper respiratory infection, unspecified: Secondary | ICD-10-CM | POA: Diagnosis not present

## 2013-11-12 DIAGNOSIS — R05 Cough: Secondary | ICD-10-CM | POA: Diagnosis not present

## 2013-11-14 ENCOUNTER — Ambulatory Visit (INDEPENDENT_AMBULATORY_CARE_PROVIDER_SITE_OTHER): Payer: Medicare Other | Admitting: General Practice

## 2013-11-14 ENCOUNTER — Encounter: Payer: Self-pay | Admitting: Internal Medicine

## 2013-11-14 ENCOUNTER — Ambulatory Visit (INDEPENDENT_AMBULATORY_CARE_PROVIDER_SITE_OTHER): Payer: Medicare Other | Admitting: Internal Medicine

## 2013-11-14 VITALS — BP 126/82 | HR 89 | Ht <= 58 in | Wt 104.4 lb

## 2013-11-14 DIAGNOSIS — I4891 Unspecified atrial fibrillation: Secondary | ICD-10-CM

## 2013-11-14 DIAGNOSIS — Z950 Presence of cardiac pacemaker: Secondary | ICD-10-CM | POA: Diagnosis not present

## 2013-11-14 DIAGNOSIS — Z7901 Long term (current) use of anticoagulants: Secondary | ICD-10-CM

## 2013-11-14 DIAGNOSIS — I495 Sick sinus syndrome: Secondary | ICD-10-CM

## 2013-11-14 DIAGNOSIS — I10 Essential (primary) hypertension: Secondary | ICD-10-CM

## 2013-11-14 LAB — POCT INR: INR: 3.3

## 2013-11-14 LAB — MDC_IDC_ENUM_SESS_TYPE_INCLINIC
Battery Remaining Longevity: 115 mo
Brady Statistic RV Percent Paced: 30 %
Lead Channel Impedance Value: 442 Ohm
Lead Channel Pacing Threshold Amplitude: 1 V
Lead Channel Sensing Intrinsic Amplitude: 4 mV
Lead Channel Setting Pacing Amplitude: 2.5 V

## 2013-11-14 NOTE — Patient Instructions (Addendum)
Follow up with the device clinic on 05-15-2014 @4 :30 pm.  Your physician wants you to follow-up in: December 2015 with Rick Duff, [facility]. You will receive a reminder letter in the mail two months in advance. If you don't receive a letter, please call our office to schedule the follow-up appointment.  Your physician recommends that you continue on your current medications as directed. Please refer to the Current Medication list given to you today.

## 2013-11-14 NOTE — Progress Notes (Signed)
PCP:  Emeterio Reeve, MD Primary Cardiologist:  Dr Swaziland  The patient presents today for electrophysiology followup.  Her SOB is stable.  She has cough for several days for which she has been evaluated by medicine. Today, she denies symptoms of palpitations, chest pain,  orthopnea, PND, dizziness, presyncope, syncope, or neurologic sequela.  The patient feels that she is tolerating medications without difficulties and is otherwise without complaint today.   Past Medical History  Diagnosis Date  . HTN (hypertension)   . Permanent atrial fibrillation   . Bradycardia     s/p PPM  . Hypothyroidism   . Anxiety   . Diverticulitis     history of  . History of hysterectomy   . Long-term (current) use of anticoagulants   . Scoliosis (and kyphoscoliosis), idiopathic   . DVT (deep vein thrombosis) in pregnancy     right peroneal  . Dizziness and giddiness 07/22/2013   Past Surgical History  Procedure Laterality Date  . Pacemaker insertion  03/03/2006    most recent generator (MDT) change 10/30/06 by Dr Reyes Ivan  . Cardiac catheterization  01/08/1999    normal LV function  . Cardioversion  10/30/2006    Successful elective DC cardioversion  . Cardioversion  12/29/2002    Successful DC cardioversion  . Cardioversion  12/03/1999    Successful DC cardioversion  . Cholecystectomy      Current Outpatient Prescriptions  Medication Sig Dispense Refill  . ALPRAZolam (XANAX) 0.25 MG tablet Take 1 tablet (0.25 mg total) by mouth 3 (three) times daily as needed. For anxiety  30 tablet  3  . Calcium Carbonate-Vitamin D (CALCIUM-VITAMIN D) 600-200 MG-UNIT CAPS Take 1 tablet by mouth daily.       . furosemide (LASIX) 20 MG tablet Take 20 mg every other day  30 tablet  6  . levothyroxine (SYNTHROID, LEVOTHROID) 50 MCG tablet Take 50 mcg by mouth daily.       . meclizine (ANTIVERT) 12.5 MG tablet Take 1 tablet (12.5 mg total) by mouth 3 (three) times daily as needed.  30 tablet  1  . metoprolol  succinate (TOPROL-XL) 25 MG 24 hr tablet Take 75 mg by mouth 2 (two) times daily. Take with 50 mg to equal 75 mg dose twice daily      . Multiple Vitamin (MULTIVITAMIN WITH MINERALS) TABS Take 1 tablet by mouth daily.      [name] warfarin (COUMADIN) 5 MG tablet Take as directed by coumadin clinic  150 tablet  1   No current facility-administered medications for this visit.    No Active Allergies  History   Social History  . Marital Status: Widowed    Spouse Name: N/A    Number of Children: 1  . Years of Education: 12   Occupational History  . Retired    Social History Main Topics  . Smoking status: Never Smoker   . Smokeless tobacco: Never Used  . Alcohol Use: No  . Drug Use: No  . Sexual Activity: No   Other Topics Concern  . Not on file   Social History Narrative   Lives alone in [place].    Family History  Problem Relation Age of Onset  . Coronary artery disease Mother   . Cancer - Other Father     Pancreatic Cancer   Physical Exam: Filed Vitals:   11/14/13 1632  BP: 126/82  Pulse: 89  Height: 4\' 8"  (1.422 m)  Weight: 104 lb 6.4 oz (47.356 kg)  GEN- The patient is elderly but pleasant appearing, alert and oriented x 3 today.   Head- normocephalic, atraumatic Eyes-  Sclera clear, conjunctiva pink Ears- hearing intact Oropharynx- clear Neck- supple, no JVP Lungs- Clear to ausculation bilaterally, normal work of breathing Chest- pacemaker pocket is well healed Heart- Regular rate and rhythm (paced) GI- soft, NT, ND, + BS Extremities- no clubbing, cyanosis, + edema  Pacemaker interrogation- reviewed in detail today,  See PACEART report  Assessment and Plan:  1, Symptomatic radycardia Normal pacemaker function See Pace Art report No changes today  2. Permanent afib Continue anticoagulation and rate control long term  3. HTN Stable No change required today  4. Cough Likely due to viral URI. She will follow-up with primary care if not  improved  Return to the device clinic in 6 months She will see Rick Duff in 1 year

## 2013-11-16 ENCOUNTER — Telehealth: Payer: Self-pay | Admitting: Cardiology

## 2013-11-16 ENCOUNTER — Telehealth: Payer: Self-pay | Admitting: Pharmacist

## 2013-11-16 NOTE — Telephone Encounter (Signed)
Patient called to inform me she was starting a Z-pak tonight.  She has a persistent cough that has prevented her from eating as well as she normally dose.  She is currently on warfarin 7.5 mg qd except 5 mg Monday and Friday.    Patient notified to only take warfarin 5 mg today and tomorrow (Wednesday and Thursday) given not eating as much, then resume 7.5 mg except 5 mg Monday and Friday and recheck INR in 6 days.  Appointment made.

## 2013-11-16 NOTE — Telephone Encounter (Signed)
New Message  Pt called -- requests a call back to determine if it is OK to take a Z pac// Please call back to discuss.

## 2013-11-16 NOTE — Telephone Encounter (Signed)
Pt has already spoken with Alfonse Ras about taking z pack and that it was all right with her Coumadin. Pt understands she can take the z pack Mylo Red RN

## 2013-11-22 ENCOUNTER — Ambulatory Visit (INDEPENDENT_AMBULATORY_CARE_PROVIDER_SITE_OTHER): Payer: Medicare Other | Admitting: *Deleted

## 2013-11-22 ENCOUNTER — Encounter: Payer: Self-pay | Admitting: Internal Medicine

## 2013-11-22 DIAGNOSIS — I4891 Unspecified atrial fibrillation: Secondary | ICD-10-CM | POA: Diagnosis not present

## 2013-11-22 DIAGNOSIS — J309 Allergic rhinitis, unspecified: Secondary | ICD-10-CM | POA: Diagnosis not present

## 2013-11-28 ENCOUNTER — Telehealth: Payer: Self-pay | Admitting: Pharmacist

## 2013-11-28 ENCOUNTER — Emergency Department (HOSPITAL_COMMUNITY)
Admission: EM | Admit: 2013-11-28 | Discharge: 2013-11-28 | Disposition: A | Discharge status: Home / Self Care | Payer: Medicare Other | Attending: Emergency Medicine | Admitting: Emergency Medicine

## 2013-11-28 ENCOUNTER — Emergency Department (HOSPITAL_COMMUNITY): Payer: Medicare Other

## 2013-11-28 ENCOUNTER — Encounter (HOSPITAL_COMMUNITY): Payer: Self-pay | Admitting: Emergency Medicine

## 2013-11-28 DIAGNOSIS — W1809XA Striking against other object with subsequent fall, initial encounter: Secondary | ICD-10-CM | POA: Insufficient documentation

## 2013-11-28 DIAGNOSIS — S0990XA Unspecified injury of head, initial encounter: Secondary | ICD-10-CM | POA: Insufficient documentation

## 2013-11-28 DIAGNOSIS — Z8719 Personal history of other diseases of the digestive system: Secondary | ICD-10-CM | POA: Insufficient documentation

## 2013-11-28 DIAGNOSIS — F411 Generalized anxiety disorder: Secondary | ICD-10-CM | POA: Insufficient documentation

## 2013-11-28 DIAGNOSIS — Z90710 Acquired absence of both cervix and uterus: Secondary | ICD-10-CM | POA: Insufficient documentation

## 2013-11-28 DIAGNOSIS — I4891 Unspecified atrial fibrillation: Secondary | ICD-10-CM | POA: Diagnosis not present

## 2013-11-28 DIAGNOSIS — Z950 Presence of cardiac pacemaker: Secondary | ICD-10-CM | POA: Insufficient documentation

## 2013-11-28 DIAGNOSIS — Y929 Unspecified place or not applicable: Secondary | ICD-10-CM | POA: Insufficient documentation

## 2013-11-28 DIAGNOSIS — I10 Essential (primary) hypertension: Secondary | ICD-10-CM | POA: Insufficient documentation

## 2013-11-28 DIAGNOSIS — Z7901 Long term (current) use of anticoagulants: Secondary | ICD-10-CM | POA: Insufficient documentation

## 2013-11-28 DIAGNOSIS — M25559 Pain in unspecified hip: Secondary | ICD-10-CM | POA: Diagnosis not present

## 2013-11-28 DIAGNOSIS — S7000XA Contusion of unspecified hip, initial encounter: Secondary | ICD-10-CM | POA: Diagnosis not present

## 2013-11-28 DIAGNOSIS — E039 Hypothyroidism, unspecified: Secondary | ICD-10-CM | POA: Diagnosis not present

## 2013-11-28 DIAGNOSIS — Z79899 Other long term (current) drug therapy: Secondary | ICD-10-CM | POA: Insufficient documentation

## 2013-11-28 DIAGNOSIS — Z86718 Personal history of other venous thrombosis and embolism: Secondary | ICD-10-CM | POA: Diagnosis not present

## 2013-11-28 DIAGNOSIS — M4120 Other idiopathic scoliosis, site unspecified: Secondary | ICD-10-CM | POA: Diagnosis not present

## 2013-11-28 DIAGNOSIS — S79919A Unspecified injury of unspecified hip, initial encounter: Secondary | ICD-10-CM | POA: Diagnosis not present

## 2013-11-28 DIAGNOSIS — S7001XA Contusion of right hip, initial encounter: Secondary | ICD-10-CM

## 2013-11-28 DIAGNOSIS — W19XXXA Unspecified fall, initial encounter: Secondary | ICD-10-CM

## 2013-11-28 DIAGNOSIS — Y9389 Activity, other specified: Secondary | ICD-10-CM | POA: Insufficient documentation

## 2013-11-28 LAB — CBC WITH DIFFERENTIAL/PLATELET
Eosinophils Absolute: 0 10*3/uL (ref 0.0–0.7)
Lymphocytes Relative: 17 % (ref 12–46)
Lymphs Abs: 0.7 10*3/uL (ref 0.7–4.0)
Monocytes Absolute: 0.3 10*3/uL (ref 0.1–1.0)
Neutro Abs: 3.2 10*3/uL (ref 1.7–7.7)
Neutrophils Relative %: 75 % (ref 43–77)
Platelets: 170 10*3/uL (ref 150–400)
RBC: 3.64 MIL/uL — ABNORMAL LOW (ref 3.87–5.11)
WBC: 4.2 10*3/uL (ref 4.0–10.5)

## 2013-11-28 LAB — COMPREHENSIVE METABOLIC PANEL
ALT: 21 U/L (ref 0–35)
Alkaline Phosphatase: 55 U/L (ref 39–117)
CO2: 29 mEq/L (ref 19–32)
Creatinine, Ser: 0.52 mg/dL (ref 0.50–1.10)
GFR calc Af Amer: >90 mL/min (ref >90)
Glucose, Bld: 95 mg/dL (ref 70–99)
Potassium: 4.5 mEq/L (ref 3.5–5.1)
Sodium: 136 mEq/L (ref 135–145)
Total Protein: 7 g/dL (ref 6.0–8.3)

## 2013-11-28 MED ORDER — HYDRALAZINE HCL 20 MG/ML IJ SOLN: 10.0000 mg | Freq: Once | INTRAMUSCULAR | Status: DC | PRN

## 2013-11-28 NOTE — ED Provider Notes (Signed)
CSN: [id number]     Arrival date & time 11/28/13  1455 History   First MD Initiated Contact with Patient 11/28/13 1557     Chief Complaint  Patient presents with  . Fall  . Head Injury   HPI  79 y/o female with history as noted below who presents after fall. The patient states that last night she got upset with her daughter after she left and kicked a trash can. She states that after kicking the trash can she fell backwards hitting the back of her head and her right hip. She denies LOC. She states she was able to get up and ambulate after the fall. She denies any headache or dizziness. Today she contacted her pcp for an evaluation and was instructed to come here. She is currently only complaining of pain in her right buttock. She denies any neck or back pain.   Past Medical History  Diagnosis Date  . HTN (hypertension)   . Permanent atrial fibrillation   . Bradycardia     s/p PPM  . Hypothyroidism   . Anxiety   . Diverticulitis     history of  . History of hysterectomy   . Long-term (current) use of anticoagulants   . Scoliosis (and kyphoscoliosis), idiopathic   . DVT (deep vein thrombosis) in pregnancy     right peroneal  . Dizziness and giddiness 07/22/2013   Past Surgical History  Procedure Laterality Date  . Pacemaker insertion  03/03/2006    most recent generator (MDT) change 10/30/06 by Dr Reyes Ivan  . Cardiac catheterization  01/08/1999    normal LV function  . Cardioversion  10/30/2006    Successful elective DC cardioversion  . Cardioversion  12/29/2002    Successful DC cardioversion  . Cardioversion  12/03/1999    Successful DC cardioversion  . Cholecystectomy     Family History  Problem Relation Age of Onset  . Coronary artery disease Mother   . Cancer - Other Father     Pancreatic Cancer   History  Substance Use Topics  . Smoking status: Never Smoker   . Smokeless tobacco: Never Used  . Alcohol Use: No   OB History   Grav Para Term Preterm Abortions TAB  SAB Ect Mult Living                 Review of Systems  Constitutional: Negative for fever and chills.  Respiratory: Negative for shortness of breath.   Cardiovascular: Negative for chest pain.  Gastrointestinal: Negative for nausea, vomiting and abdominal pain.  Musculoskeletal: Positive for arthralgias. Negative for back pain and neck pain.  Neurological: Negative for weakness, numbness and headaches.  All other systems reviewed and are negative.   Allergies  Review of patient's allergies indicates no known allergies.  Home Medications   Current Outpatient Rx  Name  Route  Sig  Dispense  Refill  . ALPRAZolam (XANAX) 0.25 MG tablet   Oral   Take 1 tablet (0.25 mg total) by mouth 3 (three) times daily as needed. For anxiety   30 tablet   3   . Calcium Carbonate-Vitamin D (CALCIUM-VITAMIN D) 600-200 MG-UNIT CAPS   Oral   Take 1 tablet by mouth daily as needed (for vitamin).          . cholecalciferol (VITAMIN D) 1000 UNITS tablet   Oral   Take 1,000 Units by mouth daily.         . furosemide (LASIX) 20 MG tablet   Oral  Take 20 mg by mouth daily as needed for fluid. Take 20 mg every other day         . levothyroxine (SYNTHROID, LEVOTHROID) 50 MCG tablet   Oral   Take 50 mcg by mouth daily.          . metoprolol succinate (TOPROL-XL) 25 MG 24 hr tablet   Oral   Take 75 mg by mouth 2 (two) times daily.          . Multiple Vitamin (MULTIVITAMIN WITH MINERALS) TABS   Oral   Take 1 tablet by mouth daily.         [name] warfarin (COUMADIN) 5 MG tablet   Oral   Take 5-7.5 mg by mouth daily. Take 1 tablet (5mg ) on Monday and Friday.  Take 1.5 tablet (7.5mg ) all other days          BP 145/54  Pulse 61  Temp(Src) 98.7 F (37.1 C) (Oral)  Resp 20  Wt 103 lb 9.6 oz (46.993 kg)  SpO2 97% Physical Exam  Nursing note and vitals reviewed. Constitutional: She is oriented to person, place, and time. She appears well-developed and well-nourished. No distress.   HENT:  Head: Normocephalic and atraumatic.  Eyes: Conjunctivae are normal. Pupils are equal, round, and reactive to light.  Neck: Normal range of motion. Neck supple.  Cardiovascular: Normal rate and regular rhythm.  Exam reveals no gallop and no friction rub.   No murmur heard. Pulmonary/Chest: Effort normal and breath sounds normal.  Abdominal: Soft. She exhibits no distension. There is no tenderness.  Musculoskeletal: Normal range of motion. She exhibits no edema and no tenderness.       Right hip: She exhibits no bony tenderness.       Left hip: She exhibits no bony tenderness.       Cervical back: She exhibits no bony tenderness.       Thoracic back: She exhibits no bony tenderness.       Lumbar back: She exhibits no bony tenderness.  Scoliosis   Neurological: She is alert and oriented to person, place, and time. She has normal strength and normal reflexes. No cranial nerve deficit or sensory deficit.  Skin: Skin is warm and dry.  Psychiatric: She has a normal mood and affect.   ED Course  Procedures (including critical care time) Labs Review Labs Reviewed  CBC WITH DIFFERENTIAL - Abnormal; Notable for the following:    RBC 3.64 (*)    Hemoglobin 11.4 (*)    HCT 34.6 (*)    RDW 15.9 (*)    All other components within normal limits  COMPREHENSIVE METABOLIC PANEL - Abnormal; Notable for the following:    Albumin 3.4 (*)    GFR calc non Af Amer 82 (*)    All other components within normal limits  PROTIME-INR - Abnormal; Notable for the following:    Prothrombin Time 25.2 (*)    INR 2.38 (*)    All other components within normal limits   Imaging Review Dg Hip Complete Right  11/28/2013   CLINICAL DATA:  Right hip pain status post fall  EXAM: RIGHT HIP - COMPLETE 2+ VIEW  COMPARISON:  None.  FINDINGS: The bones are osteopenic. There is no evidence of acute fracture nor dislocation. Areas of subchondral sclerosis are identified along superior aspects of the acetabulum on the  right and left. Degenerative changes are appreciated within the lower lumbar spine, and sacroiliac regions.  IMPRESSION: Degenerative changes without evidence of acute  osseous abnormalities.   Electronically Signed   By: Salome Holmes M.D.   On: 11/28/2013 17:51   Ct Head Wo Contrast  11/28/2013   CLINICAL DATA:  Fall.  On blood thinners.  EXAM: CT HEAD WITHOUT CONTRAST  TECHNIQUE: Contiguous axial images were obtained from the base of the skull through the vertex without intravenous contrast.  COMPARISON:  05/17/2013.  FINDINGS: No skull fracture or intracranial hemorrhage.  Small vessel disease type changes without CT evidence of large acute infarct.  Small arachnoid cyst left posterior fossa suspected and unchanged.  Vascular calcifications.  Mild mucosal thickening right maxillary sinus.  IMPRESSION: No skull fracture or intracranial hemorrhage.  Please see above.   Electronically Signed   By: Bridgett Larsson M.D.   On: 11/28/2013 16:14    EKG Interpretation   None      MDM   Fell last night. No denies headache. Non-focal neuro exam. CT head without acute abnormality. C,T,L spine non-tender. FROM of hips. XR without acute fracture. Labs unremarkable. The patient was able to ambulate prior to discharge without difficulty. Doubt occult hip fracture. Follow up with pcp. Return precautions given and discussed with the patient who was in agreement with the plan.   1. Fall, initial encounter   2. Contusion, hip, right, initial encounter          Shanon Ace, MD 11/29/13 [phone]

## 2013-11-28 NOTE — Progress Notes (Signed)
ED CM noted patient s/p fall. Pt. Presented to [facility] ED s/p fall  In to speak with patient after obtaining permission to speak with daughter present. Pt lives alone but is able to ambulate without assistance. States, that her grandson will come to check on her. Pt offered HH however, patient declines. ED CM asked patient if I can call to check on her tomorrow, patient is in agreement with plan. Verified contact number in record with patient.

## 2013-11-28 NOTE — ED Notes (Signed)
Pt to be d/c home with daughter. D/C instructions given and understood.

## 2013-11-28 NOTE — ED Notes (Signed)
Pt reports fall this afternoon after kicking garbage can, slipped onto floor. No LOC. Does take coumadin. Pain to back of head and tailbone area.

## 2013-11-28 NOTE — ED Notes (Signed)
Case Manager Burna Mortimer RN spoke with pt regarding homecare opportunities

## 2013-11-28 NOTE — Telephone Encounter (Signed)
Pt called to report she fell and hit her head last night.  She wants to come in and check her INR.  Explained to pt that any time she hits her head on Coumadin we need her to go to ER or PCP for evaluation.  Pt does not complain of any headache, vision changes, or loss of consciousness.  She will call her PCP and see if she can be seen, if not, go to urgent care or ER.

## 2013-11-28 NOTE — ED Notes (Signed)
MD at bedside. 

## 2013-11-28 NOTE — ED Provider Notes (Signed)
I saw and evaluated the patient, reviewed the resident's note and I agree with the findings and plan.  EKG Interpretation   None       This is a 79 year old female with a history of atrial fibrillation on Coumadin who presents following a fall. She reports head pain and tell them pain. Fall was yesterday evening. She denies loss of consciousness. Patient has had persistent tailbone pain which is worse when she sits. She has been able to ambulate.  Tenderness palpation over the left occiput without obvious hematoma. Tenderness palpation over the left buttock without evidence of bruising or hematoma. CT head negative. Plain films pending of the pelvis and right hip. If films are negative, patient can be discharged.  Shon Baton, MD 11/28/13 [phone]

## 2013-11-28 NOTE — ED Notes (Signed)
Pt ambulated w/o difficulty

## 2013-11-28 NOTE — ED Notes (Signed)
Dr. Horton at bedside. 

## 2013-11-29 NOTE — Progress Notes (Addendum)
ED CM attempted to contact patient  with f/u call. No answer or vooicemail at number provided in record. No further ED CM needs identified.

## 2013-11-29 NOTE — ED Provider Notes (Signed)
I saw and evaluated the patient, reviewed the resident's note and I agree with the findings and plan.  EKG Interpretation   None       See separate note  Shon Baton, MD 11/29/13 [phone]

## 2013-12-05 ENCOUNTER — Ambulatory Visit (INDEPENDENT_AMBULATORY_CARE_PROVIDER_SITE_OTHER): Payer: Medicare Other | Admitting: Pharmacist

## 2013-12-05 DIAGNOSIS — L299 Pruritus, unspecified: Secondary | ICD-10-CM | POA: Diagnosis not present

## 2013-12-05 DIAGNOSIS — D235 Other benign neoplasm of skin of trunk: Secondary | ICD-10-CM | POA: Diagnosis not present

## 2013-12-05 DIAGNOSIS — Z85828 Personal history of other malignant neoplasm of skin: Secondary | ICD-10-CM | POA: Diagnosis not present

## 2013-12-05 DIAGNOSIS — I4891 Unspecified atrial fibrillation: Secondary | ICD-10-CM

## 2013-12-05 DIAGNOSIS — L408 Other psoriasis: Secondary | ICD-10-CM | POA: Diagnosis not present

## 2013-12-05 DIAGNOSIS — I8310 Varicose veins of unspecified lower extremity with inflammation: Secondary | ICD-10-CM | POA: Diagnosis not present

## 2013-12-05 LAB — POCT INR: INR: 2.3

## 2013-12-06 ENCOUNTER — Telehealth: Payer: Self-pay | Admitting: Cardiology

## 2013-12-06 NOTE — Telephone Encounter (Signed)
Error// sent to medications department

## 2013-12-12 DIAGNOSIS — F411 Generalized anxiety disorder: Secondary | ICD-10-CM | POA: Diagnosis not present

## 2013-12-12 DIAGNOSIS — R141 Gas pain: Secondary | ICD-10-CM | POA: Diagnosis not present

## 2013-12-12 DIAGNOSIS — I4891 Unspecified atrial fibrillation: Secondary | ICD-10-CM | POA: Diagnosis not present

## 2013-12-12 DIAGNOSIS — D696 Thrombocytopenia, unspecified: Secondary | ICD-10-CM | POA: Diagnosis not present

## 2013-12-12 DIAGNOSIS — R143 Flatulence: Secondary | ICD-10-CM | POA: Diagnosis not present

## 2013-12-12 DIAGNOSIS — K589 Irritable bowel syndrome without diarrhea: Secondary | ICD-10-CM | POA: Diagnosis not present

## 2013-12-12 DIAGNOSIS — K5730 Diverticulosis of large intestine without perforation or abscess without bleeding: Secondary | ICD-10-CM | POA: Diagnosis not present

## 2013-12-12 DIAGNOSIS — L439 Lichen planus, unspecified: Secondary | ICD-10-CM | POA: Diagnosis not present

## 2013-12-12 DIAGNOSIS — E7800 Pure hypercholesterolemia, unspecified: Secondary | ICD-10-CM | POA: Diagnosis not present

## 2013-12-12 DIAGNOSIS — E039 Hypothyroidism, unspecified: Secondary | ICD-10-CM | POA: Diagnosis not present

## 2013-12-14 ENCOUNTER — Telehealth: Payer: Self-pay

## 2013-12-14 MED ORDER — ALPRAZOLAM 0.25 MG PO TABS: 0.2500 mg | ORAL_TABLET | Freq: Three times a day (TID) | ORAL | Status: DC | PRN

## 2013-12-14 NOTE — Telephone Encounter (Signed)
Returned to call patient alprazolam refill sent to [facility].

## 2013-12-19 ENCOUNTER — Encounter: Payer: Self-pay | Admitting: Cardiology

## 2013-12-19 ENCOUNTER — Ambulatory Visit (INDEPENDENT_AMBULATORY_CARE_PROVIDER_SITE_OTHER): Payer: Medicare Other | Admitting: Cardiology

## 2013-12-19 VITALS — BP 132/74 | HR 79 | Ht <= 58 in | Wt 105.4 lb

## 2013-12-19 DIAGNOSIS — I495 Sick sinus syndrome: Secondary | ICD-10-CM

## 2013-12-19 DIAGNOSIS — I10 Essential (primary) hypertension: Secondary | ICD-10-CM

## 2013-12-19 DIAGNOSIS — Z7901 Long term (current) use of anticoagulants: Secondary | ICD-10-CM

## 2013-12-19 DIAGNOSIS — I4891 Unspecified atrial fibrillation: Secondary | ICD-10-CM | POA: Diagnosis not present

## 2013-12-19 NOTE — Patient Instructions (Signed)
Continue your current therapy  I will see you in 6 months.   

## 2013-12-19 NOTE — Progress Notes (Signed)
Christy Miranda Date of Birth: [birth year]   History of Present Illness: Christy Miranda and is seen today for followup. She history of permanent atrial fibrillation with sick sinus syndrome. She is status post pacemaker implant with revision of her generator in January 2014. Following this procedure she developed a DVT. She is on chronic Coumadin. Overall she is doing very well from a cardiac standpoint. She denies any significant palpitations.  She did fall on Dec 28th and bruised her buttocks. She went to the ED the next day and hip films and cranial CT were unremarkable. She still has swelling in her ankles. Can't get thigh high support hose on. Tries very hard to avoid salt and keeps her feet elevated when possible.  Current Outpatient Prescriptions on File Prior to Visit  Medication Sig Dispense Refill  . ALPRAZolam (XANAX) 0.25 MG tablet Take 1 tablet (0.25 mg total) by mouth 3 (three) times daily as needed. For anxiety  270 tablet  3  . Calcium Carbonate-Vitamin D (CALCIUM-VITAMIN D) 600-200 MG-UNIT CAPS Take 1 tablet by mouth daily as needed (for vitamin).       . cholecalciferol (VITAMIN D) 1000 UNITS tablet Take 1,000 Units by mouth daily.      . furosemide (LASIX) 20 MG tablet Take 20 mg by mouth daily as needed for fluid. Take 20 mg every other day      . levothyroxine (SYNTHROID, LEVOTHROID) 50 MCG tablet Take 50 mcg by mouth daily.       . metoprolol succinate (TOPROL-XL) 25 MG 24 hr tablet Take 75 mg by mouth 2 (two) times daily.       . Multiple Vitamin (MULTIVITAMIN WITH MINERALS) TABS Take 1 tablet by mouth daily.      [name] warfarin (COUMADIN) 5 MG tablet Take 5-7.5 mg by mouth daily. Take 1 tablet (5mg ) on Monday and Friday.  Take 1.5 tablet (7.5mg ) all other days       No current facility-administered medications on file prior to visit.    No Known Allergies  Past Medical History  Diagnosis Date  . HTN (hypertension)   . Permanent atrial fibrillation   . Bradycardia     s/p  PPM  . Hypothyroidism   . Anxiety   . Diverticulitis     history of  . History of hysterectomy   . Long-term (current) use of anticoagulants   . Scoliosis (and kyphoscoliosis), idiopathic   . DVT (deep vein thrombosis) in pregnancy     right peroneal  . Dizziness and giddiness 07/22/2013    Past Surgical History  Procedure Laterality Date  . Pacemaker insertion  03/03/2006    most recent generator (MDT) change 10/30/06 by Dr Verlon Setting  . Cardiac catheterization  01/08/1999    normal LV function  . Cardioversion  10/30/2006    Successful elective DC cardioversion  . Cardioversion  12/29/2002    Successful DC cardioversion  . Cardioversion  12/03/1999    Successful DC cardioversion  . Cholecystectomy      History  Smoking status  . Never Smoker   Smokeless tobacco  . Never Used    History  Alcohol Use No    Family History  Problem Relation Age of Onset  . Coronary artery disease Mother   . Cancer - Other Father     Pancreatic Cancer    Review of Systems: As noted in history of present illness. Pacer site has healed well. All other systems were reviewed and are negative.  Physical Exam:  BP 132/74  Pulse 79  Ht 4\' 8"  (1.422 m)  Wt 105 lb 6.4 oz (47.809 kg)  BMI 23.64 kg/m2 She is a pleasant elderly white female in no acute distress. HEENT is normal. Neck is supple without JVD, adenopathy, thyromegaly, or bruits. Lungs are clear. Cardiac exam reveals a irregular rate and rhythm without gallop, murmur, or click. Her pacemaker site has healed completely. Abdomen is soft and nontender. Spine reveals severe kyphoscoliosis. She has trace-1+ right calf edema. Pulses are 2+.   Skin is warm and dry. She is alert and oriented x3. Cranial nerves II through XII are intact.   LABORATORY DATA: INR 2.3  Pacemaker check in Dec 2014 was satisfactory  Assessment / Plan: 1. Tachybradycardia syndrome. Heart rate is well controlled on metoprolol. She is on chronic anticoagulation  with Coumadin.  2. Hypertension, controlled.  3. Right calf DVT. Resolved.  4. Chronic edema. Continue current lasix dose and conservative therapy.

## 2013-12-26 DIAGNOSIS — L408 Other psoriasis: Secondary | ICD-10-CM | POA: Diagnosis not present

## 2013-12-26 DIAGNOSIS — I8310 Varicose veins of unspecified lower extremity with inflammation: Secondary | ICD-10-CM | POA: Diagnosis not present

## 2013-12-26 DIAGNOSIS — Z85828 Personal history of other malignant neoplasm of skin: Secondary | ICD-10-CM | POA: Diagnosis not present

## 2014-01-02 ENCOUNTER — Ambulatory Visit (INDEPENDENT_AMBULATORY_CARE_PROVIDER_SITE_OTHER): Payer: Medicare Other | Admitting: *Deleted

## 2014-01-02 DIAGNOSIS — Z5181 Encounter for therapeutic drug level monitoring: Secondary | ICD-10-CM

## 2014-01-02 DIAGNOSIS — I4891 Unspecified atrial fibrillation: Secondary | ICD-10-CM

## 2014-01-02 LAB — POCT INR: INR: 2.3

## 2014-01-19 ENCOUNTER — Telehealth: Payer: Self-pay | Admitting: Cardiology

## 2014-01-19 MED ORDER — LEVOTHYROXINE SODIUM 50 MCG PO TABS: 50.0000 ug | ORAL_TABLET | Freq: Every day | ORAL | Status: AC

## 2014-01-19 MED ORDER — METOPROLOL SUCCINATE ER 25 MG PO TB24: 75.0000 mg | ORAL_TABLET | Freq: Two times a day (BID) | ORAL | Status: DC

## 2014-01-19 MED ORDER — FUROSEMIDE 20 MG PO TABS: 20.0000 mg | ORAL_TABLET | Freq: Every day | ORAL | Status: DC | PRN

## 2014-01-19 NOTE — Telephone Encounter (Signed)
New Problem:  Pt is requesting a call back from [place]. States she will give more details when Agar calls back.Marland KitchenMarland Kitchen

## 2014-01-19 NOTE — Telephone Encounter (Signed)
Returned call to patient she stated she needed 90 day refills on her medications.Refills sent to pharmacy.Also stated she is having pain on the side of her right upper thigh.Stated pain started this morning and has not injured thigh.Stated she has appointment with her PCP tomorrow.Advised ok to take tylenol as needed.

## 2014-01-20 ENCOUNTER — Other Ambulatory Visit: Payer: Self-pay

## 2014-01-20 DIAGNOSIS — Z7901 Long term (current) use of anticoagulants: Secondary | ICD-10-CM | POA: Diagnosis not present

## 2014-01-20 DIAGNOSIS — I4891 Unspecified atrial fibrillation: Secondary | ICD-10-CM | POA: Diagnosis not present

## 2014-01-20 DIAGNOSIS — M533 Sacrococcygeal disorders, not elsewhere classified: Secondary | ICD-10-CM | POA: Diagnosis not present

## 2014-01-20 MED ORDER — WARFARIN SODIUM 5 MG PO TABS: ORAL_TABLET | ORAL | Status: DC

## 2014-01-23 ENCOUNTER — Ambulatory Visit: Payer: Medicare Other | Admitting: Podiatry

## 2014-01-30 ENCOUNTER — Ambulatory Visit: Payer: Medicare Other | Admitting: Podiatry

## 2014-01-30 DIAGNOSIS — M48061 Spinal stenosis, lumbar region without neurogenic claudication: Secondary | ICD-10-CM | POA: Diagnosis not present

## 2014-01-30 DIAGNOSIS — M545 Low back pain, unspecified: Secondary | ICD-10-CM | POA: Diagnosis not present

## 2014-01-30 DIAGNOSIS — M4120 Other idiopathic scoliosis, site unspecified: Secondary | ICD-10-CM | POA: Diagnosis not present

## 2014-01-30 DIAGNOSIS — M5137 Other intervertebral disc degeneration, lumbosacral region: Secondary | ICD-10-CM | POA: Diagnosis not present

## 2014-02-06 ENCOUNTER — Ambulatory Visit (INDEPENDENT_AMBULATORY_CARE_PROVIDER_SITE_OTHER): Payer: Medicare Other | Admitting: *Deleted

## 2014-02-06 DIAGNOSIS — I8310 Varicose veins of unspecified lower extremity with inflammation: Secondary | ICD-10-CM | POA: Diagnosis not present

## 2014-02-06 DIAGNOSIS — I4891 Unspecified atrial fibrillation: Secondary | ICD-10-CM | POA: Diagnosis not present

## 2014-02-06 DIAGNOSIS — Z85828 Personal history of other malignant neoplasm of skin: Secondary | ICD-10-CM | POA: Diagnosis not present

## 2014-02-06 DIAGNOSIS — Z5181 Encounter for therapeutic drug level monitoring: Secondary | ICD-10-CM

## 2014-02-06 DIAGNOSIS — D239 Other benign neoplasm of skin, unspecified: Secondary | ICD-10-CM | POA: Diagnosis not present

## 2014-02-06 DIAGNOSIS — L408 Other psoriasis: Secondary | ICD-10-CM | POA: Diagnosis not present

## 2014-02-06 LAB — POCT INR: INR: 4.4

## 2014-02-08 ENCOUNTER — Telehealth: Payer: Self-pay | Admitting: Cardiology

## 2014-02-08 NOTE — Telephone Encounter (Signed)
New Message  Pt requesting a call back to discuss medication. ( No further details, pt is determined to discuss medications and back symptoms with nurse only) please assist

## 2014-02-09 NOTE — Telephone Encounter (Signed)
Returned call to patient no answer.LMTC. 

## 2014-02-10 ENCOUNTER — Ambulatory Visit: Payer: Medicare Other | Admitting: Neurology

## 2014-02-10 NOTE — Telephone Encounter (Signed)
Returned call to patient 02/09/14.Patient calling to report she saw Dr.Ramos recently for pain in right lower back.Stated she was told she has lumbar degenerative disc disease,spinal stenosis.Stated she was prescribed taper prednisone,INR was 4.1 and coumadin adjusted.Stated she feels weak,tired.Stated she has appointment with Dr.Ramos 02/27/14.Advised to call PCP.

## 2014-02-20 ENCOUNTER — Ambulatory Visit (INDEPENDENT_AMBULATORY_CARE_PROVIDER_SITE_OTHER): Payer: Medicare Other | Admitting: Pharmacist

## 2014-02-20 DIAGNOSIS — Z5181 Encounter for therapeutic drug level monitoring: Secondary | ICD-10-CM

## 2014-02-20 DIAGNOSIS — I4891 Unspecified atrial fibrillation: Secondary | ICD-10-CM

## 2014-02-20 LAB — POCT INR: INR: 3.1

## 2014-02-23 ENCOUNTER — Telehealth: Payer: Self-pay | Admitting: Cardiology

## 2014-02-23 DIAGNOSIS — R600 Localized edema: Secondary | ICD-10-CM

## 2014-02-23 NOTE — Telephone Encounter (Signed)
New message     Pt has questions about lasix.

## 2014-02-23 NOTE — Telephone Encounter (Signed)
Returned call to patient she stated she has noticed for the last 2 weeks " hard knots in back of both lower legs".Stated no pain in lower legs.Knots get harder and more swollen as day goes by.Stated she is wearing compression stockings and keeping legs and feet propped up when she is sitting down.Stated she wanted to ask Dr.Jordan about these knots in her legs.She is concerned about having a blood clot.Will check with Dr.Jordan and call her back.

## 2014-02-24 NOTE — Telephone Encounter (Signed)
Returned call to patient Dr.Jordan advised lower ext venous dopplers.Schedulers will call back today to schedule for possible 02/27/14 or 02/28/14.

## 2014-02-24 NOTE — Telephone Encounter (Signed)
Patient scheduled for lower ext venous dopplers Monday 02/27/14 at 1:30 pm.

## 2014-02-27 ENCOUNTER — Ambulatory Visit (HOSPITAL_COMMUNITY): Payer: Medicare Other | Attending: Cardiology | Admitting: Cardiology

## 2014-02-27 DIAGNOSIS — R609 Edema, unspecified: Secondary | ICD-10-CM

## 2014-02-27 DIAGNOSIS — R600 Localized edema: Secondary | ICD-10-CM

## 2014-02-27 DIAGNOSIS — M7989 Other specified soft tissue disorders: Secondary | ICD-10-CM | POA: Diagnosis not present

## 2014-02-27 DIAGNOSIS — Z7901 Long term (current) use of anticoagulants: Secondary | ICD-10-CM | POA: Diagnosis not present

## 2014-02-27 NOTE — Progress Notes (Signed)
Bilateral lower extremity venous duplex performed 

## 2014-03-06 ENCOUNTER — Telehealth: Payer: Self-pay | Admitting: Cardiology

## 2014-03-06 NOTE — Telephone Encounter (Signed)
Patient is returning your call. Please call back.  °

## 2014-03-06 NOTE — Telephone Encounter (Signed)
Spoke to patient Dr.Jordan reviewed  02/27/14 venous dopplers no evidence of blood clot.Copy faxed to PCP Dr.Sharon Stephanie Acre.

## 2014-03-06 NOTE — Telephone Encounter (Signed)
Patient called no answer.Left message on personal voice mail call me back for 02/27/14 lower ext venous dopplers.

## 2014-03-06 NOTE — Telephone Encounter (Signed)
See previous 03/06/14 note.

## 2014-03-13 ENCOUNTER — Ambulatory Visit (INDEPENDENT_AMBULATORY_CARE_PROVIDER_SITE_OTHER): Payer: Medicare Other | Admitting: *Deleted

## 2014-03-13 ENCOUNTER — Encounter: Payer: Self-pay | Admitting: Podiatry

## 2014-03-13 ENCOUNTER — Ambulatory Visit (INDEPENDENT_AMBULATORY_CARE_PROVIDER_SITE_OTHER): Payer: Medicare Other | Admitting: Podiatry

## 2014-03-13 VITALS — Ht 60.0 in | Wt 105.0 lb

## 2014-03-13 DIAGNOSIS — Z5181 Encounter for therapeutic drug level monitoring: Secondary | ICD-10-CM

## 2014-03-13 DIAGNOSIS — M79609 Pain in unspecified limb: Secondary | ICD-10-CM | POA: Diagnosis not present

## 2014-03-13 DIAGNOSIS — I4891 Unspecified atrial fibrillation: Secondary | ICD-10-CM | POA: Diagnosis not present

## 2014-03-13 DIAGNOSIS — B351 Tinea unguium: Secondary | ICD-10-CM | POA: Diagnosis not present

## 2014-03-13 LAB — POCT INR: INR: 2.5

## 2014-03-13 NOTE — Progress Notes (Signed)
   Subjective:    Patient ID: Christy Miranda, female    DOB: 1921/06/24, 80 y.o.   MRN: [id number]  HPI Comments: Pt presents for debridement of B/L 1 - 5 toenails.  The last visit for similar service was 05/02/2013    Review of Systems     Objective:   Physical Exam Orientated x3 white female  Dermatological: Brittle, elongated, discolored toenails with palpable tenderness in these nails x7       Assessment & Plan:   Assessment: Neglected symptomatic onychomycoses x7  Plan: Nails x7 were debrided without a bleeding Reappoint at three-month intervals

## 2014-03-24 ENCOUNTER — Encounter: Payer: Self-pay | Admitting: Cardiology

## 2014-03-24 DIAGNOSIS — R609 Edema, unspecified: Secondary | ICD-10-CM | POA: Diagnosis not present

## 2014-04-05 ENCOUNTER — Telehealth: Payer: Self-pay

## 2014-04-05 MED ORDER — ALPRAZOLAM 0.25 MG PO TABS: 0.2500 mg | ORAL_TABLET | Freq: Three times a day (TID) | ORAL | Status: DC | PRN

## 2014-04-05 NOTE — Telephone Encounter (Signed)
Alprazolam refill sent to pharmacy.

## 2014-04-07 ENCOUNTER — Ambulatory Visit (INDEPENDENT_AMBULATORY_CARE_PROVIDER_SITE_OTHER): Payer: Medicare Other | Admitting: *Deleted

## 2014-04-07 DIAGNOSIS — I4891 Unspecified atrial fibrillation: Secondary | ICD-10-CM

## 2014-04-07 DIAGNOSIS — Z5181 Encounter for therapeutic drug level monitoring: Secondary | ICD-10-CM | POA: Diagnosis not present

## 2014-04-07 LAB — POCT INR: INR: 2.9

## 2014-04-13 DIAGNOSIS — R933 Abnormal findings on diagnostic imaging of other parts of digestive tract: Secondary | ICD-10-CM | POA: Diagnosis not present

## 2014-04-13 DIAGNOSIS — R1900 Intra-abdominal and pelvic swelling, mass and lump, unspecified site: Secondary | ICD-10-CM | POA: Diagnosis not present

## 2014-04-13 DIAGNOSIS — R110 Nausea: Secondary | ICD-10-CM | POA: Diagnosis not present

## 2014-04-13 DIAGNOSIS — K591 Functional diarrhea: Secondary | ICD-10-CM | POA: Diagnosis not present

## 2014-04-18 ENCOUNTER — Telehealth: Payer: Self-pay

## 2014-04-18 DIAGNOSIS — I10 Essential (primary) hypertension: Secondary | ICD-10-CM

## 2014-04-18 NOTE — Telephone Encounter (Signed)
Spoke to patient she stated she has been taking lasix 20 mg daily for the past 2 weeks.Stated swelling in lower legs improved.Dr.Jordan advised will need bmet.Patient scheduled for bmet same day as next INR 05/05/14.

## 2014-04-25 DIAGNOSIS — L408 Other psoriasis: Secondary | ICD-10-CM | POA: Diagnosis not present

## 2014-04-25 DIAGNOSIS — I8310 Varicose veins of unspecified lower extremity with inflammation: Secondary | ICD-10-CM | POA: Diagnosis not present

## 2014-04-25 DIAGNOSIS — Z85828 Personal history of other malignant neoplasm of skin: Secondary | ICD-10-CM | POA: Diagnosis not present

## 2014-05-04 ENCOUNTER — Other Ambulatory Visit (INDEPENDENT_AMBULATORY_CARE_PROVIDER_SITE_OTHER): Payer: Medicare Other

## 2014-05-04 ENCOUNTER — Ambulatory Visit (INDEPENDENT_AMBULATORY_CARE_PROVIDER_SITE_OTHER): Payer: Medicare Other | Admitting: Pharmacist

## 2014-05-04 DIAGNOSIS — I4891 Unspecified atrial fibrillation: Secondary | ICD-10-CM

## 2014-05-04 DIAGNOSIS — Z5181 Encounter for therapeutic drug level monitoring: Secondary | ICD-10-CM | POA: Diagnosis not present

## 2014-05-04 DIAGNOSIS — I10 Essential (primary) hypertension: Secondary | ICD-10-CM

## 2014-05-04 LAB — POCT INR: INR: 2.7

## 2014-05-05 ENCOUNTER — Other Ambulatory Visit: Payer: Medicare Other

## 2014-05-05 LAB — BASIC METABOLIC PANEL
BUN: 14 mg/dL (ref 6–23)
CHLORIDE: 98 meq/L (ref 96–112)
CO2: 31 mEq/L (ref 19–32)
Calcium: 9.4 mg/dL (ref 8.4–10.5)
Creatinine, Ser: 0.5 mg/dL (ref 0.4–1.2)
GFR: 114.95 mL/min (ref >60.00)
GLUCOSE: 110 mg/dL — AB (ref 70–99)
Potassium: 4.3 mEq/L (ref 3.5–5.1)
SODIUM: 135 meq/L (ref 135–145)

## 2014-05-15 ENCOUNTER — Ambulatory Visit (INDEPENDENT_AMBULATORY_CARE_PROVIDER_SITE_OTHER): Payer: Medicare Other | Admitting: *Deleted

## 2014-05-15 DIAGNOSIS — I4891 Unspecified atrial fibrillation: Secondary | ICD-10-CM

## 2014-05-15 LAB — MDC_IDC_ENUM_SESS_TYPE_INCLINIC
Brady Statistic RV Percent Paced: 30 %
Date Time Interrogation Session: 20150615204741
Lead Channel Impedance Value: 0 Ohm
Lead Channel Impedance Value: 448 Ohm
Lead Channel Pacing Threshold Amplitude: 1 V
Lead Channel Sensing Intrinsic Amplitude: 5.6 mV
Lead Channel Setting Pacing Amplitude: 2.5 V
Lead Channel Setting Pacing Pulse Width: 0.4 ms
MDC IDC MSMT BATTERY IMPEDANCE: 206 Ohm
MDC IDC MSMT BATTERY REMAINING LONGEVITY: 110 mo
MDC IDC MSMT BATTERY VOLTAGE: 2.79 V
MDC IDC MSMT LEADCHNL RV PACING THRESHOLD PULSEWIDTH: 0.4 ms
MDC IDC SET LEADCHNL RV SENSING SENSITIVITY: 2 mV

## 2014-05-15 NOTE — Progress Notes (Signed)
Pacemaker check in clinic. Normal device function. Threshold, sensing, and impedance consistent with previous measurements. Device programmed to maximize longevity. Permanent AF + Warfarin. No high ventricular rates noted. Device programmed at appropriate safety margins. Histogram distribution appropriate for patient activity level. Device programmed to optimize intrinsic conduction. Estimated longevity 9 years. Patient to follow up with JA in 6 months.

## 2014-05-17 DIAGNOSIS — L408 Other psoriasis: Secondary | ICD-10-CM | POA: Diagnosis not present

## 2014-05-17 DIAGNOSIS — Z85828 Personal history of other malignant neoplasm of skin: Secondary | ICD-10-CM | POA: Diagnosis not present

## 2014-05-17 DIAGNOSIS — I8310 Varicose veins of unspecified lower extremity with inflammation: Secondary | ICD-10-CM | POA: Diagnosis not present

## 2014-05-21 ENCOUNTER — Emergency Department (HOSPITAL_COMMUNITY): Payer: Medicare Other

## 2014-05-21 ENCOUNTER — Inpatient Hospital Stay (HOSPITAL_COMMUNITY)
Admission: EM | Admit: 2014-05-21 | Discharge: 2014-05-25 | DRG: 536 | Disposition: A | Discharge status: Skilled Nursing Facility | Payer: Medicare Other | Attending: Internal Medicine | Admitting: Internal Medicine

## 2014-05-21 ENCOUNTER — Encounter (HOSPITAL_COMMUNITY): Payer: Self-pay | Admitting: Emergency Medicine

## 2014-05-21 DIAGNOSIS — Z8249 Family history of ischemic heart disease and other diseases of the circulatory system: Secondary | ICD-10-CM | POA: Diagnosis not present

## 2014-05-21 DIAGNOSIS — Z86718 Personal history of other venous thrombosis and embolism: Secondary | ICD-10-CM

## 2014-05-21 DIAGNOSIS — S79919A Unspecified injury of unspecified hip, initial encounter: Secondary | ICD-10-CM | POA: Diagnosis not present

## 2014-05-21 DIAGNOSIS — W010XXA Fall on same level from slipping, tripping and stumbling without subsequent striking against object, initial encounter: Secondary | ICD-10-CM | POA: Diagnosis present

## 2014-05-21 DIAGNOSIS — D696 Thrombocytopenia, unspecified: Secondary | ICD-10-CM

## 2014-05-21 DIAGNOSIS — F411 Generalized anxiety disorder: Secondary | ICD-10-CM | POA: Diagnosis present

## 2014-05-21 DIAGNOSIS — Z23 Encounter for immunization: Secondary | ICD-10-CM | POA: Diagnosis not present

## 2014-05-21 DIAGNOSIS — M25519 Pain in unspecified shoulder: Secondary | ICD-10-CM | POA: Diagnosis not present

## 2014-05-21 DIAGNOSIS — Y92009 Unspecified place in unspecified non-institutional (private) residence as the place of occurrence of the external cause: Secondary | ICD-10-CM | POA: Diagnosis not present

## 2014-05-21 DIAGNOSIS — R55 Syncope and collapse: Secondary | ICD-10-CM | POA: Diagnosis not present

## 2014-05-21 DIAGNOSIS — Z950 Presence of cardiac pacemaker: Secondary | ICD-10-CM | POA: Diagnosis not present

## 2014-05-21 DIAGNOSIS — M4120 Other idiopathic scoliosis, site unspecified: Secondary | ICD-10-CM | POA: Diagnosis present

## 2014-05-21 DIAGNOSIS — S0083XA Contusion of other part of head, initial encounter: Secondary | ICD-10-CM | POA: Diagnosis not present

## 2014-05-21 DIAGNOSIS — W19XXXA Unspecified fall, initial encounter: Secondary | ICD-10-CM | POA: Diagnosis not present

## 2014-05-21 DIAGNOSIS — Z7901 Long term (current) use of anticoagulants: Secondary | ICD-10-CM

## 2014-05-21 DIAGNOSIS — R609 Edema, unspecified: Secondary | ICD-10-CM

## 2014-05-21 DIAGNOSIS — I10 Essential (primary) hypertension: Secondary | ICD-10-CM | POA: Diagnosis not present

## 2014-05-21 DIAGNOSIS — D61818 Other pancytopenia: Secondary | ICD-10-CM | POA: Diagnosis present

## 2014-05-21 DIAGNOSIS — S0003XA Contusion of scalp, initial encounter: Secondary | ICD-10-CM | POA: Diagnosis not present

## 2014-05-21 DIAGNOSIS — S0510XA Contusion of eyeball and orbital tissues, unspecified eye, initial encounter: Secondary | ICD-10-CM | POA: Diagnosis present

## 2014-05-21 DIAGNOSIS — M25559 Pain in unspecified hip: Secondary | ICD-10-CM | POA: Diagnosis not present

## 2014-05-21 DIAGNOSIS — R42 Dizziness and giddiness: Secondary | ICD-10-CM

## 2014-05-21 DIAGNOSIS — S32599A Other specified fracture of unspecified pubis, initial encounter for closed fracture: Secondary | ICD-10-CM

## 2014-05-21 DIAGNOSIS — I495 Sick sinus syndrome: Secondary | ICD-10-CM

## 2014-05-21 DIAGNOSIS — R269 Unspecified abnormalities of gait and mobility: Secondary | ICD-10-CM | POA: Diagnosis not present

## 2014-05-21 DIAGNOSIS — S32509A Unspecified fracture of unspecified pubis, initial encounter for closed fracture: Principal | ICD-10-CM | POA: Diagnosis present

## 2014-05-21 DIAGNOSIS — S1093XA Contusion of unspecified part of neck, initial encounter: Secondary | ICD-10-CM | POA: Diagnosis present

## 2014-05-21 DIAGNOSIS — S46909A Unspecified injury of unspecified muscle, fascia and tendon at shoulder and upper arm level, unspecified arm, initial encounter: Secondary | ICD-10-CM | POA: Diagnosis not present

## 2014-05-21 DIAGNOSIS — S4980XA Other specified injuries of shoulder and upper arm, unspecified arm, initial encounter: Secondary | ICD-10-CM | POA: Diagnosis not present

## 2014-05-21 DIAGNOSIS — Z5181 Encounter for therapeutic drug level monitoring: Secondary | ICD-10-CM

## 2014-05-21 DIAGNOSIS — S32592A Other specified fracture of left pubis, initial encounter for closed fracture: Secondary | ICD-10-CM

## 2014-05-21 DIAGNOSIS — I482 Chronic atrial fibrillation, unspecified: Secondary | ICD-10-CM

## 2014-05-21 DIAGNOSIS — I824Z9 Acute embolism and thrombosis of unspecified deep veins of unspecified distal lower extremity: Secondary | ICD-10-CM

## 2014-05-21 DIAGNOSIS — R21 Rash and other nonspecific skin eruption: Secondary | ICD-10-CM | POA: Diagnosis not present

## 2014-05-21 DIAGNOSIS — S0993XA Unspecified injury of face, initial encounter: Secondary | ICD-10-CM | POA: Diagnosis not present

## 2014-05-21 DIAGNOSIS — I82819 Embolism and thrombosis of superficial veins of unspecified lower extremities: Secondary | ICD-10-CM | POA: Diagnosis not present

## 2014-05-21 DIAGNOSIS — S0990XA Unspecified injury of head, initial encounter: Secondary | ICD-10-CM | POA: Diagnosis not present

## 2014-05-21 DIAGNOSIS — I4891 Unspecified atrial fibrillation: Secondary | ICD-10-CM

## 2014-05-21 DIAGNOSIS — E039 Hypothyroidism, unspecified: Secondary | ICD-10-CM | POA: Diagnosis present

## 2014-05-21 DIAGNOSIS — Z800 Family history of malignant neoplasm of digestive organs: Secondary | ICD-10-CM

## 2014-05-21 DIAGNOSIS — S72009D Fracture of unspecified part of neck of unspecified femur, subsequent encounter for closed fracture with routine healing: Secondary | ICD-10-CM | POA: Diagnosis not present

## 2014-05-21 DIAGNOSIS — R791 Abnormal coagulation profile: Secondary | ICD-10-CM

## 2014-05-21 DIAGNOSIS — R404 Transient alteration of awareness: Secondary | ICD-10-CM | POA: Diagnosis not present

## 2014-05-21 DIAGNOSIS — R918 Other nonspecific abnormal finding of lung field: Secondary | ICD-10-CM | POA: Diagnosis not present

## 2014-05-21 LAB — PROTIME-INR
INR: 2.64 — AB (ref 0.00–1.49)
Prothrombin Time: 27.3 seconds — ABNORMAL HIGH (ref 11.6–15.2)

## 2014-05-21 MED ORDER — TETANUS-DIPHTH-ACELL PERTUSSIS 5-2.5-18.5 LF-MCG/0.5 IM SUSP
0.5000 mL | Freq: Once | INTRAMUSCULAR | Status: AC
  Administered 2014-05-21: 0.5 mL via INTRAMUSCULAR
  Filled 2014-05-21: qty 0.5

## 2014-05-21 NOTE — ED Notes (Signed)
Patient transported to X-ray 

## 2014-05-21 NOTE — ED Notes (Signed)
Emily PA at bedside

## 2014-05-21 NOTE — ED Provider Notes (Signed)
CSN: [id number]     Arrival date & time 05/21/14  2044 History   First MD Initiated Contact with Patient 05/21/14 2055     Chief Complaint  Patient presents with  . Fall     (Consider location/radiation/quality/duration/timing/severity/associated sxs/prior Treatment) HPI  Patient on coumadin for Afib presents after fall just prior to arrival.  States she is being treated by a dermatologist for a skin lesion on her foot and therefore has been wearing different kinds of shoes to keep pressure off of it - was wearing slippers without backs while walking up the driveway and tripped on them, falling to the ground on her left side.  She did hit her head but denies LOC.  Daughter reports patient was alert and oriented, not confused or altered after it happened.  Pt denies any preceding symptoms.  Pt reports pain primarily in her left hip but also in her left shoulder and around her left eye, where she has a lot of bruising.  Denies headache, neck or back pain.  Denies any other pain.  Denies weakness or numbness of the extremities.  Denies any other recent symptoms.    Past Medical History  Diagnosis Date  . HTN (hypertension)   . Permanent atrial fibrillation   . Bradycardia     s/p PPM  . Hypothyroidism   . Anxiety   . Diverticulitis     history of  . History of hysterectomy   . Long-term (current) use of anticoagulants   . Scoliosis (and kyphoscoliosis), idiopathic   . DVT (deep vein thrombosis) in pregnancy     right peroneal  . Dizziness and giddiness 07/22/2013   Past Surgical History  Procedure Laterality Date  . Pacemaker insertion  03/03/2006    most recent generator (MDT) change 10/30/06 by Dr Verlon Setting  . Cardiac catheterization  01/08/1999    normal LV function  . Cardioversion  10/30/2006    Successful elective DC cardioversion  . Cardioversion  12/29/2002    Successful DC cardioversion  . Cardioversion  12/03/1999    Successful DC cardioversion  . Cholecystectomy      Family History  Problem Relation Age of Onset  . Coronary artery disease Mother   . Cancer - Other Father     Pancreatic Cancer   History  Substance Use Topics  . Smoking status: Never Smoker   . Smokeless tobacco: Never Used  . Alcohol Use: No   OB History   Grav Para Term Preterm Abortions TAB SAB Ect Mult Living                 Review of Systems  All other systems reviewed and are negative.     Allergies  Review of patient's allergies indicates no known allergies.  Home Medications   Prior to Admission medications   Medication Sig Start Date End Date Taking? Authorizing Provider  ALPRAZolam (XANAX) 0.25 MG tablet Take 1 tablet (0.25 mg total) by mouth 3 (three) times daily as needed. For anxiety 04/05/14  Yes Peter M Martinique, MD  furosemide (LASIX) 20 MG tablet Take 1 tablet (20 mg total) by mouth daily as needed for fluid. Take 20 mg every other day 01/19/14  Yes Peter M Martinique, MD  levothyroxine (SYNTHROID, LEVOTHROID) 50 MCG tablet Take 1 tablet (50 mcg total) by mouth daily. 01/19/14  Yes Peter M Martinique, MD  metoprolol succinate (TOPROL-XL) 25 MG 24 hr tablet Take 3 tablets (75 mg total) by mouth 2 (two) times daily. 01/19/14  Yes Peter M Martinique, MD  warfarin (COUMADIN) 5 MG tablet Take 5 mg by mouth daily.   Yes Historical Provider, MD  Calcium Carbonate-Vitamin D (CALCIUM-VITAMIN D) 600-200 MG-UNIT CAPS Take 1 tablet by mouth daily as needed (for vitamin).     Historical Provider, MD  cholecalciferol (VITAMIN D) 1000 UNITS tablet Take 1,000 Units by mouth daily.    Historical Provider, MD  Multiple Vitamin (MULTIVITAMIN WITH MINERALS) TABS Take 1 tablet by mouth daily.    Historical Provider, MD   BP 117/54  Pulse 95  Temp(Src) 98.7 F (37.1 C) (Oral)  Resp 18  SpO2 95% Physical Exam  Nursing note and vitals reviewed. Constitutional: She appears well-developed and well-nourished. No distress.  HENT:  Head: Normocephalic. Head is with contusion.    Eyes:  Conjunctivae are normal.  Neck: Neck supple.  Cardiovascular: Normal rate and regular rhythm.   Pulmonary/Chest: Effort normal and breath sounds normal. No respiratory distress. She has no wheezes. She has no rales. She exhibits no tenderness.  Abdominal: Soft. She exhibits no distension. There is no tenderness. There is no rebound and no guarding.  Musculoskeletal:       Left hip: She exhibits decreased range of motion and tenderness.       Arms: Left hip pain with passive ROM.      No bony tenderness throughout with exception of Left hip and left shoulder.    Spine nontender, no crepitus, or stepoffs.    Neurological: She is alert. She has normal strength. No cranial nerve deficit or sensory deficit. GCS eye subscore is 4. GCS verbal subscore is 5. GCS motor subscore is 6.  Skin: She is not diaphoretic.  Psychiatric: She has a normal mood and affect. Her behavior is normal.    ED Course  Procedures (including critical care time) Labs Review Labs Reviewed  PROTIME-INR - Abnormal; Notable for the following:    Prothrombin Time 27.3 (*)    INR 2.64 (*)    All other components within normal limits  URINALYSIS, ROUTINE W REFLEX MICROSCOPIC    Imaging Review Dg Chest 2 View  05/21/2014   CLINICAL DATA:  Right lung opacity demonstrated in cervical spine CT.  EXAM: CHEST  2 VIEW  COMPARISON:  11/12/2013  FINDINGS: Cardiac pacemaker. Cardiac enlargement without vascular congestion. Emphysematous changes and scattered fibrosis in the lungs. There is chronic appearing elevation of the right hemidiaphragm. Linear opacity in the right mid lung probably represents either atelectasis or fluid in the minor fissure. Degenerative changes and demineralization of the thoracic spine. Calcified and tortuous aorta. Thoracolumbar scoliosis.  IMPRESSION: Chronic elevation of right hemidiaphragm with linear opacity in the right mid lung likely representing atelectasis or fluid in the minor fissure. Cardiac  enlargement.   Electronically Signed   By: Lucienne Capers M.D.   On: 05/21/2014 23:16   Dg Hip Complete Left  05/21/2014   CLINICAL DATA:  Fall. Left lateral shoulder tenderness and reduced range of motion. Left lateral hip pain.  EXAM: LEFT HIP - COMPLETE 2+ VIEW  COMPARISON:  None  FINDINGS: Bony demineralization. Loss of intervertebral disc height in the lower lumbar spine.  I do not observe a fracture of the left proximal femur. No regional pelvic fracture as identified.  IMPRESSION: 1. No fracture identified. If the patient is unable to bear weight or if otherwise indicated, cross-sectional imaging may be helpful for further workup. 2. Lower lumbar spondylosis and degenerative disc disease. 3. Bony demineralization.   Electronically Signed  By: Sherryl Barters M.D.   On: 05/21/2014 22:24   Ct Head Wo Contrast  05/21/2014   CLINICAL DATA:  Patient fell to ground from a standing position. Bruise to the left eye and cheek. On Coumadin.  EXAM: CT HEAD WITHOUT CONTRAST  CT MAXILLOFACIAL WITHOUT CONTRAST  CT CERVICAL SPINE WITHOUT CONTRAST  TECHNIQUE: Multidetector CT imaging of the head, cervical spine, and maxillofacial structures were performed using the standard protocol without intravenous contrast. Multiplanar CT image reconstructions of the cervical spine and maxillofacial structures were also generated.  COMPARISON:  CT head 11/28/2013.  CT angio head and neck 05/17/2013.  FINDINGS: CT HEAD FINDINGS  Diffuse cerebral atrophy. Minimal ventricular dilatation consistent with central atrophy. Low-attenuation changes in the deep white matter consistent with small vessel ischemia. No mass effect or midline shift. No abnormal extra-axial fluid collections. Gray-white matter junctions are distinct. Basal cisterns are not effaced. No evidence of acute intracranial hemorrhage. No depressed skull fractures. Mastoid air cells are not opacified.  CT MAXILLOFACIAL FINDINGS  Postoperative changes in the globes.  Mild left periorbital and facial subcutaneous soft tissues hematomas. Globes and extraocular muscles appear otherwise symmetrical and intact. Mucosal thickening in the right maxillary antrum. No acute air-fluid levels noted in the paranasal sinuses. The frontal bones, orbital rims, zygomatic arches, nasal bones, nasal septum, maxilla, mandibles, temporomandibular joints, and pterygoid plates appear intact. No displaced fractures identified.  CT CERVICAL SPINE FINDINGS  Diffuse bone demineralization. There is accentuation of the usual cervical lordosis likely associated with thoracic kyphosis. Diffuse degenerative changes throughout the cervical spine with narrowed interspaces and prominent endplate hypertrophic changes most prominently at C4-5, C5-6, and C6-7 levels. Degenerative changes throughout the facet joints and at C1-2. Prominent disc osteophyte complexes noted at C4-5, C5-6, and C6-7 levels. Degenerative changes with bridging anterior osteophytes causing near ankylosis of the visualized upper thoracic spine. No vertebral compression deformities. No prevertebral soft tissue swelling. Bone cortex and trabecular architecture appear intact. No focal bone lesion or bone destruction is appreciated.  Incidental note of rounded opacity in the right mid lung on the inferior most slice. Right lung pathology such as mass or infiltration cannot be excluded. Consider chest x-ray correlation.  IMPRESSION: No acute intracranial abnormalities. Chronic atrophy and small vessel ischemic changes.  Left periorbital and facial soft tissue swelling/hematoma. No displaced fractures identified in the facial bones or orbits.  Diffuse degenerative changes in the cervical spine. No displaced fractures identified.  Indeterminate opacity in the right lung. Chest x-ray correlation recommended.   Electronically Signed   By: Lucienne Capers M.D.   On: 05/21/2014 22:08   Ct Cervical Spine Wo Contrast  05/21/2014   CLINICAL DATA:   Patient fell to ground from a standing position. Bruise to the left eye and cheek. On Coumadin.  EXAM: CT HEAD WITHOUT CONTRAST  CT MAXILLOFACIAL WITHOUT CONTRAST  CT CERVICAL SPINE WITHOUT CONTRAST  TECHNIQUE: Multidetector CT imaging of the head, cervical spine, and maxillofacial structures were performed using the standard protocol without intravenous contrast. Multiplanar CT image reconstructions of the cervical spine and maxillofacial structures were also generated.  COMPARISON:  CT head 11/28/2013.  CT angio head and neck 05/17/2013.  FINDINGS: CT HEAD FINDINGS  Diffuse cerebral atrophy. Minimal ventricular dilatation consistent with central atrophy. Low-attenuation changes in the deep white matter consistent with small vessel ischemia. No mass effect or midline shift. No abnormal extra-axial fluid collections. Gray-white matter junctions are distinct. Basal cisterns are not effaced. No evidence of acute intracranial hemorrhage. No  depressed skull fractures. Mastoid air cells are not opacified.  CT MAXILLOFACIAL FINDINGS  Postoperative changes in the globes. Mild left periorbital and facial subcutaneous soft tissues hematomas. Globes and extraocular muscles appear otherwise symmetrical and intact. Mucosal thickening in the right maxillary antrum. No acute air-fluid levels noted in the paranasal sinuses. The frontal bones, orbital rims, zygomatic arches, nasal bones, nasal septum, maxilla, mandibles, temporomandibular joints, and pterygoid plates appear intact. No displaced fractures identified.  CT CERVICAL SPINE FINDINGS  Diffuse bone demineralization. There is accentuation of the usual cervical lordosis likely associated with thoracic kyphosis. Diffuse degenerative changes throughout the cervical spine with narrowed interspaces and prominent endplate hypertrophic changes most prominently at C4-5, C5-6, and C6-7 levels. Degenerative changes throughout the facet joints and at C1-2. Prominent disc osteophyte  complexes noted at C4-5, C5-6, and C6-7 levels. Degenerative changes with bridging anterior osteophytes causing near ankylosis of the visualized upper thoracic spine. No vertebral compression deformities. No prevertebral soft tissue swelling. Bone cortex and trabecular architecture appear intact. No focal bone lesion or bone destruction is appreciated.  Incidental note of rounded opacity in the right mid lung on the inferior most slice. Right lung pathology such as mass or infiltration cannot be excluded. Consider chest x-ray correlation.  IMPRESSION: No acute intracranial abnormalities. Chronic atrophy and small vessel ischemic changes.  Left periorbital and facial soft tissue swelling/hematoma. No displaced fractures identified in the facial bones or orbits.  Diffuse degenerative changes in the cervical spine. No displaced fractures identified.  Indeterminate opacity in the right lung. Chest x-ray correlation recommended.   Electronically Signed   By: Lucienne Capers M.D.   On: 05/21/2014 22:08   Ct Hip Left Wo Contrast  05/22/2014   CLINICAL DATA:  Patient fell to ground from a standing position. Pain.  EXAM: CT OF THE LEFT HIP WITHOUT CONTRAST  TECHNIQUE: Multidetector CT imaging was performed according to the standard protocol. Multiplanar CT image reconstructions were also generated.  COMPARISON:  Left hip 05/21/2014  FINDINGS: Prominent diffuse bone demineralization. Mild degenerative changes in the left hip. Left hip appears intact. No displaced fractures are identified. There is focal cortical irregularity in the superior left pubic rami as at the juncture point to the anterior acetabulum. This suggests a nondisplaced fracture. Inferior pubic ramus appears intact. No displacement of the symphysis pubis. No significant soft tissue hematoma or E fusion.  IMPRESSION: Cortical irregularity suggesting nondisplaced fracture of the superior left pubic ramus. Left hip appears intact without displaced fracture  identified. Degenerative change in the left hip. Diffuse bone demineralization.   Electronically Signed   By: Lucienne Capers M.D.   On: 05/22/2014 00:15   Dg Shoulder Left  05/21/2014   CLINICAL DATA:  Left lateral shoulder pain with limited range of motion after a fall tonight.  EXAM: LEFT SHOULDER - 2+ VIEW  COMPARISON:  None.  FINDINGS: Diffuse bone demineralization. There is no evidence of fracture or dislocation. There is no evidence of arthropathy or other focal bone abnormality. Soft tissues are unremarkable. Degenerative changes in the spine.  IMPRESSION: Negative.   Electronically Signed   By: Lucienne Capers M.D.   On: 05/21/2014 22:21   Ct Maxillofacial Wo Cm  05/21/2014   CLINICAL DATA:  Patient fell to ground from a standing position. Bruise to the left eye and cheek. On Coumadin.  EXAM: CT HEAD WITHOUT CONTRAST  CT MAXILLOFACIAL WITHOUT CONTRAST  CT CERVICAL SPINE WITHOUT CONTRAST  TECHNIQUE: Multidetector CT imaging of the head, cervical spine, and  maxillofacial structures were performed using the standard protocol without intravenous contrast. Multiplanar CT image reconstructions of the cervical spine and maxillofacial structures were also generated.  COMPARISON:  CT head 11/28/2013.  CT angio head and neck 05/17/2013.  FINDINGS: CT HEAD FINDINGS  Diffuse cerebral atrophy. Minimal ventricular dilatation consistent with central atrophy. Low-attenuation changes in the deep white matter consistent with small vessel ischemia. No mass effect or midline shift. No abnormal extra-axial fluid collections. Gray-white matter junctions are distinct. Basal cisterns are not effaced. No evidence of acute intracranial hemorrhage. No depressed skull fractures. Mastoid air cells are not opacified.  CT MAXILLOFACIAL FINDINGS  Postoperative changes in the globes. Mild left periorbital and facial subcutaneous soft tissues hematomas. Globes and extraocular muscles appear otherwise symmetrical and intact. Mucosal  thickening in the right maxillary antrum. No acute air-fluid levels noted in the paranasal sinuses. The frontal bones, orbital rims, zygomatic arches, nasal bones, nasal septum, maxilla, mandibles, temporomandibular joints, and pterygoid plates appear intact. No displaced fractures identified.  CT CERVICAL SPINE FINDINGS  Diffuse bone demineralization. There is accentuation of the usual cervical lordosis likely associated with thoracic kyphosis. Diffuse degenerative changes throughout the cervical spine with narrowed interspaces and prominent endplate hypertrophic changes most prominently at C4-5, C5-6, and C6-7 levels. Degenerative changes throughout the facet joints and at C1-2. Prominent disc osteophyte complexes noted at C4-5, C5-6, and C6-7 levels. Degenerative changes with bridging anterior osteophytes causing near ankylosis of the visualized upper thoracic spine. No vertebral compression deformities. No prevertebral soft tissue swelling. Bone cortex and trabecular architecture appear intact. No focal bone lesion or bone destruction is appreciated.  Incidental note of rounded opacity in the right mid lung on the inferior most slice. Right lung pathology such as mass or infiltration cannot be excluded. Consider chest x-ray correlation.  IMPRESSION: No acute intracranial abnormalities. Chronic atrophy and small vessel ischemic changes.  Left periorbital and facial soft tissue swelling/hematoma. No displaced fractures identified in the facial bones or orbits.  Diffuse degenerative changes in the cervical spine. No displaced fractures identified.  Indeterminate opacity in the right lung. Chest x-ray correlation recommended.   Electronically Signed   By: Lucienne Capers M.D.   On: 05/21/2014 22:08     EKG Interpretation None      MDM   Final diagnoses:  Fall, initial encounter  Pubic ramus fracture, left, closed, initial encounter    Pt with mechanical fall due to using backless shoes that tripped  her, found to have pubic ramus fracture.  Pt unable to bear weight on left leg in ED.  Declines pain medication because she does not have pain if in bed.  Plan is for admission for pain control, physical therapy assessment.  Discussed pt with Junius Creamer, NP, who assumes care at change of shift and will admit when labs have resulted.     Clayton Bibles, PA-C 05/22/14 0116

## 2014-05-21 NOTE — ED Notes (Addendum)
Pt reports she has a sore on her Left foot so she wore shoes today with no backs to them, while walking on the pavement her shoe got caught and would not move causing pt to loose her balance and fall from a standing position to concrete. Pt denies dizziness prior to the fall, but does endorse dizziness and "foggy vision" approx 30 mins after the fall. Pt denies nausea or vomiting. Pt does report pain to her Left lateral shoulder, Left face, and Left hip, increase pain with movement. Pt alert and oriented x4, NAD.  Pt reports after applying several cool rags to her neck and forehead the dizziness subsided

## 2014-05-21 NOTE — ED Notes (Signed)
Pt has returned from xray

## 2014-05-21 NOTE — ED Notes (Signed)
Tried to ambulate patient. Patient stated she was in considerable pain when trying to move her left leg to the edge of the bed. Unable to ambulate.

## 2014-05-21 NOTE — ED Notes (Signed)
MD at bedside. 

## 2014-05-21 NOTE — ED Notes (Addendum)
Per [facility] pt from home, pt states her shoe caught on the concrete and she fell to the ground from a standing position. Pt presents with a bruise to her Left eye and cheek. Pt fell approx 1830 this pm

## 2014-05-21 NOTE — ED Notes (Signed)
PA student at bedside.

## 2014-05-22 ENCOUNTER — Encounter (HOSPITAL_COMMUNITY): Payer: Self-pay | Admitting: Internal Medicine

## 2014-05-22 DIAGNOSIS — S0510XA Contusion of eyeball and orbital tissues, unspecified eye, initial encounter: Secondary | ICD-10-CM | POA: Diagnosis present

## 2014-05-22 DIAGNOSIS — S1093XA Contusion of unspecified part of neck, initial encounter: Secondary | ICD-10-CM | POA: Diagnosis present

## 2014-05-22 DIAGNOSIS — W19XXXA Unspecified fall, initial encounter: Secondary | ICD-10-CM | POA: Diagnosis not present

## 2014-05-22 DIAGNOSIS — S72009D Fracture of unspecified part of neck of unspecified femur, subsequent encounter for closed fracture with routine healing: Secondary | ICD-10-CM | POA: Diagnosis not present

## 2014-05-22 DIAGNOSIS — Z950 Presence of cardiac pacemaker: Secondary | ICD-10-CM | POA: Diagnosis not present

## 2014-05-22 DIAGNOSIS — I82819 Embolism and thrombosis of superficial veins of unspecified lower extremities: Secondary | ICD-10-CM | POA: Diagnosis not present

## 2014-05-22 DIAGNOSIS — Z7901 Long term (current) use of anticoagulants: Secondary | ICD-10-CM | POA: Diagnosis not present

## 2014-05-22 DIAGNOSIS — S32509A Unspecified fracture of unspecified pubis, initial encounter for closed fracture: Secondary | ICD-10-CM | POA: Diagnosis not present

## 2014-05-22 DIAGNOSIS — Z8249 Family history of ischemic heart disease and other diseases of the circulatory system: Secondary | ICD-10-CM | POA: Diagnosis not present

## 2014-05-22 DIAGNOSIS — D696 Thrombocytopenia, unspecified: Secondary | ICD-10-CM | POA: Diagnosis not present

## 2014-05-22 DIAGNOSIS — Z86718 Personal history of other venous thrombosis and embolism: Secondary | ICD-10-CM | POA: Diagnosis not present

## 2014-05-22 DIAGNOSIS — D61818 Other pancytopenia: Secondary | ICD-10-CM | POA: Diagnosis present

## 2014-05-22 DIAGNOSIS — E039 Hypothyroidism, unspecified: Secondary | ICD-10-CM | POA: Diagnosis present

## 2014-05-22 DIAGNOSIS — Z800 Family history of malignant neoplasm of digestive organs: Secondary | ICD-10-CM | POA: Diagnosis not present

## 2014-05-22 DIAGNOSIS — S32599A Other specified fracture of unspecified pubis, initial encounter for closed fracture: Secondary | ICD-10-CM

## 2014-05-22 DIAGNOSIS — W010XXA Fall on same level from slipping, tripping and stumbling without subsequent striking against object, initial encounter: Secondary | ICD-10-CM | POA: Diagnosis present

## 2014-05-22 DIAGNOSIS — R21 Rash and other nonspecific skin eruption: Secondary | ICD-10-CM | POA: Diagnosis not present

## 2014-05-22 DIAGNOSIS — Y92009 Unspecified place in unspecified non-institutional (private) residence as the place of occurrence of the external cause: Secondary | ICD-10-CM | POA: Diagnosis not present

## 2014-05-22 DIAGNOSIS — I824Z9 Acute embolism and thrombosis of unspecified deep veins of unspecified distal lower extremity: Secondary | ICD-10-CM | POA: Diagnosis not present

## 2014-05-22 DIAGNOSIS — R269 Unspecified abnormalities of gait and mobility: Secondary | ICD-10-CM | POA: Diagnosis not present

## 2014-05-22 DIAGNOSIS — S0083XA Contusion of other part of head, initial encounter: Secondary | ICD-10-CM | POA: Diagnosis present

## 2014-05-22 DIAGNOSIS — F411 Generalized anxiety disorder: Secondary | ICD-10-CM | POA: Diagnosis present

## 2014-05-22 DIAGNOSIS — M4120 Other idiopathic scoliosis, site unspecified: Secondary | ICD-10-CM | POA: Diagnosis present

## 2014-05-22 DIAGNOSIS — I4891 Unspecified atrial fibrillation: Secondary | ICD-10-CM | POA: Diagnosis not present

## 2014-05-22 DIAGNOSIS — S0003XA Contusion of scalp, initial encounter: Secondary | ICD-10-CM | POA: Diagnosis not present

## 2014-05-22 DIAGNOSIS — I10 Essential (primary) hypertension: Secondary | ICD-10-CM | POA: Diagnosis not present

## 2014-05-22 LAB — CBC WITH DIFFERENTIAL/PLATELET
BASOS ABS: 0 10*3/uL (ref 0.0–0.1)
Basophils Relative: 0 % (ref 0–1)
EOS ABS: 0 10*3/uL (ref 0.0–0.7)
Eosinophils Relative: 0 % (ref 0–5)
HCT: 34.2 % — ABNORMAL LOW (ref 36.0–46.0)
HEMOGLOBIN: 11.4 g/dL — AB (ref 12.0–15.0)
Lymphocytes Relative: 10 % — ABNORMAL LOW (ref 12–46)
Lymphs Abs: 0.5 10*3/uL — ABNORMAL LOW (ref 0.7–4.0)
MCH: 31 pg (ref 26.0–34.0)
MCHC: 33.3 g/dL (ref 30.0–36.0)
MCV: 92.9 fL (ref 78.0–100.0)
MONOS PCT: 7 % (ref 3–12)
Monocytes Absolute: 0.3 10*3/uL (ref 0.1–1.0)
NEUTROS ABS: 3.8 10*3/uL (ref 1.7–7.7)
Neutrophils Relative %: 83 % — ABNORMAL HIGH (ref 43–77)
PLATELETS: 102 10*3/uL — AB (ref 150–400)
RBC: 3.68 MIL/uL — ABNORMAL LOW (ref 3.87–5.11)
RDW: 15.4 % (ref 11.5–15.5)
WBC: 4.5 10*3/uL (ref 4.0–10.5)

## 2014-05-22 LAB — I-STAT CHEM 8, ED
BUN: 17 mg/dL (ref 6–23)
CHLORIDE: 98 meq/L (ref 96–112)
Calcium, Ion: 1.19 mmol/L (ref 1.13–1.30)
Creatinine, Ser: 0.6 mg/dL (ref 0.50–1.10)
GLUCOSE: 125 mg/dL — AB (ref 70–99)
HEMATOCRIT: 37 % (ref 36.0–46.0)
HEMOGLOBIN: 12.6 g/dL (ref 12.0–15.0)
POTASSIUM: 3.7 meq/L (ref 3.7–5.3)
SODIUM: 138 meq/L (ref 137–147)
TCO2: 24 mmol/L (ref 0–100)

## 2014-05-22 LAB — URINALYSIS, ROUTINE W REFLEX MICROSCOPIC
Bilirubin Urine: NEGATIVE
GLUCOSE, UA: NEGATIVE mg/dL
Hgb urine dipstick: NEGATIVE
Ketones, ur: NEGATIVE mg/dL
LEUKOCYTES UA: NEGATIVE
NITRITE: NEGATIVE
PH: 7.5 (ref 5.0–8.0)
Protein, ur: NEGATIVE mg/dL
SPECIFIC GRAVITY, URINE: 1.013 (ref 1.005–1.030)
Urobilinogen, UA: 1 mg/dL (ref 0.0–1.0)

## 2014-05-22 LAB — CBC
HEMATOCRIT: 34 % — AB (ref 36.0–46.0)
HEMOGLOBIN: 10.9 g/dL — AB (ref 12.0–15.0)
MCH: 30.1 pg (ref 26.0–34.0)
MCHC: 32.1 g/dL (ref 30.0–36.0)
MCV: 93.9 fL (ref 78.0–100.0)
Platelets: 105 10*3/uL — ABNORMAL LOW (ref 150–400)
RBC: 3.62 MIL/uL — ABNORMAL LOW (ref 3.87–5.11)
RDW: 15.6 % — ABNORMAL HIGH (ref 11.5–15.5)
WBC: 4.3 10*3/uL (ref 4.0–10.5)

## 2014-05-22 LAB — BASIC METABOLIC PANEL
BUN: 17 mg/dL (ref 6–23)
CO2: 27 meq/L (ref 19–32)
Calcium: 8.8 mg/dL (ref 8.4–10.5)
Chloride: 99 mEq/L (ref 96–112)
Creatinine, Ser: 0.59 mg/dL (ref 0.50–1.10)
GFR calc non Af Amer: 78 mL/min — ABNORMAL LOW (ref >90)
Glucose, Bld: 170 mg/dL — ABNORMAL HIGH (ref 70–99)
POTASSIUM: 3.7 meq/L (ref 3.7–5.3)
Sodium: 137 mEq/L (ref 137–147)

## 2014-05-22 MED ORDER — ADULT MULTIVITAMIN W/MINERALS CH
1.0000 | ORAL_TABLET | Freq: Every day | ORAL | Status: DC
  Administered 2014-05-22 – 2014-05-24 (×3): 1 via ORAL
  Filled 2014-05-22 (×4): qty 1

## 2014-05-22 MED ORDER — TRAMADOL HCL 50 MG PO TABS
25.0000 mg | ORAL_TABLET | Freq: Four times a day (QID) | ORAL | Status: DC | PRN
  Administered 2014-05-22 (×2): 25 mg via ORAL
  Filled 2014-05-22 (×2): qty 1

## 2014-05-22 MED ORDER — VITAMIN D3 25 MCG (1000 UNIT) PO TABS
1000.0000 [IU] | ORAL_TABLET | Freq: Every day | ORAL | Status: DC
  Administered 2014-05-22 – 2014-05-24 (×3): 1000 [IU] via ORAL
  Filled 2014-05-22 (×4): qty 1

## 2014-05-22 MED ORDER — COUMADIN BOOK
1.0000 | Freq: Once | Status: AC
  Administered 2014-05-22: 1
  Filled 2014-05-22: qty 1

## 2014-05-22 MED ORDER — WARFARIN SODIUM 7.5 MG PO TABS
7.5000 mg | ORAL_TABLET | ORAL | Status: DC
  Filled 2014-05-22: qty 1

## 2014-05-22 MED ORDER — WARFARIN VIDEO
1.0000 | Freq: Once | Status: AC
  Administered 2014-05-23: 1

## 2014-05-22 MED ORDER — METOPROLOL SUCCINATE ER 50 MG PO TB24
50.0000 mg | ORAL_TABLET | Freq: Two times a day (BID) | ORAL | Status: DC
  Administered 2014-05-23 – 2014-05-25 (×5): 50 mg via ORAL
  Filled 2014-05-22 (×7): qty 1

## 2014-05-22 MED ORDER — ACETAMINOPHEN 650 MG RE SUPP: 650.0000 mg | Freq: Four times a day (QID) | RECTAL | Status: DC | PRN

## 2014-05-22 MED ORDER — FUROSEMIDE 20 MG PO TABS
20.0000 mg | ORAL_TABLET | Freq: Every day | ORAL | Status: DC
  Administered 2014-05-23 – 2014-05-24 (×2): 20 mg via ORAL
  Filled 2014-05-22 (×4): qty 1

## 2014-05-22 MED ORDER — ACETAMINOPHEN 325 MG PO TABS
650.0000 mg | ORAL_TABLET | Freq: Four times a day (QID) | ORAL | Status: DC | PRN
  Administered 2014-05-23 – 2014-05-24 (×2): 650 mg via ORAL
  Filled 2014-05-22: qty 2

## 2014-05-22 MED ORDER — ONDANSETRON HCL 4 MG/2ML IJ SOLN
4.0000 mg | Freq: Four times a day (QID) | INTRAMUSCULAR | Status: DC | PRN
Start: 2014-05-22 — End: 2014-05-25

## 2014-05-22 MED ORDER — WARFARIN SODIUM 7.5 MG PO TABS: 7.5000 mg | ORAL_TABLET | ORAL | Status: DC

## 2014-05-22 MED ORDER — WARFARIN SODIUM 5 MG PO TABS
5.0000 mg | ORAL_TABLET | ORAL | Status: DC
  Administered 2014-05-22: 5 mg via ORAL
  Filled 2014-05-22: qty 1

## 2014-05-22 MED ORDER — ALPRAZOLAM 0.25 MG PO TABS
0.2500 mg | ORAL_TABLET | Freq: Three times a day (TID) | ORAL | Status: DC | PRN
  Administered 2014-05-22 – 2014-05-25 (×4): 0.25 mg via ORAL
  Filled 2014-05-22 (×5): qty 1

## 2014-05-22 MED ORDER — WARFARIN - PHYSICIAN DOSING INPATIENT: Freq: Every day | Status: DC

## 2014-05-22 MED ORDER — LEVOTHYROXINE SODIUM 50 MCG PO TABS
50.0000 ug | ORAL_TABLET | Freq: Every day | ORAL | Status: DC
  Administered 2014-05-22 – 2014-05-25 (×4): 50 ug via ORAL
  Filled 2014-05-22 (×6): qty 1

## 2014-05-22 MED ORDER — SODIUM CHLORIDE 0.9 % IJ SOLN
3.0000 mL | Freq: Two times a day (BID) | INTRAMUSCULAR | Status: DC
  Administered 2014-05-22 – 2014-05-24 (×7): 3 mL via INTRAVENOUS

## 2014-05-22 MED ORDER — WARFARIN - PHARMACIST DOSING INPATIENT: Freq: Every day | Status: DC

## 2014-05-22 MED ORDER — ONDANSETRON HCL 4 MG PO TABS: 4.0000 mg | ORAL_TABLET | Freq: Four times a day (QID) | ORAL | Status: DC | PRN

## 2014-05-22 MED ORDER — METOPROLOL SUCCINATE ER 50 MG PO TB24
75.0000 mg | ORAL_TABLET | Freq: Two times a day (BID) | ORAL | Status: DC
  Administered 2014-05-22 (×2): 75 mg via ORAL
  Filled 2014-05-22 (×3): qty 1

## 2014-05-22 MED ORDER — WARFARIN SODIUM 5 MG PO TABS
5.0000 mg | ORAL_TABLET | ORAL | Status: DC
  Filled 2014-05-22: qty 1

## 2014-05-22 NOTE — Evaluation (Signed)
Physical Therapy Evaluation Patient Details Name: Christy Miranda MRN: [id number] DOB: Jan 04, 1921 Today's Date: 05/22/2014   History of Present Illness  Pt. fell and sustained superior L pubic rami fx. Pt. PMH: Afib, DVT, pacemaker, HTN, anxiety, scoliosis, hyphoscoliosis.   Clinical Impression  Pt admitted after a fall resulting in superior Lt pubic rami fx. Pt currently with functional limitations due to the deficits listed below (see PT Problem List). Able to ambulate up to 25 feet with close min guard for safety, moderate balance deficits upon standing requiring min assist to correct. Pt with lightheadedness towards end of therapy unresolved by sitting, nurse notified and checking BP towards end of therapy session (see vitals in flowsheets @ approx 1410.) Pt will benefit from skilled PT to increase their independence and safety with mobility to allow discharge to the venue listed below.       Follow Up Recommendations SNF    Equipment Recommendations  Rolling walker with 5" wheels;3in1 (PT)    Recommendations for Other Services       Precautions / Restrictions Precautions Precautions: Fall Restrictions Other Position/Activity Restrictions:  (Assume WBAT per chart does not specify.)      Mobility  Bed Mobility Overal bed mobility: Needs Assistance Bed Mobility: Supine to Sit     Supine to sit: Min guard;HOB elevated     General bed mobility comments: Min guard for safety with HOB elevated minimally. VCs for technique. Pt requires extra time but no physical assist.  Transfers Overall transfer level: Needs assistance Equipment used: Rolling walker (2 wheeled) Transfers: Sit to/from Stand Sit to Stand: Min assist         General transfer comment: Min assist to steady RW and for LOB to posterior. Uses back of legs on bed to correct her balance. VCs to lean anteriorly over RW.   Ambulation/Gait Ambulation/Gait assistance: Min guard Ambulation Distance (Feet): 25  Feet Assistive device: Rolling walker (2 wheeled) Gait Pattern/deviations: Step-to pattern;Step-through pattern;Decreased stride length   Gait velocity interpretation: Below normal speed for age/gender General Gait Details: Educated on safe use of DME. VCs for sequencing and walker placement. close guard for safety. No instances of loss of balance.  Stairs            Wheelchair Mobility    Modified Rankin (Stroke Patients Only)       Balance Overall balance assessment: Needs assistance;History of Falls Sitting-balance support: No upper extremity supported;Feet supported Sitting balance-Leahy Scale: Fair   Postural control: Posterior lean Standing balance support: Bilateral upper extremity supported Standing balance-Leahy Scale: Poor                               Pertinent Vitals/Pain Pt reports pain as "okay" at rest Complains of unresolving lightheadedness  Nurse notified and checking vitals at end of therapy Patient repositioned in chair for comfort.     Home Living Family/patient expects to be discharged to:: Skilled nursing facility Living Arrangements: Alone Available Help at Discharge: [place] Type of Home: House Home Access: Stairs to enter Entrance Stairs-Rails: None Entrance Stairs-Number of Steps: 3 Home Layout: One level Home Equipment: Shower seat Additional Comments: Pt. wants home but willing to go to SNF prior.    Prior Function Level of Independence: Independent               Hand Dominance   Dominant Hand: Right    Extremity/Trunk Assessment   Upper Extremity Assessment: Defer  to OT evaluation           Lower Extremity Assessment: Generalized weakness      Cervical / Trunk Assessment: Kyphotic  Communication   Communication: No difficulties  Cognition Arousal/Alertness: Awake/alert Behavior During Therapy: WFL for tasks assessed/performed Overall Cognitive Status: Within Functional Limits  for tasks assessed                      General Comments General comments (skin integrity, edema, etc.): Pt complains of being "lightheaded," after ambulating with therapy. Nurse notified and checking BP at end of therapy session.    Exercises General Exercises - Lower Extremity Ankle Circles/Pumps: AROM;Both;10 reps;Supine Gluteal Sets: Strengthening;Both;5 reps;Seated      Assessment/Plan    PT Assessment Patient needs continued PT services  PT Diagnosis Difficulty walking;Abnormality of gait;Acute pain;Generalized weakness   PT Problem List Decreased strength;Decreased range of motion;Decreased activity tolerance;Decreased balance;Decreased mobility;Decreased knowledge of use of DME;Pain  PT Treatment Interventions DME instruction;Gait training;Functional mobility training;Therapeutic activities;Therapeutic exercise;Balance training;Neuromuscular re-education;Patient/family education;Modalities   PT Goals (Current goals can be found in the Care Plan section) Acute Rehab PT Goals Patient Stated Goal: Get better so I can go home PT Goal Formulation: With patient Time For Goal Achievement: 05/29/14 Potential to Achieve Goals: Good    Frequency Min 3X/week   Barriers to discharge Decreased caregiver support Pt lives alone    Co-evaluation               End of Session   Activity Tolerance: Patient tolerated treatment well Patient left: in chair;with call bell/phone within reach;with nursing/sitter in room Nurse Communication: Mobility status;Other (comment) ("lightheadedness")         Time: 7544-9201 PT Time Calculation (min): 34 min   Charges:   PT Evaluation $Initial PT Evaluation Tier I: 1 Procedure PT Treatments $Gait Training: 8-22 mins $Therapeutic Activity: 8-22 mins   PT G Codes:         Elayne Snare, [place]  [name] 05/22/2014, 2:25 PM

## 2014-05-22 NOTE — Progress Notes (Signed)
PT Cancellation Note  Patient Details Name: Christy Miranda MRN: [id number] DOB: Dec 26, 1920   Cancelled Treatment:    Reason Eval/Treat Not Completed: Fatigue/lethargy limiting ability to participate  Attempted to see patient for evaluation this AM. Pt states she has not slept all night as she was "transfered to this unit at 4 in the morning." She requests PT eval be held until this afternoon so that she can rest first. Will follow up as time allows.   [name], [name]  [name] 05/22/2014, 10:56 AM

## 2014-05-22 NOTE — ED Provider Notes (Signed)
Patient was found to have pubic rami fracture, and left facial hematoma patient is on Coumadin.  She is therapeutic at 2.64.  She will be admitted for pain management and placed in a rehabilitation facility  Garald Balding, NP 05/22/14 [phone]

## 2014-05-22 NOTE — Clinical Social Work Note (Signed)
Rec'd referral today for ?SNF- full assessment to follow tomorrow. Eduard Clos, MSW, [place]

## 2014-05-22 NOTE — ED Notes (Signed)
Transporting patient to new room assignment. 

## 2014-05-22 NOTE — Evaluation (Signed)
Occupational Therapy Evaluation Patient Details Name: Christy Miranda MRN: [id number] DOB: 1921-05-25 Today's Date: 05/22/2014    History of Present Illness Pt. fell and sustained wuperior L pubic rami fx. Pt. PMH: Afib, DVT, pacemaker, HTN, anxiety, scoliosis, hyphoscoliosis.    Clinical Impression   Pt. Very pleasant during eval and participated to best of her ability. Pt. Would benefit from further OT to maximize performance with ADLs and mobility. Pt. Would benefit from AE ed. To increase I with ADLs.     Follow Up Recommendations  SNF    Equipment Recommendations  3 in 1 bedside comode    Recommendations for Other Services       Precautions / Restrictions Precautions Precautions: Fall Restrictions Other Position/Activity Restrictions:  (Assume WBAT per chart does not specify.)      Mobility Bed Mobility                  Transfers                      Balance                                            ADL Overall ADL's : Needs assistance/impaired Eating/Feeding: Independent;Sitting   Grooming: Wash/dry hands;Wash/dry face;Brushing hair;Set up   Upper Body Bathing: Set up;Sitting   Lower Body Bathing: Maximal assistance;Sit to/from stand   Upper Body Dressing : Set up;Sitting   Lower Body Dressing: Total assistance;Sit to/from stand               Functional mobility during ADLs:  (pt. deferred OOB secondary to pain.) General ADL Comments:  (Pt. would benefit from AE training for LE ADLs.)     Vision                     Perception     Praxis      Pertinent Vitals/Pain 9/10 with mobility and nothing at rest.      Hand Dominance Right   Extremity/Trunk Assessment Upper Extremity Assessment Upper Extremity Assessment: Generalized weakness           Communication Communication Communication: No difficulties   Cognition Arousal/Alertness: Awake/alert Behavior During Therapy: WFL for tasks  assessed/performed                       General Comments       Exercises       Shoulder Instructions      Home Living Family/patient expects to be discharged to:: Skilled nursing facility Living Arrangements: Alone                               Additional Comments: Pt. wants home but willing to go to SNF prior.      Prior Functioning/Environment Level of Independence: Independent             OT Diagnosis: Generalized weakness   OT Problem List: Decreased strength;Decreased activity tolerance;Decreased knowledge of use of DME or AE   OT Treatment/Interventions: Self-care/ADL training;DME and/or AE instruction;Therapeutic activities    OT Goals(Current goals can be found in the care plan section) Acute Rehab OT Goals Patient Stated Goal:  (go home) OT Goal Formulation: With patient Time For Goal Achievement: 06/05/14 Potential to Achieve Goals: Good ADL Goals Pt  Will Perform Lower Body Bathing: with min assist;with adaptive equipment;sit to/from stand Pt Will Perform Lower Body Dressing: with min assist;with adaptive equipment;sit to/from stand Pt Will Transfer to Toilet: with supervision;bedside commode Pt Will Perform Toileting - Clothing Manipulation and hygiene: with supervision;sit to/from stand  OT Frequency: Min 2X/week   Barriers to D/C: Decreased caregiver support          Co-evaluation              End of Session    Activity Tolerance: Patient tolerated treatment well Patient left: in bed   Time: 1135-1235 OT Time Calculation (min): 60 min Charges:  OT General Charges $OT Visit: 1 Procedure OT Evaluation $Initial OT Evaluation Tier I: 1 Procedure OT Treatments $Self Care/Home Management : 38-52 mins G-Codes:    HORVATH,STEPHANIE 06/13/14, 12:36 PM

## 2014-05-22 NOTE — ED Notes (Signed)
Admitting Dr. At Bedside.

## 2014-05-22 NOTE — Consult Note (Signed)
PHARMACY CONSULT NOTE  Pharmacy Consult :  Coumadin Indication : Permanent atrial fibrillation   Allergies: No Known Allergies  Dosing weight : 44.8 kg  Vital Signs: BP 136/59  Pulse 93  Temp(Src) 99 F (37.2 C) (Oral)  Resp 20  Wt 98 lb 12.3 oz (44.8 kg)  SpO2 96%  Active Problems: Principal Problem:   Fracture of multiple pubic rami Active Problems:   Atrial fibrillation   Hypertension   Deep venous thrombosis of lower leg   Labs:  Recent Labs  05/21/14 2118 05/22/14 0035 05/22/14 0118  HGB  --  11.4* 12.6  HCT  --  34.2* 37.0  PLT  --  102*  --   LABPROT 27.3*  --   --   INR 2.64*  --   --   CREATININE  --   --  0.60   Lab Results  Component Value Date   INR 2.64* 05/21/2014   INR 2.7 05/04/2014   INR 2.9 04/07/2014    Medical / Surgical History: Past Medical History  Diagnosis Date  . HTN (hypertension)   . Permanent atrial fibrillation   . Bradycardia     s/p PPM  . Hypothyroidism   . Anxiety   . Diverticulitis     history of  . History of hysterectomy   . Long-term (current) use of anticoagulants   . Scoliosis (and kyphoscoliosis), idiopathic   . DVT (deep vein thrombosis) in pregnancy     right peroneal  . Dizziness and giddiness 07/22/2013   Past Surgical History  Procedure Laterality Date  . Pacemaker insertion  03/03/2006    most recent generator (MDT) change 10/30/06 by Dr Verlon Setting  . Cardiac catheterization  01/08/1999    normal LV function  . Cardioversion  10/30/2006    Successful elective DC cardioversion  . Cardioversion  12/29/2002    Successful DC cardioversion  . Cardioversion  12/03/1999    Successful DC cardioversion  . Cholecystectomy      Current Medication[s] Include: Medication PTA: Prescriptions prior to admission  Medication Sig Dispense Refill  . ALPRAZolam (XANAX) 0.25 MG tablet Take 1 tablet (0.25 mg total) by mouth 3 (three) times daily as needed. For anxiety  30 tablet  0  . cholecalciferol (VITAMIN D) 1000  UNITS tablet Take 1,000 Units by mouth daily.      . furosemide (LASIX) 20 MG tablet Take 20 mg by mouth daily.      [name] levothyroxine (SYNTHROID, LEVOTHROID) 50 MCG tablet Take 1 tablet (50 mcg total) by mouth daily.  90 tablet  3  . metoprolol succinate (TOPROL-XL) 25 MG 24 hr tablet Take 3 tablets (75 mg total) by mouth 2 (two) times daily.  540 tablet  3  . Multiple Vitamin (MULTIVITAMIN WITH MINERALS) TABS Take 1 tablet by mouth daily.      [name] warfarin (COUMADIN) 5 MG tablet Take 5-7.5 mg by mouth See admin instructions. Take 1 tablet (5mg ) Monday and Friday. Take 1.5 tablets (7.5mg ) all other days.        Scheduled:  Scheduled:  . cholecalciferol  1,000 Units Oral Daily  . furosemide  20 mg Oral Daily  . levothyroxine  50 mcg Oral QAC breakfast  . metoprolol succinate  75 mg Oral BID  . multivitamin with minerals  1 tablet Oral Daily  . sodium chloride  3 mL Intravenous Q12H   Infusion[s]: Infusions:  None Antibiotic[s]: Anti-infectives   None      Assessment:  80 y.o.female admitted  following a Fall.  CT scan of head negative for any acute hemorrhage.  She has been found with a Pubic ramus fracture   She is on Chronic Coumadin for a history of permanent atrial fibrillation.  Her Home dose of Coumadin is 7.5 mg daily except 5 mg on Mon, Fri. Baseline INR 2.64.  Hgb 12.6,  Plt 102.    Goal of Therapy: INR goal is 2-3  Plan:  1. Continue home schedule of Coumadin 7.5 mg daily except 5 mg on Mon, Fri. 2. Begin Coumadin discharge education. Daily INR's, Platelet count, CBC.  Monitor for bleeding complications.   Stramoski, Craig Guess,  Pharm.D.. 05/22/2014,  4:00 AM

## 2014-05-22 NOTE — H&P (Addendum)
[facility] History and Physical  [name] ENI:[id number] DOB: November 16, 1921 DOA: 05/21/2014  Referring physician: ER physician. PCP: [name], MD   Chief Complaint: Fall with left hip pain.  HPI: Christy Miranda is a 80 y.o. female with history of atrial fibrillation and DVT on Coumadin, pacemaker placement, hypertension, hypothyroidism, pancytopenia was brought to the ER patient sustained a fall at her grandson's house as per the patient. Patient states that she was trying to get out of the car when her shoes got stuck and she fell face forward and hurt on the left side. Patient did not lose consciousness and did not have any chest pain or shortness of breath denies any nausea vomiting diarrhea palpitations. Patient has developed small left peri-orbital hematoma. In the ER patient had x-rays and CT scans which shows superior left pubic rami fracture. Patient has been admitted for further pain control and possible rehabilitation placement. Patient has is more left periorbital hematoma and patient has no restriction of eye movement.   Review of Systems: As presented in the history of presenting illness, rest negative.  Past Medical History  Diagnosis Date  . HTN (hypertension)   . Permanent atrial fibrillation   . Bradycardia     s/p PPM  . Hypothyroidism   . Anxiety   . Diverticulitis     history of  . History of hysterectomy   . Long-term (current) use of anticoagulants   . Scoliosis (and kyphoscoliosis), idiopathic   . DVT (deep vein thrombosis) in pregnancy     right peroneal  . Dizziness and giddiness 07/22/2013   Past Surgical History  Procedure Laterality Date  . Pacemaker insertion  03/03/2006    most recent generator (MDT) change 10/30/06 by Dr Verlon Setting  . Cardiac catheterization  01/08/1999    normal LV function  . Cardioversion  10/30/2006    Successful elective DC cardioversion  . Cardioversion  12/29/2002    Successful DC cardioversion  .  Cardioversion  12/03/1999    Successful DC cardioversion  . Cholecystectomy     Social History:  reports that she has never smoked. She has never used smokeless tobacco. She reports that she does not drink alcohol or use illicit drugs. Where does patient live home. Can patient participate in ADLs? Yes.  No Known Allergies  Family History:  Family History  Problem Relation Age of Onset  . Coronary artery disease Mother   . Cancer - Other Father     Pancreatic Cancer      Prior to Admission medications   Medication Sig Start Date End Date Taking? Authorizing Provider  ALPRAZolam (XANAX) 0.25 MG tablet Take 1 tablet (0.25 mg total) by mouth 3 (three) times daily as needed. For anxiety 04/05/14  Yes Peter M Martinique, MD  cholecalciferol (VITAMIN D) 1000 UNITS tablet Take 1,000 Units by mouth daily.   Yes Historical Provider, MD  furosemide (LASIX) 20 MG tablet Take 20 mg by mouth daily.   Yes Historical Provider, MD  levothyroxine (SYNTHROID, LEVOTHROID) 50 MCG tablet Take 1 tablet (50 mcg total) by mouth daily. 01/19/14  Yes Peter M Martinique, MD  metoprolol succinate (TOPROL-XL) 25 MG 24 hr tablet Take 3 tablets (75 mg total) by mouth 2 (two) times daily. 01/19/14  Yes Peter M Martinique, MD  Multiple Vitamin (MULTIVITAMIN WITH MINERALS) TABS Take 1 tablet by mouth daily.   Yes Historical Provider, MD  warfarin (COUMADIN) 5 MG tablet Take 5-7.5 mg by mouth See admin instructions. Take 1 tablet (5mg ) Monday  and Friday. Take 1.5 tablets (7.5mg ) all other days.   Yes Historical Provider, MD    Physical Exam: Filed Vitals:   05/22/14 0106 05/22/14 0200 05/22/14 0230 05/22/14 0241  BP: 144/63 140/65 141/71 141/75  Pulse:  96 92 78  Temp:      TempSrc:      Resp: 25 20 27    SpO2: 92% 94% 90%      General:  Moderately built and nourished.  Eyes: Left periorbital hematoma with no restriction of eye movement or vision.  ENT: No discharge from the ears nose mouth.  Neck: No mass  felt.  Cardiovascular: S1-S2 heard.  Respiratory: No rhonchi or crepitations.  Abdomen: Soft nontender bowel sounds present. No guarding or rigidity.  Skin: Left orbital bruising and left hand and wrist bruising.  Musculoskeletal: Left hand and wrist bruising.  Psychiatric: Appears normal.  Neurologic: Alert awake oriented to time place and person. Moves all extremities.  Labs on Admission:  Basic Metabolic Panel:  Recent Labs Lab 05/22/14 0118  NA 138  K 3.7  CL 98  GLUCOSE 125*  BUN 17  CREATININE 0.60   Liver Function Tests: No results found for this basename: AST, ALT, ALKPHOS, BILITOT, PROT, ALBUMIN,  in the last 168 hours No results found for this basename: LIPASE, AMYLASE,  in the last 168 hours No results found for this basename: AMMONIA,  in the last 168 hours CBC:  Recent Labs Lab 05/22/14 0035 05/22/14 0118  WBC 4.5  --   NEUTROABS 3.8  --   HGB 11.4* 12.6  HCT 34.2* 37.0  MCV 92.9  --   PLT 102*  --    Cardiac Enzymes: No results found for this basename: CKTOTAL, CKMB, CKMBINDEX, TROPONINI,  in the last 168 hours  BNP (last 3 results) No results found for this basename: PROBNP,  in the last 8760 hours CBG: No results found for this basename: GLUCAP,  in the last 168 hours  Radiological Exams on Admission: Dg Chest 2 View  05/21/2014   CLINICAL DATA:  Right lung opacity demonstrated in cervical spine CT.  EXAM: CHEST  2 VIEW  COMPARISON:  11/12/2013  FINDINGS: Cardiac pacemaker. Cardiac enlargement without vascular congestion. Emphysematous changes and scattered fibrosis in the lungs. There is chronic appearing elevation of the right hemidiaphragm. Linear opacity in the right mid lung probably represents either atelectasis or fluid in the minor fissure. Degenerative changes and demineralization of the thoracic spine. Calcified and tortuous aorta. Thoracolumbar scoliosis.  IMPRESSION: Chronic elevation of right hemidiaphragm with linear opacity in the  right mid lung likely representing atelectasis or fluid in the minor fissure. Cardiac enlargement.   Electronically Signed   By: Lucienne Capers M.D.   On: 05/21/2014 23:16   Dg Hip Complete Left  05/21/2014   CLINICAL DATA:  Fall. Left lateral shoulder tenderness and reduced range of motion. Left lateral hip pain.  EXAM: LEFT HIP - COMPLETE 2+ VIEW  COMPARISON:  None  FINDINGS: Bony demineralization. Loss of intervertebral disc height in the lower lumbar spine.  I do not observe a fracture of the left proximal femur. No regional pelvic fracture as identified.  IMPRESSION: 1. No fracture identified. If the patient is unable to bear weight or if otherwise indicated, cross-sectional imaging may be helpful for further workup. 2. Lower lumbar spondylosis and degenerative disc disease. 3. Bony demineralization.   Electronically Signed   By: Sherryl Barters M.D.   On: 05/21/2014 22:24   Ct Head Wo  Contrast  05/21/2014   CLINICAL DATA:  Patient fell to ground from a standing position. Bruise to the left eye and cheek. On Coumadin.  EXAM: CT HEAD WITHOUT CONTRAST  CT MAXILLOFACIAL WITHOUT CONTRAST  CT CERVICAL SPINE WITHOUT CONTRAST  TECHNIQUE: Multidetector CT imaging of the head, cervical spine, and maxillofacial structures were performed using the standard protocol without intravenous contrast. Multiplanar CT image reconstructions of the cervical spine and maxillofacial structures were also generated.  COMPARISON:  CT head 11/28/2013.  CT angio head and neck 05/17/2013.  FINDINGS: CT HEAD FINDINGS  Diffuse cerebral atrophy. Minimal ventricular dilatation consistent with central atrophy. Low-attenuation changes in the deep white matter consistent with small vessel ischemia. No mass effect or midline shift. No abnormal extra-axial fluid collections. Gray-white matter junctions are distinct. Basal cisterns are not effaced. No evidence of acute intracranial hemorrhage. No depressed skull fractures. Mastoid air cells  are not opacified.  CT MAXILLOFACIAL FINDINGS  Postoperative changes in the globes. Mild left periorbital and facial subcutaneous soft tissues hematomas. Globes and extraocular muscles appear otherwise symmetrical and intact. Mucosal thickening in the right maxillary antrum. No acute air-fluid levels noted in the paranasal sinuses. The frontal bones, orbital rims, zygomatic arches, nasal bones, nasal septum, maxilla, mandibles, temporomandibular joints, and pterygoid plates appear intact. No displaced fractures identified.  CT CERVICAL SPINE FINDINGS  Diffuse bone demineralization. There is accentuation of the usual cervical lordosis likely associated with thoracic kyphosis. Diffuse degenerative changes throughout the cervical spine with narrowed interspaces and prominent endplate hypertrophic changes most prominently at C4-5, C5-6, and C6-7 levels. Degenerative changes throughout the facet joints and at C1-2. Prominent disc osteophyte complexes noted at C4-5, C5-6, and C6-7 levels. Degenerative changes with bridging anterior osteophytes causing near ankylosis of the visualized upper thoracic spine. No vertebral compression deformities. No prevertebral soft tissue swelling. Bone cortex and trabecular architecture appear intact. No focal bone lesion or bone destruction is appreciated.  Incidental note of rounded opacity in the right mid lung on the inferior most slice. Right lung pathology such as mass or infiltration cannot be excluded. Consider chest x-ray correlation.  IMPRESSION: No acute intracranial abnormalities. Chronic atrophy and small vessel ischemic changes.  Left periorbital and facial soft tissue swelling/hematoma. No displaced fractures identified in the facial bones or orbits.  Diffuse degenerative changes in the cervical spine. No displaced fractures identified.  Indeterminate opacity in the right lung. Chest x-ray correlation recommended.   Electronically Signed   By: Lucienne Capers M.D.   On:  05/21/2014 22:08   Ct Cervical Spine Wo Contrast  05/21/2014   CLINICAL DATA:  Patient fell to ground from a standing position. Bruise to the left eye and cheek. On Coumadin.  EXAM: CT HEAD WITHOUT CONTRAST  CT MAXILLOFACIAL WITHOUT CONTRAST  CT CERVICAL SPINE WITHOUT CONTRAST  TECHNIQUE: Multidetector CT imaging of the head, cervical spine, and maxillofacial structures were performed using the standard protocol without intravenous contrast. Multiplanar CT image reconstructions of the cervical spine and maxillofacial structures were also generated.  COMPARISON:  CT head 11/28/2013.  CT angio head and neck 05/17/2013.  FINDINGS: CT HEAD FINDINGS  Diffuse cerebral atrophy. Minimal ventricular dilatation consistent with central atrophy. Low-attenuation changes in the deep white matter consistent with small vessel ischemia. No mass effect or midline shift. No abnormal extra-axial fluid collections. Gray-white matter junctions are distinct. Basal cisterns are not effaced. No evidence of acute intracranial hemorrhage. No depressed skull fractures. Mastoid air cells are not opacified.  CT MAXILLOFACIAL FINDINGS  Postoperative  changes in the globes. Mild left periorbital and facial subcutaneous soft tissues hematomas. Globes and extraocular muscles appear otherwise symmetrical and intact. Mucosal thickening in the right maxillary antrum. No acute air-fluid levels noted in the paranasal sinuses. The frontal bones, orbital rims, zygomatic arches, nasal bones, nasal septum, maxilla, mandibles, temporomandibular joints, and pterygoid plates appear intact. No displaced fractures identified.  CT CERVICAL SPINE FINDINGS  Diffuse bone demineralization. There is accentuation of the usual cervical lordosis likely associated with thoracic kyphosis. Diffuse degenerative changes throughout the cervical spine with narrowed interspaces and prominent endplate hypertrophic changes most prominently at C4-5, C5-6, and C6-7 levels.  Degenerative changes throughout the facet joints and at C1-2. Prominent disc osteophyte complexes noted at C4-5, C5-6, and C6-7 levels. Degenerative changes with bridging anterior osteophytes causing near ankylosis of the visualized upper thoracic spine. No vertebral compression deformities. No prevertebral soft tissue swelling. Bone cortex and trabecular architecture appear intact. No focal bone lesion or bone destruction is appreciated.  Incidental note of rounded opacity in the right mid lung on the inferior most slice. Right lung pathology such as mass or infiltration cannot be excluded. Consider chest x-ray correlation.  IMPRESSION: No acute intracranial abnormalities. Chronic atrophy and small vessel ischemic changes.  Left periorbital and facial soft tissue swelling/hematoma. No displaced fractures identified in the facial bones or orbits.  Diffuse degenerative changes in the cervical spine. No displaced fractures identified.  Indeterminate opacity in the right lung. Chest x-ray correlation recommended.   Electronically Signed   By: Lucienne Capers M.D.   On: 05/21/2014 22:08   Ct Hip Left Wo Contrast  05/22/2014   CLINICAL DATA:  Patient fell to ground from a standing position. Pain.  EXAM: CT OF THE LEFT HIP WITHOUT CONTRAST  TECHNIQUE: Multidetector CT imaging was performed according to the standard protocol. Multiplanar CT image reconstructions were also generated.  COMPARISON:  Left hip 05/21/2014  FINDINGS: Prominent diffuse bone demineralization. Mild degenerative changes in the left hip. Left hip appears intact. No displaced fractures are identified. There is focal cortical irregularity in the superior left pubic rami as at the juncture point to the anterior acetabulum. This suggests a nondisplaced fracture. Inferior pubic ramus appears intact. No displacement of the symphysis pubis. No significant soft tissue hematoma or E fusion.  IMPRESSION: Cortical irregularity suggesting nondisplaced  fracture of the superior left pubic ramus. Left hip appears intact without displaced fracture identified. Degenerative change in the left hip. Diffuse bone demineralization.   Electronically Signed   By: Lucienne Capers M.D.   On: 05/22/2014 00:15   Dg Shoulder Left  05/21/2014   CLINICAL DATA:  Left lateral shoulder pain with limited range of motion after a fall tonight.  EXAM: LEFT SHOULDER - 2+ VIEW  COMPARISON:  None.  FINDINGS: Diffuse bone demineralization. There is no evidence of fracture or dislocation. There is no evidence of arthropathy or other focal bone abnormality. Soft tissues are unremarkable. Degenerative changes in the spine.  IMPRESSION: Negative.   Electronically Signed   By: Lucienne Capers M.D.   On: 05/21/2014 22:21   Ct Maxillofacial Wo Cm  05/21/2014   CLINICAL DATA:  Patient fell to ground from a standing position. Bruise to the left eye and cheek. On Coumadin.  EXAM: CT HEAD WITHOUT CONTRAST  CT MAXILLOFACIAL WITHOUT CONTRAST  CT CERVICAL SPINE WITHOUT CONTRAST  TECHNIQUE: Multidetector CT imaging of the head, cervical spine, and maxillofacial structures were performed using the standard protocol without intravenous contrast. Multiplanar CT image reconstructions  of the cervical spine and maxillofacial structures were also generated.  COMPARISON:  CT head 11/28/2013.  CT angio head and neck 05/17/2013.  FINDINGS: CT HEAD FINDINGS  Diffuse cerebral atrophy. Minimal ventricular dilatation consistent with central atrophy. Low-attenuation changes in the deep white matter consistent with small vessel ischemia. No mass effect or midline shift. No abnormal extra-axial fluid collections. Gray-white matter junctions are distinct. Basal cisterns are not effaced. No evidence of acute intracranial hemorrhage. No depressed skull fractures. Mastoid air cells are not opacified.  CT MAXILLOFACIAL FINDINGS  Postoperative changes in the globes. Mild left periorbital and facial subcutaneous soft  tissues hematomas. Globes and extraocular muscles appear otherwise symmetrical and intact. Mucosal thickening in the right maxillary antrum. No acute air-fluid levels noted in the paranasal sinuses. The frontal bones, orbital rims, zygomatic arches, nasal bones, nasal septum, maxilla, mandibles, temporomandibular joints, and pterygoid plates appear intact. No displaced fractures identified.  CT CERVICAL SPINE FINDINGS  Diffuse bone demineralization. There is accentuation of the usual cervical lordosis likely associated with thoracic kyphosis. Diffuse degenerative changes throughout the cervical spine with narrowed interspaces and prominent endplate hypertrophic changes most prominently at C4-5, C5-6, and C6-7 levels. Degenerative changes throughout the facet joints and at C1-2. Prominent disc osteophyte complexes noted at C4-5, C5-6, and C6-7 levels. Degenerative changes with bridging anterior osteophytes causing near ankylosis of the visualized upper thoracic spine. No vertebral compression deformities. No prevertebral soft tissue swelling. Bone cortex and trabecular architecture appear intact. No focal bone lesion or bone destruction is appreciated.  Incidental note of rounded opacity in the right mid lung on the inferior most slice. Right lung pathology such as mass or infiltration cannot be excluded. Consider chest x-ray correlation.  IMPRESSION: No acute intracranial abnormalities. Chronic atrophy and small vessel ischemic changes.  Left periorbital and facial soft tissue swelling/hematoma. No displaced fractures identified in the facial bones or orbits.  Diffuse degenerative changes in the cervical spine. No displaced fractures identified.  Indeterminate opacity in the right lung. Chest x-ray correlation recommended.   Electronically Signed   By: Lucienne Capers M.D.   On: 05/21/2014 22:08     Assessment/Plan Principal Problem:   Fracture of multiple pubic rami Active Problems:   Atrial fibrillation    Hypertension   Deep venous thrombosis of lower leg   1. Superior left pubic rami fracture with left periorbital hematoma status post mechanical fall - at this time patient has been placed on Tramadol 25 mg every 6 hourly when necessary pain and patient does not want any narcotics. Physical therapy has been consulted and patient may need rehabilitation placement. Patient is on Coumadin and patient's left periorbital hematoma has to be closely watched for any worsening. 2. Atrial fibrillation status post pacemaker placement - rate was mildly elevated around 100 - 110 beats per minute. Patient has not taken beta blocker before coming in and it was given. Coumadin per pharmacy. 3. History of DVT - on Coumadin. See #1 regarding Coumadin. 4. Hypertension - continue home medications. 5. Hypothyroidism - continue Synthroid. 6. Chronic pancytopenia - follow CBC.    Code Status: Full code. Family Communication: Patient's daughter at the bedside.  Disposition Plan: Admit to inpatient.    KAKRAKANDY,ARSHAD N. [facility] Pager [phone].  If 7PM-7AM, please contact night-coverage [email or web address] Password [email or web address] 05/22/2014, 3:30 AM

## 2014-05-22 NOTE — Progress Notes (Signed)
Patient admitted after midnight.  Please see H&P.  PT/OT consult.  Suspect patient will need rehab.  Eulogio Bear DO

## 2014-05-23 ENCOUNTER — Telehealth: Payer: Self-pay | Admitting: Cardiology

## 2014-05-23 DIAGNOSIS — W19XXXA Unspecified fall, initial encounter: Secondary | ICD-10-CM

## 2014-05-23 DIAGNOSIS — I4891 Unspecified atrial fibrillation: Secondary | ICD-10-CM

## 2014-05-23 DIAGNOSIS — I824Z9 Acute embolism and thrombosis of unspecified deep veins of unspecified distal lower extremity: Secondary | ICD-10-CM

## 2014-05-23 LAB — CBC
HCT: 32.8 % — ABNORMAL LOW (ref 36.0–46.0)
Hemoglobin: 10.6 g/dL — ABNORMAL LOW (ref 12.0–15.0)
MCH: 30.5 pg (ref 26.0–34.0)
MCHC: 32.3 g/dL (ref 30.0–36.0)
MCV: 94.5 fL (ref 78.0–100.0)
Platelets: 98 10*3/uL — ABNORMAL LOW (ref 150–400)
RBC: 3.47 MIL/uL — ABNORMAL LOW (ref 3.87–5.11)
RDW: 16 % — AB (ref 11.5–15.5)
WBC: 5.1 10*3/uL (ref 4.0–10.5)

## 2014-05-23 LAB — BASIC METABOLIC PANEL
BUN: 18 mg/dL (ref 6–23)
CALCIUM: 8.4 mg/dL (ref 8.4–10.5)
CO2: 26 meq/L (ref 19–32)
Chloride: 101 mEq/L (ref 96–112)
Creatinine, Ser: 0.54 mg/dL (ref 0.50–1.10)
GFR calc non Af Amer: 81 mL/min — ABNORMAL LOW (ref >90)
Glucose, Bld: 109 mg/dL — ABNORMAL HIGH (ref 70–99)
Potassium: 4.9 mEq/L (ref 3.7–5.3)
Sodium: 137 mEq/L (ref 137–147)

## 2014-05-23 LAB — PROTIME-INR
INR: 3.14 — ABNORMAL HIGH (ref 0.00–1.49)
PROTHROMBIN TIME: 31.1 s — AB (ref 11.6–15.2)

## 2014-05-23 NOTE — Clinical Social Work Psychosocial (Signed)
  Clinical Social Work Department BRIEF PSYCHOSOCIAL ASSESSMENT 05/23/2014  Patient:  Christy Miranda     Account Number:  [id number]     Admit date:  05/22/2014  Clinical Social Worker:  Daiva Huge  Date/Time:  05/23/2014 10:18 AM  Referred by:  Physician  Date Referred:  05/23/2014 Referred for  SNF Placement   Other Referral:   Interview type:  Patient Other interview type:   Will contact her daughter as well per her request    PSYCHOSOCIAL DATA Living Status:  ALONE Admitted from facility:   Level of care:   Primary support name:  Daughter Bethena Roys Primary support relationship to patient:  FAMILY Degree of support available:   good    CURRENT CONCERNS Current Concerns  Post-Acute Placement   Other Concerns:    SOCIAL WORK ASSESSMENT / PLAN Spoke with patient to discuss possible SNF placement at d/c- she is open to this but is hopeful she wont have to stay long-  "I have been so careful not to fall and then fall at my grandsons house".  She is quite independent- doesnt drive but otherwise takes care of her self/home, etc.   Assessment/plan status:  Other - See comment Other assessment/ plan:   FL2 and PASARR   Information/referral to community resources:   SNF list    PATIENT'S/FAMILY'S RESPONSE TO PLAN OF CARE: Patient agreeable to SNF search- she is hoing she wont need to be there long- "I will be 89 on the 4th of July".  Will contact daughter as well to assist with SNF selection.          Eduard Clos, MSW, [place]

## 2014-05-23 NOTE — Telephone Encounter (Signed)
Returned call to patient she is currently in hospital with a fractured pelvis.Stated toprol was reduced due to low blood pressure.Stated she will be transferred to a rehab center tomorrow for physical therapy.Patient was reassured,will let Dr.Jordan know.

## 2014-05-23 NOTE — Clinical Social Work Placement (Addendum)
Clinical Social Work Department CLINICAL SOCIAL WORK PLACEMENT NOTE 05/23/2014  Patient:  Christy Miranda  Account Number:  [id number] Admit date:  05/22/2014  Clinical Social Worker:  Daiva Huge  Date/time:  05/23/2014 10:23 AM  Clinical Social Work is seeking post-discharge placement for this patient at the following level of care:   [facility]   (*CSW will update this form in [place] as items are completed)   05/23/2014  Patient/family provided with [facility] Department of Clinical Social Work's list of facilities offering this level of care within the geographic area requested by the patient (or if unable, by the patient's family).  05/23/2014  Patient/family informed of their freedom to choose among providers that offer the needed level of care, that participate in Medicare, Medicaid or managed care program needed by the patient, have an available bed and are willing to accept the patient.  05/22/2014  Patient/family informed of [facility]' ownership interest in [facility], as well as of the fact that they are under no obligation to receive care at this facility.  PASARR submitted to EDS on 05/22/2014 PASARR number received on 05/22/2014  FL2 transmitted to all facilities in geographic area requested by pt/family on  05/23/2014 FL2 transmitted to all facilities within larger geographic area on   Patient informed that his/her managed care company has contracts with or will negotiate with  certain facilities, including the following:     Patient/family informed of bed offers received:  05/24/14 Patient chooses bed at [facility] Physician recommends and patient chooses bed at    Patient to be transferred to [facility] on  05/25/14 Patient to be transferred to facility by [facility] Patient and family notified of transfer on  Name of family member notified:  Pt's daughter Bethena Roys notified in phone call. Will inform Bethena Roys when Corey Harold has been scheduled, as she  will meet pt at facility. CSW signing off.  The following physician request were entered in Epic:   Additional Comments:   Eduard Clos, MSW, [place]

## 2014-05-23 NOTE — Telephone Encounter (Signed)
New problem   Pt is in [place] tower room  15 pt would like a call back please. Pt stated she was told to call when she is in the hosp.   Pt also has some medication questions.

## 2014-05-23 NOTE — Progress Notes (Signed)
PROGRESS NOTE  Christy Miranda GMW:[id number] DOB: 1921-04-10 DOA: 05/21/2014 PCP: [name], MD  Assessment/Plan: Superior left pubic rami fracture with left periorbital hematoma status post mechanical fall -  -Tramadol 25 mg every 6 hourly when necessary pain and patient does not want any narcotics.  -Physical therapy recs SNF placement     Atrial fibrillation status post pacemaker placement -  -reduced BB -Coumadin per pharmacy.   History of DVT - on Coumadin.   Hypertension - low BP so will reduce BB; continue home medications.   Hypothyroidism - continue Synthroid.   Chronic pancytopenia - follow CBC   Code Status: full Family Communication: patient- LM with daughter Disposition Plan: SNF when bed available   Consultants:    Procedures:  none    HPI/Subjective: Some dizziness when patient got up yesterday  Objective: Filed Vitals:   05/23/14 0555  BP: 125/55  Pulse: 96  Temp: 98.3 F (36.8 C)  Resp:     Intake/Output Summary (Last 24 hours) at 05/23/14 0947 Last data filed at 05/23/14 0814  Gross per 24 hour  Intake    120 ml  Output    200 ml  Net    -80 ml   Filed Weights   05/22/14 0340  Weight: 44.8 kg (98 lb 12.3 oz)    Exam:   General:  A+Ox3, NAD  Cardiovascular: rrr  Respiratory: clear anterior  Abdomen: +BS, soft  Musculoskeletal: moves all 4 ext  Data Reviewed: Basic Metabolic Panel:  Recent Labs Lab 05/22/14 0118 05/22/14 0430 05/23/14 0500  NA 138 137 137  K 3.7 3.7 4.9  CL 98 99 101  CO2  --  27 26  GLUCOSE 125* 170* 109*  BUN 17 17 18   CREATININE 0.60 0.59 0.54  CALCIUM  --  8.8 8.4   Liver Function Tests: No results found for this basename: AST, ALT, ALKPHOS, BILITOT, PROT, ALBUMIN,  in the last 168 hours No results found for this basename: LIPASE, AMYLASE,  in the last 168 hours No results found for this basename: AMMONIA,  in the last 168 hours CBC:  Recent Labs Lab 05/22/14 0035  05/22/14 0118 05/22/14 0430 05/23/14 0500  WBC 4.5  --  4.3 5.1  NEUTROABS 3.8  --   --   --   HGB 11.4* 12.6 10.9* 10.6*  HCT 34.2* 37.0 34.0* 32.8*  MCV 92.9  --  93.9 94.5  PLT 102*  --  105* 98*   Cardiac Enzymes: No results found for this basename: CKTOTAL, CKMB, CKMBINDEX, TROPONINI,  in the last 168 hours BNP (last 3 results) No results found for this basename: PROBNP,  in the last 8760 hours CBG: No results found for this basename: GLUCAP,  in the last 168 hours  No results found for this or any previous visit (from the past 240 hour(s)).   Studies: Dg Chest 2 View  05/21/2014   CLINICAL DATA:  Right lung opacity demonstrated in cervical spine CT.  EXAM: CHEST  2 VIEW  COMPARISON:  11/12/2013  FINDINGS: Cardiac pacemaker. Cardiac enlargement without vascular congestion. Emphysematous changes and scattered fibrosis in the lungs. There is chronic appearing elevation of the right hemidiaphragm. Linear opacity in the right mid lung probably represents either atelectasis or fluid in the minor fissure. Degenerative changes and demineralization of the thoracic spine. Calcified and tortuous aorta. Thoracolumbar scoliosis.  IMPRESSION: Chronic elevation of right hemidiaphragm with linear opacity in the right mid lung likely representing atelectasis or fluid in the minor  fissure. Cardiac enlargement.   Electronically Signed   By: Lucienne Capers M.D.   On: 05/21/2014 23:16   Dg Hip Complete Left  05/21/2014   CLINICAL DATA:  Fall. Left lateral shoulder tenderness and reduced range of motion. Left lateral hip pain.  EXAM: LEFT HIP - COMPLETE 2+ VIEW  COMPARISON:  None  FINDINGS: Bony demineralization. Loss of intervertebral disc height in the lower lumbar spine.  I do not observe a fracture of the left proximal femur. No regional pelvic fracture as identified.  IMPRESSION: 1. No fracture identified. If the patient is unable to bear weight or if otherwise indicated, cross-sectional imaging may  be helpful for further workup. 2. Lower lumbar spondylosis and degenerative disc disease. 3. Bony demineralization.   Electronically Signed   By: Sherryl Barters M.D.   On: 05/21/2014 22:24   Ct Head Wo Contrast  05/21/2014   CLINICAL DATA:  Patient fell to ground from a standing position. Bruise to the left eye and cheek. On Coumadin.  EXAM: CT HEAD WITHOUT CONTRAST  CT MAXILLOFACIAL WITHOUT CONTRAST  CT CERVICAL SPINE WITHOUT CONTRAST  TECHNIQUE: Multidetector CT imaging of the head, cervical spine, and maxillofacial structures were performed using the standard protocol without intravenous contrast. Multiplanar CT image reconstructions of the cervical spine and maxillofacial structures were also generated.  COMPARISON:  CT head 11/28/2013.  CT angio head and neck 05/17/2013.  FINDINGS: CT HEAD FINDINGS  Diffuse cerebral atrophy. Minimal ventricular dilatation consistent with central atrophy. Low-attenuation changes in the deep white matter consistent with small vessel ischemia. No mass effect or midline shift. No abnormal extra-axial fluid collections. Gray-white matter junctions are distinct. Basal cisterns are not effaced. No evidence of acute intracranial hemorrhage. No depressed skull fractures. Mastoid air cells are not opacified.  CT MAXILLOFACIAL FINDINGS  Postoperative changes in the globes. Mild left periorbital and facial subcutaneous soft tissues hematomas. Globes and extraocular muscles appear otherwise symmetrical and intact. Mucosal thickening in the right maxillary antrum. No acute air-fluid levels noted in the paranasal sinuses. The frontal bones, orbital rims, zygomatic arches, nasal bones, nasal septum, maxilla, mandibles, temporomandibular joints, and pterygoid plates appear intact. No displaced fractures identified.  CT CERVICAL SPINE FINDINGS  Diffuse bone demineralization. There is accentuation of the usual cervical lordosis likely associated with thoracic kyphosis. Diffuse degenerative  changes throughout the cervical spine with narrowed interspaces and prominent endplate hypertrophic changes most prominently at C4-5, C5-6, and C6-7 levels. Degenerative changes throughout the facet joints and at C1-2. Prominent disc osteophyte complexes noted at C4-5, C5-6, and C6-7 levels. Degenerative changes with bridging anterior osteophytes causing near ankylosis of the visualized upper thoracic spine. No vertebral compression deformities. No prevertebral soft tissue swelling. Bone cortex and trabecular architecture appear intact. No focal bone lesion or bone destruction is appreciated.  Incidental note of rounded opacity in the right mid lung on the inferior most slice. Right lung pathology such as mass or infiltration cannot be excluded. Consider chest x-ray correlation.  IMPRESSION: No acute intracranial abnormalities. Chronic atrophy and small vessel ischemic changes.  Left periorbital and facial soft tissue swelling/hematoma. No displaced fractures identified in the facial bones or orbits.  Diffuse degenerative changes in the cervical spine. No displaced fractures identified.  Indeterminate opacity in the right lung. Chest x-ray correlation recommended.   Electronically Signed   By: Lucienne Capers M.D.   On: 05/21/2014 22:08   Ct Cervical Spine Wo Contrast  05/21/2014   CLINICAL DATA:  Patient fell to ground from a  standing position. Bruise to the left eye and cheek. On Coumadin.  EXAM: CT HEAD WITHOUT CONTRAST  CT MAXILLOFACIAL WITHOUT CONTRAST  CT CERVICAL SPINE WITHOUT CONTRAST  TECHNIQUE: Multidetector CT imaging of the head, cervical spine, and maxillofacial structures were performed using the standard protocol without intravenous contrast. Multiplanar CT image reconstructions of the cervical spine and maxillofacial structures were also generated.  COMPARISON:  CT head 11/28/2013.  CT angio head and neck 05/17/2013.  FINDINGS: CT HEAD FINDINGS  Diffuse cerebral atrophy. Minimal ventricular  dilatation consistent with central atrophy. Low-attenuation changes in the deep white matter consistent with small vessel ischemia. No mass effect or midline shift. No abnormal extra-axial fluid collections. Gray-white matter junctions are distinct. Basal cisterns are not effaced. No evidence of acute intracranial hemorrhage. No depressed skull fractures. Mastoid air cells are not opacified.  CT MAXILLOFACIAL FINDINGS  Postoperative changes in the globes. Mild left periorbital and facial subcutaneous soft tissues hematomas. Globes and extraocular muscles appear otherwise symmetrical and intact. Mucosal thickening in the right maxillary antrum. No acute air-fluid levels noted in the paranasal sinuses. The frontal bones, orbital rims, zygomatic arches, nasal bones, nasal septum, maxilla, mandibles, temporomandibular joints, and pterygoid plates appear intact. No displaced fractures identified.  CT CERVICAL SPINE FINDINGS  Diffuse bone demineralization. There is accentuation of the usual cervical lordosis likely associated with thoracic kyphosis. Diffuse degenerative changes throughout the cervical spine with narrowed interspaces and prominent endplate hypertrophic changes most prominently at C4-5, C5-6, and C6-7 levels. Degenerative changes throughout the facet joints and at C1-2. Prominent disc osteophyte complexes noted at C4-5, C5-6, and C6-7 levels. Degenerative changes with bridging anterior osteophytes causing near ankylosis of the visualized upper thoracic spine. No vertebral compression deformities. No prevertebral soft tissue swelling. Bone cortex and trabecular architecture appear intact. No focal bone lesion or bone destruction is appreciated.  Incidental note of rounded opacity in the right mid lung on the inferior most slice. Right lung pathology such as mass or infiltration cannot be excluded. Consider chest x-ray correlation.  IMPRESSION: No acute intracranial abnormalities. Chronic atrophy and small  vessel ischemic changes.  Left periorbital and facial soft tissue swelling/hematoma. No displaced fractures identified in the facial bones or orbits.  Diffuse degenerative changes in the cervical spine. No displaced fractures identified.  Indeterminate opacity in the right lung. Chest x-ray correlation recommended.   Electronically Signed   By: Lucienne Capers M.D.   On: 05/21/2014 22:08   Ct Hip Left Wo Contrast  05/22/2014   CLINICAL DATA:  Patient fell to ground from a standing position. Pain.  EXAM: CT OF THE LEFT HIP WITHOUT CONTRAST  TECHNIQUE: Multidetector CT imaging was performed according to the standard protocol. Multiplanar CT image reconstructions were also generated.  COMPARISON:  Left hip 05/21/2014  FINDINGS: Prominent diffuse bone demineralization. Mild degenerative changes in the left hip. Left hip appears intact. No displaced fractures are identified. There is focal cortical irregularity in the superior left pubic rami as at the juncture point to the anterior acetabulum. This suggests a nondisplaced fracture. Inferior pubic ramus appears intact. No displacement of the symphysis pubis. No significant soft tissue hematoma or E fusion.  IMPRESSION: Cortical irregularity suggesting nondisplaced fracture of the superior left pubic ramus. Left hip appears intact without displaced fracture identified. Degenerative change in the left hip. Diffuse bone demineralization.   Electronically Signed   By: Lucienne Capers M.D.   On: 05/22/2014 00:15   Dg Shoulder Left  05/21/2014   CLINICAL DATA:  Left  lateral shoulder pain with limited range of motion after a fall tonight.  EXAM: LEFT SHOULDER - 2+ VIEW  COMPARISON:  None.  FINDINGS: Diffuse bone demineralization. There is no evidence of fracture or dislocation. There is no evidence of arthropathy or other focal bone abnormality. Soft tissues are unremarkable. Degenerative changes in the spine.  IMPRESSION: Negative.   Electronically Signed   By: Lucienne Capers M.D.   On: 05/21/2014 22:21   Ct Maxillofacial Wo Cm  05/21/2014   CLINICAL DATA:  Patient fell to ground from a standing position. Bruise to the left eye and cheek. On Coumadin.  EXAM: CT HEAD WITHOUT CONTRAST  CT MAXILLOFACIAL WITHOUT CONTRAST  CT CERVICAL SPINE WITHOUT CONTRAST  TECHNIQUE: Multidetector CT imaging of the head, cervical spine, and maxillofacial structures were performed using the standard protocol without intravenous contrast. Multiplanar CT image reconstructions of the cervical spine and maxillofacial structures were also generated.  COMPARISON:  CT head 11/28/2013.  CT angio head and neck 05/17/2013.  FINDINGS: CT HEAD FINDINGS  Diffuse cerebral atrophy. Minimal ventricular dilatation consistent with central atrophy. Low-attenuation changes in the deep white matter consistent with small vessel ischemia. No mass effect or midline shift. No abnormal extra-axial fluid collections. Gray-white matter junctions are distinct. Basal cisterns are not effaced. No evidence of acute intracranial hemorrhage. No depressed skull fractures. Mastoid air cells are not opacified.  CT MAXILLOFACIAL FINDINGS  Postoperative changes in the globes. Mild left periorbital and facial subcutaneous soft tissues hematomas. Globes and extraocular muscles appear otherwise symmetrical and intact. Mucosal thickening in the right maxillary antrum. No acute air-fluid levels noted in the paranasal sinuses. The frontal bones, orbital rims, zygomatic arches, nasal bones, nasal septum, maxilla, mandibles, temporomandibular joints, and pterygoid plates appear intact. No displaced fractures identified.  CT CERVICAL SPINE FINDINGS  Diffuse bone demineralization. There is accentuation of the usual cervical lordosis likely associated with thoracic kyphosis. Diffuse degenerative changes throughout the cervical spine with narrowed interspaces and prominent endplate hypertrophic changes most prominently at C4-5, C5-6, and C6-7  levels. Degenerative changes throughout the facet joints and at C1-2. Prominent disc osteophyte complexes noted at C4-5, C5-6, and C6-7 levels. Degenerative changes with bridging anterior osteophytes causing near ankylosis of the visualized upper thoracic spine. No vertebral compression deformities. No prevertebral soft tissue swelling. Bone cortex and trabecular architecture appear intact. No focal bone lesion or bone destruction is appreciated.  Incidental note of rounded opacity in the right mid lung on the inferior most slice. Right lung pathology such as mass or infiltration cannot be excluded. Consider chest x-ray correlation.  IMPRESSION: No acute intracranial abnormalities. Chronic atrophy and small vessel ischemic changes.  Left periorbital and facial soft tissue swelling/hematoma. No displaced fractures identified in the facial bones or orbits.  Diffuse degenerative changes in the cervical spine. No displaced fractures identified.  Indeterminate opacity in the right lung. Chest x-ray correlation recommended.   Electronically Signed   By: Lucienne Capers M.D.   On: 05/21/2014 22:08    Scheduled Meds: . cholecalciferol  1,000 Units Oral Daily  . furosemide  20 mg Oral Daily  . levothyroxine  50 mcg Oral QAC breakfast  . metoprolol succinate  50 mg Oral BID  . multivitamin with minerals  1 tablet Oral Daily  . sodium chloride  3 mL Intravenous Q12H  . warfarin  5 mg Oral Once per day on Mon Fri  . warfarin  7.5 mg Oral Once per day on Sun Tue Wed Thu Sat  .  warfarin  1 each Does not apply Once  . Warfarin - Pharmacist Dosing Inpatient   Does not apply q1800   Continuous Infusions:  Antibiotics Given (last 72 hours)   None      Principal Problem:   Fracture of multiple pubic rami Active Problems:   Atrial fibrillation   Hypertension   Deep venous thrombosis of lower leg    Time spent: 25 min    VANN, JESSICA  [facility] Pager [phone]. If 7PM-7AM, please contact  night-coverage at [email or web address], password [facility] 05/23/2014, 9:47 AM  LOS: 2 days

## 2014-05-23 NOTE — ED Provider Notes (Signed)
Medical screening examination/treatment/procedure(s) were performed by non-physician practitioner and as supervising physician I was immediately available for consultation/collaboration.   EKG Interpretation None        Alfonzo Feller, DO 05/23/14 1826

## 2014-05-23 NOTE — Progress Notes (Signed)
ANTICOAGULATION CONSULT NOTE - Follow Up Consult  Pharmacy Consult for coumadin Indication: atrial fibrillation  No Known Allergies  Patient Measurements: Weight: 98 lb 12.3 oz (44.8 kg)  Vital Signs: Temp: 98.3 F (36.8 C) (06/23 0555) BP: 125/55 mmHg (06/23 0555) Pulse Rate: 96 (06/23 0555)  Labs:  Recent Labs  05/21/14 2118  05/22/14 0035 05/22/14 0118 05/22/14 0430 05/23/14 0500  HGB  --   < > 11.4* 12.6 10.9* 10.6*  HCT  --   < > 34.2* 37.0 34.0* 32.8*  PLT  --   --  102*  --  105* 98*  LABPROT 27.3*  --   --   --   --  31.1*  INR 2.64*  --   --   --   --  3.14*  CREATININE  --   --   --  0.60 0.59 0.54  < > = values in this interval not displayed.  The CrCl is unknown because both a height and weight (above a minimum accepted value) are required for this calculation.   Medications:  Scheduled:  . cholecalciferol  1,000 Units Oral Daily  . furosemide  20 mg Oral Daily  . levothyroxine  50 mcg Oral QAC breakfast  . metoprolol succinate  50 mg Oral BID  . multivitamin with minerals  1 tablet Oral Daily  . sodium chloride  3 mL Intravenous Q12H  . warfarin  5 mg Oral Once per day on Mon Fri  . warfarin  7.5 mg Oral Once per day on Sun Tue Wed Thu Sat  . warfarin  1 each Does not apply Once  . Warfarin - Pharmacist Dosing Inpatient   Does not apply q1800    Assessment: 80 yo female here s/p fall with left pubic rami fracture with left periorbital hematoma on coumadin PTA and pharmacy has been consulted to dose. INR today is 3.14 and Hg= 10.6 (noted 12.6 on 6/22).  Goal of Therapy:  INR 2-3 Monitor platelets by anticoagulation protocol: Yes   Plan:  -Hold coumadin today -Daily PT/INR  Hildred Laser, Pharm D 05/23/2014 2:30 PM

## 2014-05-24 DIAGNOSIS — S329XXA Fracture of unspecified parts of lumbosacral spine and pelvis, initial encounter for closed fracture: Secondary | ICD-10-CM

## 2014-05-24 DIAGNOSIS — D696 Thrombocytopenia, unspecified: Secondary | ICD-10-CM

## 2014-05-24 HISTORY — DX: Fracture of unspecified parts of lumbosacral spine and pelvis, initial encounter for closed fracture: S32.9XXA

## 2014-05-24 LAB — PROTIME-INR
INR: 2.16 — ABNORMAL HIGH (ref 0.00–1.49)
Prothrombin Time: 23.4 seconds — ABNORMAL HIGH (ref 11.6–15.2)

## 2014-05-24 MED ORDER — WARFARIN SODIUM 5 MG PO TABS
5.0000 mg | ORAL_TABLET | Freq: Once | ORAL | Status: AC
  Administered 2014-05-24: 5 mg via ORAL
  Filled 2014-05-24: qty 1

## 2014-05-24 NOTE — Progress Notes (Signed)
Physical Therapy Treatment Patient Details Name: Christy Miranda MRN: [id number] DOB: February 19, 1921 Today's Date: 05/24/2014    History of Present Illness Pt. fell and sustained superior L pubic rami fx. Pt. PMH: Afib, DVT, pacemaker, HTN, anxiety, scoliosis, hyphoscoliosis.     PT Comments    Patient progressing with mobility. Able to add in some therex this session without an increase in pain. Patient voicing concerns and questions about therapy after discharge. Able to answer questions to patients satisfaction and told her to follow up with nursing if further questions arise. Will continue with current plan of care. Continue to recommend SNF for ongoing Physical Therapy.     Follow Up Recommendations  SNF     Equipment Recommendations  Rolling walker with 5" wheels;3in1 (PT)    Recommendations for Other Services       Precautions / Restrictions Precautions Precautions: Fall Restrictions LLE Weight Bearing: Weight bearing as tolerated    Mobility  Bed Mobility               General bed mobility comments: Patient up in recliner before and after session  Transfers Overall transfer level: Needs assistance Equipment used: Rolling walker (2 wheeled)   Sit to Stand: Min assist         General transfer comment: Min A to steady stand and to ensure balance. Cues for best and safest technique. Better posture this session with stand  Ambulation/Gait Ambulation/Gait assistance: Min assist;Min guard Ambulation Distance (Feet): 30 Feet Assistive device: Rolling walker (2 wheeled) Gait Pattern/deviations: Step-to pattern;Decreased step length - right;Decreased step length - left     General Gait Details: Patient ambulates with kyphotic posture ( she states is baseline). Patient with shuffled gait. No LOB noted. Min A at time for safe use of RW   Financial trader Rankin (Stroke Patients Only)       Balance                                    Cognition Arousal/Alertness: Awake/alert Behavior During Therapy: WFL for tasks assessed/performed Overall Cognitive Status: Within Functional Limits for tasks assessed                      Exercises General Exercises - Lower Extremity Long Arc Quad: AROM;Both;10 reps Hip Flexion/Marching: AROM;Both;10 reps    General Comments        Pertinent Vitals/Pain no apparent distress     Home Living                      Prior Function            PT Goals (current goals can now be found in the care plan section) Progress towards PT goals: Progressing toward goals    Frequency  Min 3X/week    PT Plan Current plan remains appropriate    Co-evaluation             End of Session   Activity Tolerance: Patient tolerated treatment well Patient left: in chair;with call bell/phone within reach     Time: 1027-2536 PT Time Calculation (min): 24 min  Charges:  $Gait Training: 8-22 mins $Therapeutic Activity: 8-22 mins                    G Codes:  Jacqualyn Posey 05/24/2014, 2:00 PM 05/24/2014 Jacqualyn Posey PTA [phone] pager [phone] office

## 2014-05-24 NOTE — Clinical Social Work Note (Signed)
SNF bed offer accepted by patient and daughter at [facility]. Anticipate dc tomorrow- MD: please sign FL2 .  Eduard Clos, MSW, [place]

## 2014-05-24 NOTE — Progress Notes (Signed)
[facility] PROGRESS NOTE  Eulia Hatcher MHD:[id number] DOB: 14-Feb-1921 DOA: 05/21/2014 PCP: [name], MD  Assessment/Plan: 80 y.o. female with history of atrial fibrillation and DVT on Coumadin, pacemaker placement, hypertension, hypothyroidism, pancytopenia was brought to the ER patient sustained a fall at her grandson's house  -Patient states that she was trying to get out of the car when her shoes got stuck and she fell face forward and hurt on the left side. Patient did not lose consciousness and did not have any chest pain or shortness of breath denies any nausea vomiting diarrhea palpitations. Patient has developed small left peri-orbital hematoma. In the ER patient had x-rays and CT scans which shows superior left pubic rami fracture.   1. Superior left pubic rami fracture with left periorbital hematoma status post mechanical fall   -Tramadol 25 mg every 6 hourly when necessary pain and patient does not want any narcotics. Cont PT -Physical therapy recs SNF placement, periorbital hematoma improved  2. Atrial fibrillation status post pacemaker placement  -reduced BB, Coumadin per pharmacy.  3. History of DVT - on Coumadin.  4. Hypertension - low BP so reduced BB; continue home medications.  5. Hypothyroidism - continue Synthroid.  6. Chronic pancytopenia - follow CBC    Code Status: full Family Communication:  D/w patient, called updated Downs,Judy Daughter [phone]  (indicate person spoken with, relationship, and if by phone, the number) Disposition Plan: SNF   Consultants:  none  Procedures:  none  Antibiotics:  none (indicate start date, and stop date if known)  HPI/Subjective: alert  Objective: Filed Vitals:   05/24/14 0549  BP: 114/61  Pulse: 85  Temp: 98.7 F (37.1 C)  Resp: 18    Intake/Output Summary (Last 24 hours) at 05/24/14 1107 Last data filed at 05/24/14 0854  Gross per 24 hour  Intake   1083 ml  Output      0  ml  Net   1083 ml   Filed Weights   05/22/14 0340  Weight: 44.8 kg (98 lb 12.3 oz)    Exam:   General:  alert  Cardiovascular: s1,s2 rrr  Respiratory: CTA BL  Abdomen: soft, nt,nd   Musculoskeletal: no LE edema   Data Reviewed: Basic Metabolic Panel:  Recent Labs Lab 05/22/14 0118 05/22/14 0430 05/23/14 0500  NA 138 137 137  K 3.7 3.7 4.9  CL 98 99 101  CO2  --  27 26  GLUCOSE 125* 170* 109*  BUN 17 17 18   CREATININE 0.60 0.59 0.54  CALCIUM  --  8.8 8.4   Liver Function Tests: No results found for this basename: AST, ALT, ALKPHOS, BILITOT, PROT, ALBUMIN,  in the last 168 hours No results found for this basename: LIPASE, AMYLASE,  in the last 168 hours No results found for this basename: AMMONIA,  in the last 168 hours CBC:  Recent Labs Lab 05/22/14 0035 05/22/14 0118 05/22/14 0430 05/23/14 0500  WBC 4.5  --  4.3 5.1  NEUTROABS 3.8  --   --   --   HGB 11.4* 12.6 10.9* 10.6*  HCT 34.2* 37.0 34.0* 32.8*  MCV 92.9  --  93.9 94.5  PLT 102*  --  105* 98*   Cardiac Enzymes: No results found for this basename: CKTOTAL, CKMB, CKMBINDEX, TROPONINI,  in the last 168 hours BNP (last 3 results) No results found for this basename: PROBNP,  in the last 8760 hours CBG: No results found for this basename: GLUCAP,  in the last  168 hours  No results found for this or any previous visit (from the past 240 hour(s)).   Studies: No results found.  Scheduled Meds: . cholecalciferol  1,000 Units Oral Daily  . furosemide  20 mg Oral Daily  . levothyroxine  50 mcg Oral QAC breakfast  . metoprolol succinate  50 mg Oral BID  . multivitamin with minerals  1 tablet Oral Daily  . sodium chloride  3 mL Intravenous Q12H  . warfarin  5 mg Oral ONCE-1800  . Warfarin - Pharmacist Dosing Inpatient   Does not apply q1800   Continuous Infusions:   Principal Problem:   Fracture of multiple pubic rami Active Problems:   Atrial fibrillation   Hypertension   Deep venous  thrombosis of lower leg    Time spent: >35 minutes     Kinnie Feil  [facility] Pager [phone]. If 7PM-7AM, please contact night-coverage at [email or web address], password [facility] 05/24/2014, 11:07 AM  LOS: 3 days

## 2014-05-24 NOTE — Progress Notes (Signed)
ANTICOAGULATION CONSULT NOTE - Follow Up Consult  Pharmacy Consult for coumadin Indication: atrial fibrillation  No Known Allergies  Patient Measurements: Weight: 98 lb 12.3 oz (44.8 kg)   Vital Signs: Temp: 98.7 F (37.1 C) (06/24 0549) Temp src: Oral (06/24 0549) BP: 114/61 mmHg (06/24 0549) Pulse Rate: 85 (06/24 0549)  Labs:  Recent Labs  05/21/14 2118  05/22/14 0035 05/22/14 0118 05/22/14 0430 05/23/14 0500 05/24/14 0530  HGB  --   < > 11.4* 12.6 10.9* 10.6*  --   HCT  --   < > 34.2* 37.0 34.0* 32.8*  --   PLT  --   --  102*  --  105* 98*  --   LABPROT 27.3*  --   --   --   --  31.1* 23.4*  INR 2.64*  --   --   --   --  3.14* 2.16*  CREATININE  --   --   --  0.60 0.59 0.54  --   < > = values in this interval not displayed.  The CrCl is unknown because both a height and weight (above a minimum accepted value) are required for this calculation.  Assessment: Patient is a 80 y.o. female here s/p fall with left pubic rami fracture with left periorbital hematoma on coumadin PTA for afib. Coumadin dose held yesterday d/t supra-therapeutic INR level.  INR now down to 2.16.  No new bleeding documented.   Goal of Therapy:  INR 2-3    Plan:  1) coumadin 5mg  PO x1 today  Pham, Anh P 05/24/2014,10:51 AM

## 2014-05-25 DIAGNOSIS — M6281 Muscle weakness (generalized): Secondary | ICD-10-CM | POA: Diagnosis not present

## 2014-05-25 DIAGNOSIS — I495 Sick sinus syndrome: Secondary | ICD-10-CM | POA: Diagnosis present

## 2014-05-25 DIAGNOSIS — R269 Unspecified abnormalities of gait and mobility: Secondary | ICD-10-CM | POA: Diagnosis not present

## 2014-05-25 DIAGNOSIS — R5383 Other fatigue: Secondary | ICD-10-CM | POA: Diagnosis not present

## 2014-05-25 DIAGNOSIS — S329XXA Fracture of unspecified parts of lumbosacral spine and pelvis, initial encounter for closed fracture: Secondary | ICD-10-CM | POA: Diagnosis not present

## 2014-05-25 DIAGNOSIS — Z86718 Personal history of other venous thrombosis and embolism: Secondary | ICD-10-CM | POA: Diagnosis not present

## 2014-05-25 DIAGNOSIS — R4701 Aphasia: Secondary | ICD-10-CM | POA: Diagnosis present

## 2014-05-25 DIAGNOSIS — I10 Essential (primary) hypertension: Secondary | ICD-10-CM | POA: Diagnosis present

## 2014-05-25 DIAGNOSIS — I82819 Embolism and thrombosis of superficial veins of unspecified lower extremities: Secondary | ICD-10-CM | POA: Diagnosis not present

## 2014-05-25 DIAGNOSIS — Z950 Presence of cardiac pacemaker: Secondary | ICD-10-CM | POA: Diagnosis not present

## 2014-05-25 DIAGNOSIS — S72009D Fracture of unspecified part of neck of unspecified femur, subsequent encounter for closed fracture with routine healing: Secondary | ICD-10-CM | POA: Diagnosis not present

## 2014-05-25 DIAGNOSIS — R64 Cachexia: Secondary | ICD-10-CM | POA: Diagnosis present

## 2014-05-25 DIAGNOSIS — I369 Nonrheumatic tricuspid valve disorder, unspecified: Secondary | ICD-10-CM | POA: Diagnosis not present

## 2014-05-25 DIAGNOSIS — D61818 Other pancytopenia: Secondary | ICD-10-CM | POA: Diagnosis not present

## 2014-05-25 DIAGNOSIS — I4891 Unspecified atrial fibrillation: Secondary | ICD-10-CM | POA: Diagnosis present

## 2014-05-25 DIAGNOSIS — Z79899 Other long term (current) drug therapy: Secondary | ICD-10-CM | POA: Diagnosis not present

## 2014-05-25 DIAGNOSIS — E039 Hypothyroidism, unspecified: Secondary | ICD-10-CM | POA: Diagnosis present

## 2014-05-25 DIAGNOSIS — G8190 Hemiplegia, unspecified affecting unspecified side: Secondary | ICD-10-CM | POA: Diagnosis present

## 2014-05-25 DIAGNOSIS — R21 Rash and other nonspecific skin eruption: Secondary | ICD-10-CM | POA: Diagnosis not present

## 2014-05-25 DIAGNOSIS — R918 Other nonspecific abnormal finding of lung field: Secondary | ICD-10-CM | POA: Diagnosis not present

## 2014-05-25 DIAGNOSIS — R29818 Other symptoms and signs involving the nervous system: Secondary | ICD-10-CM | POA: Diagnosis not present

## 2014-05-25 DIAGNOSIS — I6340 Cerebral infarction due to embolism of unspecified cerebral artery: Secondary | ICD-10-CM | POA: Diagnosis present

## 2014-05-25 DIAGNOSIS — R29898 Other symptoms and signs involving the musculoskeletal system: Secondary | ICD-10-CM | POA: Diagnosis not present

## 2014-05-25 DIAGNOSIS — R791 Abnormal coagulation profile: Secondary | ICD-10-CM | POA: Diagnosis present

## 2014-05-25 DIAGNOSIS — S32509A Unspecified fracture of unspecified pubis, initial encounter for closed fracture: Secondary | ICD-10-CM | POA: Diagnosis not present

## 2014-05-25 DIAGNOSIS — Z681 Body mass index (BMI) 19 or less, adult: Secondary | ICD-10-CM | POA: Diagnosis not present

## 2014-05-25 DIAGNOSIS — F411 Generalized anxiety disorder: Secondary | ICD-10-CM | POA: Diagnosis present

## 2014-05-25 DIAGNOSIS — R5381 Other malaise: Secondary | ICD-10-CM | POA: Diagnosis not present

## 2014-05-25 DIAGNOSIS — Z7901 Long term (current) use of anticoagulants: Secondary | ICD-10-CM | POA: Diagnosis not present

## 2014-05-25 DIAGNOSIS — I6789 Other cerebrovascular disease: Secondary | ICD-10-CM | POA: Diagnosis not present

## 2014-05-25 DIAGNOSIS — R471 Dysarthria and anarthria: Secondary | ICD-10-CM | POA: Diagnosis present

## 2014-05-25 DIAGNOSIS — W19XXXA Unspecified fall, initial encounter: Secondary | ICD-10-CM | POA: Diagnosis not present

## 2014-05-25 DIAGNOSIS — R4789 Other speech disturbances: Secondary | ICD-10-CM | POA: Diagnosis not present

## 2014-05-25 DIAGNOSIS — Z66 Do not resuscitate: Secondary | ICD-10-CM | POA: Diagnosis present

## 2014-05-25 DIAGNOSIS — R1310 Dysphagia, unspecified: Secondary | ICD-10-CM | POA: Diagnosis present

## 2014-05-25 DIAGNOSIS — I6350 Cerebral infarction due to unspecified occlusion or stenosis of unspecified cerebral artery: Secondary | ICD-10-CM | POA: Diagnosis not present

## 2014-05-25 LAB — PROTIME-INR
INR: 1.73 — ABNORMAL HIGH (ref 0.00–1.49)
Prothrombin Time: 20.3 seconds — ABNORMAL HIGH (ref 11.6–15.2)

## 2014-05-25 MED ORDER — TRAMADOL HCL 50 MG PO TABS: 25.0000 mg | ORAL_TABLET | Freq: Four times a day (QID) | ORAL | Status: DC | PRN

## 2014-05-25 MED ORDER — ACETAMINOPHEN 325 MG PO TABS: 325.0000 mg | ORAL_TABLET | Freq: Four times a day (QID) | ORAL | Status: AC | PRN

## 2014-05-25 MED ORDER — TRAZODONE 25 MG HALF TABLET: 25.0000 mg | ORAL_TABLET | Freq: Every evening | ORAL | Status: DC | PRN

## 2014-05-25 MED ORDER — ALPRAZOLAM 0.25 MG PO TABS: 0.2500 mg | ORAL_TABLET | Freq: Three times a day (TID) | ORAL | Status: DC | PRN

## 2014-05-25 NOTE — Progress Notes (Addendum)
PTAR scheduled for 12:30pm at RN request. Discharge packet placed on chart. Pt and daughter informed of discharge. CSW sent discharge summary to facility.   Addendum: Received call from [facility] that they have a private room opening for pt at 2pm. CSW pushed ambulance transport to 2pm, informed pt's daughter and the RN. Discharge packet it on pt's chart. CSW signing off.  Ky Barban, MSW, [facility] Clinical Social Worker [phone]

## 2014-05-25 NOTE — Discharge Summary (Signed)
Physician Discharge Summary  Christy Miranda ONG:[id number] DOB: 1921-03-11 DOA: 05/21/2014  PCP: [name], MD  Admit date: 05/21/2014 Discharge date: 05/25/2014  Time spent: >35 minutes  Recommendations for Outpatient Follow-up:  SNF F/u with PCP in 1 week post discharge  F/u with INR in 2-3 days at SNF  Discharge Diagnoses:  Principal Problem:   Fracture of multiple pubic rami Active Problems:   Atrial fibrillation   Hypertension   Deep venous thrombosis of lower leg   Discharge Condition: stable   Diet recommendation: low sodium  Filed Weights   05/22/14 0340  Weight: 44.8 kg (98 lb 12.3 oz)    History of present illness:  80 y.o. female with history of atrial fibrillation and DVT on Coumadin, pacemaker placement, hypertension, hypothyroidism, pancytopenia, anxiety was brought to the ER patient sustained a fall at her grandson's house  -Patient states that she was trying to get out of the car when her shoes got stuck and she fell face forward and hurt on the left side. Patient did not lose consciousness and did not have any chest pain or shortness of breath denies any nausea vomiting diarrhea palpitations. Patient has developed small left peri-orbital hematoma. In the ER patient had x-rays and CT scans which shows superior left pubic rami fracture.    Hospital Course: 1. Superior left pubic rami fracture with left periorbital hematoma status post mechanical fall  -Tramadol 25 mg every 6 hourly when necessary pain and patient does not want any narcotics. Cont PT/SNF  -Physical therapy recs SNF placement, periorbital hematoma improved, no significant nose bleeding, cont to monitor clinically    2. Atrial fibrillation status post pacemaker placement  -cont BB, cont coumadin; d/w patient recommended to consider to stop anticoagulation due to risk of fall/trauma, bleeding   -f/u INT in 2-3 days at SNF 3. History of DVT - on Coumadin.  4. Hypertension -continue home  medications.  5. Hypothyroidism - continue Synthroid.  6. Chronic pancytopenia - follow CBC  7. Anxiety on xanax prn    Procedures:  none (i.e. Studies not automatically included, echos, thoracentesis, etc; not x-rays)  Consultations:  none  Discharge Exam: Filed Vitals:   05/25/14 0527  BP: 128/57  Pulse: 101  Temp: 98.4 F (36.9 C)  Resp: 18    General: alert Cardiovascular: s1,s2 irregular  Respiratory: CTA BL  Discharge Instructions  Discharge Instructions   Diet - low sodium heart healthy    Complete by:  As directed      Discharge instructions    Complete by:  As directed   Please follow up with primary care doctor in 1 week  Please follow up with INR checks in 3 days     Increase activity slowly    Complete by:  As directed             Medication List         acetaminophen 325 MG tablet  Commonly known as:  TYLENOL  Take 1 tablet (325 mg total) by mouth every 6 (six) hours as needed for mild pain, fever or headache.     ALPRAZolam 0.25 MG tablet  Commonly known as:  XANAX  Take 1 tablet (0.25 mg total) by mouth 3 (three) times daily as needed for anxiety. For anxiety     cholecalciferol 1000 UNITS tablet  Commonly known as:  VITAMIN D  Take 1,000 Units by mouth daily.     furosemide 20 MG tablet  Commonly known as:  LASIX  Take 20 mg by mouth daily.     levothyroxine 50 MCG tablet  Commonly known as:  SYNTHROID, LEVOTHROID  Take 1 tablet (50 mcg total) by mouth daily.     metoprolol succinate 25 MG 24 hr tablet  Commonly known as:  TOPROL-XL  Take 3 tablets (75 mg total) by mouth 2 (two) times daily.     multivitamin with minerals Tabs tablet  Take 1 tablet by mouth daily.     traMADol 50 MG tablet  Commonly known as:  ULTRAM  Take 0.5 tablets (25 mg total) by mouth every 6 (six) hours as needed for severe pain.     traZODone 25 mg Tabs tablet  Commonly known as:  DESYREL  Take 0.5 tablets (25 mg total) by mouth at bedtime as  needed for sleep.     warfarin 5 MG tablet  Commonly known as:  COUMADIN  Take 5-7.5 mg by mouth See admin instructions. Take 1 tablet (5mg ) Monday and Friday. Take 1.5 tablets (7.5mg ) all other days.       No Known Allergies     Follow-up Information   Follow up with [name], MD In 1 week.   Specialty:  Family Medicine   Contact information:   [place] [address] [phone]        The results of significant diagnostics from this hospitalization (including imaging, microbiology, ancillary and laboratory) are listed below for reference.    Significant Diagnostic Studies: Dg Chest 2 View  05/21/2014   CLINICAL DATA:  Right lung opacity demonstrated in cervical spine CT.  EXAM: CHEST  2 VIEW  COMPARISON:  11/12/2013  FINDINGS: Cardiac pacemaker. Cardiac enlargement without vascular congestion. Emphysematous changes and scattered fibrosis in the lungs. There is chronic appearing elevation of the right hemidiaphragm. Linear opacity in the right mid lung probably represents either atelectasis or fluid in the minor fissure. Degenerative changes and demineralization of the thoracic spine. Calcified and tortuous aorta. Thoracolumbar scoliosis.  IMPRESSION: Chronic elevation of right hemidiaphragm with linear opacity in the right mid lung likely representing atelectasis or fluid in the minor fissure. Cardiac enlargement.   Electronically Signed   By: Lucienne Capers M.D.   On: 05/21/2014 23:16   Dg Hip Complete Left  05/21/2014   CLINICAL DATA:  Fall. Left lateral shoulder tenderness and reduced range of motion. Left lateral hip pain.  EXAM: LEFT HIP - COMPLETE 2+ VIEW  COMPARISON:  None  FINDINGS: Bony demineralization. Loss of intervertebral disc height in the lower lumbar spine.  I do not observe a fracture of the left proximal femur. No regional pelvic fracture as identified.  IMPRESSION: 1. No fracture identified. If the patient is unable to bear  weight or if otherwise indicated, cross-sectional imaging may be helpful for further workup. 2. Lower lumbar spondylosis and degenerative disc disease. 3. Bony demineralization.   Electronically Signed   By: Sherryl Barters M.D.   On: 05/21/2014 22:24   Ct Head Wo Contrast  05/21/2014   CLINICAL DATA:  Patient fell to ground from a standing position. Bruise to the left eye and cheek. On Coumadin.  EXAM: CT HEAD WITHOUT CONTRAST  CT MAXILLOFACIAL WITHOUT CONTRAST  CT CERVICAL SPINE WITHOUT CONTRAST  TECHNIQUE: Multidetector CT imaging of the head, cervical spine, and maxillofacial structures were performed using the standard protocol without intravenous contrast. Multiplanar CT image reconstructions of the cervical spine and maxillofacial structures were also generated.  COMPARISON:  CT head 11/28/2013.  CT angio head and neck 05/17/2013.  FINDINGS: CT HEAD FINDINGS  Diffuse cerebral atrophy. Minimal ventricular dilatation consistent with central atrophy. Low-attenuation changes in the deep white matter consistent with small vessel ischemia. No mass effect or midline shift. No abnormal extra-axial fluid collections. Gray-white matter junctions are distinct. Basal cisterns are not effaced. No evidence of acute intracranial hemorrhage. No depressed skull fractures. Mastoid air cells are not opacified.  CT MAXILLOFACIAL FINDINGS  Postoperative changes in the globes. Mild left periorbital and facial subcutaneous soft tissues hematomas. Globes and extraocular muscles appear otherwise symmetrical and intact. Mucosal thickening in the right maxillary antrum. No acute air-fluid levels noted in the paranasal sinuses. The frontal bones, orbital rims, zygomatic arches, nasal bones, nasal septum, maxilla, mandibles, temporomandibular joints, and pterygoid plates appear intact. No displaced fractures identified.  CT CERVICAL SPINE FINDINGS  Diffuse bone demineralization. There is accentuation of the usual cervical lordosis  likely associated with thoracic kyphosis. Diffuse degenerative changes throughout the cervical spine with narrowed interspaces and prominent endplate hypertrophic changes most prominently at C4-5, C5-6, and C6-7 levels. Degenerative changes throughout the facet joints and at C1-2. Prominent disc osteophyte complexes noted at C4-5, C5-6, and C6-7 levels. Degenerative changes with bridging anterior osteophytes causing near ankylosis of the visualized upper thoracic spine. No vertebral compression deformities. No prevertebral soft tissue swelling. Bone cortex and trabecular architecture appear intact. No focal bone lesion or bone destruction is appreciated.  Incidental note of rounded opacity in the right mid lung on the inferior most slice. Right lung pathology such as mass or infiltration cannot be excluded. Consider chest x-ray correlation.  IMPRESSION: No acute intracranial abnormalities. Chronic atrophy and small vessel ischemic changes.  Left periorbital and facial soft tissue swelling/hematoma. No displaced fractures identified in the facial bones or orbits.  Diffuse degenerative changes in the cervical spine. No displaced fractures identified.  Indeterminate opacity in the right lung. Chest x-ray correlation recommended.   Electronically Signed   By: Lucienne Capers M.D.   On: 05/21/2014 22:08   Ct Cervical Spine Wo Contrast  05/21/2014   CLINICAL DATA:  Patient fell to ground from a standing position. Bruise to the left eye and cheek. On Coumadin.  EXAM: CT HEAD WITHOUT CONTRAST  CT MAXILLOFACIAL WITHOUT CONTRAST  CT CERVICAL SPINE WITHOUT CONTRAST  TECHNIQUE: Multidetector CT imaging of the head, cervical spine, and maxillofacial structures were performed using the standard protocol without intravenous contrast. Multiplanar CT image reconstructions of the cervical spine and maxillofacial structures were also generated.  COMPARISON:  CT head 11/28/2013.  CT angio head and neck 05/17/2013.  FINDINGS: CT  HEAD FINDINGS  Diffuse cerebral atrophy. Minimal ventricular dilatation consistent with central atrophy. Low-attenuation changes in the deep white matter consistent with small vessel ischemia. No mass effect or midline shift. No abnormal extra-axial fluid collections. Gray-white matter junctions are distinct. Basal cisterns are not effaced. No evidence of acute intracranial hemorrhage. No depressed skull fractures. Mastoid air cells are not opacified.  CT MAXILLOFACIAL FINDINGS  Postoperative changes in the globes. Mild left periorbital and facial subcutaneous soft tissues hematomas. Globes and extraocular muscles appear otherwise symmetrical and intact. Mucosal thickening in the right maxillary antrum. No acute air-fluid levels noted in the paranasal sinuses. The frontal bones, orbital rims, zygomatic arches, nasal bones, nasal septum, maxilla, mandibles, temporomandibular joints, and pterygoid plates appear intact. No displaced fractures identified.  CT CERVICAL SPINE FINDINGS  Diffuse bone demineralization. There is accentuation of the usual cervical lordosis likely associated with thoracic kyphosis. Diffuse  degenerative changes throughout the cervical spine with narrowed interspaces and prominent endplate hypertrophic changes most prominently at C4-5, C5-6, and C6-7 levels. Degenerative changes throughout the facet joints and at C1-2. Prominent disc osteophyte complexes noted at C4-5, C5-6, and C6-7 levels. Degenerative changes with bridging anterior osteophytes causing near ankylosis of the visualized upper thoracic spine. No vertebral compression deformities. No prevertebral soft tissue swelling. Bone cortex and trabecular architecture appear intact. No focal bone lesion or bone destruction is appreciated.  Incidental note of rounded opacity in the right mid lung on the inferior most slice. Right lung pathology such as mass or infiltration cannot be excluded. Consider chest x-ray correlation.  IMPRESSION: No  acute intracranial abnormalities. Chronic atrophy and small vessel ischemic changes.  Left periorbital and facial soft tissue swelling/hematoma. No displaced fractures identified in the facial bones or orbits.  Diffuse degenerative changes in the cervical spine. No displaced fractures identified.  Indeterminate opacity in the right lung. Chest x-ray correlation recommended.   Electronically Signed   By: Lucienne Capers M.D.   On: 05/21/2014 22:08   Ct Hip Left Wo Contrast  05/22/2014   CLINICAL DATA:  Patient fell to ground from a standing position. Pain.  EXAM: CT OF THE LEFT HIP WITHOUT CONTRAST  TECHNIQUE: Multidetector CT imaging was performed according to the standard protocol. Multiplanar CT image reconstructions were also generated.  COMPARISON:  Left hip 05/21/2014  FINDINGS: Prominent diffuse bone demineralization. Mild degenerative changes in the left hip. Left hip appears intact. No displaced fractures are identified. There is focal cortical irregularity in the superior left pubic rami as at the juncture point to the anterior acetabulum. This suggests a nondisplaced fracture. Inferior pubic ramus appears intact. No displacement of the symphysis pubis. No significant soft tissue hematoma or E fusion.  IMPRESSION: Cortical irregularity suggesting nondisplaced fracture of the superior left pubic ramus. Left hip appears intact without displaced fracture identified. Degenerative change in the left hip. Diffuse bone demineralization.   Electronically Signed   By: Lucienne Capers M.D.   On: 05/22/2014 00:15   Dg Shoulder Left  05/21/2014   CLINICAL DATA:  Left lateral shoulder pain with limited range of motion after a fall tonight.  EXAM: LEFT SHOULDER - 2+ VIEW  COMPARISON:  None.  FINDINGS: Diffuse bone demineralization. There is no evidence of fracture or dislocation. There is no evidence of arthropathy or other focal bone abnormality. Soft tissues are unremarkable. Degenerative changes in the spine.   IMPRESSION: Negative.   Electronically Signed   By: Lucienne Capers M.D.   On: 05/21/2014 22:21   Ct Maxillofacial Wo Cm  05/21/2014   CLINICAL DATA:  Patient fell to ground from a standing position. Bruise to the left eye and cheek. On Coumadin.  EXAM: CT HEAD WITHOUT CONTRAST  CT MAXILLOFACIAL WITHOUT CONTRAST  CT CERVICAL SPINE WITHOUT CONTRAST  TECHNIQUE: Multidetector CT imaging of the head, cervical spine, and maxillofacial structures were performed using the standard protocol without intravenous contrast. Multiplanar CT image reconstructions of the cervical spine and maxillofacial structures were also generated.  COMPARISON:  CT head 11/28/2013.  CT angio head and neck 05/17/2013.  FINDINGS: CT HEAD FINDINGS  Diffuse cerebral atrophy. Minimal ventricular dilatation consistent with central atrophy. Low-attenuation changes in the deep white matter consistent with small vessel ischemia. No mass effect or midline shift. No abnormal extra-axial fluid collections. Gray-white matter junctions are distinct. Basal cisterns are not effaced. No evidence of acute intracranial hemorrhage. No depressed skull fractures. Mastoid air cells  are not opacified.  CT MAXILLOFACIAL FINDINGS  Postoperative changes in the globes. Mild left periorbital and facial subcutaneous soft tissues hematomas. Globes and extraocular muscles appear otherwise symmetrical and intact. Mucosal thickening in the right maxillary antrum. No acute air-fluid levels noted in the paranasal sinuses. The frontal bones, orbital rims, zygomatic arches, nasal bones, nasal septum, maxilla, mandibles, temporomandibular joints, and pterygoid plates appear intact. No displaced fractures identified.  CT CERVICAL SPINE FINDINGS  Diffuse bone demineralization. There is accentuation of the usual cervical lordosis likely associated with thoracic kyphosis. Diffuse degenerative changes throughout the cervical spine with narrowed interspaces and prominent endplate  hypertrophic changes most prominently at C4-5, C5-6, and C6-7 levels. Degenerative changes throughout the facet joints and at C1-2. Prominent disc osteophyte complexes noted at C4-5, C5-6, and C6-7 levels. Degenerative changes with bridging anterior osteophytes causing near ankylosis of the visualized upper thoracic spine. No vertebral compression deformities. No prevertebral soft tissue swelling. Bone cortex and trabecular architecture appear intact. No focal bone lesion or bone destruction is appreciated.  Incidental note of rounded opacity in the right mid lung on the inferior most slice. Right lung pathology such as mass or infiltration cannot be excluded. Consider chest x-ray correlation.  IMPRESSION: No acute intracranial abnormalities. Chronic atrophy and small vessel ischemic changes.  Left periorbital and facial soft tissue swelling/hematoma. No displaced fractures identified in the facial bones or orbits.  Diffuse degenerative changes in the cervical spine. No displaced fractures identified.  Indeterminate opacity in the right lung. Chest x-ray correlation recommended.   Electronically Signed   By: Lucienne Capers M.D.   On: 05/21/2014 22:08    Microbiology: No results found for this or any previous visit (from the past 240 hour(s)).   Labs: Basic Metabolic Panel:  Recent Labs Lab 05/22/14 0118 05/22/14 0430 05/23/14 0500  NA 138 137 137  K 3.7 3.7 4.9  CL 98 99 101  CO2  --  27 26  GLUCOSE 125* 170* 109*  BUN 17 17 18   CREATININE 0.60 0.59 0.54  CALCIUM  --  8.8 8.4   Liver Function Tests: No results found for this basename: AST, ALT, ALKPHOS, BILITOT, PROT, ALBUMIN,  in the last 168 hours No results found for this basename: LIPASE, AMYLASE,  in the last 168 hours No results found for this basename: AMMONIA,  in the last 168 hours CBC:  Recent Labs Lab 05/22/14 0035 05/22/14 0118 05/22/14 0430 05/23/14 0500  WBC 4.5  --  4.3 5.1  NEUTROABS 3.8  --   --   --   HGB  11.4* 12.6 10.9* 10.6*  HCT 34.2* 37.0 34.0* 32.8*  MCV 92.9  --  93.9 94.5  PLT 102*  --  105* 98*   Cardiac Enzymes: No results found for this basename: CKTOTAL, CKMB, CKMBINDEX, TROPONINI,  in the last 168 hours BNP: BNP (last 3 results) No results found for this basename: PROBNP,  in the last 8760 hours CBG: No results found for this basename: GLUCAP,  in the last 168 hours     Signed:  Kinnie Feil  [facility] 05/25/2014, 8:54 AM

## 2014-05-25 NOTE — Discharge Instructions (Signed)
Warfarin: What You Need to Know Warfarin is an anticoagulant. Anticoagulants help prevent the formation of blood clots. They also help stop the growth of blood clots. Warfarin is sometimes referred to as a "blood thinner."  Normally, when body tissues are cut or damaged, the blood clots in order to prevent blood loss. Sometimes clots form inside your blood vessels and obstruct the flow of blood through your circulatory system (thrombosis). These clots may travel through your bloodstream and become lodged in smaller blood vessels in your brain, which can cause a stroke, or in your lungs (pulmonary embolism). WHO SHOULD USE WARFARIN? Warfarin is prescribed for people at risk of developing harmful blood clots:  People with surgically implanted mechanical heart valves, irregular heart rhythms called atrial fibrillation, and certain clotting disorders.  People who have developed harmful blood clotting in the past, including those who have had a stroke or a pulmonary embolism, or thrombosis in their legs (deep vein thrombosis [DVT]).  People with an existing blood clot, such as a pulmonary embolism. WARFARIN DOSING Warfarin tablets come in different strengths. Each tablet strength is a different color, with the amount of warfarin (in milligrams) clearly printed on the tablet. If the color of your tablet is different than usual when you receive a new prescription, report it immediately to your pharmacist or health care provider. WARFARIN MONITORING The goal of warfarin therapy is to lessen the clotting tendency of blood but not prevent clotting completely. Your health care provider will monitor the anticoagulation effect of warfarin closely and adjust your dose as needed. For your safety, blood tests called prothrombin time (PT) or international normalized ratio (INR) are used to measure the effects of warfarin. Both of these tests can be done with a finger stick or a blood draw. The longer it takes the  blood to clot, the higher the PT or INR. Your health care provider will inform you of your "target" PT or INR range. If, at any time, your PT or INR is above the target range, there is a risk of bleeding. If your PT or INR is below the target range, there is a risk of clotting. Whether you are started on warfarin while you are in the hospital or in your health care provider's office, you will need to have your PT or INR checked within one week of starting the medicine. Initially, some people are asked to have their PT or INR checked as much as twice a week. Once you are on a stable maintenance dose, the PT or INR is checked less often, usually once every 2 to 4 weeks. The warfarin dose may be adjusted if the PT or INR is not within the target range. It is important to keep all laboratory and health care provider follow-up appointments. Not keeping appointments could result in a chronic or permanent injury, pain, or disability because warfarin is a medicine that requires close monitoring. WHAT ARE THE SIDE EFFECTS OF WARFARIN?  Too much warfarin can cause bleeding (hemorrhage) from any part of the body. This may include bleeding from the gums, blood in the urine, bloody or dark stools, a nosebleed that is not easily stopped, coughing up blood, or vomiting blood.  Too little warfarin can increase the risk of blood clots.  Too little or too much warfarin can also increase the risk of a stroke.  Warfarin use may cause a skin rash or irritation, an unusual fever, continual nausea or stomach upset, or severe pain in your joints or back.   SPECIAL PRECAUTIONS WHILE TAKING WARFARIN Warfarin should be taken exactly as directed. It is very important to take warfarin as directed since bleeding or blood clots could result in chronic or permanent injury, pain, or disability.  Take your medicine at the same time every day. If you forget to take your dose, you can take it if it is within 6 hours of when it was  due.  Do not change the dose of warfarin on your own to make up for missed or extra doses.  If you miss more than 2 doses in a row, you should contact your health care provider for advice. Avoid situations that cause bleeding. You may have a tendency to bleed more easily than usual while taking warfarin. The following actions can limit bleeding:  Using a softer toothbrush.  Flossing with waxed floss rather than unwaxed floss.  Shaving with an electric razor rather than a blade.  Limiting the use of sharp objects.  Avoiding potentially harmful activities, such as contact sports. Warfarin and Pregnancy or Breastfeeding  Warfarin is not advised during the first trimester of pregnancy due to an increased risk of birth defects. In certain situations, a woman may take warfarin after her first trimester of pregnancy. A woman who becomes pregnant or plans to become pregnant while taking warfarin should notify her health care provider immediately.  Although warfarin does not pass into breast milk, a woman who wishes to breastfeed while taking warfarin should also consult with her health care provider. Alcohol, Smoking, and Illicit Drug Use  Alcohol affects how warfarin works in the body. It is best to avoid alcoholic drinks or consume very small amounts while taking warfarin. In general, alcohol intake should be limited to 1 oz (30 mL) of liquor, 6 oz (180 mL) of wine, or 12 oz (360 mL) of beer each day. Notify your health care provider if you change your alcohol intake.  Smoking affects how warfarin works. It is best to avoid smoking while taking warfarin. Notify your health care provider if you change your smoking habits.  It is best to avoid all illicit drugs while taking warfarin since there are few studies that show how warfarin interacts with these drugs. Other Medicines and Dietary Supplements Many prescription and over-the-counter medicines can interfere with warfarin. Be sure all of your  health care providers know you are taking warfarin. Notify your health care provider who prescribed warfarin for you or your pharmacist before starting or stopping any new medicines, including over-the-counter vitamins, dietary supplements, and pain medicines. Your warfarin dose may need to be adjusted. Some common over-the-counter medicines that may increase the risk of bleeding while taking warfarin include:   Acetaminophen.  Aspirin.  Nonsteroidal anti-inflammatory medicines (NSAIDs), such as ibuprofen or naproxen.  Vitamin E. Dietary Considerations  Foods that have moderate or high amounts of vitamin K can interfere with warfarin. Avoid major changes in your diet or notify your health care provider before changing your diet. Eat a consistent amount of foods that have moderate or high amounts of vitamin K. Eating less foods containing vitamin K can increase the risk of bleeding. Eating more foods containing vitamin K can increase the risk of blood clots. Additional questions about dietary considerations can be discussed with a dietitian. Foods that are very high in vitamin K:  Greens, such as Swiss chard and beet, collard, mustard, or turnip greens (fresh or frozen, cooked).  Kale (fresh or frozen, cooked).  Parsley (raw).  Spinach (cooked). Foods that are high   in vitamin K:  Asparagus (frozen, cooked).  Beans, green (frozen, cooked).  Broccoli.  Bok choy (cooked).  Brussels sprouts (fresh or frozen, cooked).  Cabbage (cooked).   Coleslaw. Foods that are moderately high in vitamin K:  Blueberries.  Black-eyed peas.  Endive (raw).  Green leaf lettuce (raw).  Green scallions (raw).  Kale (raw).  Okra (frozen, cooked).  Plantains (fried).  Romaine lettuce (raw).  Sauerkraut (canned).  Spinach (raw). CALL YOUR CLINIC OR HEALTH CARE PROVIDER IF YOU:  Plan to have any surgery or procedure.  Feel sick, especially if you have diarrhea or  vomiting.  Experience or anticipate any major changes in your diet.  Start or stop a prescription or over-the-counter medicine.  Become, plan to become, or think you may be pregnant.  Are having heavier than usual menstrual periods.  Have had a fall, accident, or any symptoms of bleeding or unusual bruising.  Develop an unusual fever. CALL 911 IN THE [place] OR GO TO THE EMERGENCY DEPARTMENT IF YOU:   Think you may be having an allergic reaction to warfarin. The signs of an allergic reaction could include itching, rash, hives, swelling, chest tightness, or trouble breathing.  See signs of blood in your urine. The signs could include reddish, pinkish, or tea-colored urine.  See signs of blood in your stools. The signs could include bright red or black stools.  Vomit or cough up blood. In these instances, the blood could have either a bright red or a "coffee-grounds" appearance.  Have bleeding that will not stop after applying pressure for 30 minutes such as cuts, nosebleeds, or other injuries.  Have severe pain in your joints or back.  Have a new and severe headache.  Have sudden weakness or numbness of your face, arm, or leg, especially on one side of your body.  Have sudden confusion or trouble understanding.  Have sudden trouble seeing in one or both eyes.  Have sudden trouble walking, dizziness, loss of balance, or coordination.  Have trouble speaking or understanding (aphasia). Document Released: 11/17/2005 Document Revised: 11/22/2013 Document Reviewed: 05/13/2013 ExitCare Patient Information 2015 [employer]. This information is not intended to replace advice given to you by your health care provider. Make sure you discuss any questions you have with your health care provider.  

## 2014-05-26 DIAGNOSIS — S329XXA Fracture of unspecified parts of lumbosacral spine and pelvis, initial encounter for closed fracture: Secondary | ICD-10-CM | POA: Diagnosis not present

## 2014-05-26 DIAGNOSIS — S32509A Unspecified fracture of unspecified pubis, initial encounter for closed fracture: Secondary | ICD-10-CM | POA: Diagnosis not present

## 2014-05-26 NOTE — ED Provider Notes (Signed)
History/physical exam/procedure(s) were performed by non-physician practitioner and as supervising physician I was immediately available for consultation/collaboration. I have reviewed all notes and am in agreement with care and plan.   Shaune Pollack, MD 05/26/14 [phone]

## 2014-06-01 ENCOUNTER — Telehealth: Payer: Self-pay

## 2014-06-01 NOTE — Telephone Encounter (Signed)
Received message patient wanted me to call her cell # [phone].She is at [facility].Patient called no answer.[place].

## 2014-06-05 ENCOUNTER — Telehealth: Payer: Self-pay | Admitting: Cardiology

## 2014-06-05 NOTE — Telephone Encounter (Signed)
New Message  Pt called. Request a call back from Nurse [name] to discuss her pro time.  Please call

## 2014-06-05 NOTE — Telephone Encounter (Signed)
Patient called no answer.LMTC. 

## 2014-06-07 ENCOUNTER — Encounter: Payer: Self-pay | Admitting: Internal Medicine

## 2014-06-09 NOTE — Telephone Encounter (Signed)
Returned call to patient 06/08/14 she was unable to talk due to daughter visiting.Stated she wanted to ask me some questions and she will call me back 06/09/14.  Received call from patient this afternoon 06/09/14.She stated she is having trouble with INR.Stated INR was 4.1 on 06/08/14.Stated they are holding coumadin for 2 days and will have INR check 06/10/14.Patient is not happy at [facility].Stated she wanted Coumadin Clinic at our [facility] to know and would like their opinion.Advised she will have to follow the advice of [facility] since they are managing INRs at present.Patient was reassured.Will let our coumadin clinic know.

## 2014-06-12 ENCOUNTER — Ambulatory Visit: Payer: Medicare Other | Admitting: Podiatry

## 2014-06-13 ENCOUNTER — Inpatient Hospital Stay (HOSPITAL_COMMUNITY): Payer: Medicare Other

## 2014-06-13 ENCOUNTER — Inpatient Hospital Stay (HOSPITAL_COMMUNITY)
Admission: EM | Admit: 2014-06-13 | Discharge: 2014-06-19 | DRG: 062 | Disposition: A | Discharge status: Rehabilitation Facility | Payer: Medicare Other | Attending: Neurology | Admitting: Neurology

## 2014-06-13 ENCOUNTER — Emergency Department (HOSPITAL_COMMUNITY): Payer: Medicare Other

## 2014-06-13 ENCOUNTER — Encounter (HOSPITAL_COMMUNITY): Payer: Self-pay | Admitting: Emergency Medicine

## 2014-06-13 DIAGNOSIS — I6340 Cerebral infarction due to embolism of unspecified cerebral artery: Principal | ICD-10-CM | POA: Diagnosis present

## 2014-06-13 DIAGNOSIS — E639 Nutritional deficiency, unspecified: Secondary | ICD-10-CM

## 2014-06-13 DIAGNOSIS — R29818 Other symptoms and signs involving the nervous system: Secondary | ICD-10-CM | POA: Diagnosis not present

## 2014-06-13 DIAGNOSIS — G8190 Hemiplegia, unspecified affecting unspecified side: Secondary | ICD-10-CM | POA: Diagnosis present

## 2014-06-13 DIAGNOSIS — E039 Hypothyroidism, unspecified: Secondary | ICD-10-CM | POA: Diagnosis present

## 2014-06-13 DIAGNOSIS — Z79899 Other long term (current) drug therapy: Secondary | ICD-10-CM

## 2014-06-13 DIAGNOSIS — I482 Chronic atrial fibrillation, unspecified: Secondary | ICD-10-CM

## 2014-06-13 DIAGNOSIS — I639 Cerebral infarction, unspecified: Secondary | ICD-10-CM

## 2014-06-13 DIAGNOSIS — I6330 Cerebral infarction due to thrombosis of unspecified cerebral artery: Secondary | ICD-10-CM | POA: Diagnosis not present

## 2014-06-13 DIAGNOSIS — R918 Other nonspecific abnormal finding of lung field: Secondary | ICD-10-CM | POA: Diagnosis not present

## 2014-06-13 DIAGNOSIS — R4789 Other speech disturbances: Secondary | ICD-10-CM | POA: Diagnosis not present

## 2014-06-13 DIAGNOSIS — Z66 Do not resuscitate: Secondary | ICD-10-CM | POA: Diagnosis present

## 2014-06-13 DIAGNOSIS — Z86718 Personal history of other venous thrombosis and embolism: Secondary | ICD-10-CM | POA: Diagnosis not present

## 2014-06-13 DIAGNOSIS — S0003XA Contusion of scalp, initial encounter: Secondary | ICD-10-CM | POA: Diagnosis not present

## 2014-06-13 DIAGNOSIS — I6350 Cerebral infarction due to unspecified occlusion or stenosis of unspecified cerebral artery: Secondary | ICD-10-CM | POA: Diagnosis not present

## 2014-06-13 DIAGNOSIS — I6789 Other cerebrovascular disease: Secondary | ICD-10-CM | POA: Diagnosis not present

## 2014-06-13 DIAGNOSIS — F411 Generalized anxiety disorder: Secondary | ICD-10-CM | POA: Diagnosis present

## 2014-06-13 DIAGNOSIS — R29898 Other symptoms and signs involving the musculoskeletal system: Secondary | ICD-10-CM | POA: Diagnosis not present

## 2014-06-13 DIAGNOSIS — I495 Sick sinus syndrome: Secondary | ICD-10-CM | POA: Diagnosis present

## 2014-06-13 DIAGNOSIS — R1310 Dysphagia, unspecified: Secondary | ICD-10-CM | POA: Diagnosis present

## 2014-06-13 DIAGNOSIS — Z950 Presence of cardiac pacemaker: Secondary | ICD-10-CM | POA: Diagnosis not present

## 2014-06-13 DIAGNOSIS — Z7901 Long term (current) use of anticoagulants: Secondary | ICD-10-CM | POA: Diagnosis not present

## 2014-06-13 DIAGNOSIS — Z5189 Encounter for other specified aftercare: Secondary | ICD-10-CM | POA: Diagnosis not present

## 2014-06-13 DIAGNOSIS — I619 Nontraumatic intracerebral hemorrhage, unspecified: Secondary | ICD-10-CM | POA: Diagnosis not present

## 2014-06-13 DIAGNOSIS — Z681 Body mass index (BMI) 19 or less, adult: Secondary | ICD-10-CM | POA: Diagnosis not present

## 2014-06-13 DIAGNOSIS — R471 Dysarthria and anarthria: Secondary | ICD-10-CM | POA: Diagnosis present

## 2014-06-13 DIAGNOSIS — R791 Abnormal coagulation profile: Secondary | ICD-10-CM | POA: Diagnosis present

## 2014-06-13 DIAGNOSIS — I4891 Unspecified atrial fibrillation: Secondary | ICD-10-CM | POA: Diagnosis present

## 2014-06-13 DIAGNOSIS — M4120 Other idiopathic scoliosis, site unspecified: Secondary | ICD-10-CM | POA: Diagnosis not present

## 2014-06-13 DIAGNOSIS — I10 Essential (primary) hypertension: Secondary | ICD-10-CM | POA: Diagnosis present

## 2014-06-13 DIAGNOSIS — R5381 Other malaise: Secondary | ICD-10-CM | POA: Diagnosis not present

## 2014-06-13 DIAGNOSIS — R4701 Aphasia: Secondary | ICD-10-CM | POA: Diagnosis present

## 2014-06-13 DIAGNOSIS — I369 Nonrheumatic tricuspid valve disorder, unspecified: Secondary | ICD-10-CM | POA: Diagnosis not present

## 2014-06-13 DIAGNOSIS — R64 Cachexia: Secondary | ICD-10-CM | POA: Diagnosis present

## 2014-06-13 DIAGNOSIS — I63239 Cerebral infarction due to unspecified occlusion or stenosis of unspecified carotid arteries: Secondary | ICD-10-CM | POA: Diagnosis not present

## 2014-06-13 DIAGNOSIS — R609 Edema, unspecified: Secondary | ICD-10-CM

## 2014-06-13 DIAGNOSIS — S1093XA Contusion of unspecified part of neck, initial encounter: Secondary | ICD-10-CM | POA: Diagnosis not present

## 2014-06-13 LAB — CBC
HCT: 36.1 % (ref 36.0–46.0)
HEMOGLOBIN: 11.7 g/dL — AB (ref 12.0–15.0)
MCH: 31.8 pg (ref 26.0–34.0)
MCHC: 32.4 g/dL (ref 30.0–36.0)
MCV: 98.1 fL (ref 78.0–100.0)
Platelets: 150 10*3/uL (ref 150–400)
RBC: 3.68 MIL/uL — AB (ref 3.87–5.11)
RDW: 16 % — ABNORMAL HIGH (ref 11.5–15.5)
WBC: 3.1 10*3/uL — ABNORMAL LOW (ref 4.0–10.5)

## 2014-06-13 LAB — I-STAT CHEM 8, ED
BUN: 15 mg/dL (ref 6–23)
Calcium, Ion: 1.15 mmol/L (ref 1.13–1.30)
Chloride: 99 mEq/L (ref 96–112)
Creatinine, Ser: 0.5 mg/dL (ref 0.50–1.10)
GLUCOSE: 95 mg/dL (ref 70–99)
HCT: 37 % (ref 36.0–46.0)
Hemoglobin: 12.6 g/dL (ref 12.0–15.0)
Potassium: 4.2 mEq/L (ref 3.7–5.3)
Sodium: 135 mEq/L — ABNORMAL LOW (ref 137–147)
TCO2: 26 mmol/L (ref 0–100)

## 2014-06-13 LAB — URINALYSIS, ROUTINE W REFLEX MICROSCOPIC
Bilirubin Urine: NEGATIVE
GLUCOSE, UA: NEGATIVE mg/dL
Hgb urine dipstick: NEGATIVE
Ketones, ur: NEGATIVE mg/dL
LEUKOCYTES UA: NEGATIVE
Nitrite: NEGATIVE
PH: 7 (ref 5.0–8.0)
Protein, ur: NEGATIVE mg/dL
SPECIFIC GRAVITY, URINE: 1.016 (ref 1.005–1.030)
Urobilinogen, UA: 1 mg/dL (ref 0.0–1.0)

## 2014-06-13 LAB — COMPREHENSIVE METABOLIC PANEL
ALK PHOS: 117 U/L (ref 39–117)
ALT: 22 U/L (ref 0–35)
AST: 43 U/L — ABNORMAL HIGH (ref 0–37)
Albumin: 3.2 g/dL — ABNORMAL LOW (ref 3.5–5.2)
Anion gap: 10 (ref 5–15)
BILIRUBIN TOTAL: 0.5 mg/dL (ref 0.3–1.2)
BUN: 14 mg/dL (ref 6–23)
CO2: 27 mEq/L (ref 19–32)
Calcium: 8.5 mg/dL (ref 8.4–10.5)
Chloride: 98 mEq/L (ref 96–112)
Creatinine, Ser: 0.42 mg/dL — ABNORMAL LOW (ref 0.50–1.10)
GFR, EST NON AFRICAN AMERICAN: 87 mL/min — AB (ref >90)
GLUCOSE: 98 mg/dL (ref 70–99)
Potassium: 5.7 mEq/L — ABNORMAL HIGH (ref 3.7–5.3)
Sodium: 135 mEq/L — ABNORMAL LOW (ref 137–147)
Total Protein: 7 g/dL (ref 6.0–8.3)

## 2014-06-13 LAB — PROTIME-INR
INR: 1.35 (ref 0.00–1.49)
Prothrombin Time: 16.7 seconds — ABNORMAL HIGH (ref 11.6–15.2)

## 2014-06-13 LAB — RAPID URINE DRUG SCREEN, HOSP PERFORMED
Amphetamines: NOT DETECTED
Barbiturates: NOT DETECTED
Benzodiazepines: NOT DETECTED
Cocaine: NOT DETECTED
Opiates: NOT DETECTED
Tetrahydrocannabinol: NOT DETECTED

## 2014-06-13 LAB — DIFFERENTIAL
Basophils Absolute: 0 10*3/uL (ref 0.0–0.1)
Basophils Relative: 1 % (ref 0–1)
Eosinophils Absolute: 0 10*3/uL (ref 0.0–0.7)
Eosinophils Relative: 1 % (ref 0–5)
LYMPHS ABS: 0.6 10*3/uL — AB (ref 0.7–4.0)
Lymphocytes Relative: 20 % (ref 12–46)
MONOS PCT: 9 % (ref 3–12)
Monocytes Absolute: 0.3 10*3/uL (ref 0.1–1.0)
NEUTROS PCT: 69 % (ref 43–77)
Neutro Abs: 2.1 10*3/uL (ref 1.7–7.7)

## 2014-06-13 LAB — I-STAT TROPONIN, ED: Troponin i, poc: 0 ng/mL (ref 0.00–0.08)

## 2014-06-13 LAB — MRSA PCR SCREENING: MRSA BY PCR: NEGATIVE

## 2014-06-13 LAB — APTT: aPTT: 28 seconds (ref 24–37)

## 2014-06-13 LAB — POTASSIUM: POTASSIUM: 4 meq/L (ref 3.7–5.3)

## 2014-06-13 LAB — ETHANOL: Alcohol, Ethyl (B): <11 mg/dL (ref 0–11)

## 2014-06-13 MED ORDER — STROKE: EARLY STAGES OF RECOVERY BOOK
Freq: Once | Status: AC
  Administered 2014-06-13: 17:00:00
  Filled 2014-06-13: qty 1

## 2014-06-13 MED ORDER — SENNOSIDES-DOCUSATE SODIUM 8.6-50 MG PO TABS
1.0000 | ORAL_TABLET | Freq: Every evening | ORAL | Status: DC | PRN
  Filled 2014-06-13: qty 1

## 2014-06-13 MED ORDER — ALTEPLASE (STROKE) FULL DOSE INFUSION
37.0000 mg | Freq: Once | INTRAVENOUS | Status: AC
  Administered 2014-06-13: 37 mg via INTRAVENOUS
  Filled 2014-06-13: qty 37

## 2014-06-13 MED ORDER — STROKE: EARLY STAGES OF RECOVERY BOOK
Freq: Once | Status: AC
  Filled 2014-06-13: qty 1

## 2014-06-13 MED ORDER — PANTOPRAZOLE SODIUM 40 MG IV SOLR
40.0000 mg | Freq: Every day | INTRAVENOUS | Status: DC
  Administered 2014-06-13 – 2014-06-15 (×3): 40 mg via INTRAVENOUS
  Filled 2014-06-13 (×3): qty 40

## 2014-06-13 MED ORDER — SODIUM CHLORIDE 0.9 % IV SOLN
INTRAVENOUS | Status: DC
  Administered 2014-06-14 – 2014-06-17 (×5): via INTRAVENOUS

## 2014-06-13 MED ORDER — ACETAMINOPHEN 650 MG RE SUPP
650.0000 mg | RECTAL | Status: DC | PRN
  Administered 2014-06-13: 650 mg via RECTAL
  Filled 2014-06-13: qty 1

## 2014-06-13 MED ORDER — ACETAMINOPHEN 325 MG PO TABS: 650.0000 mg | ORAL_TABLET | ORAL | Status: DC | PRN

## 2014-06-13 MED ORDER — ACETAMINOPHEN 650 MG RE SUPP: 650.0000 mg | RECTAL | Status: DC | PRN

## 2014-06-13 MED ORDER — SODIUM CHLORIDE 0.9 % IV SOLN
INTRAVENOUS | Status: DC
  Administered 2014-06-13: 16:00:00 via INTRAVENOUS
  Administered 2014-06-13: 75 mL/h via INTRAVENOUS
  Administered 2014-06-16 – 2014-06-19 (×2): 1000 mL via INTRAVENOUS

## 2014-06-13 MED ORDER — SODIUM CHLORIDE 0.9 % IV BOLUS (SEPSIS)
250.0000 mL | Freq: Once | INTRAVENOUS | Status: AC
  Administered 2014-06-13: 250 mL via INTRAVENOUS

## 2014-06-13 MED ORDER — LABETALOL HCL 5 MG/ML IV SOLN
10.0000 mg | INTRAVENOUS | Status: DC | PRN
Start: 2014-06-13 — End: 2014-06-13

## 2014-06-13 MED ORDER — PANTOPRAZOLE SODIUM 40 MG IV SOLR: 40.0000 mg | Freq: Every day | INTRAVENOUS | Status: DC

## 2014-06-13 MED ORDER — LABETALOL HCL 5 MG/ML IV SOLN
10.0000 mg | INTRAVENOUS | Status: DC | PRN
  Administered 2014-06-13: 10 mg via INTRAVENOUS
  Filled 2014-06-13: qty 4

## 2014-06-13 NOTE — ED Notes (Signed)
Family at bedside. 

## 2014-06-13 NOTE — ED Notes (Signed)
Per EMS-Pt comes from [facility] nursing home, LSN at 0700. Was found slumped over in chair, is normally ambulatory and active. Found to have right sided flaccid, slurred speech and altered mental status. BP 141/85, HR 84, 97% RA. CBG 116.

## 2014-06-13 NOTE — Evaluation (Signed)
Clinical/Bedside Swallow Evaluation Patient Details  Name: Christy Miranda MRN: [id number] Date of Birth: [birth year]  Today's Date: 06/13/2014 Time: 1530-1550 SLP Time Calculation (min): 20 min  Past Medical History:  Past Medical History  Diagnosis Date  . HTN (hypertension)   . Permanent atrial fibrillation   . Bradycardia     s/p PPM  . Hypothyroidism   . Anxiety   . Diverticulitis     history of  . History of hysterectomy   . Long-term (current) use of anticoagulants   . Scoliosis (and kyphoscoliosis), idiopathic   . DVT (deep vein thrombosis) in pregnancy     right peroneal  . Dizziness and giddiness 07/22/2013   Past Surgical History:  Past Surgical History  Procedure Laterality Date  . Pacemaker insertion  03/03/2006    most recent generator (MDT) change 10/30/06 by Dr Verlon Setting  . Cardiac catheterization  01/08/1999    normal LV function  . Cardioversion  10/30/2006    Successful elective DC cardioversion  . Cardioversion  12/29/2002    Successful DC cardioversion  . Cardioversion  12/03/1999    Successful DC cardioversion  . Cholecystectomy     HPI:  Christy Miranda is a 80 y.o. female with a history of afib on coumadin who has recently had difficulty with regulating her INR. She was seen to be normal at 7:15 am when her tray was taken to her. At 8 - 8:30 when the tech went to get her tray, she noticed that she was different and EMS was called.    Assessment / Plan / Recommendation Clinical Impression  Pt's current level of alertness is not adequate for safe PO intake at this time. SLP provided oral care with reduced awareness of swab in oral cavity and without significant increase in alertness despite Max cues;  PO trials were held at this time. Recommend to continue NPO at this time, with continued SLP f/u to assess readiness for PO versus objective testing.    Aspiration Risk  Severe    Diet Recommendation NPO   Medication Administration: Via alternative means     Other  Recommendations Oral Care Recommendations: Oral care Q4 per protocol   Follow Up Recommendations  Inpatient Rehab;Skilled Nursing facility    Frequency and Duration min 2x/week  2 weeks   Pertinent Vitals/Pain n/a    SLP Swallow Goals     Swallow Study Prior Functional Status       General Date of Onset: 06/13/14 HPI: Christy Miranda is a 80 y.o. female with a history of afib on coumadin who has recently had difficulty with regulating her INR. She was seen to be normal at 7:15 am when her tray was taken to her. At 8 - 8:30 when the tech went to get her tray, she noticed that she was different and EMS was called.  Type of Study: Bedside swallow evaluation Previous Swallow Assessment: none in chart Diet Prior to this Study: NPO Temperature Spikes Noted: No Respiratory Status: Room air History of Recent Intubation: No Behavior/Cognition: Lethargic;Doesn't follow directions;Decreased sustained attention Oral Cavity - Dentition:  (difficult to view-adequate natural dentition noted on bottom) Self-Feeding Abilities: Total assist Patient Positioning: Upright in bed Baseline Vocal Quality: Low vocal intensity Volitional Cough: Cognitively unable to elicit Volitional Swallow: Unable to elicit    Oral/Motor/Sensory Function Overall Oral Motor/Sensory Function:  (unable to follow commands to participate in oral motor exam)   Ice Chips Ice chips: Not tested   Thin Liquid Thin Liquid: Not tested  Nectar Thick Nectar Thick Liquid: Not tested   Honey Thick Honey Thick Liquid: Not tested   Puree Puree: Not tested   Solid   GO    Solid: Not tested        Germain Osgood, M.A. CCC-SLP [phone]  Germain Osgood 06/13/2014,4:13 PM

## 2014-06-13 NOTE — ED Notes (Signed)
MD at bedside. 

## 2014-06-13 NOTE — Code Documentation (Signed)
80yo female arriving to [facility] from [place].  Patient is from [facility] facility s/p fall with pelvic fracture.  Patient is on Coumadin for DVT and atrial fibrillation.  Patient's dose was recently decreased per paperwork and per daughter her INR was 1.9 yesterday.  Patient has not had her medications today.  EMS reports that the patient was up and about per her usual this morning at the facility.  Facility found patient slumped over with AMS.  EMS assessed right sided weakness and no speech and Code Stroke was called.  Facility staff reports she was LKW at [facility] and discovered symptoms between 0800 and 0830.  Patient's grandson reports prior to the fall she was independent and she lived by herself.  Labs drawn on patient arrival, patient taken to CT.  Initial NIHSS 26, see documentation for details and times.  Patient with right hemiplegia, left forced gaze, mute and not following commands.  Pharmacy notified to mix tPA at 1001 and delivered tPA to the bedside at 1013.  Lab notified to call PT-INR results to Stroke RN when resulted.  Lab called an INR of 1.35 and Dr. Leonel Ramsay aware.  Treatment with tPA discussed with patient's daughter at the bedside by Dr. Leonel Ramsay and she is agreeable to treatment.  tPA bolus given at 1034 and drip followed per MD.  Family updated on plan of care at bedside.  Bedside handoff with ED RN Judson Roch.

## 2014-06-13 NOTE — Progress Notes (Signed)
  Echocardiogram 2D Echocardiogram has been performed.  Christy Miranda 06/13/2014, 5:46 PM

## 2014-06-13 NOTE — H&P (Signed)
Neurology H&P Reason for Consult: Right sided weakness  CC: Right sided weakness  History is obtained from:patient  HPI: Christy Miranda is a 80 y.o. female with a history of afib on coumadin who has recently had difficulty with regulating her INR. She was seen to be normal at 7:15 am when her tray was taken to her. At 8 - 8:30 when the tech went to get her tray, she noticed that she was different and EMS was called.   On arrival, she was found to be weak on the right with a left gaze preference and aphasia.    LKW: 7:15 am tpa given?: yes. After discussion with daughter and son the risks and benefits and fact that there is little data on patients in her age range with the 3 - 4 .5 hour window, but given the benefit seen in the population studied and severe nature of her deficits, I did offer tPA and family agreed she would want it despite risk.     ROS:  Unable to obtain due to altered mental status.   Past Medical History  Diagnosis Date  . HTN (hypertension)   . Permanent atrial fibrillation   . Bradycardia     s/p PPM  . Hypothyroidism   . Anxiety   . Diverticulitis     history of  . History of hysterectomy   . Long-term (current) use of anticoagulants   . Scoliosis (and kyphoscoliosis), idiopathic   . DVT (deep vein thrombosis) in pregnancy     right peroneal  . Dizziness and giddiness 07/22/2013    Family History: Unable to assess secondary to patient's altered mental status.    Social History: Tob: Unable to assess secondary to patient's altered mental status.    Exam: Current vital signs: BP 137/69  Pulse 89  Temp(Src) 96.7 F (35.9 C) (Temporal)  Resp 19  Wt 40.37 kg (89 lb)  SpO2 97% Vital signs in last 24 hours: Temp:  [96.7 F (35.9 C)] 96.7 F (35.9 C) (07/14 1012) Pulse Rate:  [73-90] 89 (07/14 1230) Resp:  [18-30] 19 (07/14 1230) BP: (128-167)/(59-86) 137/69 mmHg (07/14 1230) SpO2:  [96 %-99 %] 97 % (07/14 1230) Weight:  [40.37 kg (89 lb)]  40.37 kg (89 lb) (07/14 0959)  General: in bed Neck: supple Resp: CTAB CV: irregular Abd: NT, ND Ext: no edema Mental Status: Patient is aphasic. She is drowsy but opens eyes to minimal stimulation. She does not follow commands.  Cranial Nerves: II: does not blink to threat from right. Pupils are equal, round, and reactive to light.  Discs are difficult to visualize. III,IV, VI: left gaze preference V: Facial sensation is unable to be checked due to ms VII: Facial movement is left droop VIII, X, XI, XII: Unable to assess secondary to patient's altered mental status.  Motor: Moved left arm well, flexion right arm and leg to noxious stimuli.  Sensory: Decreased on right Deep Tendon Reflexes: 2+ and symmetric in the biceps and patellae.  Cerebellar: Unable to assess secondary to patient's altered mental status.  Gait: Unable to assess secondary to patient's altered mental status.           I have reviewed labs in epic and the results pertinent to this consultation are: INR 1.4  I have reviewed the images obtained:CT head - negative   Impression: 80 yo F with a large left MCA territory infarct by physical exam. She has received tpa and will be admitted to the ICU  Recommendations:  1. HgbA1c, fasting lipid panel 2. Repeat CT tomorrow.  3. Frequent neuro checks 4. Echocardiogram 5. Carotid dopplers 6. Prophylactic therapy-Antiplatelet med: Aspirin - dose 325mg  PO or 300mg  PR 7. Risk factor modification 8. Telemetry monitoring 9. PT consult, OT consult, Speech consult   This patient is critically ill and at significant risk of neurological worsening, death and care requires constant monitoring of vital signs, hemodynamics,respiratory and cardiac monitoring, neurological assessment, discussion with family, other specialists and medical decision making of high complexity. I spent 60 minutes of neurocritical care time  in the care of  this patient.  Roland Rack,  MD [facility] [phone]  If 7pm- 7am, please page neurology on call as listed in [place]. 06/13/2014  2:17 PM

## 2014-06-13 NOTE — Evaluation (Signed)
Speech Language Pathology Evaluation Patient Details Name: Christy Miranda MRN: [id number] DOB: Dec 27, 1920 Today's Date: 06/13/2014 Time: 6606-3016 SLP Time Calculation (min): 21 min  Problem List:  Patient Active Problem List   Diagnosis Date Noted  . Stroke 06/13/2014  . Fracture of multiple pubic rami 05/22/2014  . Encounter for therapeutic drug monitoring 01/02/2014  . Dizziness and giddiness 07/22/2013  . Thrombocytopenia 11/16/2012  . Deep venous thrombosis of lower leg 11/13/2012  . Subtherapeutic international normalized ratio (INR) 11/13/2012  . Pancytopenia 11/13/2012  . Edema 11/07/2012  . Pacemaker-[employer] 08/19/2012  . Current use of long term anticoagulation 08/08/2011  . Atrial fibrillation 02/28/2011  . Tachycardia-bradycardia syndrome 02/28/2011  . Hypertension 02/28/2011   Past Medical History:  Past Medical History  Diagnosis Date  . HTN (hypertension)   . Permanent atrial fibrillation   . Bradycardia     s/p PPM  . Hypothyroidism   . Anxiety   . Diverticulitis     history of  . History of hysterectomy   . Long-term (current) use of anticoagulants   . Scoliosis (and kyphoscoliosis), idiopathic   . DVT (deep vein thrombosis) in pregnancy     right peroneal  . Dizziness and giddiness 07/22/2013   Past Surgical History:  Past Surgical History  Procedure Laterality Date  . Pacemaker insertion  03/03/2006    most recent generator (MDT) change 10/30/06 by Dr Verlon Setting  . Cardiac catheterization  01/08/1999    normal LV function  . Cardioversion  10/30/2006    Successful elective DC cardioversion  . Cardioversion  12/29/2002    Successful DC cardioversion  . Cardioversion  12/03/1999    Successful DC cardioversion  . Cholecystectomy     HPI:  Christy Miranda is a 80 y.o. female with a history of afib on coumadin who has recently had difficulty with regulating her INR. She was seen to be normal at 7:15 am when her tray was taken to her. At 8 - 8:30 when  the tech went to get her tray, she noticed that she was different and EMS was called.    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 Pt appears to have intact focused attention, but sustained attention is limited to intervals of only a few seconds. Pt's speech is comprised primarily of jargon with no clear words verbalized. She did not follow commands despite Max multimodal cues. Pt will benefit from skilled SLP services to maximize functional communication with continued differential diagnosis of cognitive skills as language abilities increase.q    SLP Assessment  Patient needs continued Speech Lanaguage Pathology Services    Follow Up Recommendations  Inpatient Rehab;Skilled Nursing facility    Frequency and Duration min 2x/week  2 weeks   Pertinent Vitals/Pain n/a   SLP Goals  SLP Goals Potential to Achieve Goals: Fair Potential Considerations: Severity of impairments  SLP Evaluation Prior Functioning  Cognitive/Linguistic Baseline: Within functional limits Type of Home: [facility] (for short-term rehab s/p pelvic fracture)   Cognition  Overall Cognitive Status: Impaired/Different from baseline Arousal/Alertness: Lethargic Orientation Level: Other (comment) (unable to assess) Attention: Focused;Sustained Focused Attention: Appears intact Sustained Attention: Impaired Sustained Attention Impairment: Verbal basic;Functional basic Comments: difficult to assess given elements of expressive/receptive aphasia, however LOA appears to impact all higher levels of function    Comprehension  Auditory Comprehension Overall Auditory Comprehension: Impaired Yes/No Questions: Impaired Basic  Biographical Questions: 0-25% accurate Basic Immediate Environment Questions: 0-24% accurate Commands:  Impaired One Step Basic Commands: 0-24% accurate Interfering Components: Attention;Other (comment) (LOA) Visual Recognition/Discrimination Discrimination: Exceptions to [facility] Common Objects: Unable to indentify Reading Comprehension Reading Status: Not tested    Expression Expression Primary Mode of Expression: Verbal Verbal Expression Overall Verbal Expression: Impaired Level of Generative/Spontaneous Verbalization:  (pt produces speech sounds but no words are verbalized) Naming: Impairment Confrontation: Impaired Common Objects: Unable to indentify Verbal Errors: Jargon Written Expression Dominant Hand: Right Written Expression: Not tested   Oral / Motor Oral Motor/Sensory Function Overall Oral Motor/Sensory Function:  (unable to follow commands to participate in oral motor exam) Motor Speech Overall Motor Speech: Impaired Phonation: Low vocal intensity   GO      Germain Osgood, M.A. CCC-SLP [phone]  Germain Osgood 06/13/2014, 4:39 PM

## 2014-06-13 NOTE — ED Provider Notes (Signed)
CSN: [id number]     Arrival date & time 06/13/14  6606 History   First MD Initiated Contact with Patient 06/13/14 0941     No chief complaint on file.    (Consider location/radiation/quality/duration/timing/severity/associated sxs/prior Treatment) HPI 80 year old female comes from a nursing home for a code stroke. Her last seen normal was at 7 AM. She was found later to have decreased responsiveness and right-sided weakness. She is having slurred speech and seems to be altered. The patient was in this facility for a recent pelvic fracture. She is on Coumadin for A. fib and a DVT. Her last known INR is 2.8 on July 4. The patient is currently unable to give a history. Per EMS when the facility relate that the patient is up and alert and able to get around.  Past Medical History  Diagnosis Date  . HTN (hypertension)   . Permanent atrial fibrillation   . Bradycardia     s/p PPM  . Hypothyroidism   . Anxiety   . Diverticulitis     history of  . History of hysterectomy   . Long-term (current) use of anticoagulants   . Scoliosis (and kyphoscoliosis), idiopathic   . DVT (deep vein thrombosis) in pregnancy     right peroneal  . Dizziness and giddiness 07/22/2013   Past Surgical History  Procedure Laterality Date  . Pacemaker insertion  03/03/2006    most recent generator (MDT) change 10/30/06 by Dr Verlon Setting  . Cardiac catheterization  01/08/1999    normal LV function  . Cardioversion  10/30/2006    Successful elective DC cardioversion  . Cardioversion  12/29/2002    Successful DC cardioversion  . Cardioversion  12/03/1999    Successful DC cardioversion  . Cholecystectomy     Family History  Problem Relation Age of Onset  . Coronary artery disease Mother   . Cancer - Other Father     Pancreatic Cancer   History  Substance Use Topics  . Smoking status: Never Smoker   . Smokeless tobacco: Never Used  . Alcohol Use: No   OB History   Grav Para Term Preterm Abortions TAB SAB Ect  Mult Living                 Review of Systems  Unable to perform ROS: Mental status change      Allergies  Review of patient's allergies indicates no known allergies.  Home Medications   Prior to Admission medications   Medication Sig Start Date End Date Taking? Authorizing Provider  acetaminophen (TYLENOL) 325 MG tablet Take 1 tablet (325 mg total) by mouth every 6 (six) hours as needed for mild pain, fever or headache. 05/25/14   Kinnie Feil, MD  ALPRAZolam Duanne Moron) 0.25 MG tablet Take 1 tablet (0.25 mg total) by mouth 3 (three) times daily as needed for anxiety. For anxiety 05/25/14   Kinnie Feil, MD  cholecalciferol (VITAMIN D) 1000 UNITS tablet Take 1,000 Units by mouth daily.    Historical Provider, MD  furosemide (LASIX) 20 MG tablet Take 20 mg by mouth daily.    Historical Provider, MD  levothyroxine (SYNTHROID, LEVOTHROID) 50 MCG tablet Take 1 tablet (50 mcg total) by mouth daily. 01/19/14   Peter M Martinique, MD  metoprolol succinate (TOPROL-XL) 25 MG 24 hr tablet Take 3 tablets (75 mg total) by mouth 2 (two) times daily. 01/19/14   Peter M Martinique, MD  Multiple Vitamin (MULTIVITAMIN WITH MINERALS) TABS Take 1 tablet by mouth daily.  Historical Provider, MD  traMADol (ULTRAM) 50 MG tablet Take 0.5 tablets (25 mg total) by mouth every 6 (six) hours as needed for severe pain. 05/25/14   Kinnie Feil, MD  traZODone (DESYREL) 25 mg TABS tablet Take 0.5 tablets (25 mg total) by mouth at bedtime as needed for sleep. 05/25/14   Kinnie Feil, MD  warfarin (COUMADIN) 5 MG tablet Take 5-7.5 mg by mouth See admin instructions. Take 1 tablet (5mg ) Monday and Friday. Take 1.5 tablets (7.5mg ) all other days.    Historical Provider, MD   BP 154/70  Pulse 88  Resp 30  SpO2 99% Physical Exam  Nursing note and vitals reviewed. Constitutional: She appears lethargic.  HENT:  Head: Normocephalic and atraumatic.  Right Ear: External ear normal.  Left Ear: External ear normal.   Nose: Nose normal.  Eyes: Right eye exhibits no discharge. Left eye exhibits no discharge.  Left sided gaze preference  Cardiovascular: Normal rate, regular rhythm and normal heart sounds.   Pulmonary/Chest: Effort normal and breath sounds normal.  Abdominal: Soft. There is no tenderness.  Neurological: She appears lethargic.  Opens eyes to pain and looks to left. Moves left side but not to command. Does not seem to move right side.  Skin: Skin is warm and dry.    ED Course  Procedures (including critical care time) Labs Review Labs Reviewed  PROTIME-INR - Abnormal; Notable for the following:    Prothrombin Time 16.7 (*)    All other components within normal limits  CBC - Abnormal; Notable for the following:    WBC 3.1 (*)    RBC 3.68 (*)    Hemoglobin 11.7 (*)    RDW 16.0 (*)    All other components within normal limits  DIFFERENTIAL - Abnormal; Notable for the following:    Lymphs Abs 0.6 (*)    All other components within normal limits  COMPREHENSIVE METABOLIC PANEL - Abnormal; Notable for the following:    Sodium 135 (*)    Potassium 5.7 (*)    Creatinine, Ser 0.42 (*)    Albumin 3.2 (*)    AST 43 (*)    GFR calc non Af Amer 87 (*)    All other components within normal limits  I-STAT CHEM 8, ED - Abnormal; Notable for the following:    Sodium 135 (*)    All other components within normal limits  ETHANOL  APTT  URINE RAPID DRUG SCREEN (HOSP PERFORMED)  URINALYSIS, ROUTINE W REFLEX MICROSCOPIC  POTASSIUM  I-STAT TROPOININ, ED  I-STAT TROPOININ, ED    Imaging Review Ct Head Wo Contrast  06/13/2014   CLINICAL DATA:  Code stroke, right-sided weakness  EXAM: CT HEAD WITHOUT CONTRAST  TECHNIQUE: Contiguous axial images were obtained from the base of the skull through the vertex without intravenous contrast.  COMPARISON:  05/21/2014  FINDINGS: There is no evidence of mass effect, midline shift, or extra-axial fluid collections. There is no evidence of a space-occupying  lesion or intracranial hemorrhage. There is no evidence of a cortical-based area of acute infarction. There is generalized cerebral atrophy. There is periventricular white matter low attenuation likely secondary to microangiopathy.  The ventricles and sulci are appropriate for the patient's age. The basal cisterns are patent.  Visualized portions of the orbits are unremarkable. The visualized portions of the paranasal sinuses and mastoid air cells are unremarkable. Cerebrovascular atherosclerotic calcifications are noted.  The osseous structures are unremarkable.  IMPRESSION: No acute intracranial pathology. These results were called  by telephone at the time of interpretation on 06/13/2014 at 9:50 am to Dr. Louanna Raw, who verbally acknowledged these results.   Electronically Signed   By: Kathreen Devoid   On: 06/13/2014 09:52    Date: 06/13/2014  Rate: 90  Rhythm: atrial fibrillation  QRS Axis: right  Intervals: normal  ST/T Wave abnormalities: nonspecific ST/T changes  Conduction Disutrbances:none  Narrative Interpretation: No significant change since 11/14  Old EKG Reviewed: unchanged    MDM   Final diagnoses:  Stroke    9:41 AM Patient minimally responsive to commands, has left gaze preference and only seems to move left side. Is awake though sleepy and is currently protecting airway.   10:30 AM Patient just outside of 3 hour window but Dr. Leonel Ramsay has discussed risks/benefits with family extensively and they agree to do TPA as her INR is subtherapeutic. She is more awake than on initial presentation but still shows signs of severe stroke. Is DNI per family. Will be admitted to neurology.    Ephraim Hamburger, MD 06/13/14 1050

## 2014-06-14 ENCOUNTER — Inpatient Hospital Stay (HOSPITAL_COMMUNITY): Payer: Medicare Other

## 2014-06-14 DIAGNOSIS — I6350 Cerebral infarction due to unspecified occlusion or stenosis of unspecified cerebral artery: Secondary | ICD-10-CM

## 2014-06-14 DIAGNOSIS — I4891 Unspecified atrial fibrillation: Secondary | ICD-10-CM

## 2014-06-14 LAB — GLUCOSE, CAPILLARY
GLUCOSE-CAPILLARY: 93 mg/dL (ref 70–99)
GLUCOSE-CAPILLARY: 86 mg/dL (ref 70–99)
Glucose-Capillary: 114 mg/dL — ABNORMAL HIGH (ref 70–99)
Glucose-Capillary: 89 mg/dL (ref 70–99)
Glucose-Capillary: 86 mg/dL (ref 70–99)

## 2014-06-14 LAB — HEMOGLOBIN A1C
Hgb A1c MFr Bld: 6.1 % — ABNORMAL HIGH (ref <5.7)
Mean Plasma Glucose: 128 mg/dL — ABNORMAL HIGH (ref <117)

## 2014-06-14 LAB — LIPID PANEL
CHOL/HDL RATIO: 2.3 ratio
CHOLESTEROL: 132 mg/dL (ref 0–200)
HDL: 57 mg/dL (ref >39)
LDL Cholesterol: 68 mg/dL (ref 0–99)
TRIGLYCERIDES: 37 mg/dL (ref <150)
VLDL: 7 mg/dL (ref 0–40)

## 2014-06-14 NOTE — Progress Notes (Signed)
PT Cancellation Note  Patient Details Name: Christy Miranda MRN: [id number] DOB: 09/10/1921   Cancelled Treatment:    Reason Eval/Treat Not Completed: Patient not medically ready, active bedrest orders   Duncan Dull 06/14/2014, 8:19 AM Alben Deeds, PT DPT  [phone]

## 2014-06-14 NOTE — Progress Notes (Signed)
Speech Language Pathology Treatment: Dysphagia  Patient Details Name: Christy Miranda MRN: [id number] DOB: [birth year] Today's Date: 06/14/2014 Time: 4503-8882 SLP Time Calculation (min): 20 min  Assessment / Plan / Recommendation Clinical Impression  F/u after yesterday's bedside swallow evaluation.  Grandson present.  Pt readily arousable; responding to questions re: needs with head nod.  Oral care provided.  Pt demonstrated improved oral recognition, mastication of ice chips with good oral seal, and presence of swallow response.  Signs of pharyngeal deficits present. Required total verbal/tactile cues to maintain sufficient LOA for participation.  Recommend continued NPO for now -SLP will continue to follow for goals and readiness.  Grandson in agreement.    HPI HPI: Christy Miranda is a 80 y.o. female with a history of afib on coumadin who has recently had difficulty with regulating her INR. She was seen to be normal at 7:15 am when her tray was taken to her. At 8 - 8:30 when the tech went to get her tray, she noticed that she was different and EMS was called.       SLP Plan  Continue with current plan of care    Recommendations Diet recommendations: NPO              Oral Care Recommendations:  (QID) Follow up Recommendations: Skilled Nursing facility Plan: Continue with current plan of care   Ermon Sagan L. Tivis Ringer, [place] CCC/SLP Pager [phone]      Juan Quam Laurice 06/14/2014, 2:50 PM

## 2014-06-14 NOTE — Progress Notes (Addendum)
Stroke Team Progress Note  HISTORY CC: Right sided weakness  History is obtained from:patient  HPI: Christy Miranda is a 80 y.o. female with a history of afib on coumadin who has recently had difficulty with regulating her INR. She was seen to be normal at 7:15 am on 06/13/2014 when her tray was taken to her. At 8 - 8:30 when the tech went to get her tray, she noticed that she was different and EMS was called.  On arrival, she was found to be weak on the right with a left gaze preference and aphasia.  LKW: 7:15 am on 06/13/14 tpa given?: yes. After discussion with daughter and son the risks and benefits and fact that there is little data on patients in her age range with the 3 - 4 .5 hour window, but given the benefit seen in the population studied and severe nature of her deficits, I did offer tPA and family agreed she would want it despite risk.    She was admitted to the neuro ICU for further evaluation and treatment.  SUBJECTIVE  No acute events overnight.BP well controlled. Family is at the bedside. The patient is drowsy, aphasic with right sided weaknesst and  Speaking but hard to understand OBJECTIVE Most recent Vital Signs: Filed Vitals:   06/14/14 0600 06/14/14 0700 06/14/14 0800 06/14/14 0816  BP: 128/66 105/50 143/59   Pulse: 74 77 78   Temp:    98.3 F (36.8 C)  TempSrc:    Axillary  Resp: 16 16 18    Weight:      SpO2: 97% 99% 99%    CBG (last 3)   Recent Labs  06/13/14 2324  GLUCAP 114*    IV Fluid Intake:   . sodium chloride    . sodium chloride 75 mL/hr at 06/14/14 0800    MEDICATIONS  . pantoprazole (PROTONIX) IV  40 mg Intravenous QHS   PRN:  acetaminophen, acetaminophen, labetalol, senna-docusate  Diet:  NPO   Activity:  Bedrest DVT Prophylaxis: none, got tPA  CLINICALLY SIGNIFICANT STUDIES Basic Metabolic Panel:  Recent Labs Lab 06/13/14 0943 06/13/14 0947 06/13/14 1400  NA 135* 135*  --   K 5.7* 4.2 4.0  CL 98 99  --   CO2 27  --   --    GLUCOSE 98 95  --   BUN 14 15  --   CREATININE 0.42* 0.50  --   CALCIUM 8.5  --   --    Liver Function Tests:  Recent Labs Lab 06/13/14 0943  AST 43*  ALT 22  ALKPHOS 117  BILITOT 0.5  PROT 7.0  ALBUMIN 3.2*   CBC:  Recent Labs Lab 06/13/14 0943 06/13/14 0947  WBC 3.1*  --   NEUTROABS 2.1  --   HGB 11.7* 12.6  HCT 36.1 37.0  MCV 98.1  --   PLT 150  --    Coagulation:  Recent Labs Lab 06/13/14 0943  LABPROT 16.7*  INR 1.35   Cardiac Enzymes: No results found for this basename: CKTOTAL, CKMB, CKMBINDEX, TROPONINI,  in the last 168 hours Urinalysis:  Recent Labs Lab 06/13/14 1000  COLORURINE YELLOW  LABSPEC 1.016  PHURINE 7.0  GLUCOSEU NEGATIVE  HGBUR NEGATIVE  BILIRUBINUR NEGATIVE  KETONESUR NEGATIVE  PROTEINUR NEGATIVE  UROBILINOGEN 1.0  NITRITE NEGATIVE  LEUKOCYTESUR NEGATIVE   Lipid Panel    Component Value Date/Time   CHOL 132 06/14/2014 0229   TRIG 37 06/14/2014 0229   HDL 57 06/14/2014 0229  CHOLHDL 2.3 06/14/2014 0229   VLDL 7 06/14/2014 0229   LDLCALC 68 06/14/2014 0229   HgbA1C  Lab Results  Component Value Date   HGBA1C  Value: 6.4 (NOTE) The ADA recommends the following therapeutic goal for glycemic control related to Hgb A1c measurement: Goal of therapy: <6.5 Hgb A1c  Reference: American Diabetes Association: Clinical Practice Recommendations 2010, Diabetes Care, 2010, 33: (Suppl  1).* 11/29/2009    Urine Drug Screen:     Component Value Date/Time   LABOPIA NONE DETECTED 06/13/2014 1000   COCAINSCRNUR NONE DETECTED 06/13/2014 1000   LABBENZ NONE DETECTED 06/13/2014 1000   AMPHETMU NONE DETECTED 06/13/2014 1000   THCU NONE DETECTED 06/13/2014 1000   LABBARB NONE DETECTED 06/13/2014 1000    Alcohol Level:  Recent Labs Lab 06/13/14 0943  ETH <11    Ct Head Wo Contrast  06/13/2014  IMPRESSION: No acute intracranial pathology.    Dg Chest Port 1 View  06/13/2014    IMPRESSION: Allowing for differences in positioning there has not  been significant interval change since the previous study. Atelectasis or scarring at the lung bases is present.         MRI of the brain  pending  MRA of the brain  pending  Carotid Doppler  pending  2D Echocardiogram Left ventricle: The cavity size was normal. Wall thickness was increased in a pattern of mild LVH. There was focal basal hypertrophy. Systolic function was normal. The estimated ejection fraction was in the range of 50% to 55%. Wall motion was normal; there were no regional wall motion abnormalities   CXR   Allowing for differences in positioning there has not been<BR>significant interval change since the previous study. Atelectasis or<BR>scarring at the lung bases is present.<BR> <BR>  EKG   . Atrial fibrillation Right axis deviation Borderline repolarization abnormality  Therapy Recommendations pending  Physical Exam Blood pressure 143/59, pulse 78, temperature 98.3 F (36.8 C), temperature source Axillary, resp. rate 18, weight 40.37 kg (89 lb), SpO2 99.00%. Gen: Patient is frail elderly woman in no acute distress.  Cardiac: RRR. S1S2 audible. No M/R/G.  Extremities: Cap refill <2 secs. No cyanosis or edema. Pulses 2+ radial and DP. Pulmonary: Respirations regular, symmetric. Lungs clear to auscultation bilat. Abd: Soft, non-tender. BS audible x 4 quadrants.  G/U: Deferred  MS: drowsy, follows occasional midline commands. Globally aphasic. Speech incomprehensible. gibberish Speech: Speech fluent and non-dysarthric. Unable to name and repeat.     CN: left gaze deviation. PERRL. EOMs intact. Facial sensation intact V1-3. No facial droop. Hearing grossly intact.Moderate cough. Sternocleidomastoids and trapezius 5/5 strength. Tongue midline,   no atrophy or fasciculations.  Strength: 5/5  In left extremities proximally and distally. right hemiparesis with 1-2/5 RUE and 3/5 RLE weakness Sensation: Intact to light touch in all four extremities.  Coordination:  cannot test due to aphasia Proprioception: Negative Romberg.   Reflexes: 2+ biceps, brachioradialis bilat. 2+ patellar, achilles bilat. No clonus. Downgoing toes on left and upgoing on right side NIHSS 21  ASSESSMENT Christy Miranda is a 80 y.o. female presenting with altered mental status, aphasia and right hemiparesis. Status post IV t-PA 1034 on 06/13/2014 . Imaging  pending infarct. Infarct felt to be   embolic secondary to  Atrial fibrillation with suboptimal anticoagulation. Suspect large left MCA infract and she is at risk for cerebral edema and neurological worsening. On warfarin prior to admission. Now on no anticoagulation for secondary stroke prevention. Patient with resultant aphasia and right hemiparesis. Stroke  work up underway.   Aphasia and  right hemiparesis  LDL 68  A1c 6.4   Hospital day # 1  TREATMENT/PLAN  Continue ICU level care. Strict neurological follow up. Strict blood pressure control per post TPA protocol.  Check repeat CT scan of the head this a.m.  Aspirin to be given in case of no post TPA hemorrhage.  Patient still has significant aphasia and right hemiparesis prognosis is guarded.  Long discussion with patient's daughter at the bedside regarding her prognosis and answered questions. Family agrees to DO NOT RESUSCITATE and family is realistic about patient's prognosis.    SIGNED Delbert Phenix, NP This patient is critically ill and at significant risk of neurological worsening, death and care requires constant monitoring of vital signs, hemodynamics,respiratory and cardiac monitoring,review of multiple databases, neurological assessment, discussion with family, other specialists and medical decision making of high complexity.I have made any additions or clarifications directly to the above note.  I spent 30 minutes of neurocritical care time  in the care of  this patient. I, the attending vascular neurologist, have personally obtained a history,  examined the patient, evaluated laboratory data and imaging studies, and formulated the assessment and plan of care.  I have made any additions or clarifications directly to the above note and agree with the findings and plan as currently documented. Antony Contras, MD   To contact Stroke Continuity provider, please refer to [email or web address] After hours, contact General Neurology

## 2014-06-15 ENCOUNTER — Inpatient Hospital Stay (HOSPITAL_COMMUNITY): Payer: Medicare Other

## 2014-06-15 ENCOUNTER — Encounter (HOSPITAL_COMMUNITY): Payer: Self-pay

## 2014-06-15 DIAGNOSIS — I63239 Cerebral infarction due to unspecified occlusion or stenosis of unspecified carotid arteries: Secondary | ICD-10-CM

## 2014-06-15 LAB — GLUCOSE, CAPILLARY
GLUCOSE-CAPILLARY: 87 mg/dL (ref 70–99)
Glucose-Capillary: 84 mg/dL (ref 70–99)
Glucose-Capillary: 81 mg/dL (ref 70–99)
Glucose-Capillary: 84 mg/dL (ref 70–99)

## 2014-06-15 MED ORDER — BIOTENE DRY MOUTH MT LIQD
15.0000 mL | Freq: Two times a day (BID) | OROMUCOSAL | Status: DC
  Administered 2014-06-15 – 2014-06-19 (×6): 15 mL via OROMUCOSAL

## 2014-06-15 MED ORDER — METOPROLOL TARTRATE 1 MG/ML IV SOLN
5.0000 mg | Freq: Four times a day (QID) | INTRAVENOUS | Status: DC
Start: 2014-06-15 — End: 2014-06-16
  Administered 2014-06-15 – 2014-06-16 (×4): 5 mg via INTRAVENOUS
  Filled 2014-06-15 (×4): qty 5

## 2014-06-15 MED ORDER — METOPROLOL TARTRATE 1 MG/ML IV SOLN: 5.0000 mg | Freq: Four times a day (QID) | INTRAVENOUS | Status: DC

## 2014-06-15 MED ORDER — CHLORHEXIDINE GLUCONATE 0.12 % MT SOLN
15.0000 mL | Freq: Two times a day (BID) | OROMUCOSAL | Status: DC
  Administered 2014-06-15 – 2014-06-19 (×9): 15 mL via OROMUCOSAL
  Filled 2014-06-15 (×7): qty 15

## 2014-06-15 NOTE — Progress Notes (Signed)
Stroke Team Progress Note  HISTORY CC: Right sided weakness  History is obtained from:patient  HPI: Selinda Korzeniewski is a 80 y.o. female with a history of afib on coumadin who has recently had difficulty with regulating her INR. She was seen to be normal at 7:15 am on 06/13/2014 when her tray was taken to her. At 8 - 8:30 when the tech went to get her tray, she noticed that she was different and EMS was called.  On arrival, she was found to be weak on the right with a left gaze preference and aphasia.  LKW: 7:15 am on 06/13/14 tpa given?: yes. After discussion with daughter and son the risks and benefits and fact that there is little data on patients in her age range with the 3 - 4 .5 hour window, but given the benefit seen in the population studied and severe nature of her deficits, I did offer tPA and family agreed she would want it despite risk.    She was admitted to the neuro ICU for further evaluation and treatment.  SUBJECTIVE  No acute events overnight.BP well controlled. Family is not at the bedside. The patient is drowsy, aphasic with right sided weaknesst and  Speaking but hard to understand. Slightly more awake and interactive then y`day. CT head y`day showed basal ganglia hemorrhagic transformation post TPA with 5.3 mm left to right shift which is stable on repeat CT from this am. OBJECTIVE Most recent Vital Signs: Filed Vitals:   06/15/14 0400 06/15/14 0500 06/15/14 0600 06/15/14 0849  BP: 124/55 137/79 153/64   Pulse: 83 91 107   Temp:    98.8 F (37.1 C)  TempSrc:    Axillary  Resp: 19 40 19   Height:      Weight:      SpO2: 97% 98% 95%    CBG (last 3)   Recent Labs  06/14/14 1724 06/14/14 2209 06/15/14 0848  GLUCAP 86 86 84    IV Fluid Intake:   . sodium chloride 75 mL/hr at 06/14/14 1112  . sodium chloride 75 mL/hr at 06/14/14 1000    MEDICATIONS  . antiseptic oral rinse  15 mL Mouth Rinse q12n4p  . chlorhexidine  15 mL Mouth Rinse BID  . pantoprazole  (PROTONIX) IV  40 mg Intravenous QHS   PRN:  acetaminophen, acetaminophen, labetalol, senna-docusate  Diet:  NPO   Activity:  Bedrest DVT Prophylaxis: none, got tPA  CLINICALLY SIGNIFICANT STUDIES Basic Metabolic Panel:   Recent Labs Lab 06/13/14 0943 06/13/14 0947 06/13/14 1400  NA 135* 135*  --   K 5.7* 4.2 4.0  CL 98 99  --   CO2 27  --   --   GLUCOSE 98 95  --   BUN 14 15  --   CREATININE 0.42* 0.50  --   CALCIUM 8.5  --   --    Liver Function Tests:   Recent Labs Lab 06/13/14 0943  AST 43*  ALT 22  ALKPHOS 117  BILITOT 0.5  PROT 7.0  ALBUMIN 3.2*   CBC:   Recent Labs Lab 06/13/14 0943 06/13/14 0947  WBC 3.1*  --   NEUTROABS 2.1  --   HGB 11.7* 12.6  HCT 36.1 37.0  MCV 98.1  --   PLT 150  --    Coagulation:   Recent Labs Lab 06/13/14 0943  LABPROT 16.7*  INR 1.35   Cardiac Enzymes: No results found for this basename: CKTOTAL, CKMB, CKMBINDEX, TROPONINI,  in the  last 168 hours Urinalysis:   Recent Labs Lab 06/13/14 1000  COLORURINE YELLOW  LABSPEC 1.016  PHURINE 7.0  GLUCOSEU NEGATIVE  HGBUR NEGATIVE  BILIRUBINUR NEGATIVE  KETONESUR NEGATIVE  PROTEINUR NEGATIVE  UROBILINOGEN 1.0  NITRITE NEGATIVE  LEUKOCYTESUR NEGATIVE   Lipid Panel    Component Value Date/Time   CHOL 132 06/14/2014 0229   TRIG 37 06/14/2014 0229   HDL 57 06/14/2014 0229   CHOLHDL 2.3 06/14/2014 0229   VLDL 7 06/14/2014 0229   LDLCALC 68 06/14/2014 0229   HgbA1C  Lab Results  Component Value Date   HGBA1C 6.1* 06/14/2014    Urine Drug Screen:     Component Value Date/Time   LABOPIA NONE DETECTED 06/13/2014 1000   COCAINSCRNUR NONE DETECTED 06/13/2014 1000   LABBENZ NONE DETECTED 06/13/2014 1000   AMPHETMU NONE DETECTED 06/13/2014 1000   THCU NONE DETECTED 06/13/2014 1000   LABBARB NONE DETECTED 06/13/2014 1000    Alcohol Level:   Recent Labs Lab 06/13/14 0943  ETH <11    Ct Head Wo Contrast  06/13/2014  IMPRESSION: No acute intracranial pathology.     Dg Chest Port 1 View  06/13/2014    IMPRESSION: Allowing for differences in positioning there has not been significant interval change since the previous study. Atelectasis or scarring at the lung bases is present.         MRI of the brain  pending  MRA of the brain  pending  Carotid Doppler  pending  2D Echocardiogram Left ventricle: The cavity size was normal. Wall thickness was increased in a pattern of mild LVH. There was focal basal hypertrophy. Systolic function was normal. The estimated ejection fraction was in the range of 50% to 55%. Wall motion was normal; there were no regional wall motion abnormalities   CXR   Allowing for differences in positioning there has not been<BR>significant interval change since the previous study. Atelectasis or<BR>scarring at the lung bases is present.<BR> <BR>  EKG   . Atrial fibrillation Right axis deviation Borderline repolarization abnormality  Therapy Recommendations pending  Physical Exam Blood pressure 153/64, pulse 107, temperature 98.8 F (37.1 C), temperature source Axillary, resp. rate 19, height 5' (1.524 m), weight 89 lb (40.37 kg), SpO2 95.00%. Gen: Patient is frail elderly woman in no acute distress.  Cardiac: RRR. S1S2 audible. No M/R/G.  Extremities: Cap refill <2 secs. No cyanosis or edema. Pulses 2+ radial and DP. Pulmonary: Respirations regular, symmetric. Lungs clear to auscultation bilat. Abd: Soft, non-tender. BS audible x 4 quadrants.  G/U: Deferred  MS: drowsy, but awakens follows occasional midline commands. Globally aphasic. Speech incomprehensible. gibberish Speech: Speech fluent and  mildy dysarthric. Not able to name and repeat.     CN: left gaze deviation. PERRL. EOMs intact. Facial sensation intact V1-3. No facial droop. Hearing grossly intact.   Sternocleidomastoids and trapezius 5/5 strength. Tongue midline,   no atrophy or fasciculations.  Strength: 5/5  In left extremities proximally and distally.  Right hemiparesis with 1-2/5 RUE and 3/5 RLE weakness Sensation: Intact to light touch in all four extremities.  Coordination: cannot test due to aphasia Proprioception: Negative Romberg.   Reflexes: 2+ biceps, brachioradialis bilat. 2+ patellar, achilles bilat. No clonus. Downgoing toes on left and upgoing on right side NIHSS 21  ASSESSMENT Ms. Mackinzie Vuncannon is a 80 y.o. female presenting with altered mental status, aphasia and right hemiparesis. Status post IV t-PA 1034 on 06/13/2014 . Imaging  Shows left MCA branch infarct with post TPA hemorrhagic  transformation. Infarct felt to be   embolic secondary to  Atrial fibrillation with suboptimal anticoagulation.  She is at risk for cerebral edema and neurological worsening. On warfarin prior to admission. Now on no anticoagulation for secondary stroke prevention. Patient with resultant aphasia and right hemiparesis. Stroke work up underway.   Aphasia and  right hemiparesis  LDL 68  A1c 6.4   Hospital day # 2  TREATMENT/PLAN  Transfer out of ICU to neurology floor.Close neurological follow up. Strict blood pressure control per post TPA protocol.  Aspirin to be held for post TPA hemorrhage.  Patient still has significant aphasia and right hemiparesis prognosis is guarded.  Long discussion with patient's daughter and grandson on 06/14/14 at the bedside regarding her prognosis and answered questions. Family agrees to DO NOT RESUSCITATE and family is realistic about patient's prognosis.  Start iv metoprolol 5 mg q 6 hrly till able to swallow or has panda tube  I, the attending vascular neurologist, have personally obtained a history, examined the patient, evaluated laboratory data and imaging studies, and formulated the assessment and plan of care.  I have made any additions or clarifications directly to the above note and agree with the findings and plan as currently documented. SIGNED  Antony Contras, MD    To contact Stroke Continuity  provider, please refer to [email or web address] After hours, contact General Neurology

## 2014-06-15 NOTE — Evaluation (Addendum)
Physical Therapy Evaluation Patient Details Name: Christy Miranda MRN: [id number] DOB: February 14, 1921 Today's Date: 06/15/2014    History of Present Illness    Christy Miranda is a 80 y.o. female presenting with altered mental status, aphasia and right hemiparesis. Status post IV t-PA 1034 on 06/13/2014 . Imaging Shows left MCA branch infarct with post TPA hemorrhagic transformation. Infarct felt to be embolic secondary to Atrial fibrillation with suboptimal anticoagulation. She is at risk for cerebral edema and neurological worsening. On warfarin prior to admission. Now on no anticoagulation for secondary stroke prevention. Patient with resultant aphasia and right hemiparesis. Stroke work up underway.   Clinical Impression  Patient demonstrates significant deficits in functional mobility as indicated below. Patient will benefit from continued skilled PT to address deficits and maximize function. Will see as indicated and progress as tolerated. (Spoke to daughter at length, patient was at [facility] for rehab from pelvic fx prior to admission, family very upset with care at that facility, wishes to pursue other options for for therapy).    Follow Up Recommendations CIR (vs SNF pending progress)    Equipment Recommendations       Recommendations for Other Services Rehab consult     Precautions / Restrictions Precautions Precautions: Fall Restrictions Weight Bearing Restrictions: Yes LLE Weight Bearing: Weight bearing as tolerated Other Position/Activity Restrictions:  (Assume WBAT per chart does not specify.)      Mobility  Bed Mobility Overal bed mobility: Needs Assistance Bed Mobility: Supine to Sit     Supine to sit: Max assist     General bed mobility comments: pt with very minimal initiation of movement in LEs, max assist with suppor to come to EOB  Transfers Overall transfer level: Needs assistance Equipment used:  (face to face with chuck pad) Transfers: Sit to/from  [employer] Transfers Sit to Stand: Max assist;+2 physical assistance Stand pivot transfers: Max assist;+2 physical assistance       General transfer comment: Patient with some shuffling steps for transfer  Ambulation/Gait                Stairs            Wheelchair Mobility    Modified Rankin (Stroke Patients Only)       Balance Overall balance assessment: Needs assistance Sitting-balance support: Feet supported Sitting balance-Leahy Scale: Poor   Postural control: Posterior lean   Standing balance-Leahy Scale: Zero                               Pertinent Vitals/Pain VSS    Home Living Family/patient expects to be discharged to:: Unsure Living Arrangements:  (was receiving therapy at [facility]) Available Help at Discharge:  (TBD)   Home Access: Stairs to enter Entrance Stairs-Rails: None Entrance Stairs-Number of Steps: 3 Home Layout: One level        Prior Function           Comments: daughter states that prior to previous fall, patient was independent, has recently been at [facility] for rehab s/p fall and pelvic fc.     Hand Dominance   Dominant Hand: Right    Extremity/Trunk Assessment               Lower Extremity Assessment: Difficult to assess due to impaired cognition      Cervical / Trunk Assessment: Kyphotic  Communication   Communication: No difficulties  Cognition Arousal/Alertness: Lethargic Behavior During Therapy: Flat affect  Overall Cognitive Status: Impaired/Different from baseline Area of Impairment: Orientation;Attention;Memory;Following commands;Safety/judgement;Awareness;Problem solving Orientation Level: Disoriented to;Person;Place;Time;Situation Current Attention Level: Focused   Following Commands: Follows one step commands inconsistently     Problem Solving: Slow processing;Decreased initiation;Difficulty sequencing;Requires verbal cues;Requires tactile cues      General  Comments      Exercises General Exercises - Lower Extremity Ankle Circles/Pumps: PROM;Both;10 reps Hip Flexion/Marching: PROM;Both;10 reps      Assessment/Plan    PT Assessment Patient needs continued PT services  PT Diagnosis Difficulty walking;Abnormality of gait;Acute pain;Generalized weakness   PT Problem List Decreased strength;Decreased range of motion;Decreased activity tolerance;Decreased balance;Decreased mobility;Decreased knowledge of use of DME;Pain  PT Treatment Interventions DME instruction;Gait training;Functional mobility training;Therapeutic activities;Therapeutic exercise;Balance training;Neuromuscular re-education;Patient/family education;Modalities   PT Goals (Current goals can be found in the Care Plan section) Acute Rehab PT Goals Patient Stated Goal: Get better so I can go home PT Goal Formulation: With patient Time For Goal Achievement: 05/29/14 Potential to Achieve Goals: Fair    Frequency Min 3X/week   Barriers to discharge Decreased caregiver support      Co-evaluation               End of Session   Activity Tolerance: Patient tolerated treatment well Patient left: in chair;with call bell/phone within reach;with chair alarm set;with family/visitor present  With Speech therpist  Nurse Communication: Mobility status         Time: 7741-2878 PT Time Calculation (min): 26 min   Charges:   PT Evaluation $Initial PT Evaluation Tier I: 1 Procedure PT Treatments $Therapeutic Activity: 23-37 mins   PT G CodesDuncan Dull 06/15/2014, 1:27 PM Alben Deeds, [place] DPT  [phone]

## 2014-06-15 NOTE — Progress Notes (Signed)
Bilateral carotid artery duplex:  1-39% ICA stenosis.  Vertebral artery flow is antegrade.     

## 2014-06-15 NOTE — Progress Notes (Signed)
OT Cancellation Note  Patient Details Name: Christy Miranda MRN: [id number] DOB: 04/30/1921   Cancelled Treatment:    Reason Eval/Treat Not Completed: Patient not medically ready - pt continues on strict bedrest.  Will initiate OT eval once activity orders updated.   Darlina Rumpf [place], OTR/L [phone]  06/15/2014, 10:35 AM

## 2014-06-15 NOTE — Procedures (Signed)
Objective Swallowing Evaluation: Modified Barium Swallowing Study  Patient Details  Name: Christy Miranda MRN: [id number] Date of Birth: 09-13-1921  Today's Date: 06/15/2014 Time: 8299-3716 SLP Time Calculation (min): 31 min  Past Medical History:  Past Medical History  Diagnosis Date  . HTN (hypertension)   . Permanent atrial fibrillation   . Bradycardia     s/p PPM  . Hypothyroidism   . Anxiety   . Diverticulitis     history of  . History of hysterectomy   . Long-term (current) use of anticoagulants   . Scoliosis (and kyphoscoliosis), idiopathic   . DVT (deep vein thrombosis) in pregnancy     right peroneal  . Dizziness and giddiness 07/22/2013   Past Surgical History:  Past Surgical History  Procedure Laterality Date  . Pacemaker insertion  03/03/2006    most recent generator (MDT) change 10/30/06 by Dr Verlon Setting  . Cardiac catheterization  01/08/1999    normal LV function  . Cardioversion  10/30/2006    Successful elective DC cardioversion  . Cardioversion  12/29/2002    Successful DC cardioversion  . Cardioversion  12/03/1999    Successful DC cardioversion  . Cholecystectomy     HPI:  Christy Miranda is a 80 y.o. female with a history of afib on coumadin who has recently had difficulty with regulating her INR. She was seen to be normal at 7:15 am when her tray was taken to her. At 8 - 8:30 when the tech went to get her tray, she noticed that she was different and EMS was called.      Assessment / Plan / Recommendation Clinical Impression  Dysphagia Diagnosis: Moderate oral phase dysphagia;Moderate pharyngeal phase dysphagia Clinical impression: Pt presents with a moderate sensorimotor based oropharyngeal dysphagia. Left-sided weakness results in anterior loss, decreased lingual manipulation, prolonged A/P transit, and buccal pocketing. Pt's posturing paired with her delayed onset in pharyngeal swallow result in silent aspiration of nectar thick liquids by cup, and silent  penetration by spoon. Given aphasia, pt requires Max multimodal cues for utilization of any compensatory strategies. Pt demonstrated flash penetration with pureed solids and honey thick liquids via spoon, with all penetrates clearing upon completion of the swallow. Recommend to initiate Dys 1 textures adn honey thick liquids by spoon when pt is fully alert.     Treatment Recommendation  Therapy as outlined in treatment plan below    Diet Recommendation Dysphagia 1 (Puree);Honey-thick liquid   Liquid Administration via: Spoon Medication Administration: Crushed with puree Supervision: Full supervision/cueing for compensatory strategies;Patient able to self feed Compensations: Slow rate;Small sips/bites;Check for pocketing;Check for anterior loss Postural Changes and/or Swallow Maneuvers: Seated upright 90 degrees;Upright 30-60 min after meal;Out of bed for meals    Other  Recommendations Oral Care Recommendations: Oral care BID Other Recommendations: Order thickener from pharmacy;Prohibited food (jello, ice cream, thin soups);Remove water pitcher   Follow Up Recommendations  Inpatient Rehab;24 hour supervision/assistance    Frequency and Duration min 2x/week  2 weeks   Pertinent Vitals/Pain n/a    SLP Swallow Goals     General Date of Onset: 06/13/14 HPI: Christy Miranda is a 80 y.o. female with a history of afib on coumadin who has recently had difficulty with regulating her INR. She was seen to be normal at 7:15 am when her tray was taken to her. At 8 - 8:30 when the tech went to get her tray, she noticed that she was different and EMS was called.  Type of Study: Modified Barium Swallowing  Study Reason for Referral: Objectively evaluate swallowing function Previous Swallow Assessment: BSE 7/14 recommending NPO Diet Prior to this Study: NPO Temperature Spikes Noted: No Respiratory Status: Room air History of Recent Intubation: No Behavior/Cognition: Alert;Cooperative;Pleasant  mood;Requires cueing Oral Cavity - Dentition: Adequate natural dentition Oral Motor / Sensory Function: Impaired - see Bedside swallow eval Self-Feeding Abilities: Able to feed self;Needs assist Patient Positioning: Other (comment) (pt upright in chair but with decreased head control ) Baseline Vocal Quality: Low vocal intensity Volitional Cough: Weak (elicited wtih Max multimodal cues) Volitional Swallow: Able to elicit (with Max cues) Anatomy: Other (Comment) (anterior tilt of head, daughter reports this is increased) Pharyngeal Secretions: Not observed secondary MBS    Reason for Referral Objectively evaluate swallowing function   Oral Phase Oral Preparation/Oral Phase Oral Phase: Impaired Oral - Honey Oral - Honey Teaspoon: Right anterior bolus loss;Weak lingual manipulation;Lingual pumping;Reduced posterior propulsion;Right pocketing in lateral sulci;Delayed oral transit Oral - Nectar Oral - Nectar Teaspoon: Right anterior bolus loss;Weak lingual manipulation;Lingual pumping;Reduced posterior propulsion;Right pocketing in lateral sulci;Delayed oral transit Oral - Nectar Cup: Right anterior bolus loss;Weak lingual manipulation;Lingual pumping;Reduced posterior propulsion;Right pocketing in lateral sulci;Delayed oral transit Oral - Nectar Straw: Other (Comment) (pt unable to suck liquid up through straw this afternoon) Oral - Solids Oral - Puree: Right anterior bolus loss;Weak lingual manipulation;Lingual pumping;Reduced posterior propulsion;Right pocketing in lateral sulci;Delayed oral transit   Pharyngeal Phase Pharyngeal Phase Pharyngeal Phase: Impaired Pharyngeal - Honey Pharyngeal - Honey Teaspoon: Delayed swallow initiation;Reduced anterior laryngeal mobility;Reduced laryngeal elevation;Penetration/Aspiration during swallow;Reduced airway/laryngeal closure Penetration/Aspiration details (honey teaspoon): Material enters airway, remains ABOVE vocal cords then ejected  out Pharyngeal - Nectar Pharyngeal - Nectar Teaspoon: Delayed swallow initiation;Reduced anterior laryngeal mobility;Reduced laryngeal elevation;Penetration/Aspiration before swallow;Penetration/Aspiration during swallow;Reduced airway/laryngeal closure Penetration/Aspiration details (nectar teaspoon): Material enters airway, remains ABOVE vocal cords and not ejected out Pharyngeal - Nectar Cup: Delayed swallow initiation;Reduced anterior laryngeal mobility;Reduced laryngeal elevation;Reduced airway/laryngeal closure;Penetration/Aspiration before swallow Penetration/Aspiration details (nectar cup): Material enters airway, passes BELOW cords without attempt by patient to eject out (silent aspiration) Pharyngeal - Solids Pharyngeal - Puree: Delayed swallow initiation;Reduced anterior laryngeal mobility;Reduced laryngeal elevation;Reduced airway/laryngeal closure;Penetration/Aspiration during swallow Penetration/Aspiration details (puree): Material enters airway, remains ABOVE vocal cords then ejected out  Cervical Esophageal Phase    GO    Cervical Esophageal Phase Cervical Esophageal Phase: [facility]        Germain Osgood, M.A. CCC-SLP [phone]  Germain Osgood 06/15/2014, 3:00 PM

## 2014-06-15 NOTE — Progress Notes (Signed)
Rehab Admissions Coordinator Note:  Patient was screened by Zadie Deemer L for appropriateness for an Inpatient Acute Rehab Consult.  At this time, we are recommending Inpatient Rehab consult.  Darleth Eustache, PT Rehabilitation Admissions Coordinator [phone]  

## 2014-06-15 NOTE — Evaluation (Signed)
Occupational Therapy Evaluation Patient Details Name: Christy Miranda MRN: [id number] DOB: 10/08/1921 Today's Date: 06/15/2014    History of Present Illness This 80 y.o. with recent pelvic fracture (WBAT) who was discharged to [facility] SN[facility] for rehab, was admitted with Rt sided weakness, Lt gaze preference and aphasia.  Initial head CT was negative and TPA was administered.  Repeat CT 7/15 showed Basal ganglia parenchymal hemorrhage consistent with hemorrhagic transformation of acute infart with midline shift.  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 Despite the occipital lobe involvement, the infarct is still from the left carotid circulation - the patient has a fetal left PCA on CTA 05/17/2013   Clinical Impression   Pt admitted with above. She demonstrates the below listed deficits and will benefit from continued OT to maximize safety and independence with BADLs.  Pt presents to OT with communication deficits; what appears to be Rt inattention/neglect - pt with Lt gaze preference; Rt hemiplegia.  She demonstrated considerable improvements throughout OT eval.  Pt spontaneously attempting to use Rt. UE and was able to comb hair with it with mod A - demonstrates active movement of all muscle groups: she was able to sit EOB with min guard assist; followed one step commands ~75% of time with gestural cues; Stood to take steps up side of bed with max A progressing to mod A.  SHe requires max - total A with all BADLs.  Family is not pleased with care she has received in SNF and would like to ultimately take her home with 24 hour assist.  They will hire assist as needed.   Feel she would be an excellent candidate for CIR and will likely make fantastic progress.       Follow Up Recommendations  CIR;Supervision/Assistance - 24 hour    Equipment Recommendations  3 in 1 bedside comode    Recommendations for Other  Services Rehab consult     Precautions / Restrictions Precautions Precautions: Fall Restrictions Weight Bearing Restrictions: Yes LLE Weight Bearing: Weight bearing as tolerated Other Position/Activity Restrictions: Review of previous chart indicates that pt was WBAT after pelvic fracture (Assume WBAT per chart does not specify.)      Mobility Bed Mobility Overal bed mobility: Needs Assistance Bed Mobility: Supine to Sit;Sit to Supine     Supine to sit: Mod assist Sit to supine: Max assist   General bed mobility comments: Pt assisted with moving bil. LEs to EOB, and assisted with lifting trunk   Transfers Overall transfer level: Needs assistance Equipment used: 1 person hand held assist Transfers: Sit to/from Stand Sit to Stand: Mod assist;Max assist Stand pivot transfers: Max assist;Mod assist       General transfer comment: max A, progressing to mod A.  Pt initially leaning heavily to Rt., but with facilitation and repetition, pt progressed to less assist and activated Rt trunk and LE    Balance Overall balance assessment: Needs assistance Sitting-balance support: Feet supported Sitting balance-Leahy Scale: Fair Sitting balance - Comments: Pt iniitally poor, but progressed to min guard (fair) Postural control: Right lateral lean Standing balance support: Single extremity supported Standing balance-Leahy Scale: Poor                              ADL Overall ADL's : Needs assistance/impaired Eating/Feeding: Total assistance   Grooming: Wash/dry hands;Wash/dry face;Brushing hair;Moderate assistance;Sitting   Upper Body Bathing: Maximal  assistance;Sitting;Bed level   Lower Body Bathing: Maximal assistance;Sit to/from stand   Upper Body Dressing : Total assistance;Sitting   Lower Body Dressing: Total assistance;Sit to/from stand   Toilet Transfer: Maximal assistance;Stand-pivot;BSC   Toileting- Clothing Manipulation and Hygiene: Total assistance;Sit  to/from stand       Functional mobility during ADLs: Maximal assistance;Moderate assistance (max assist progressing to mod A) General ADL Comments: See comments under Rt. UE function.        Vision                 Additional Comments: Pt with Lt gaze preference.  WIth moderate cues, pt did look and make eye contact with therapist who was on her Rt.   Unable to accurately assess vision due to communication deficits   Perception Perception Perception Tested?: Yes Perception Deficits: Inattention/neglect Inattention/Neglect: Does not attend to right side of body;Does not attend to right visual field   Praxis Praxis Praxis tested?: Within functional limits    Pertinent Vitals/Pain No indication of pain      Hand Dominance Right   Extremity/Trunk Assessment Upper Extremity Assessment Upper Extremity Assessment: RUE deficits/detail RUE Deficits / Details: Pt initially with only minimal movement Rt. UE.   Does not initiate movement consistently when asked.  However, when pt asked to use comb and comb placed in Lt hand, pt spontneously reached for comb with Rt hand (required assist to complete movement) and then proceeded to comb her hair with mod A.   Active, isolated movement noted in all muscle groups.  Question if pt doesn't have Rt neglect/inattention  RUE Coordination: decreased fine motor;decreased gross motor   Lower Extremity Assessment Lower Extremity Assessment: Defer to PT evaluation   Cervical / Trunk Assessment Cervical / Trunk Assessment: Kyphotic   Communication Communication Communication: Expressive difficulties;Receptive difficulties   Cognition Arousal/Alertness: Awake/alert Behavior During Therapy: Flat affect Overall Cognitive Status: Difficult to assess                     General Comments       Exercises       Shoulder Instructions      Home Living Family/patient expects to be discharged to:: Inpatient rehab                                  Additional Comments: per pt's grandson.  Family would prefer pt go to CIR then home with 24 hour assistance.  They will hire assist if needed      Prior Functioning/Environment Level of Independence: Needs assistance  Gait / Transfers Assistance Needed: Pt was ambulating at SNF with RW.  Prior to pelvic fracture, pt was independently ambulating ADL's / Homemaking Assistance Needed: Unsure pt's level of assist at SNF, but prior to pelvic fracture, pt was independent with all BADLs and basic meal prep        OT Diagnosis: Generalized weakness;Cognitive deficits;Disturbance of vision;Hemiplegia dominant side   OT Problem List: Decreased strength;Decreased range of motion;Decreased activity tolerance;Impaired balance (sitting and/or standing);Impaired vision/perception;Decreased coordination;Decreased cognition;Decreased safety awareness;Decreased knowledge of use of DME or AE;Impaired UE functional use   OT Treatment/Interventions: Self-care/ADL training;Neuromuscular education;DME and/or AE instruction;Therapeutic activities;Cognitive remediation/compensation;Visual/perceptual remediation/compensation;Patient/family education;Balance training    OT Goals(Current goals can be found in the care plan section) Acute Rehab OT Goals OT Goal Formulation: Patient unable to participate in goal setting Time For Goal Achievement: 06/29/14 Potential to Achieve Goals: Good  ADL Goals Pt Will Perform Grooming: with min assist;sitting Pt Will Perform Upper Body Bathing: with min assist;sitting Pt Will Perform Lower Body Bathing: with mod assist;sit to/from stand Pt Will Perform Lower Body Dressing: with mod assist;sit to/from stand Pt Will Transfer to Toilet: with mod assist;ambulating;regular height toilet;bedside commode;grab bars Pt Will Perform Toileting - Clothing Manipulation and hygiene: with mod assist;sit to/from stand Additional ADL Goal #1: Pt will consistently use Rt.  UE as an active assist during BADLs Additional ADL Goal #2: Pt will attend to objects on her Rt side with no more than min verbal cues  OT Frequency: Min 3X/week   Barriers to D/C:    Family plans to hire assistance per grandson       Co-evaluation              End of Session Nurse Communication: Mobility status  Activity Tolerance: Patient tolerated treatment well Patient left: in bed;with call bell/phone within reach;with bed alarm set;with family/visitor present   Time: 9983-3825 OT Time Calculation (min): 46 min Charges:  OT General Charges $OT Visit: 1 Procedure OT Evaluation $Initial OT Evaluation Tier I: 1 Procedure OT Treatments $Self Care/Home Management : 8-22 mins $Neuromuscular Re-education: 23-37 mins G-Codes:    Chelbie Jarnagin M 17-Jun-2014, 5:48 PM

## 2014-06-15 NOTE — Progress Notes (Addendum)
Speech Language Pathology Treatment: Dysphagia;Cognitive-Linquistic  Patient Details Name: Christy Miranda MRN: [id number] DOB: 1921/03/06 Today's Date: 06/15/2014 Time: 1000-1049 SLP Time Calculation (min): 49 min  Assessment / Plan / Recommendation Clinical Impression  Pt seen for f/u dysphagia and cognitive-linguistic tx. Pt OOB in chair with PT and more alert today. Pt exhibited overt signs of aspiration with thin liquids. Signs of a pharyngeal dysphagia became evident after initial few bites/sips with Dys 1 textures and nectar thick liquids, and may be indicative of pharyngeal residue. Recommend MBS to objectively evaluate oropharyngeal swallow to determine readiness for PO diet.  Pt with increased sustained attention today requiring Min cues for redirection to self-feeding task. Pt followed one-step commands ~75% of trials with Max multimodal cues. She counted 1-5 with Max cues, and spontaneously verbalized automatic responses throughout session ("yeah", "alright", "what"). Continue plan of care.   HPI HPI: Christy Miranda is a 80 y.o. female with a history of afib on coumadin who has recently had difficulty with regulating her INR. She was seen to be normal at 7:15 am when her tray was taken to her. At 8 - 8:30 when the tech went to get her tray, she noticed that she was different and EMS was called.    Pertinent Vitals n/a  SLP Plan  MBS    Recommendations Diet recommendations: NPO (pending objective testing) Medication Administration: Via alternative means              Oral Care Recommendations: Oral care Q4 per protocol Follow up Recommendations: Inpatient Rehab  Plan: MBS    GO      Germain Osgood, M.A. CCC-SLP [phone]  Germain Osgood 06/15/2014, 10:58 AM

## 2014-06-16 DIAGNOSIS — I619 Nontraumatic intracerebral hemorrhage, unspecified: Secondary | ICD-10-CM

## 2014-06-16 LAB — GLUCOSE, CAPILLARY
GLUCOSE-CAPILLARY: 68 mg/dL — AB (ref 70–99)
Glucose-Capillary: 72 mg/dL (ref 70–99)
Glucose-Capillary: 197 mg/dL — ABNORMAL HIGH (ref 70–99)

## 2014-06-16 MED ORDER — LEVOFLOXACIN 500 MG PO TABS
500.0000 mg | ORAL_TABLET | Freq: Every day | ORAL | Status: AC
  Administered 2014-06-16: 500 mg via ORAL
  Filled 2014-06-16: qty 1

## 2014-06-16 MED ORDER — DEXTROSE 50 % IV SOLN
1.0000 | Freq: Once | INTRAVENOUS | Status: AC
  Administered 2014-06-16: 50 mL via INTRAVENOUS
  Filled 2014-06-16: qty 50

## 2014-06-16 MED ORDER — PANTOPRAZOLE SODIUM 40 MG PO TBEC
40.0000 mg | DELAYED_RELEASE_TABLET | Freq: Every day | ORAL | Status: DC
  Administered 2014-06-16 – 2014-06-18 (×3): 40 mg via ORAL
  Filled 2014-06-16 (×3): qty 1

## 2014-06-16 MED ORDER — ALPRAZOLAM 0.25 MG PO TABS
0.2500 mg | ORAL_TABLET | Freq: Three times a day (TID) | ORAL | Status: DC | PRN
  Filled 2014-06-16: qty 1

## 2014-06-16 MED ORDER — FUROSEMIDE 20 MG PO TABS: 20.0000 mg | ORAL_TABLET | Freq: Every day | ORAL | Status: DC | PRN

## 2014-06-16 MED ORDER — LEVOTHYROXINE SODIUM 50 MCG PO TABS
50.0000 ug | ORAL_TABLET | Freq: Every day | ORAL | Status: DC
  Administered 2014-06-17 – 2014-06-19 (×3): 50 ug via ORAL
  Filled 2014-06-16 (×3): qty 1

## 2014-06-16 MED ORDER — METOPROLOL SUCCINATE ER 25 MG PO TB24
50.0000 mg | ORAL_TABLET | Freq: Two times a day (BID) | ORAL | Status: DC
  Administered 2014-06-16 – 2014-06-19 (×6): 50 mg via ORAL
  Filled 2014-06-16 (×6): qty 2

## 2014-06-16 NOTE — Progress Notes (Addendum)
Rehab admissions - I met with pt and her daughter in follow up to rehab consult. I explained the purpose of inpatient rehab and questions were answered. Informational brochures were given. Pt was aphasic and dtr provided baseline information and answered my questions. Dtr is interested in pursuing inpatient rehab and does not want pt to return to [facility].  I explained that our program is short term intensive rehab and pt's possible length of stay could be 20-28 days and then the plan would be for home with support. Dtr works part time (3 days/week) and stated that they could hire caregivers for when dtr would not be available.  I explained that our rehab MD was concerned that pt would be able to tolerate the intensity of our program and that we will follow pt's progress with therapies over the weekend. I explained that if MD feels pt cannot tolerate our program, that skilled nursing would then be recommended.  I spoke directly with [place], PT who will see pt later today for therapy and requested that pt be seen over the weekend if possible to help determine her overall activity tolerance. I also spoke with Secundino Ginger, OT about pt's case.  I updated Loma Sousa, case manager who then contacted Cassandra with social work.  I will follow pt's case and she how she progresses with therapies. I will then update our rehab team on Monday am to make determination about possible inpatient rehab.  Please call me with any questions. Thanks.  Nanetta Batty, PT Rehabilitation Admissions Coordinator [phone]

## 2014-06-16 NOTE — Progress Notes (Signed)
Stroke Team Progress Note  HISTORY Christy Miranda is a 80 y.o. female with a history of afib on coumadin who has recently had difficulty with regulating her INR. She was seen to be normal at 7:15 am on 06/13/2014 when her tray was taken to her. At 8 - 8:30 when the tech went to get her tray, she noticed that she was different and EMS was called. On arrival to the hospital, she was found to be weak on the right with a left gaze preference and aphasia. tpa was given ?: after discussion with daughter and son the risks and benefits and fact that there is little data on patients in her age range with the 3 - 4 .5 hour window, but given the benefit seen in the population studied and severe nature of her deficits, I did offer tPA and family agreed she would want it despite risk. Following, She was admitted to the neuro ICU for further evaluation and treatment.  SUBJECTIVE Daughter at bedside. ST just finished evaluating swallow.plan to chart diet . Await rehab decision  OBJECTIVE Most recent Vital Signs: Filed Vitals:   06/15/14 1538 06/15/14 2142 06/16/14 0210 06/16/14 0500  BP: 165/79 172/80 142/65 155/64  Pulse: 87 91 97 91  Temp: 99.4 F (37.4 C) 99.1 F (37.3 C) 99.4 F (37.4 C) 98.2 F (36.8 C)  TempSrc: Oral Oral Oral Oral  Resp: 18 18 16 18   Height:      Weight:      SpO2: 99% 96% 98% 98%   CBG (last 3)   Recent Labs  06/16/14 0638 06/16/14 0758 06/16/14 0831  GLUCAP 68* 72 197*    IV Fluid Intake:   . sodium chloride 75 mL/hr at 06/16/14 0506  . sodium chloride 75 mL/hr at 06/14/14 1000    MEDICATIONS  . antiseptic oral rinse  15 mL Mouth Rinse q12n4p  . chlorhexidine  15 mL Mouth Rinse BID  . metoprolol  5 mg Intravenous 4 times per day  . pantoprazole (PROTONIX) IV  40 mg Intravenous QHS   PRN:  acetaminophen, acetaminophen, labetalol, senna-docusate  Diet:  NPO   Activity:  Up with assistance DVT Prophylaxis: SCDs   CLINICALLY SIGNIFICANT STUDIES Basic Metabolic  Panel:   Recent Labs Lab 06/13/14 0943 06/13/14 0947 06/13/14 1400  NA 135* 135*  --   K 5.7* 4.2 4.0  CL 98 99  --   CO2 27  --   --   GLUCOSE 98 95  --   BUN 14 15  --   CREATININE 0.42* 0.50  --   CALCIUM 8.5  --   --    Liver Function Tests:   Recent Labs Lab 06/13/14 0943  AST 43*  ALT 22  ALKPHOS 117  BILITOT 0.5  PROT 7.0  ALBUMIN 3.2*   CBC:   Recent Labs Lab 06/13/14 0943 06/13/14 0947  WBC 3.1*  --   NEUTROABS 2.1  --   HGB 11.7* 12.6  HCT 36.1 37.0  MCV 98.1  --   PLT 150  --    Coagulation:   Recent Labs Lab 06/13/14 0943  LABPROT 16.7*  INR 1.35   Cardiac Enzymes: No results found for this basename: CKTOTAL, CKMB, CKMBINDEX, TROPONINI,  in the last 168 hours Urinalysis:   Recent Labs Lab 06/13/14 1000  COLORURINE YELLOW  LABSPEC 1.016  PHURINE 7.0  GLUCOSEU NEGATIVE  HGBUR NEGATIVE  BILIRUBINUR NEGATIVE  KETONESUR NEGATIVE  PROTEINUR NEGATIVE  UROBILINOGEN 1.0  NITRITE NEGATIVE  LEUKOCYTESUR NEGATIVE   Lipid Panel    Component Value Date/Time   CHOL 132 06/14/2014 0229   TRIG 37 06/14/2014 0229   HDL 57 06/14/2014 0229   CHOLHDL 2.3 06/14/2014 0229   VLDL 7 06/14/2014 0229   LDLCALC 68 06/14/2014 0229   HgbA1C  Lab Results  Component Value Date   HGBA1C 6.1* 06/14/2014    Urine Drug Screen:     Component Value Date/Time   LABOPIA NONE DETECTED 06/13/2014 1000   COCAINSCRNUR NONE DETECTED 06/13/2014 1000   LABBENZ NONE DETECTED 06/13/2014 1000   AMPHETMU NONE DETECTED 06/13/2014 1000   THCU NONE DETECTED 06/13/2014 1000   LABBARB NONE DETECTED 06/13/2014 1000    Alcohol Level:   Recent Labs Lab 06/13/14 0943  ETH <11    Ct Head Wo Contrast 06/15/2014     IMPRESSION: 1. Size stable hematoma centered in the left putamen, up to 36 mm. Unchanged midline shift of 5 mm. 2. Increasingly conspicuous infarct in the left basal ganglia, insula, and left occipital lobe. Despite the occipital lobe involvement, the infarct is still  from the left carotid circulation - the patient has a fetal left PCA on CTA 05/17/2013.    06/14/2014     IMPRESSION: New 3.6 cm left basal ganglia parenchymal hemorrhage, consistent with hemorrhagic transformation of an acute infarct. 5 mm rightward midline shift.   06/13/2014     IMPRESSION: No acute intracranial pathology.    MRI/MRIA of the brain  pacer  Carotid Doppler  No evidence of hemodynamically significant internal carotid artery stenosis. Vertebral artery flow is antegrade.   2D Echocardiogram  Left ventricle: The cavity size was normal. Wall thickness was increased in a pattern of mild LVH. There was focal basal hypertrophy. Systolic function was normal. The estimated ejection fraction was in the range of 50% to 55%. Wall motion was normal; there were no regional wall motion abnormalities  Dg Chest Port 1 View 06/13/2014    IMPRESSION: Allowing for differences in positioning there has not been significant interval change since the previous study. Atelectasis or scarring at the lung bases is present.     EKG   . Atrial fibrillation. Right axis deviation. Borderline repolarization abnormality  Therapy Recommendations CIR  Physical Exam Blood pressure 155/64, pulse 91, temperature 98.2 F (36.8 C), temperature source Oral, resp. rate 18, height 5' (1.524 m), weight 40.37 kg (89 lb), SpO2 98.00%. Gen: Patient is frail elderly woman in no acute distress.  Cardiac: RRR. S1S2 audible. No M/R/G.  Extremities: Cap refill <2 secs. No cyanosis or edema. Pulses 2+ radial and DP. Pulmonary: Respirations regular, symmetric. Lungs clear to auscultation bilat. Abd: Soft, non-tender. BS audible x 4 quadrants.  G/U: Deferred  MS: awake and tracks a little follows occasional midline commands. Globally aphasic. Speech incomprehensible. gibberish Speech: Speech fluent and  mildy dysarthric. Not able to name and repeat.     CN: left gaze deviation. PERRL. EOMs intact. Facial sensation intact V1-3.  No facial droop. Hearing grossly intact.   Sternocleidomastoids and trapezius 5/5 strength. Tongue midline,   no atrophy or fasciculations.  Strength: 5/5  In left extremities proximally and distally. Right hemiparesis with 1-2/5 RUE and 3/5 RLE weakness Sensation: Intact to light touch in all four extremities.  Coordination: cannot test due to aphasia Proprioception: Negative Romberg.   Reflexes: 2+ biceps, brachioradialis bilat. 2+ patellar, achilles bilat. No clonus. Downgoing toes on left and upgoing on right side    ASSESSMENT Christy Miranda is  a 80 y.o. female presenting with altered mental status, aphasia and right hemiparesis. Status post IV t-PA 1034 on 06/13/2014 . Imaging shows left MCA branch infarct with asymptomatic post TPA hemorrhagic transformation. Infarct felt to be embolic secondary to  Atrial fibrillation with suboptimal anticoagulation.  On warfarin prior to admission, INR subtherapeutic on arrival at 1.35. Now on no anticoagulation secondary to hemorrhagic transfromation for secondary stroke prevention. Patient with resultant global aphasia, dysphagia and right hemiparesis. Stroke work up completed.dysphagi improved slightly   atrial fibrillation, not an anticoagulation candidate due to hemorrhage  LDL 68  A1c 6.4  Cachectic/frail Body mass index is 17.38 kg/(m^2).   Family agrees to DO NOT RESUSCITATE   Hospital day # 3  TREATMENT/PLAN  Repeat CT head on Monday to evaluate for hemorrhage resolution with consideration of starting aspirin 81 mg daily  IP rehab consult pending  Start D1 honey thick liquid diet as per speech therapy eval  D/w daughter x 10 mins   SIGNED Burnetta Sabin, MSN, RN, ANVP-BC, ANP-BC, GNP-BC [facility] Pager: [phone] 06/16/2014 10:52 AM   I, the attending vascular neurologist, have personally obtained a history, examined the patient, evaluated laboratory data and imaging studies, and formulated the assessment and  plan of care.  I have made any additions or clarifications directly to the above note and agree with the findings and plan as currently documented.  SIGNED   Antony Contras, MD  To contact Stroke Continuity provider, please refer to [email or web address] After hours, contact General Neurology

## 2014-06-16 NOTE — Care Management Note (Addendum)
  Page 1 of 1   06/19/2014     10:52:56 AM CARE MANAGEMENT NOTE 06/19/2014  Patient:  Christy Miranda, Christy Miranda   Account Number:  [id number]  Date Initiated:  06/15/2014  Documentation initiated by:  Lorne Skeens  Subjective/Objective Assessment:   Patient was admitted with right sided weakness. Recieved TPA on admission. Resides at [facility]     Action/Plan:   Will follow for discharge needs pending PT/OT evals and physician orders   Anticipated DC Date:     Anticipated DC Plan:  SKILLED NURSING FACILITY  In-house referral  Clinical Social Worker         Choice offered to / List presented to:             Status of service:   Medicare Important Message given?  YES (If response is "NO", the following Medicare IM given date fields will be blank) Date Medicare IM given:  06/16/2014 Medicare IM given by:  Lorne Skeens Date Additional Medicare IM given:  06/19/2014 Additional Medicare IM given by:  Lorne Skeens  Discharge Disposition:    Per UR Regulation:  Reviewed for med. necessity/level of care/duration of stay  If discussed at [place] of Stay Meetings, dates discussed:    Comments:  06/19/14 [place], MSN, CM- Met with patient's daughter to discuss additional Medicare IM letter. Previous copy still available at bedside, no new copy provided.   06/16/14 0945 Lorne Skeens RN, MSN, CM- Medicare IM letter provided.

## 2014-06-16 NOTE — Progress Notes (Signed)
Speech Language Pathology Treatment: Dysphagia;Cognitive-Linquistic  Patient Details Name: Christy Miranda MRN: [id number] DOB: May 31, 1921 Today's Date: 06/16/2014 Time: 1000-1041 SLP Time Calculation (min): 41 min  Assessment / Plan / Recommendation Clinical Impression  Pt seen for dysphagia and cognitive-linguistic tx. Note per chart that diet has not yet been started (provided recommendations again to NP); SLP observed with Dys 1 textures and honey-thick liquids via spoon as recommended by MBS completed yesterday. Pt naturally took very small bites/sips, taking multiple bites with one spoonful. No overt s/s of aspiration observed. Pt continues to require Max multimodal cues for following one-step commands. She required Max faded to Min cues for initiation of self-feeding task as session continued. SLP provided Max cues for automatic speech task counting 1-5. Will continue to follow.   HPI HPI: Christy Miranda is a 80 y.o. female with a history of afib on coumadin who has recently had difficulty with regulating her INR. She was seen to be normal at 7:15 am when her tray was taken to her. At 8 - 8:30 when the tech went to get her tray, she noticed that she was different and EMS was called.    Pertinent Vitals n/a  SLP Plan  Continue with current plan of care    Recommendations Diet recommendations: Dysphagia 1 (puree);Honey-thick liquid Liquids provided via: Teaspoon Medication Administration: Crushed with puree Supervision: Full supervision/cueing for compensatory strategies;Patient able to self feed Compensations: Slow rate;Small sips/bites;Check for pocketing;Check for anterior loss Postural Changes and/or Swallow Maneuvers: Seated upright 90 degrees;Upright 30-60 min after meal;Out of bed for meals              Oral Care Recommendations: Oral care BID Follow up Recommendations: Inpatient Rehab;24 hour supervision/assistance Plan: Continue with current plan of care    GO       Germain Osgood, M.A. CCC-SLP [phone]  Germain Osgood 06/16/2014, 10:45 AM

## 2014-06-16 NOTE — Progress Notes (Signed)
Hypoglycemic Event  CBG: 72  Treatment: amp D50  Symptoms: none  Follow-up CBG: Time:0830 CBG Result:197  Possible Reasons for Event: NPO  Comments/MD notified:Sethi paged    Orvill Coulthard Q  Remember to initiate Hypoglycemia Order Set & complete

## 2014-06-16 NOTE — Progress Notes (Signed)
Physical Therapy Treatment Patient Details Name: Christy Miranda MRN: [id number] DOB: [birth year] Today's Date: 06/16/2014    History of Present Illness This 80 y.o. with recent pelvic fracture (WBAT) who was discharged to [facility] for rehab, was admitted with Rt sided weakness, Lt gaze preference and aphasia.  Initial head CT was negative and TPA was administered.  Repeat CT 7/15 showed Basal ganglia parenchymal hemorrhage consistent with hemorrhagic transformation of acute infart with midline shift.  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 Despite the occipital lobe involvement, the infarct is still from the left carotid circulation - the patient has a fetal left PCA on CTA 05/17/2013    PT Comments    Pt continues to require max (A) for stand pivot transfers at this time. Pt more alert and following one step commands ~50% of session. Grandson present during therapy session and very supportive. Family very hopeful pt can D/C to CIR to progress her mobility and increase independence prior to hospital D/C. Will cont to follow per POC.  Follow Up Recommendations  CIR (vs SNF pending progress - family very hopeful for CIR )     Equipment Recommendations  Other (comment) (TBD)    Recommendations for Other Services Rehab consult     Precautions / Restrictions Precautions Precautions: Fall Precaution Comments: Rt sided hemiparesis  Restrictions Weight Bearing Restrictions: Yes LLE Weight Bearing: Weight bearing as tolerated Other Position/Activity Restrictions: Review of previous chart indicates that pt was WBAT after pelvic fracture    Mobility  Bed Mobility Overal bed mobility: Needs Assistance Bed Mobility: Supine to Sit     Supine to sit: HOB elevated;Max assist     General bed mobility comments: pt with Rt sided hemiparesis; requires max (A) to bring hips to EOB and elevate trunk; pt  attempting to (A) with bed mobility using Lt UE  Transfers Overall transfer level: Needs assistance Equipment used: 1 person hand held assist Transfers: Sit to/from [facility] Sit to Stand: Mod assist Stand pivot transfers: Max assist       General transfer comment: pt (A) with transfers by powering up with Lt LE and UE; requires mod (A) with use of draw pad to elevate trunk to standing with Rt LE blcoked; max (A) to perform SPT to chair and facilitate pivotal steps ; heavily leaning to Rt during transfer  Ambulation/Gait             General Gait Details: will require 2 person (A)    Stairs            Wheelchair Mobility    Modified Rankin (Stroke Patients Only)       Balance Overall balance assessment: Needs assistance Sitting-balance support: Feet supported;Single extremity supported Sitting balance-Leahy Scale: Fair Sitting balance - Comments: progresing to fair; pt with kyphotic posture and Rt cervical rotation and lt sidebend; focused on trunk control sitting EOB with upright psoture and head in midline; required (A) to maintain; tolerated sitting ~8 min at EOB Postural control: Posterior lean Standing balance support: Bilateral upper extremity supported;During functional activity Standing balance-Leahy Scale: Poor Standing balance comment: requires mod-max (A)                    Cognition Arousal/Alertness: Awake/alert Behavior During Therapy: Flat affect Overall Cognitive Status: Difficult to assess         Following Commands: Follows one step commands inconsistently  Exercises      General Comments General comments (skin integrity, edema, etc.): educated grandon to sit on pt Rt and encourage Rt gaze      Pertinent Vitals/Pain Answered "no" when asked if she was having pain multiple times during session    Home Living                      Prior Function            PT Goals (current goals  can now be found in the care plan section) Acute Rehab PT Goals Patient Stated Goal: pt unable to state due to impaired communication PT Goal Formulation: With patient Time For Goal Achievement: 06/23/14 Potential to Achieve Goals: Fair Progress towards PT goals: Progressing toward goals    Frequency  Min 3X/week    PT Plan Current plan remains appropriate    Co-evaluation             End of Session Equipment Utilized During Treatment: Gait belt Activity Tolerance: Patient tolerated treatment well Patient left: in chair;with chair alarm set;with family/visitor present;with nursing/sitter in room     Time: 5400-8676 PT Time Calculation (min): 19 min  Charges:  $Therapeutic Activity: 8-22 mins                    G CodesGustavus Bryant, [place]  [phone] 06/16/2014, 4:49 PM

## 2014-06-16 NOTE — Consult Note (Signed)
Physical Medicine and Rehabilitation Consult Reason for Consult: CVA Referring Physician: Dr. Leonie Man   HPI: Christy Miranda is a 80 y.o. right-handed female with history of atrial fibrillation status post pacemaker with chronic Coumadin therapy, recent fall with pelvic fractures and had recently been at skilled nursing facility secondary to pelvic fractures.. Admitted 06/13/2049 with right-sided weakness as well as aphasia. INR on admission of 1.35. Initial cranial CT scan negative for acute changes. Patient did receive TPA. Followup cranial CT scan showed no 3.6 cm left basal ganglia parenchymal hemorrhage consistent with hemorrhagic transformation of acute infarct. 5 mm rightward midline shift. Echocardiogram with ejection fraction of 55% no wall motion abnormalities. Carotid Dopplers with no ICA stenosis. Neurology services consulted with workup ongoing. Aspirin held for post TPA hemorrhage. Modified barium swallow 06/15/2014 maintained on a dysphagia 1 honey thick liquid diet. Physical occupational therapy evaluations completed with recommendations of physical medicine rehabilitation consult.   Review of Systems  Unable to perform ROS: language   Past Medical History  Diagnosis Date  . HTN (hypertension)   . Permanent atrial fibrillation   . Bradycardia     s/p PPM  . Hypothyroidism   . Anxiety   . Diverticulitis     history of  . History of hysterectomy   . Long-term (current) use of anticoagulants   . Scoliosis (and kyphoscoliosis), idiopathic   . DVT (deep vein thrombosis) in pregnancy     right peroneal  . Dizziness and giddiness 07/22/2013   Past Surgical History  Procedure Laterality Date  . Pacemaker insertion  03/03/2006    most recent generator (MDT) change 10/30/06 by Dr Verlon Setting  . Cardiac catheterization  01/08/1999    normal LV function  . Cardioversion  10/30/2006    Successful elective DC cardioversion  . Cardioversion  12/29/2002    Successful DC  cardioversion  . Cardioversion  12/03/1999    Successful DC cardioversion  . Cholecystectomy     Family History  Problem Relation Age of Onset  . Coronary artery disease Mother   . Cancer - Other Father     Pancreatic Cancer   Social History:  reports that she has never smoked. She has never used smokeless tobacco. She reports that she does not drink alcohol or use illicit drugs. Allergies: No Known Allergies Medications Prior to Admission  Medication Sig Dispense Refill  . acetaminophen (TYLENOL) 325 MG tablet Take 1 tablet (325 mg total) by mouth every 6 (six) hours as needed for mild pain, fever or headache.      . ALPRAZolam (XANAX) 0.25 MG tablet Take 1 tablet (0.25 mg total) by mouth 3 (three) times daily as needed for anxiety. For anxiety  30 tablet  0  . cholecalciferol (VITAMIN D) 1000 UNITS tablet Take 1,000 Units by mouth daily.      . furosemide (LASIX) 20 MG tablet Take 20 mg by mouth daily as needed for edema.       [name] levofloxacin (LEVAQUIN) 500 MG tablet Take 500 mg by mouth daily. For 5 days starting on 06/09/14      . levothyroxine (SYNTHROID, LEVOTHROID) 50 MCG tablet Take 1 tablet (50 mcg total) by mouth daily.  90 tablet  3  . metoprolol succinate (TOPROL-XL) 50 MG 24 hr tablet Take 50 mg by mouth 2 (two) times daily. Take with or immediately following a meal.      . Multiple Vitamin (MULTIVITAMIN WITH MINERALS) TABS Take 1 tablet by mouth daily.      [name]  traMADol (ULTRAM) 50 MG tablet Take 0.5 tablets (25 mg total) by mouth every 6 (six) hours as needed for severe pain.  30 tablet  0  . traZODone (DESYREL) 50 MG tablet Take 25 mg by mouth at bedtime as needed for sleep.      [name] warfarin (COUMADIN) 5 MG tablet Take 5 mg by mouth daily at 6 PM.         Home: Home Living Family/patient expects to be discharged to:: Inpatient rehab Living Arrangements:  (was receiving therapy at [facility]) Available Help at Discharge:  (TBD) Type of Home: [facility] (for  short-term rehab s/p pelvic fracture) Home Access: Stairs to enter Entrance Stairs-Number of Steps: 3 Entrance Stairs-Rails: None Home Layout: One level Additional Comments: per pt's grandson.  Family would prefer pt go to CIR then home with 24 hour assistance.  They will hire assist if needed  Functional History: Prior Function Level of Independence: Needs assistance Gait / Transfers Assistance Needed: Pt was ambulating at SNF with RW.  Prior to pelvic fracture, pt was independently ambulating ADL's / Homemaking Assistance Needed: Unsure pt's level of assist at SNF, but prior to pelvic fracture, pt was independent with all BADLs and basic meal prep Comments: daughter states that prior to previous fall, patient was independent, has recently been at [facility] for rehab s/p fall and pelvic fc. Functional Status:  Mobility: Bed Mobility Overal bed mobility: Needs Assistance Bed Mobility: Supine to Sit;Sit to Supine Supine to sit: Mod assist Sit to supine: Max assist General bed mobility comments: Pt assisted with moving bil. LEs to EOB, and assisted with lifting trunk  Transfers Overall transfer level: Needs assistance Equipment used: 1 person hand held assist Transfers: Sit to/from Stand Sit to Stand: Mod assist;Max assist Stand pivot transfers: Max assist;Mod assist General transfer comment: max A, progressing to mod A.  Pt initially leaning heavily to Rt., but with facilitation and repetition, pt progressed to less assist and activated Rt trunk and LE      ADL: ADL Overall ADL's : Needs assistance/impaired Eating/Feeding: Total assistance Grooming: Wash/dry hands;Wash/dry face;Brushing hair;Moderate assistance;Sitting Upper Body Bathing: Maximal assistance;Sitting;Bed level Lower Body Bathing: Maximal assistance;Sit to/from stand Upper Body Dressing : Total assistance;Sitting Lower Body Dressing: Total assistance;Sit to/from stand Toilet Transfer: Maximal  assistance;Stand-pivot;BSC Toileting- Clothing Manipulation and Hygiene: Total assistance;Sit to/from stand Functional mobility during ADLs: Maximal assistance;Moderate assistance (max assist progressing to mod A) General ADL Comments: See comments under Rt. UE function.     Cognition: Cognition Overall Cognitive Status: Difficult to assess Arousal/Alertness: Lethargic Orientation Level: Other (comment) Attention: Focused;Sustained Focused Attention: Appears intact Sustained Attention: Impaired Sustained Attention Impairment: Verbal basic;Functional basic Comments: difficult to assess given elements of expressive/receptive aphasia, however LOA appears to impact all higher levels of function Cognition Arousal/Alertness: Awake/alert Behavior During Therapy: Flat affect Overall Cognitive Status: Difficult to assess Area of Impairment: Orientation;Attention;Memory;Following commands;Safety/judgement;Awareness;Problem solving Orientation Level: Disoriented to;Person;Place;Time;Situation Current Attention Level: Focused Following Commands: Follows one step commands inconsistently Problem Solving: Slow processing;Decreased initiation;Difficulty sequencing;Requires verbal cues;Requires tactile cues Difficult to assess due to: Impaired communication  Blood pressure 155/64, pulse 91, temperature 98.2 F (36.8 C), temperature source Oral, resp. rate 18, height 5' (1.524 m), weight 40.37 kg (89 lb), SpO2 98.00%. Physical Exam  Vitals reviewed. Constitutional:  80 year old frail old white female. Head forward posture and leaning to the right  Eyes:  Pupils reactive to light  Neck: Normal range of motion. Neck supple. No thyromegaly present.  Cardiovascular:  Cardiac rate controlled  Respiratory: Effort normal and breath sounds normal. No respiratory distress.  GI: Soft. Bowel sounds are normal. She exhibits no distension.  Neurological:  Patient is lethargic but arousable. Left gaze  preference. She was expressively and receptively aphasic with very minimal spontaneous speech. She did followed simple commands. Made eye contact when cued. Substantial right sided weakness noted particularly in the RUE but difficult to test given language issues. There was trace movement noticed in the right foot. Senses pain on the righ.   Skin: Skin is warm and dry.    Results for orders placed during the hospital encounter of 06/13/14 (from the past 24 hour(s))  GLUCOSE, CAPILLARY     Status: None   Collection Time    06/15/14  8:48 AM      Result Value Ref Range   Glucose-Capillary 84  70 - 99 mg/dL  GLUCOSE, CAPILLARY     Status: None   Collection Time    06/15/14 11:43 AM      Result Value Ref Range   Glucose-Capillary 81  70 - 99 mg/dL  GLUCOSE, CAPILLARY     Status: None   Collection Time    06/15/14  4:28 PM      Result Value Ref Range   Glucose-Capillary 84  70 - 99 mg/dL  GLUCOSE, CAPILLARY     Status: None   Collection Time    06/15/14  9:40 PM      Result Value Ref Range   Glucose-Capillary 87  70 - 99 mg/dL   Comment 1 Notify RN     Comment 2 Documented in Chart     Ct Head Wo Contrast  06/15/2014   CLINICAL DATA:  Followup post TPA hemorrhage  EXAM: CT HEAD WITHOUT CONTRAST  TECHNIQUE: Contiguous axial images were obtained from the base of the skull through the vertex without intravenous contrast.  COMPARISON:  Head CT from yesterday  FINDINGS: No new osseous or soft tissue findings. The mastoids and middle ears remain aerated.  Unchanged size and shape of an ill-defined hemorrhage within the left basal ganglia, centered in the putamen. Hematoma continues to measure up to 32 by 36 mm. Infarct in the left basal ganglia, left insula, and in the parafalcine  left occipital lobe is better visualized with increase in cytotoxic edema. Rightward midline shift is similar to prior, approximately 5 mm at the level of the foramen of [place]ro. No new infarct or hemorrhage is identified.   There is re- demonstrated generalized brain atrophy and chronic small vessel disease.  IMPRESSION: 1. Size stable hematoma centered in the left putamen, up to 36 mm. Unchanged midline shift of 5 mm. 2. Increasingly conspicuous infarct in the left basal ganglia, insula, and left occipital lobe. Despite the occipital lobe involvement, the infarct is still from the left carotid circulation - the patient has a fetal left PCA on CTA 05/17/2013.   Electronically Signed   By: Jorje Guild M.D.   On: 06/15/2014 05:12   Ct Head Wo Contrast  06/14/2014   CLINICAL DATA:  Stroke.  TPA.  EXAM: CT HEAD WITHOUT CONTRAST  TECHNIQUE: Contiguous axial images were obtained from the base of the skull through the vertex without intravenous contrast.  COMPARISON:  06/13/2014  FINDINGS: There is a new 3.6 x 3.3 cm acute parenchymal hemorrhage centered in the left lentiform nucleus. There is mild surrounding edema with mild mass effect on the left lateral ventricle and 5 mm of rightward midline shift. Patchy  hypodensities in the subcortical and deep cerebral white matter are compatible with mild chronic small vessel ischemic disease with a more focal hypoattenuation in the posterior left parietal lobe possibly reflecting evolving acute infarct. There is no extra-axial fluid collection. Cerebral volume is within normal limits for age. Visualized mastoid air cells and paranasal sinuses are clear.  IMPRESSION: New 3.6 cm left basal ganglia parenchymal hemorrhage, consistent with hemorrhagic transformation of an acute infarct. 5 mm rightward midline shift.  Critical Value/emergent results were called by telephone at the time of interpretation on 06/14/2014 at 10:58 am to Dr. Leonie Man, who verbally acknowledged these results.   Electronically Signed   By: Logan Bores   On: 06/14/2014 11:01   Dg Swallowing Func-speech Pathology  06/15/2014   Germain Osgood, CCC-SLP     06/15/2014  3:01 PM Objective Swallowing Evaluation: Modified Barium  Swallowing Study   Patient Details  Name: Christy Miranda MRN: [id number] Date of Birth: May 26, 1921  Today's Date: 06/15/2014 Time: 1884-1660 SLP Time Calculation (min): 31 min  Past Medical History:  Past Medical History  Diagnosis Date  . HTN (hypertension)   . Permanent atrial fibrillation   . Bradycardia     s/p PPM  . Hypothyroidism   . Anxiety   . Diverticulitis     history of  . History of hysterectomy   . Long-term (current) use of anticoagulants   . Scoliosis (and kyphoscoliosis), idiopathic   . DVT (deep vein thrombosis) in pregnancy     right peroneal  . Dizziness and giddiness 07/22/2013   Past Surgical History:  Past Surgical History  Procedure Laterality Date  . Pacemaker insertion  03/03/2006    most recent generator (MDT) change 10/30/06 by Dr Verlon Setting  . Cardiac catheterization  01/08/1999    normal LV function  . Cardioversion  10/30/2006    Successful elective DC cardioversion  . Cardioversion  12/29/2002    Successful DC cardioversion  . Cardioversion  12/03/1999    Successful DC cardioversion  . Cholecystectomy     HPI:  Christy Miranda is a 80 y.o. female with a history of afib on  coumadin who has recently had difficulty with regulating her INR.  She was seen to be normal at 7:15 am when her tray was taken to  her. At 8 - 8:30 when the tech went to get her tray, she noticed  that she was different and EMS was called.      Assessment / Plan / Recommendation Clinical Impression  Dysphagia Diagnosis: Moderate oral phase dysphagia;Moderate  pharyngeal phase dysphagia Clinical impression: Pt presents with a moderate sensorimotor  based oropharyngeal dysphagia. Left-sided weakness results in  anterior loss, decreased lingual manipulation, prolonged A/P  transit, and buccal pocketing. Pt's posturing paired with her  delayed onset in pharyngeal swallow result in silent aspiration  of nectar thick liquids by cup, and silent penetration by spoon.  Given aphasia, pt requires Max multimodal cues for utilization of   any compensatory strategies. Pt demonstrated flash penetration  with pureed solids and honey thick liquids via spoon, with all  penetrates clearing upon completion of the swallow. Recommend to  initiate Dys 1 textures adn honey thick liquids by spoon when pt  is fully alert.     Treatment Recommendation  Therapy as outlined in treatment plan below    Diet Recommendation Dysphagia 1 (Puree);Honey-thick liquid   Liquid Administration via: Spoon Medication Administration: Crushed with puree Supervision: Full supervision/cueing for compensatory  strategies;Patient able to self feed Compensations:  Slow rate;Small sips/bites;Check for  pocketing;Check for anterior loss Postural Changes and/or Swallow Maneuvers: Seated upright 90  degrees;Upright 30-60 min after meal;Out of bed for meals    Other  Recommendations Oral Care Recommendations: Oral care BID Other Recommendations: Order thickener from pharmacy;Prohibited  food (jello, ice cream, thin soups);Remove water pitcher   Follow Up Recommendations  Inpatient Rehab;24 hour supervision/assistance    Frequency and Duration min 2x/week  2 weeks   Pertinent Vitals/Pain n/a    SLP Swallow Goals     General Date of Onset: 06/13/14 HPI: Christy Miranda is a 80 y.o. female with a history of afib on  coumadin who has recently had difficulty with regulating her INR.  She was seen to be normal at 7:15 am when her tray was taken to  her. At 8 - 8:30 when the tech went to get her tray, she noticed  that she was different and EMS was called.  Type of Study: Modified Barium Swallowing Study Reason for Referral: Objectively evaluate swallowing function Previous Swallow Assessment: BSE 7/14 recommending NPO Diet Prior to this Study: NPO Temperature Spikes Noted: No Respiratory Status: Room air History of Recent Intubation: No Behavior/Cognition: Alert;Cooperative;Pleasant mood;Requires  cueing Oral Cavity - Dentition: Adequate natural dentition Oral Motor / Sensory Function: Impaired -  see Bedside swallow  eval Self-Feeding Abilities: Able to feed self;Needs assist Patient Positioning: Other (comment) (pt upright in chair but  with decreased head control ) Baseline Vocal Quality: Low vocal intensity Volitional Cough: Weak (elicited wtih Max multimodal cues) Volitional Swallow: Able to elicit (with Max cues) Anatomy: Other (Comment) (anterior tilt of head, daughter reports  this is increased) Pharyngeal Secretions: Not observed secondary MBS    Reason for Referral Objectively evaluate swallowing function   Oral Phase Oral Preparation/Oral Phase Oral Phase: Impaired Oral - Honey Oral - Honey Teaspoon: Right anterior bolus loss;Weak lingual  manipulation;Lingual pumping;Reduced posterior propulsion;Right  pocketing in lateral sulci;Delayed oral transit Oral - Nectar Oral - Nectar Teaspoon: Right anterior bolus loss;Weak lingual  manipulation;Lingual pumping;Reduced posterior propulsion;Right  pocketing in lateral sulci;Delayed oral transit Oral - Nectar Cup: Right anterior bolus loss;Weak lingual  manipulation;Lingual pumping;Reduced posterior propulsion;Right  pocketing in lateral sulci;Delayed oral transit Oral - Nectar Straw: Other (Comment) (pt unable to suck liquid up  through straw this afternoon) Oral - Solids Oral - Puree: Right anterior bolus loss;Weak lingual  manipulation;Lingual pumping;Reduced posterior propulsion;Right  pocketing in lateral sulci;Delayed oral transit   Pharyngeal Phase Pharyngeal Phase Pharyngeal Phase: Impaired Pharyngeal - Honey Pharyngeal - Honey Teaspoon: Delayed swallow initiation;Reduced  anterior laryngeal mobility;Reduced laryngeal  elevation;Penetration/Aspiration during swallow;Reduced  airway/laryngeal closure Penetration/Aspiration details (honey teaspoon): Material enters  airway, remains ABOVE vocal cords then ejected out Pharyngeal - Nectar Pharyngeal - Nectar Teaspoon: Delayed swallow initiation;Reduced  anterior laryngeal mobility;Reduced laryngeal   elevation;Penetration/Aspiration before  swallow;Penetration/Aspiration during swallow;Reduced  airway/laryngeal closure Penetration/Aspiration details (nectar teaspoon): Material enters  airway, remains ABOVE vocal cords and not ejected out Pharyngeal - Nectar Cup: Delayed swallow initiation;Reduced  anterior laryngeal mobility;Reduced laryngeal elevation;Reduced  airway/laryngeal closure;Penetration/Aspiration before swallow Penetration/Aspiration details (nectar cup): Material enters  airway, passes BELOW cords without attempt by patient to eject  out (silent aspiration) Pharyngeal - Solids Pharyngeal - Puree: Delayed swallow initiation;Reduced anterior  laryngeal mobility;Reduced laryngeal elevation;Reduced  airway/laryngeal closure;Penetration/Aspiration during swallow Penetration/Aspiration details (puree): Material enters airway,  remains ABOVE vocal cords then ejected out  Cervical Esophageal Phase    GO    Cervical Esophageal Phase Cervical Esophageal Phase: [facility]  Germain Osgood, M.A. 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 Does the need for close, 24 hr/day medical supervision in concert with the patient's rehab needs make it unreasonable for this patient to be served in a less intensive setting? Potentially 2. Co-Morbidities requiring supervision/potential complications: tachy brady syndrome, afib, htn,  3. Due to bladder management, bowel management, safety, skin/wound care, disease management, medication administration, pain management and patient education, does the patient require 24 hr/day rehab nursing? Potentially 4. Does the patient require coordinated care of a physician, rehab nurse, PT (1-2 hrs/day, 5 days/week), OT (1-2 hrs/day, 5 days/week) and SLP (1-2 hrs/day, 5 days/week) to address physical and functional deficits in the context of the above medical  diagnosis(es)? Potentially Addressing deficits in the following areas: balance, endurance, locomotion, strength, transferring, bowel/bladder control, bathing, dressing, feeding, grooming, toileting, cognition, speech, language, swallowing and psychosocial support 5. Can the patient actively participate in an intensive therapy program of at least 3 hrs of therapy per day at least 5 days per week? No and Potentially 6. The potential for patient to make measurable gains while on inpatient rehab is good and fair 7. Anticipated functional outcomes upon discharge from inpatient rehab are min assist  with PT, min assist and mod assist with OT, min assist and mod assist with SLP. 8. Estimated rehab length of stay to reach the above functional goals is: ?20-28 days 9. Does the patient have adequate social supports to accommodate these discharge functional goals? Potentially 10. Anticipated D/C setting: Home 11. Anticipated post D/C treatments: HH therapy and Home excercise program 12. Overall Rehab/Functional Prognosis: good and fair  RECOMMENDATIONS: This patient's condition is appropriate for continued rehabilitative care in the following setting: see below Patient has agreed to participate in recommended program. N/A Note that insurance prior authorization may be required for reimbursement for recommended care.  Comment: I am aware that the pt's family doesn't want her to go back to a nursing home, but I have major concerns over whether she can tolerate the intensity of our program. We will follow along for improved activity tolerance.   Meredith Staggers, MD, [facility]     06/16/2014

## 2014-06-17 LAB — CLOSTRIDIUM DIFFICILE BY PCR: Toxigenic C. Difficile by PCR: NEGATIVE

## 2014-06-17 NOTE — Progress Notes (Signed)
Occupational Therapy Treatment Patient Details Name: Christy Miranda MRN: [id number] DOB: 04-Oct-1921 Today's Date: 06/17/2014    History of present illness This 80 y.o. with recent pelvic fracture (WBAT) who was discharged to [facility] for rehab, was admitted with Rt sided weakness, Lt gaze preference and aphasia.  Initial head CT was negative and TPA was administered.  Repeat CT 7/15 showed Basal ganglia parenchymal hemorrhage consistent with hemorrhagic transformation of acute infart with midline shift.  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 Despite the occipital lobe involvement, the infarct is still from the left carotid circulation - the patient has a fetal left PCA on CTA 05/17/2013   OT comments  Pt continues to require 2 person assist for transfers, flaccid R UE and extensive assist with ADLs. Pt with R neglect/inattention and required multimodal cues to initiate and complete grooming tasks while seated in recliner. OT will continue to follow acutely  Follow Up Recommendations  CIR;Supervision/Assistance - 24 hour    Equipment Recommendations  3 in 1 bedside comode    Recommendations for Other Services      Precautions / Restrictions Precautions Precautions: Fall Precaution Comments: Rt sided hemiparesis  Restrictions Weight Bearing Restrictions: Yes LLE Weight Bearing: Weight bearing as tolerated Other Position/Activity Restrictions: Review of previous chart indicates that pt was WBAT after pelvic fracture       Mobility Bed Mobility Overal bed mobility: Needs Assistance Bed Mobility: Sit to Supine;Rolling Rolling: Max assist   Supine to sit: Max assist Sit to supine: Max assist   General bed mobility comments: pt with Rt sided hemiparesis; requires max (A) to bring hips to EOB and elevate trunk; pt attempting to (A) with bed mobility using Lt UE  Transfers Overall transfer  level: Needs assistance Equipment used: None Transfers: Sit to/from [facility] Sit to Stand: Max assist;+2 physical assistance Stand pivot transfers: Max assist;+2 physical assistance       General transfer comment: (A) to lift hips from bed, maintain standing, rotate hips from bed>recliner, & controlled descent.  Pt assists wtih use of LUE & LLE.      Balance Overall balance assessment: Needs assistance Sitting-balance support: Single extremity supported;Feet supported Sitting balance-Leahy Scale: Fair     Standing balance support: Single extremity supported;During functional activity Standing balance-Leahy Scale: Poor                     ADL       Grooming: Wash/dry hands;Wash/dry face;Moderate assistance;Sitting           Upper Body Dressing : Sitting;Maximal assistance       Toilet Transfer: Maximal assistance;Stand-pivot;+2 for physical assistance   Toileting- Clothing Manipulation and Hygiene: Total assistance;Sit to/from stand       Functional mobility during ADLs: Maximal assistance        Vision  R inattention/negelct                   Perception Perception Perception Deficits: Inattention/neglect Inattention/Neglect: Does not attend to right side of body;Does not attend to right visual field   Praxis      Cognition   Behavior During Therapy: Flat affect Overall Cognitive Status: No family/caregiver present to determine baseline cognitive functioning Area of Impairment: Orientation;Attention;Memory;Following commands;Safety/judgement;Awareness;Problem solving Orientation Level: Disoriented to;Person;Place;Time;Situation      Following Commands: Follows one step commands inconsistently     Problem Solving: Slow processing;Decreased initiation;Difficulty sequencing;Requires verbal  cues;Requires tactile cues      Extremity/Trunk Assessment   R UE flaccid            Exercises General Exercises - Upper  Extremity Shoulder Flexion: PROM;Right;5 reps Shoulder Extension: PROM;Right;5 reps Shoulder ABduction: PROM;Right;5 reps Shoulder ADduction: PROM;Right;5 reps Elbow Flexion: PROM;Right;5 reps Elbow Extension: PROM;Right;5 reps Wrist Flexion: PROM;Right;5 reps Wrist Extension: PROM;Right;5 reps Digit Composite Flexion: PROM;Right;5 reps Composite Extension: PROM;Right;5 reps          General Comments  pt pleasant and cooperative    Pertinent Vitals/ Pain       No c/o pain, VSS                                                          Frequency Min 3X/week     Progress Toward Goals  OT Goals(current goals can now be found in the care plan section)  Progress towards OT goals: OT to reassess next treatment  Acute Rehab OT Goals Patient Stated Goal: pt unable to state due to impaired communication  Plan Discharge plan remains appropriate                     End of Session     Activity Tolerance Patient tolerated treatment well   Patient Left with call bell/phone within reach;in chair;with chair alarm set   Nurse Communication          Time: [phone] OT Time Calculation (min): 23 min  Charges: OT General Charges $OT Visit: 1 Procedure OT Treatments $Therapeutic Activity: 8-22 mins  Britt Bottom 06/17/2014, 1:45 PM

## 2014-06-17 NOTE — Progress Notes (Signed)
Stroke Team Progress Note  HISTORY Christy Miranda is a 80 y.o. female with a history of afib on coumadin who has recently had difficulty with regulating her INR. She was seen to be normal at 7:15 am on 06/13/2014 when her tray was taken to her. At 8 - 8:30 when the tech went to get her tray, she noticed that she was different and EMS was called. On arrival to the hospital, she was found to be weak on the right with a left gaze preference and aphasia. tpa was given ?: after discussion with daughter and son the risks and benefits and fact that there is little data on patients in her age range with the 3 - 4 .5 hour window, but given the benefit seen in the population studied and severe nature of her deficits, I did offer tPA and family agreed she would want it despite risk. Following, She was admitted to the neuro ICU for further evaluation and treatment.  SUBJECTIVE Alert but aphasic. No family members present.  OBJECTIVE Most recent Vital Signs: Filed Vitals:   06/16/14 1745 06/16/14 2217 06/17/14 0115 06/17/14 0515  BP: 151/69 149/71 153/70 148/78  Pulse: 108 116 102 98  Temp: 98.3 F (36.8 C) 98.9 F (37.2 C) 98.1 F (36.7 C) 98 F (36.7 C)  TempSrc: Oral Oral Oral Oral  Resp: 18 18 16 16   Height:      Weight:      SpO2: 99% 95% 94% 94%   CBG (last 3)   Recent Labs  06/16/14 0638 06/16/14 0758 06/16/14 0831  GLUCAP 68* 72 197*    IV Fluid Intake:   . sodium chloride 75 mL/hr at 06/17/14 0617  . sodium chloride 1,000 mL (06/16/14 1809)    MEDICATIONS  . antiseptic oral rinse  15 mL Mouth Rinse q12n4p  . chlorhexidine  15 mL Mouth Rinse BID  . levothyroxine  50 mcg Oral QAC breakfast  . metoprolol succinate  50 mg Oral BID  . pantoprazole  40 mg Oral QHS   PRN:  acetaminophen, acetaminophen, ALPRAZolam, furosemide, labetalol, senna-docusate  Diet:  Dysphagia  1 with honey thick liquids Activity:  Up with assistance DVT Prophylaxis: SCDs   CLINICALLY SIGNIFICANT  STUDIES Basic Metabolic Panel:   Recent Labs Lab 06/13/14 0943 06/13/14 0947 06/13/14 1400  NA 135* 135*  --   K 5.7* 4.2 4.0  CL 98 99  --   CO2 27  --   --   GLUCOSE 98 95  --   BUN 14 15  --   CREATININE 0.42* 0.50  --   CALCIUM 8.5  --   --    Liver Function Tests:   Recent Labs Lab 06/13/14 0943  AST 43*  ALT 22  ALKPHOS 117  BILITOT 0.5  PROT 7.0  ALBUMIN 3.2*   CBC:   Recent Labs Lab 06/13/14 0943 06/13/14 0947  WBC 3.1*  --   NEUTROABS 2.1  --   HGB 11.7* 12.6  HCT 36.1 37.0  MCV 98.1  --   PLT 150  --    Coagulation:   Recent Labs Lab 06/13/14 0943  LABPROT 16.7*  INR 1.35   Cardiac Enzymes: No results found for this basename: CKTOTAL, CKMB, CKMBINDEX, TROPONINI,  in the last 168 hours Urinalysis:   Recent Labs Lab 06/13/14 1000  COLORURINE YELLOW  LABSPEC 1.016  PHURINE 7.0  GLUCOSEU NEGATIVE  HGBUR NEGATIVE  BILIRUBINUR NEGATIVE  KETONESUR NEGATIVE  PROTEINUR NEGATIVE  UROBILINOGEN 1.0  NITRITE NEGATIVE  LEUKOCYTESUR NEGATIVE   Lipid Panel    Component Value Date/Time   CHOL 132 06/14/2014 0229   TRIG 37 06/14/2014 0229   HDL 57 06/14/2014 0229   CHOLHDL 2.3 06/14/2014 0229   VLDL 7 06/14/2014 0229   LDLCALC 68 06/14/2014 0229   HgbA1C  Lab Results  Component Value Date   HGBA1C 6.1* 06/14/2014    Urine Drug Screen:     Component Value Date/Time   LABOPIA NONE DETECTED 06/13/2014 1000   COCAINSCRNUR NONE DETECTED 06/13/2014 1000   LABBENZ NONE DETECTED 06/13/2014 1000   AMPHETMU NONE DETECTED 06/13/2014 1000   THCU NONE DETECTED 06/13/2014 1000   LABBARB NONE DETECTED 06/13/2014 1000    Alcohol Level:   Recent Labs Lab 06/13/14 0943  ETH <11    Ct Head Wo Contrast 06/15/2014      1. Size stable hematoma centered in the left putamen, up to 36 mm. Unchanged midline shift of 5 mm.  2. Increasingly conspicuous infarct in the left basal ganglia, insula, and left occipital lobe. Despite the occipital lobe involvement, the  infarct is still from the left carotid circulation - the patient has a fetal left PCA on CTA 05/17/2013 .    06/14/2014      IMPRESSION: New 3.6 cm left basal ganglia parenchymal hemorrhage, consistent with hemorrhagic transformation of an acute infarct. 5 mm rightward midline shift.    06/13/2014      IMPRESSION: No acute intracranial pathology.    MRI/MRIA of the brain  pacer  Carotid Doppler  No evidence of hemodynamically significant internal carotid artery stenosis. Vertebral artery flow is antegrade.   2D Echocardiogram  Left ventricle: The cavity size was normal. Wall thickness was increased in a pattern of mild LVH. There was focal basal hypertrophy. Systolic function was normal. The estimated ejection fraction was in the range of 50% to 55%. Wall motion was normal; there were no regional wall motion abnormalities  Dg Chest Port 1 View 06/13/2014    IMPRESSION: Allowing for differences in positioning there has not been significant interval change since the previous study. Atelectasis or scarring at the lung bases is present.     EKG   . Atrial fibrillation. Right axis deviation. Borderline repolarization abnormality  Therapy Recommendations CIR  Physical Exam Blood pressure 148/78, pulse 98, temperature 98 F (36.7 C), temperature source Oral, resp. rate 16, height 5' (1.524 m), weight 89 lb (40.37 kg), SpO2 94.00%. Gen: Patient is frail elderly woman in no acute distress.  Cardiac: Irregularly irregular S1S2 audible. No M/R/G.  Extremities: Cap refill <2 secs. No cyanosis or edema. Pulses 2+ radial and DP. Pulmonary: Respirations regular, symmetric. Lungs clear to auscultation bilat. Abd: Soft, non-tender. BS audible x 4 quadrants.  G/U: Deferred  MS: awake and tracks a little follows occasional midline commands. Globally aphasic. Speech incomprehensible. gibberish Speech: Speech fluent and  mildy dysarthric. Not able to name and repeat.     CN: left gaze deviation. PERRL. EOMs  intact. Facial sensation intact V1-3. No facial droop. Hearing grossly intact.   Sternocleidomastoids and trapezius 5/5 strength. Tongue midline,   no atrophy or fasciculations.  Strength: 5/5  In left extremities proximally and distally. Right hemiparesis with 1-2/5 RUE and 3/5 RLE weakness Sensation: Intact to light touch in all four extremities.  Coordination: cannot test due to aphasia Proprioception: Negative Romberg.   Reflexes: 2+ biceps, brachioradialis bilat. 2+ patellar, achilles bilat. No clonus. Downgoing toes on left and upgoing on right  side    ASSESSMENT Christy Miranda is a 80 y.o. female presenting with altered mental status, aphasia and right hemiparesis. Status post IV t-PA 1034 on 06/13/2014 . Imaging shows left MCA branch infarct with asymptomatic post TPA hemorrhagic transformation. Infarct felt to be embolic secondary to  Atrial fibrillation with suboptimal anticoagulation.  On warfarin prior to admission, INR subtherapeutic on arrival at 1.35. Now on no anticoagulation secondary to hemorrhagic transfromation for secondary stroke prevention. Patient with resultant global aphasia, dysphagia and right hemiparesis. Stroke work up completed.dysphagi improved slightly   atrial fibrillation, not an anticoagulation candidate due to hemorrhage - Toprol-XL 50 mg twice a day for rate control.  Permanent pacemaker  LDL 68  A1c 6.4  Cachectic/frail Body mass index is 17.38 kg/(m^2).   Now on dysphagia 1 diet with honey thick liquids  Hypertension history  Family agrees to DO NOT RESUSCITATE   Hospital day # 4  TREATMENT/PLAN  Repeat CT head on Monday to evaluate for hemorrhage resolution with consideration of starting aspirin 81 mg daily  IP rehab consult - concerns regarding the patient's ability to tolerate intensity of program. They will follow.  Monitor heart rate - low 100s today however on telemetry has been 70s to 90s.  Stool pending for C. Difficile -  contact precautions.    SIGNED Mikey Bussing PA-C [facility] Pager [phone] 06/17/2014, 8:18 AM    SIGNED  I have personally examined this patient, reviewed notes, independently viewed imaging studies, participated in medical decision making and plan of care. I have made any additions or clarifications directly to the above note. Agree with note above. Patient will continue to work with rehab pending possible acceptance into CIR. Will recheck head CT Monday with consideration of starting ASA 81mg  daily if stable.   Jim Like [facility] Pager: [phone] 06/17/2014 12:34 PM   To contact Stroke Continuity provider, please refer to [email or web address] After hours, contact General Neurology

## 2014-06-17 NOTE — Progress Notes (Signed)
Physical Therapy Treatment Patient Details Name: Christy Miranda MRN: [id number] DOB: 07/26/1921 Today's Date: 06/17/2014    History of Present Illness This 80 y.o. with recent pelvic fracture (WBAT) who was discharged to [facility] for rehab, was admitted with Rt sided weakness, Lt gaze preference and aphasia.  Initial head CT was negative and TPA was administered.  Repeat CT 7/15 showed Basal ganglia parenchymal hemorrhage consistent with hemorrhagic transformation of acute infart with midline shift.  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 Despite the occipital lobe involvement, the infarct is still from the left carotid circulation - the patient has a fetal left PCA on CTA 05/17/2013    PT Comments    Pt very pleasant & willing to participate in PT session.  Increased time as pt required total assist for pericare & gown change due to incontinence in bed upon arrival.  Pt requires max assist for all mobility but gives good effort to assist as much as possible with LUE/LLE.     Follow Up Recommendations  CIR (vs SNF pending progress- familiy very hopeful for CIR)     Equipment Recommendations  Other (comment) (TBD)    Recommendations for Other Services Rehab consult     Precautions / Restrictions Precautions Precautions: Fall Precaution Comments: Rt sided hemiparesis  Restrictions LLE Weight Bearing: Weight bearing as tolerated    Mobility  Bed Mobility Overal bed mobility: Needs Assistance Bed Mobility: Sit to Supine;Rolling Rolling: Max assist   Supine to sit: Max assist     General bed mobility comments: pt with Rt sided hemiparesis; requires max (A) to bring hips to EOB and elevate trunk; pt attempting to (A) with bed mobility using Lt UE  Transfers Overall transfer level: Needs assistance Equipment used: None Transfers: Sit to/from [facility] Sit to Stand: Max  assist Stand pivot transfers: Max assist       General transfer comment: (A) to lift hips from bed, maintain standing, rotate hips from bed>recliner, & controlled descent.  Pt assists wtih use of LUE & LLE.    Ambulation/Gait                 Stairs            Wheelchair Mobility    Modified Rankin (Stroke Patients Only)       Balance                                    Cognition Arousal/Alertness: Awake/alert Behavior During Therapy: Flat affect Overall Cognitive Status: Difficult to assess         Following Commands: Follows one step commands inconsistently     Problem Solving: Slow processing;Decreased initiation;Difficulty sequencing;Requires verbal cues;Requires tactile cues      Exercises      General Comments        Pertinent Vitals/Pain No pain reported.     Home Living                      Prior Function            PT Goals (current goals can now be found in the care plan section) Acute Rehab PT Goals Patient Stated Goal: pt unable to state due to impaired communication PT Goal Formulation: With patient Time For Goal Achievement: 06/23/14 Potential to Achieve Goals: Fair Progress towards  PT goals: Progressing toward goals    Frequency  Min 3X/week    PT Plan Current plan remains appropriate    Co-evaluation PT/OT/SLP Co-Evaluation/Treatment: Yes           End of Session Equipment Utilized During Treatment: Gait belt Activity Tolerance: Patient tolerated treatment well Patient left: in chair;with chair alarm set;with call bell/phone within reach     Time: 6122-4497 PT Time Calculation (min): 23 min  Charges:  $Therapeutic Activity: 8-22 mins                    G Codes:      Sena Hitch 06/17/2014, 1:21 PM  Sarajane Marek, PTA [phone] 06/17/2014

## 2014-06-18 NOTE — Progress Notes (Signed)
Stroke Team Progress Note  HISTORY Christy Miranda is a 80 y.o. female with a history of afib on coumadin who has recently had difficulty with regulating her INR. She was seen to be normal at 7:15 am on 06/13/2014 when her tray was taken to her. At 8 - 8:30 when the tech went to get her tray, she noticed that she was different and EMS was called. On arrival to the hospital, she was found to be weak on the right with a left gaze preference and aphasia. tpa was given : after discussion with daughter and son the risks and benefits and fact that there is little data on patients in her age range with the 3 - 4 .5 hour window, but given the benefit seen in the population studied and severe nature of her deficits, I did offer tPA and family agreed she would want it despite risk. Following, She was admitted to the neuro ICU for further evaluation and treatment.  LKW: 7:15 am 06/13/2014 TPA given: Yes   SUBJECTIVE No family members present. The patient is alert but aphasic - expressive greater than receptive. Able to follow simple commands.  OBJECTIVE Most recent Vital Signs: Filed Vitals:   06/17/14 1841 06/17/14 2056 06/18/14 0144 06/18/14 0624  BP: 142/65 143/66 161/74 145/57  Pulse: 86 106 91 73  Temp: 98.4 F (36.9 C) 99.9 F (37.7 C) 99.9 F (37.7 C) 98.1 F (36.7 C)  TempSrc: Axillary Oral Oral Oral  Resp: 18 16 18 16   Height:      Weight:      SpO2: 97% 96% 95% 95%   CBG (last 3)   Recent Labs  06/16/14 0638 06/16/14 0758 06/16/14 0831  GLUCAP 68* 72 197*    IV Fluid Intake:   . sodium chloride 75 mL/hr at 06/17/14 1951  . sodium chloride 1,000 mL (06/16/14 1809)    MEDICATIONS  . antiseptic oral rinse  15 mL Mouth Rinse q12n4p  . chlorhexidine  15 mL Mouth Rinse BID  . levothyroxine  50 mcg Oral QAC breakfast  . metoprolol succinate  50 mg Oral BID  . pantoprazole  40 mg Oral QHS   PRN:  acetaminophen, acetaminophen, ALPRAZolam, furosemide, labetalol,  senna-docusate  Diet:  Dysphagia  1 with honey thick liquids Activity:  Up with assistance DVT Prophylaxis: SCDs   CLINICALLY SIGNIFICANT STUDIES Basic Metabolic Panel:   Recent Labs Lab 06/13/14 0943 06/13/14 0947 06/13/14 1400  NA 135* 135*  --   K 5.7* 4.2 4.0  CL 98 99  --   CO2 27  --   --   GLUCOSE 98 95  --   BUN 14 15  --   CREATININE 0.42* 0.50  --   CALCIUM 8.5  --   --    Liver Function Tests:   Recent Labs Lab 06/13/14 0943  AST 43*  ALT 22  ALKPHOS 117  BILITOT 0.5  PROT 7.0  ALBUMIN 3.2*   CBC:   Recent Labs Lab 06/13/14 0943 06/13/14 0947  WBC 3.1*  --   NEUTROABS 2.1  --   HGB 11.7* 12.6  HCT 36.1 37.0  MCV 98.1  --   PLT 150  --    Coagulation:   Recent Labs Lab 06/13/14 0943  LABPROT 16.7*  INR 1.35   Cardiac Enzymes: No results found for this basename: CKTOTAL, CKMB, CKMBINDEX, TROPONINI,  in the last 168 hours Urinalysis:   Recent Labs Lab 06/13/14 1000  COLORURINE YELLOW  LABSPEC 1.016  PHURINE 7.0  GLUCOSEU NEGATIVE  HGBUR NEGATIVE  BILIRUBINUR NEGATIVE  KETONESUR NEGATIVE  PROTEINUR NEGATIVE  UROBILINOGEN 1.0  NITRITE NEGATIVE  LEUKOCYTESUR NEGATIVE   Lipid Panel    Component Value Date/Time   CHOL 132 06/14/2014 0229   TRIG 37 06/14/2014 0229   HDL 57 06/14/2014 0229   CHOLHDL 2.3 06/14/2014 0229   VLDL 7 06/14/2014 0229   LDLCALC 68 06/14/2014 0229   HgbA1C  Lab Results  Component Value Date   HGBA1C 6.1* 06/14/2014    Urine Drug Screen:     Component Value Date/Time   LABOPIA NONE DETECTED 06/13/2014 1000   COCAINSCRNUR NONE DETECTED 06/13/2014 1000   LABBENZ NONE DETECTED 06/13/2014 1000   AMPHETMU NONE DETECTED 06/13/2014 1000   THCU NONE DETECTED 06/13/2014 1000   LABBARB NONE DETECTED 06/13/2014 1000    Alcohol Level:   Recent Labs Lab 06/13/14 0943  ETH <11    Ct Head Wo Contrast 06/15/2014      1. Size stable hematoma centered in the left putamen, up to 36 mm. Unchanged midline shift of 5  mm.  2. Increasingly conspicuous infarct in the left basal ganglia, insula, and left occipital lobe. Despite the occipital lobe involvement, the infarct is still from the left carotid circulation - the patient has a fetal left PCA on CTA 05/17/2013 .    06/14/2014      IMPRESSION: New 3.6 cm left basal ganglia parenchymal hemorrhage, consistent with hemorrhagic transformation of an acute infarct. 5 mm rightward midline shift.    06/13/2014      IMPRESSION: No acute intracranial pathology.    MRI/MRIA of the brain  pacer  Carotid Doppler  No evidence of hemodynamically significant internal carotid artery stenosis. Vertebral artery flow is antegrade.   2D Echocardiogram  Left ventricle: The cavity size was normal. Wall thickness was increased in a pattern of mild LVH. There was focal basal hypertrophy. Systolic function was normal. The estimated ejection fraction was in the range of 50% to 55%. Wall motion was normal; there were no regional wall motion abnormalities  Dg Chest Port 1 View 06/13/2014    IMPRESSION: Allowing for differences in positioning there has not been significant interval change since the previous study. Atelectasis or scarring at the lung bases is present.     EKG   . Atrial fibrillation. Right axis deviation. Borderline repolarization abnormality  Therapy Recommendations CIR  Physical Exam Blood pressure 145/57, pulse 73, temperature 98.1 F (36.7 C), temperature source Oral, resp. rate 16, height 5' (1.524 m), weight 89 lb (40.37 kg), SpO2 95.00%. Gen: Patient is frail elderly woman in no acute distress.  Cardiac: Irregularly irregular S1S2 audible. No M/R/G.  Extremities: Cap refill <2 secs. No cyanosis or edema. Pulses 2+ radial and DP. Pulmonary: Respirations regular, symmetric. Lungs clear to auscultation bilat. Abd: Soft, non-tender. BS audible x 4 quadrants.  G/U: Deferred  MS: awake and tracks a little follows occasional midline commands. Globally aphasic.  Speech incomprehensible. gibberish Speech: Speech fluent and  mildy dysarthric. Not able to name and repeat.     CN: left gaze deviation. PERRL. EOMs intact. Facial sensation intact V1-3. No facial droop. Hearing grossly intact.   Sternocleidomastoids and trapezius 5/5 strength. Tongue midline,   no atrophy or fasciculations.  Strength: 5/5  In left extremities proximally and distally. Right hemiparesis with 1-2/5 RUE and 3/5 RLE weakness Sensation: Intact to light touch in all four extremities.  Coordination: cannot test due to aphasia Proprioception: Negative Romberg.  Reflexes: 2+ biceps, brachioradialis bilat. 2+ patellar, achilles bilat. No clonus. Downgoing toes on left and upgoing on right side    ASSESSMENT Ms. Dietra Stokely is a 80 y.o. female presenting with altered mental status, aphasia and right hemiparesis. Status post IV t-PA 1034 on 06/13/2014 . Imaging shows left MCA branch infarct with asymptomatic post TPA hemorrhagic transformation. Infarct felt to be embolic secondary to  Atrial fibrillation with suboptimal anticoagulation.  On warfarin prior to admission, INR subtherapeutic on arrival at 1.35. Now on no anticoagulation secondary to hemorrhagic transfromation for secondary stroke prevention. Patient with resultant global aphasia, dysphagia and right hemiparesis. Stroke work up completed.dysphagi improved slightly   atrial fibrillation, not an anticoagulation candidate due to hemorrhage - Toprol-XL 50 mg twice a day for rate control.  Permanent pacemaker  LDL 68  A1c 6.4  Cachectic/frail Body mass index is 17.38 kg/(m^2).   Now on dysphagia 1 diet with honey thick liquids  Hypertension history  Family agrees to DO NOT RESUSCITATE   Hospital day # 5  TREATMENT/PLAN  Repeat CT head on Monday to evaluate for hemorrhage resolution with consideration of starting aspirin 81 mg daily  IP rehab consult - concerns regarding the patient's ability to tolerate intensity  of program. They will follow.  Monitor heart rate - low 100s today however on telemetry has been 70s to 90s.  Stool for C. Difficile - Culture negative times one - contact precautions lifted  Check labs Monday.    SIGNED Mikey Bussing PA-C [facility] Pager [phone] 06/18/2014, 8:11 AM    SIGNED  I have personally examined this patient, reviewed notes, independently viewed imaging studies, participated in medical decision making and plan of care. I have made any additions or clarifications directly to the above note. Agree with note above. Patient will continue to work with rehab pending possible acceptance into CIR. Will recheck head CT Monday with consideration of starting ASA 81mg  daily if stable.   Jim Like, DO [facility] Pager: [phone]   To contact Stroke Continuity provider, please refer to [email or web address] After hours, contact General Neurology

## 2014-06-19 ENCOUNTER — Inpatient Hospital Stay (HOSPITAL_COMMUNITY)
Admission: RE | Admit: 2014-06-19 | Discharge: 2014-07-06 | DRG: 945 | Disposition: A | Discharge status: Skilled Nursing Facility | Payer: Medicare Other | Source: Intra-hospital | Attending: Physical Medicine & Rehabilitation | Admitting: Physical Medicine & Rehabilitation

## 2014-06-19 ENCOUNTER — Inpatient Hospital Stay (HOSPITAL_COMMUNITY): Payer: Medicare Other

## 2014-06-19 DIAGNOSIS — Z9282 Status post administration of tPA (rtPA) in a different facility within the last 24 hours prior to admission to current facility: Secondary | ICD-10-CM

## 2014-06-19 DIAGNOSIS — I10 Essential (primary) hypertension: Secondary | ICD-10-CM | POA: Diagnosis present

## 2014-06-19 DIAGNOSIS — R4701 Aphasia: Secondary | ICD-10-CM | POA: Diagnosis not present

## 2014-06-19 DIAGNOSIS — E639 Nutritional deficiency, unspecified: Secondary | ICD-10-CM

## 2014-06-19 DIAGNOSIS — I69919 Unspecified symptoms and signs involving cognitive functions following unspecified cerebrovascular disease: Secondary | ICD-10-CM | POA: Diagnosis not present

## 2014-06-19 DIAGNOSIS — I63232 Cerebral infarction due to unspecified occlusion or stenosis of left carotid arteries: Secondary | ICD-10-CM

## 2014-06-19 DIAGNOSIS — Z86718 Personal history of other venous thrombosis and embolism: Secondary | ICD-10-CM

## 2014-06-19 DIAGNOSIS — R1310 Dysphagia, unspecified: Secondary | ICD-10-CM | POA: Diagnosis present

## 2014-06-19 DIAGNOSIS — K5732 Diverticulitis of large intestine without perforation or abscess without bleeding: Secondary | ICD-10-CM | POA: Diagnosis not present

## 2014-06-19 DIAGNOSIS — Z9181 History of falling: Secondary | ICD-10-CM

## 2014-06-19 DIAGNOSIS — M129 Arthropathy, unspecified: Secondary | ICD-10-CM | POA: Diagnosis not present

## 2014-06-19 DIAGNOSIS — F411 Generalized anxiety disorder: Secondary | ICD-10-CM | POA: Diagnosis present

## 2014-06-19 DIAGNOSIS — E039 Hypothyroidism, unspecified: Secondary | ICD-10-CM | POA: Diagnosis present

## 2014-06-19 DIAGNOSIS — C384 Malignant neoplasm of pleura: Secondary | ICD-10-CM | POA: Diagnosis not present

## 2014-06-19 DIAGNOSIS — Z5189 Encounter for other specified aftercare: Secondary | ICD-10-CM | POA: Diagnosis not present

## 2014-06-19 DIAGNOSIS — I639 Cerebral infarction, unspecified: Secondary | ICD-10-CM | POA: Diagnosis present

## 2014-06-19 DIAGNOSIS — L22 Diaper dermatitis: Secondary | ICD-10-CM | POA: Diagnosis not present

## 2014-06-19 DIAGNOSIS — I69920 Aphasia following unspecified cerebrovascular disease: Secondary | ICD-10-CM | POA: Diagnosis not present

## 2014-06-19 DIAGNOSIS — R488 Other symbolic dysfunctions: Secondary | ICD-10-CM | POA: Diagnosis not present

## 2014-06-19 DIAGNOSIS — Z7901 Long term (current) use of anticoagulants: Secondary | ICD-10-CM

## 2014-06-19 DIAGNOSIS — Z950 Presence of cardiac pacemaker: Secondary | ICD-10-CM | POA: Diagnosis not present

## 2014-06-19 DIAGNOSIS — R1312 Dysphagia, oropharyngeal phase: Secondary | ICD-10-CM | POA: Diagnosis not present

## 2014-06-19 DIAGNOSIS — I6350 Cerebral infarction due to unspecified occlusion or stenosis of unspecified cerebral artery: Secondary | ICD-10-CM | POA: Diagnosis present

## 2014-06-19 DIAGNOSIS — M4120 Other idiopathic scoliosis, site unspecified: Secondary | ICD-10-CM | POA: Diagnosis present

## 2014-06-19 DIAGNOSIS — G8190 Hemiplegia, unspecified affecting unspecified side: Secondary | ICD-10-CM | POA: Diagnosis not present

## 2014-06-19 DIAGNOSIS — R4789 Other speech disturbances: Secondary | ICD-10-CM | POA: Diagnosis not present

## 2014-06-19 DIAGNOSIS — I619 Nontraumatic intracerebral hemorrhage, unspecified: Secondary | ICD-10-CM | POA: Diagnosis not present

## 2014-06-19 DIAGNOSIS — R32 Unspecified urinary incontinence: Secondary | ICD-10-CM | POA: Diagnosis not present

## 2014-06-19 DIAGNOSIS — I4891 Unspecified atrial fibrillation: Secondary | ICD-10-CM | POA: Diagnosis not present

## 2014-06-19 DIAGNOSIS — M6281 Muscle weakness (generalized): Secondary | ICD-10-CM | POA: Diagnosis not present

## 2014-06-19 DIAGNOSIS — Z79899 Other long term (current) drug therapy: Secondary | ICD-10-CM | POA: Diagnosis not present

## 2014-06-19 DIAGNOSIS — H109 Unspecified conjunctivitis: Secondary | ICD-10-CM | POA: Diagnosis not present

## 2014-06-19 DIAGNOSIS — I69959 Hemiplegia and hemiparesis following unspecified cerebrovascular disease affecting unspecified side: Secondary | ICD-10-CM | POA: Diagnosis not present

## 2014-06-19 DIAGNOSIS — I6330 Cerebral infarction due to thrombosis of unspecified cerebral artery: Secondary | ICD-10-CM | POA: Diagnosis not present

## 2014-06-19 DIAGNOSIS — S32509A Unspecified fracture of unspecified pubis, initial encounter for closed fracture: Secondary | ICD-10-CM | POA: Diagnosis not present

## 2014-06-19 DIAGNOSIS — E0789 Other specified disorders of thyroid: Secondary | ICD-10-CM | POA: Diagnosis not present

## 2014-06-19 DIAGNOSIS — I6990 Unspecified sequelae of unspecified cerebrovascular disease: Secondary | ICD-10-CM | POA: Diagnosis not present

## 2014-06-19 LAB — BASIC METABOLIC PANEL
ANION GAP: 12 (ref 5–15)
BUN: 13 mg/dL (ref 6–23)
CALCIUM: 7.9 mg/dL — AB (ref 8.4–10.5)
CHLORIDE: 106 meq/L (ref 96–112)
CO2: 23 mEq/L (ref 19–32)
CREATININE: 0.33 mg/dL — AB (ref 0.50–1.10)
GFR calc Af Amer: >90 mL/min (ref >90)
Glucose, Bld: 92 mg/dL (ref 70–99)
Potassium: 3.7 mEq/L (ref 3.7–5.3)
Sodium: 141 mEq/L (ref 137–147)

## 2014-06-19 LAB — CBC
HCT: 33.1 % — ABNORMAL LOW (ref 36.0–46.0)
Hemoglobin: 10.8 g/dL — ABNORMAL LOW (ref 12.0–15.0)
MCH: 31.2 pg (ref 26.0–34.0)
MCHC: 32.6 g/dL (ref 30.0–36.0)
MCV: 95.7 fL (ref 78.0–100.0)
PLATELETS: 114 10*3/uL — AB (ref 150–400)
RBC: 3.46 MIL/uL — ABNORMAL LOW (ref 3.87–5.11)
RDW: 16.5 % — AB (ref 11.5–15.5)
WBC: 3.8 10*3/uL — ABNORMAL LOW (ref 4.0–10.5)

## 2014-06-19 MED ORDER — ALPRAZOLAM 0.25 MG PO TABS
0.2500 mg | ORAL_TABLET | Freq: Three times a day (TID) | ORAL | Status: DC | PRN
  Administered 2014-06-19 – 2014-07-06 (×9): 0.25 mg via ORAL
  Filled 2014-06-19 (×9): qty 1

## 2014-06-19 MED ORDER — ACETAMINOPHEN 325 MG PO TABS
325.0000 mg | ORAL_TABLET | ORAL | Status: DC | PRN
Start: 2014-06-19 — End: 2014-07-06
  Administered 2014-06-21 – 2014-07-04 (×9): 650 mg via ORAL
  Filled 2014-06-19 (×10): qty 2

## 2014-06-19 MED ORDER — HEPARIN SODIUM (PORCINE) 5000 UNIT/ML IJ SOLN: 5000.0000 [IU] | Freq: Two times a day (BID) | INTRAMUSCULAR | Status: DC

## 2014-06-19 MED ORDER — LEVOTHYROXINE SODIUM 50 MCG PO TABS
50.0000 ug | ORAL_TABLET | Freq: Every day | ORAL | Status: DC
Start: 2014-06-20 — End: 2014-07-06
  Administered 2014-06-20 – 2014-07-06 (×17): 50 ug via ORAL
  Filled 2014-06-19 (×18): qty 1

## 2014-06-19 MED ORDER — PANTOPRAZOLE SODIUM 40 MG PO TBEC
40.0000 mg | DELAYED_RELEASE_TABLET | Freq: Every day | ORAL | Status: DC
  Administered 2014-06-19 – 2014-06-23 (×5): 40 mg via ORAL
  Filled 2014-06-19 (×6): qty 1

## 2014-06-19 MED ORDER — SODIUM CHLORIDE 0.9 % IV SOLN
INTRAVENOUS | Status: DC
  Administered 2014-06-19: 21:00:00 via INTRAVENOUS

## 2014-06-19 MED ORDER — SENNOSIDES-DOCUSATE SODIUM 8.6-50 MG PO TABS
1.0000 | ORAL_TABLET | Freq: Every evening | ORAL | Status: DC | PRN
  Administered 2014-07-04: 1 via ORAL
  Filled 2014-06-19: qty 1

## 2014-06-19 MED ORDER — SORBITOL 70 % SOLN: 30.0000 mL | Freq: Every day | Status: DC | PRN

## 2014-06-19 MED ORDER — ONDANSETRON HCL 4 MG/2ML IJ SOLN: 4.0000 mg | Freq: Four times a day (QID) | INTRAMUSCULAR | Status: DC | PRN

## 2014-06-19 MED ORDER — BIOTENE DRY MOUTH MT LIQD
15.0000 mL | Freq: Two times a day (BID) | OROMUCOSAL | Status: DC
  Administered 2014-06-20 – 2014-07-06 (×21): 15 mL via OROMUCOSAL

## 2014-06-19 MED ORDER — METOPROLOL SUCCINATE ER 50 MG PO TB24
50.0000 mg | ORAL_TABLET | Freq: Two times a day (BID) | ORAL | Status: DC
  Administered 2014-06-19 – 2014-06-24 (×10): 50 mg via ORAL
  Filled 2014-06-19 (×13): qty 1

## 2014-06-19 MED ORDER — ONDANSETRON HCL 4 MG PO TABS: 4.0000 mg | ORAL_TABLET | Freq: Four times a day (QID) | ORAL | Status: DC | PRN

## 2014-06-19 MED ORDER — CHLORHEXIDINE GLUCONATE 0.12 % MT SOLN
15.0000 mL | Freq: Two times a day (BID) | OROMUCOSAL | Status: DC
  Administered 2014-06-19 – 2014-07-06 (×32): 15 mL via OROMUCOSAL
  Filled 2014-06-19 (×36): qty 15

## 2014-06-19 NOTE — Progress Notes (Signed)
[facility] Admission Coordinator Signed Physical Medicine and Rehabilitation PMR Pre-admission Service date: 06/19/2014 2:25 PM  Related encounter: Admission (Current) from 06/13/2014 in [place]   PMR Admission Coordinator Pre-Admission Assessment  Patient: Christy Miranda is an 80 y.o., female  MRN: [id number]  DOB: 01-Nov-1921  Height: 5' (152.4 cm)  Weight: 40.37 kg (89 lb)  Insurance Information  HMO: PPO: PCP: IPA: 80/20: OTHER:  PRIMARY: Medicare A & B Policy#: [id number] d Subscriber: self  Pre-Cert#: Employer: retired  [employer], broadcasting/film[employer]video. Date: A & B: 05-31-1988 Deduct: $1260 Out of Pocket Max: none Life Max: unlimited  CIR: 100% SNF: 100% days 1-20; 80% days 21-100 (100 day visit max)  Outpatient: 80% Co-Pay: 20%  Home Health: 100% Co-Pay: none, no visit limits  DME: 80% Co-Pay: 20%  Providers: pt's preference   SECONDARY: BCBS PPO Out of State Policy#: [id number] Subscriber: self  Benefits: Phone #: [phone]  Emergency Contact Information    Contact Information     Name  Relation  Home  Work  [employer]  Daughter  [phone]   Maybee    [phone]        Current Medical History  Patient Admitting Diagnosis: left basal ganglia infarct with hemorrhagic transformation  History of Present Illness: Christy Miranda is a 80 y.o. right-handed female with history of atrial fibrillation status post pacemaker with chronic Coumadin therapy, recent fall with pelvic fractures and had recently been at skilled nursing facility secondary to pelvic fractures.. Admitted 06/13/2049 with right-sided weakness as well as aphasia. INR on admission of 1.35. Initial cranial CT scan negative for acute changes. Patient did receive TPA. Followup cranial CT scan showed no 3.6 cm left basal ganglia parenchymal hemorrhage consistent with hemorrhagic transformation of acute infarct. 5 mm rightward midline shift.  Echocardiogram with ejection fraction of 55% no wall motion abnormalities. Carotid Dopplers with no ICA stenosis. Neurology services consulted with workup ongoing. Aspirin held for post TPA hemorrhage. Modified barium swallow 06/15/2014 maintained on a dysphagia 1 honey thick liquid diet. Physical occupational therapy evaluations completed with recommendations of physical medicine rehabilitation consult.  NIH Total: 12  Past Medical History    Past Medical History    Diagnosis  Date    .  HTN (hypertension)     .  Permanent atrial fibrillation     .  Bradycardia       s/p PPM    .  Hypothyroidism     .  Anxiety     .  Diverticulitis       history of    .  History of hysterectomy     .  Long-term (current) use of anticoagulants     .  Scoliosis (and kyphoscoliosis), idiopathic     .  DVT (deep vein thrombosis) in pregnancy       right peroneal    .  Dizziness and giddiness  07/22/2013     Family History  family history includes Cancer - Other in her father; Coronary artery disease in her mother.  Prior Rehab/Hospitalizations: no prior rehab until recent fall/pelvic fracture on Father's Day and was at SNF for rehab prior to CVA (approx 2 1/2 weeks in SNF)  Current Medications  Current facility-administered medications:acetaminophen (TYLENOL) suppository 650 mg, 650 mg, Rectal, Q4H PRN, Roland Rack, MD, 650 mg at 06/13/14 1606; acetaminophen (TYLENOL) tablet 650 mg, 650 mg, Oral, Q4H PRN, Roland Rack, MD; ALPRAZolam (  XANAX) tablet 0.25 mg, 0.25 mg, Oral, TID PRN, Donzetta Starch, NP; antiseptic oral rinse (BIOTENE) solution 15 mL, 15 mL, Mouth Rinse, q12n4p, Antony Contras, MD, 15 mL at 06/19/14 1225  chlorhexidine (PERIDEX) 0.12 % solution 15 mL, 15 mL, Mouth Rinse, BID, Antony Contras, MD, 15 mL at 06/19/14 1027; furosemide (LASIX) tablet 20 mg, 20 mg, Oral, Daily PRN, Donzetta Starch, NP; labetalol (NORMODYNE,TRANDATE) injection 10 mg, 10 mg, Intravenous, Q10 min PRN, Roland Rack, MD, 10 mg at 06/13/14 1706; levothyroxine (SYNTHROID, LEVOTHROID) tablet 50 mcg, 50 mcg, Oral, QAC breakfast, Donzetta Starch, NP, 50 mcg at 06/19/14 1027  metoprolol succinate (TOPROL-XL) 24 hr tablet 50 mg, 50 mg, Oral, BID, Donzetta Starch, NP, 50 mg at 06/19/14 1027; pantoprazole (PROTONIX) EC tablet 40 mg, 40 mg, Oral, QHS, Georgina Peer, RPH, 40 mg at 06/18/14 2147; senna-docusate (Senokot-S) tablet 1 tablet, 1 tablet, Oral, QHS PRN, Marliss Coots, PA-C  Patients Current Diet: Dysphagia level 1, honey thick liquids. Meds crushed in puree, liquids by spoon only. Watch for right sided pocketing and spillage.  Precautions / Restrictions  Precautions  Precautions: Fall  Precaution Comments: Rt sided hemiparesis, aphasia  Restrictions  Weight Bearing Restrictions: Yes  LLE Weight Bearing: Weight bearing as tolerated  Other Position/Activity Restrictions: Review of previous chart indicates that pt was WBAT after pelvic fracture  Prior Activity Level  Limited Community (1-2x/wk): pt got out on limited errands with dtr, pt never learned to drive (lived in [place] most of her life), enjoyed housework and talked to sister on phone daily  [place] / Equipment  Home Assistive Devices/Equipment: Environmental consultant (specify type)  Prior Functional Level  Prior Function  Level of Independence: Needs assistance  Gait / Transfers Assistance Needed: Pt was ambulating at SNF with RW. Prior to pelvic fracture, pt was independently ambulating  ADL's / Homemaking Assistance Needed: Unsure pt's level of assist at SNF, but prior to pelvic fracture, pt was independent with all BADLs and basic meal prep  Comments: daughter states that prior to previous fall, patient was independent, has recently been at [facility] for rehab s/p fall and pelvic fc.  Current Functional Level    Cognition  Arousal/Alertness: Lethargic  Overall Cognitive Status: No family/caregiver present to determine baseline  cognitive functioning  Difficult to assess due to: Impaired communication  Current Attention Level: Focused  Orientation Level: Other (comment) (unable to assess)  Following Commands: Follows one step commands inconsistently  Attention: Focused;Sustained  Focused Attention: Appears intact  Sustained Attention: Impaired  Sustained Attention Impairment: Verbal basic;Functional basic  Comments: difficult to assess given elements of expressive/receptive aphasia, however LOA appears to impact all higher levels of function    Extremity Assessment  (includes Sensation/Coordination)      ADLs  Overall ADL's : Needs assistance/impaired  Eating/Feeding: Total assistance  Grooming: Wash/dry hands;Wash/dry face;Moderate assistance;Sitting  Upper Body Bathing: Maximal assistance;Sitting;Bed level  Lower Body Bathing: Maximal assistance;Sit to/from stand  Upper Body Dressing : Sitting;Maximal assistance  Lower Body Dressing: Total assistance;Sit to/from stand  Toilet Transfer: Maximal assistance;Stand-pivot;+2 for physical assistance  Toileting- Clothing Manipulation and Hygiene: Total assistance;Sit to/from stand  Functional mobility during ADLs: Maximal assistance  General ADL Comments: See comments under Rt. UE function.    Mobility  Overal bed mobility: Needs Assistance  Bed Mobility: Supine to Sit  Rolling: Max assist  Supine to sit: Total assist  Sit to supine: Max assist  General bed mobility comments: pt with minimal assist of  LUE to assist with scooting to EOB and elevating trunk with use of pad to move pelvis forward and constant assist for trunk anterior translation as reverts to posterior lean with severe kyphosis    Transfers  Overall transfer level: Needs assistance  Equipment used: None  Transfers: Sit to/from [facility]  Sit to Stand: Max assist  Stand pivot transfers: Total assist;+2 safety/equipment  General transfer comment: max assist to stand from bed and  transfer to [facility]. BSC high and required 2 people to stand again and step back to [facility] to position sacrum on toilet due to elevation. Repeated 2 additional stands with max assist and total assist to pivot BSC to recliner with knees blocked and pt very minimally trying to advance feet with total assist for pericare in standing    Ambulation / Gait / Stairs / Wheelchair Mobility  Ambulation/Gait  General Gait Details: will require 2 person (A)    Posture / Balance  Dynamic Sitting Balance  Sitting balance - Comments: progresing to fair; pt with kyphotic posture and Rt cervical rotation and lt sidebend; focused on trunk control sitting EOB with upright psoture and head in midline; required (A) to maintain; tolerated sitting ~8 min at EOB    Special needs/care consideration  BiPAP/CPAP no  CPM no  Continuous Drip IV no  Dialysis no  Life Vest no  Oxygen no  Special Bed no  Trach Size no  Wound Vac (area) no  Skin - no issues  Bowel mgmt: last BM on 06-18-14, incontinence  Bladder mgmt: urinary incontinence  Diabetic mgmt no    Previous Home Environment  Living Arrangements: (was receiving therapy at [facility])  Available Help at Discharge: (TBD)  Type of Home: [place] (for short-term rehab s/p pelvic fracture)  Care Facility Name: [facility]  Home Layout: One level  Home Access: Stairs to enter  Entrance Stairs-Rails: None  Entrance Stairs-Number of Steps: 3  Home Care Services: No  Additional Comments: per pt's grandson. Family would prefer pt go to CIR then home with 24 hour assistance. They will hire assist if needed  Discharge Living Setting  Plans for Discharge Living Setting: Patient's home  Type of Home at Discharge: House  Discharge Home Layout: One level  Discharge Home Access: Stairs to enter  Entrance Stairs-Rails: Can reach both  Entrance Stairs-Number of Steps: 3  Does the patient have any problems obtaining your medications?: No  Social/Family/Support  Systems  Patient Roles: (was living home alone indep.prior to fall on Father's Day)  Contact Information: dtr Bethena Roys is primary contact  Anticipated Caregiver: dtr Bethena Roys and they plan to hire caregivers when Bethena Roys has to work  Anticipated Ambulance person Information: see above  Ability/Limitations of Caregiver: dtr works part time as a Radio broadcast assistant, works 3 days/week. The plan is to hire caregivers for home if needed or to consider SNF.  Caregiver Availability: Intermittent (dtr plans to hire caregivers around her work schedule or SNF)  Discharge Plan Discussed with Primary Caregiver: Yes  Is Caregiver In Agreement with Plan?: Yes  Does Caregiver/Family have Issues with Lodging/Transportation while Pt is in Rehab?: No  Goals/Additional Needs  Patient/Family Goal for Rehab: Minimal assist with PT, Min-Mod assist with OT and SLP  Expected length of stay: 7-10 days depending on pt's activity tolerance and functional gains per Dr. Naaman Plummer  Cultural Considerations: none  Dietary Needs: Dys 1, honey thick, meds crushed in puree, liquids by spoon only, watch for right sided pocketing and spillage  Equipment Needs: to be determined  Pt/Family Agrees to Admission and willing to participate: Yes (spoke with dtr Bethena Roys on 7-17 and 7-20)  Program Orientation Provided & Reviewed with Pt/Caregiver Including Roles & Responsibilities: Yes  Decrease burden of Care through IP rehab admission: NA  Possible need for SNF placement upon discharge: Possible, depends on pt's overall progress with activity. Dr. Naaman Plummer indicated initial length of stay for 7-10 days, depending on how pt progresses with therapies and her activity tolerance. SNF may be a consideration if pt remains lower level. However, pt's daughter Bethena Roys also stated they would consider hiring private caregivers for home.  Patient Condition: This patient's medical and functional status has changed since the consult dated: 06-16-14 in which the Rehabilitation  Physician determined and documented that the patient's condition is appropriate for intensive rehabilitative care in an inpatient rehabilitation facility. See "History of Present Illness" (above) for medical update. Functional changes are: maximal assist of 2 for limited transfer to chair and maximal assistance with ADL tasks. Patient's medical and functional status update has been discussed with the Rehabilitation physician and patient remains appropriate for inpatient rehabilitation. Will admit to inpatient rehab today.  Preadmission Screen Completed By: Nanetta Batty, PT 06/19/2014 2:39 PM  ______________________________________________________________________  Discussed status with Dr. Naaman Plummer on 06-19-14 at 1435 and received telephone approval for admission today.  Admission Coordinator: Sherlyn Hay Nghia Mcentee,PT, time 1435/Date7-20-15    Cosigned by: Meredith Staggers, MD [06/19/2014 3:28 PM]

## 2014-06-19 NOTE — Progress Notes (Signed)
Physical Therapy Treatment Patient Details Name: Christy Miranda MRN: [id number] DOB: Apr 26, 1921 Today's Date: 06/19/2014    History of Present Illness This 80 y.o. with recent pelvic fracture (WBAT) who was discharged to [facility] for rehab, was admitted with Rt sided weakness, Lt gaze preference and aphasia.  Initial head CT was negative and TPA was administered.  Repeat CT 7/15 showed Basal ganglia parenchymal hemorrhage consistent with hemorrhagic transformation of acute infart with midline shift.  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 Despite the occipital lobe involvement, the infarct is still from the left carotid circulation - the patient has a fetal left PCA on CTA 05/17/2013    PT Comments    Pt with aphasia and difficulty following commands as well as communicating needs. Pt soiled on arrival with gesturing of further need to void and assisted to [facility]. Pt extreme kyphosis makes transfers more difficult due to stature and posture. Pt tolerating transfers but not yet making considerable gains in function. Will continue to follow to maximize function, strength and ability.   Follow Up Recommendations  CIR (if pt continues to have limited tolerance and gains may need to consider SNF)     Equipment Recommendations       Recommendations for Other Services       Precautions / Restrictions Precautions Precautions: Fall Precaution Comments: Rt sided hemiparesis, aphasia Restrictions LLE Weight Bearing: Weight bearing as tolerated Other Position/Activity Restrictions: Review of previous chart indicates that pt was WBAT after pelvic fracture    Mobility  Bed Mobility Overal bed mobility: Needs Assistance Bed Mobility: Supine to Sit     Supine to sit: Total assist     General bed mobility comments: pt with minimal assist of LUE to assist with scooting to EOB and elevating trunk with use of  pad to move pelvis forward and constant assist for trunk anterior translation as reverts to posterior lean with severe kyphosis  Transfers Overall transfer level: Needs assistance   Transfers: Sit to/from Stand;Stand Pivot Transfers Sit to Stand: Max assist Stand pivot transfers: Total assist;+2 safety/equipment       General transfer comment: max assist to stand from bed and transfer to [facility]. BSC high and required 2 people to stand again and step back to [facility] to position sacrum on toilet due to elevation. Repeated 2 additional stands with max assist and total assist to pivot BSC to recliner with knees blocked and pt very minimally trying to advance feet with total assist for pericare in standing  Ambulation/Gait                 Stairs            Wheelchair Mobility    Modified Rankin (Stroke Patients Only) Modified Rankin (Stroke Patients Only) Pre-Morbid Rankin Score: Moderately severe disability Modified Rankin: Severe disability     Balance Overall balance assessment: Needs assistance   Sitting balance-Leahy Scale: Poor       Standing balance-Leahy Scale: Zero                      Cognition Arousal/Alertness: Awake/alert Behavior During Therapy: Flat affect Overall Cognitive Status: No family/caregiver present to determine baseline cognitive functioning         Following Commands: Follows one step commands inconsistently     Problem Solving: Slow processing;Decreased initiation;Difficulty sequencing;Requires verbal cues;Requires tactile cues      Exercises General Exercises -  Lower Extremity Long Arc Quad: AROM;Both;10 reps;Seated (max tactile and verbal cues for RLE)    General Comments        Pertinent Vitals/Pain No signs/symptoms, unable to rate or report    Home Living                      Prior Function            PT Goals (current goals can now be found in the care plan section) Progress towards PT goals: Not  progressing toward goals - comment    Frequency       PT Plan Current plan remains appropriate    Co-evaluation             End of Session Equipment Utilized During Treatment: Gait belt Activity Tolerance: Patient limited by fatigue Patient left: in chair;with call bell/phone within reach;with chair alarm set;with nursing/sitter in room     Time: 1204-1230 PT Time Calculation (min): 26 min  Charges:  $Therapeutic Activity: 23-37 mins                    G Codes:      Melford Aase 07-19-2014, 12:38 PM Elwyn Reach, [place]

## 2014-06-19 NOTE — Progress Notes (Addendum)
Pt is admitted [phone] . Patient's sister is at the bedside. Admission vital is stable.

## 2014-06-19 NOTE — H&P (View-Only) (Signed)
Physical Medicine and Rehabilitation Admission H&P    Chief Complaint  Patient presents with  . Code Stroke  :  Chief complaint: Weakness  HPI: Christy Miranda is a 80 y.o. right-handed female with history of atrial fibrillation status post pacemaker with chronic Coumadin therapy, recent fall with pelvic fractures and had recently been at skilled nursing facility secondary to pelvic fractures.. Admitted 06/13/2049 with right-sided weakness as well as aphasia. INR on admission of 1.35. Initial cranial CT scan negative for acute changes. Patient did receive TPA. Followup cranial CT scan showed no 3.6 cm left basal ganglia parenchymal hemorrhage consistent with hemorrhagic transformation of acute infarct. 5 mm rightward midline shift. Echocardiogram with ejection fraction of 55% no wall motion abnormalities. Carotid Dopplers with no ICA stenosis. Neurology services consulted with workup ongoing. Aspirin held for post TPA hemorrhage. Repeat cranial CT scan 06/19/2014 shows slight interval decrease in size of hematoma centered in the left putamen, measuring 30 x 28 mm. Midline shift of 6 mm not significantly changed. No plan to begin aspirin at this time and repeat scan in one week. Modified barium swallow 06/15/2014 maintained on a dysphagia 1 honey thick liquid diet. Physical occupational therapy evaluations completed with recommendations of physical medicine rehabilitation consult. Patient was admitted for comprehensive rehabilitation program   ROS Review of Systems  Unable to perform ROS: language   Past Medical History  Diagnosis Date  . HTN (hypertension)   . Permanent atrial fibrillation   . Bradycardia     s/p PPM  . Hypothyroidism   . Anxiety   . Diverticulitis     history of  . History of hysterectomy   . Long-term (current) use of anticoagulants   . Scoliosis (and kyphoscoliosis), idiopathic   . DVT (deep vein thrombosis) in pregnancy     right peroneal  . Dizziness and  giddiness 07/22/2013   Past Surgical History  Procedure Laterality Date  . Pacemaker insertion  03/03/2006    most recent generator (MDT) change 10/30/06 by Dr Verlon Setting  . Cardiac catheterization  01/08/1999    normal LV function  . Cardioversion  10/30/2006    Successful elective DC cardioversion  . Cardioversion  12/29/2002    Successful DC cardioversion  . Cardioversion  12/03/1999    Successful DC cardioversion  . Cholecystectomy     Family History  Problem Relation Age of Onset  . Coronary artery disease Mother   . Cancer - Other Father     Pancreatic Cancer   Social History:  reports that she has never smoked. She has never used smokeless tobacco. She reports that she does not drink alcohol or use illicit drugs. Allergies: No Known Allergies Medications Prior to Admission  Medication Sig Dispense Refill  . acetaminophen (TYLENOL) 325 MG tablet Take 1 tablet (325 mg total) by mouth every 6 (six) hours as needed for mild pain, fever or headache.      . ALPRAZolam (XANAX) 0.25 MG tablet Take 1 tablet (0.25 mg total) by mouth 3 (three) times daily as needed for anxiety. For anxiety  30 tablet  0  . cholecalciferol (VITAMIN D) 1000 UNITS tablet Take 1,000 Units by mouth daily.      . furosemide (LASIX) 20 MG tablet Take 20 mg by mouth daily as needed for edema.       [name] levofloxacin (LEVAQUIN) 500 MG tablet Take 500 mg by mouth daily. For 5 days starting on 06/09/14      . levothyroxine (SYNTHROID, LEVOTHROID) 50  MCG tablet Take 1 tablet (50 mcg total) by mouth daily.  90 tablet  3  . metoprolol succinate (TOPROL-XL) 50 MG 24 hr tablet Take 50 mg by mouth 2 (two) times daily. Take with or immediately following a meal.      . Multiple Vitamin (MULTIVITAMIN WITH MINERALS) TABS Take 1 tablet by mouth daily.      . traMADol (ULTRAM) 50 MG tablet Take 0.5 tablets (25 mg total) by mouth every 6 (six) hours as needed for severe pain.  30 tablet  0  . traZODone (DESYREL) 50 MG tablet Take 25  mg by mouth at bedtime as needed for sleep.      . warfarin (COUMADIN) 5 MG tablet Take 5 mg by mouth daily at 6 PM.         Home: Home Living Family/patient expects to be discharged to:: Inpatient rehab Living Arrangements:  (was receiving therapy at [facility]) Available Help at Discharge:  (TBD) Type of Home: Skilled Nursing Facility (for short-term rehab s/p pelvic fracture) Home Access: Stairs to enter Entrance Stairs-Number of Steps: 3 Entrance Stairs-Rails: None Home Layout: One level Additional Comments: per pt's grandson.  Family would prefer pt go to CIR then home with 24 hour assistance.  They will hire assist if needed   Functional History: Prior Function Level of Independence: Needs assistance Gait / Transfers Assistance Needed: Pt was ambulating at SNF with RW.  Prior to pelvic fracture, pt was independently ambulating ADL's / Homemaking Assistance Needed: Unsure pt's level of assist at SNF, but prior to pelvic fracture, pt was independent with all BADLs and basic meal prep Comments: daughter states that prior to previous fall, patient was independent, has recently been at [facility] for rehab s/p fall and pelvic fc.  Functional Status:  Mobility: Bed Mobility Overal bed mobility: Needs Assistance Bed Mobility: Sit to Supine;Rolling Rolling: Max assist Supine to sit: Max assist Sit to supine: Max assist General bed mobility comments: pt with Rt sided hemiparesis; requires max (A) to bring hips to EOB and elevate trunk; pt attempting to (A) with bed mobility using Lt UE Transfers Overall transfer level: Needs assistance Equipment used: None Transfers: Sit to/from Stand;Stand Pivot Transfers Sit to Stand: Max assist;+2 physical assistance Stand pivot transfers: Max assist;+2 physical assistance General transfer comment: (A) to lift hips from bed, maintain standing, rotate hips from bed>recliner, & controlled descent.  Pt assists wtih use of LUE & LLE.     Ambulation/Gait General Gait Details: will require 2 person (A)     ADL: ADL Overall ADL's : Needs assistance/impaired Eating/Feeding: Total assistance Grooming: Wash/dry hands;Wash/dry face;Moderate assistance;Sitting Upper Body Bathing: Maximal assistance;Sitting;Bed level Lower Body Bathing: Maximal assistance;Sit to/from stand Upper Body Dressing : Sitting;Maximal assistance Lower Body Dressing: Total assistance;Sit to/from stand Toilet Transfer: Maximal assistance;Stand-pivot;+2 for physical assistance Toileting- Clothing Manipulation and Hygiene: Total assistance;Sit to/from stand Functional mobility during ADLs: Maximal assistance General ADL Comments: See comments under Rt. UE function.     Cognition: Cognition Overall Cognitive Status: No family/caregiver present to determine baseline cognitive functioning Arousal/Alertness: Lethargic Orientation Level: Other (comment) (unable to assess) Attention: Focused;Sustained Focused Attention: Appears intact Sustained Attention: Impaired Sustained Attention Impairment: Verbal basic;Functional basic Comments: difficult to assess given elements of expressive/receptive aphasia, however LOA appears to impact all higher levels of function Cognition Arousal/Alertness: Awake/alert Behavior During Therapy: Flat affect Overall Cognitive Status: No family/caregiver present to determine baseline cognitive functioning Area of Impairment: Orientation;Attention;Memory;Following commands;Safety/judgement;Awareness;Problem solving Orientation Level: Disoriented to;Person;Place;Time;Situation Current Attention Level: Focused   Following Commands: Follows one step commands inconsistently Problem Solving: Slow processing;Decreased initiation;Difficulty sequencing;Requires verbal cues;Requires tactile cues Difficult to assess due to: Impaired communication  Physical Exam: Blood pressure 134/56, pulse 89, temperature 99 F (37.2 C), temperature  source Oral, resp. rate 18, height 5' (1.524 m), weight 40.37 kg (89 lb), SpO2 97.00%. Physical Exam Constitutional:  79 year old frail white female. Head forward posture and leaning to the right. Brighter today  Eyes:  Pupils reactive to light  Neck: Normal range of motion. Neck supple. No thyromegaly present.  Cardiovascular:  Cardiac rate controlled. No murmur  Respiratory: Effort normal and breath sounds normal. No respiratory distress.  GI: Soft. Bowel sounds are normal. She exhibits no distension. Non-tender Neurological:  Patient is alert.  Left gaze preference. She was expressively and receptively aphasic with apraxia.  She did follow a few simple commands. Made eye contact when cued. Substantial right sided weakness noted. RUE: 1/5 deltoid, bicep, tricep, 0/5 wrist/hand. RLE: 1+ HF, KE, and 2- ADF/PF but inconsistent. Sensation diminished but senses pinch right arm/leg. No resting tone, reflexes tr.  Skin: Skin is warm and dry Psych: flat, calm, collected  Results for orders placed during the hospital encounter of 06/13/14 (from the past 48 hour(s))  BASIC METABOLIC PANEL     Status: Abnormal   Collection Time    06/19/14  6:19 AM      Result Value Ref Range   Sodium 141  137 - 147 mEq/L   Potassium 3.7  3.7 - 5.3 mEq/L   Chloride 106  96 - 112 mEq/L   CO2 23  19 - 32 mEq/L   Glucose, Bld 92  70 - 99 mg/dL   BUN 13  6 - 23 mg/dL   Creatinine, Ser 0.33 (*) 0.50 - 1.10 mg/dL   Calcium 7.9 (*) 8.4 - 10.5 mg/dL   GFR calc non Af Amer >90  >90 mL/min   GFR calc Af Amer >90  >90 mL/min   Comment: (NOTE)     The eGFR has been calculated using the CKD EPI equation.     This calculation has not been validated in all clinical situations.     eGFR's persistently <90 mL/min signify possible Chronic Kidney     Disease.   Anion gap 12  5 - 15  CBC     Status: Abnormal   Collection Time    06/19/14  6:19 AM      Result Value Ref Range   WBC 3.8 (*) 4.0 - 10.5 K/uL   RBC 3.46 (*)  3.87 - 5.11 MIL/uL   Hemoglobin 10.8 (*) 12.0 - 15.0 g/dL   HCT 33.1 (*) 36.0 - 46.0 %   MCV 95.7  78.0 - 100.0 fL   MCH 31.2  26.0 - 34.0 pg   MCHC 32.6  30.0 - 36.0 g/dL   RDW 16.5 (*) 11.5 - 15.5 %   Platelets 114 (*) 150 - 400 K/uL   Comment: PLATELET COUNT CONFIRMED BY SMEAR   Ct Head Wo Contrast  06/19/2014   CLINICAL DATA:  Followup hemorrhagic stroke.  EXAM: CT HEAD WITHOUT CONTRAST  TECHNIQUE: Contiguous axial images were obtained from the base of the skull through the vertex without intravenous contrast.  COMPARISON:  Prior CT from 06/14/2014  FINDINGS: Study is somewhat limiting due to patient positioning.  Ill defined intraparenchymal hemorrhage centered at the left putamen is slightly decreased in size measuring approximately 30 x 28 mm. Adjacent hemorrhage within the left external  capsule/insula is similar to prior. Associated vasogenic edema is overall not significantly changed. Mass effect on the adjacent left lateral ventricle with associated 6 mm of left-to-right midline shift at the level of the foramen of [place] is similar to prior. No new infarct or hemorrhage.  Cytotoxic edema within the left occipital lobe is slightly more well-defined as compared to prior exam.  Generalized atrophy with chronic small vessel ischemic disease again noted, unchanged.  No new osseous abnormality. Paranasal sinuses and mastoid air cells remain clear.  IMPRESSION: 1. Slight interval decrease in size of hematoma centered in the left putamen, measuring 30 x 28 mm. Midline shift of 6 mm not significantly changed. 2. Continued interval evolution of infarcts involving the left basal ganglia, insula, and left occipital lobe. 3. No new intracranial hemorrhage or infarct.   Electronically Signed   By: Jeannine Boga M.D.   On: 06/19/2014 00:51       Medical Problem List and Plan: 1. 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 No aspirin at this time  repeat CT scan in one week to decide upon initiation of aspirin 2.  DVT Prophylaxis/Anticoagulation: SCDs. Monitor for any signs of DVT 3. Pain Management: Tylenol as needed 4. Mood/anxiety. Xanax 0.25 mg 3 times a day as needed. Provide emotional support:  5. Neuropsych: This patient is not capable of making decisions on her own behalf. 6. Dysphagia. Dysphagia 1 with honey thick liquids. Continue IV fluids for hydration. Followup chemistries 7. History of atrial fibrillation/pacemaker with chronic Coumadin. Coumadin held secondary to hemorrhagic transformation. Cardiac rate controlled. Continue Toprol 50 mg twice a day for rate control 8. Recent pelvic fracture. Weightbearing as tolerated. 9. Hypothyroidism. Synthroid  Post Admission Physician Evaluation: 1. 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 2. Patient is admitted to receive collaborative, interdisciplinary care between the physiatrist, rehab nursing staff, and therapy team. 3. Patient's level of medical complexity and substantial therapy needs in context of that medical necessity cannot be provided at a lesser intensity of care such as a SNF. 4. Patient has experienced substantial functional loss from his/her baseline which was documented above under the "Functional History" and "Functional Status" headings.  Judging by the patient's diagnosis, physical exam, and functional history, the patient has potential for functional progress which will result in measurable gains while on inpatient rehab.  These gains will be of substantial and practical use upon discharge  in facilitating mobility and self-care at the household level. 5. Physiatrist will provide 24 hour management of medical needs as well as oversight of the therapy plan/treatment and provide guidance as appropriate regarding the interaction of the two. 6. 24 hour rehab nursing will assist with bladder management, bowel management,  safety, skin/wound care, disease management, medication administration, pain management and patient education  and help integrate therapy concepts, techniques,education, etc. 7. PT will assess and treat for/with: Lower extremity strength, range of motion, stamina, balance, functional mobility, safety, adaptive techniques and equipment, NMR, caregiver education, exercise tolerance, cognitive linguistic rx.   Goals are: mod assist. 8. OT will assess and treat for/with: ADL's, functional mobility, safety, upper extremity strength, adaptive techniques and equipment, NMR, cognitive perceptual awareness, stamina, visual spatial awareness.   Goals are: mod assist. 9. SLP will assess and treat for/with: speech, swallow, language, cognition.  Goals are: mod to max assist. 10. Case Management and Social Worker will assess and treat for psychological issues and discharge planning. 11. Team conference will be  held weekly to assess progress toward goals and to determine barriers to discharge. 12. Patient will receive at least 3 hours of therapy per day at least 5 days per week. 13. ELOS: 10-20+ days depending upon neurological progress and activity tolerance       14. Prognosis:  good     Meredith Staggers, MD, [facility]   06/19/2014

## 2014-06-19 NOTE — Progress Notes (Signed)
Rehab admissions - I spoke with Deirdre, neuro NP who stated that pt is medically cleared for inpatient rehab. Bed is available and will admit later today to inpatient rehab.  Completed admission paperwork with pt's daughter and also met pt's sister. Pt has aphasia and was limited in her communication with me.  I updated PT and OT as well. Rn and case manager Courtney also aware of plan for inpatient rehab today.  Please call me with any questions. Thanks.   , PT Rehabilitation Admissions Coordinator [phone]  

## 2014-06-19 NOTE — Progress Notes (Signed)
VASCULAR LAB PRELIMINARY  PRELIMINARY  PRELIMINARY  PRELIMINARY  Transcranial Dopplers completed.    Preliminary report:  Transcranial Doppler completed. Technically difficult due to poor and absent windows.  Audrianna Driskill, RVS 06/19/2014, 11:24 AM

## 2014-06-19 NOTE — Discharge Summary (Signed)
Stroke Discharge Summary  Patient ID: Christy Miranda   MRN: [id number]      DOB: 01-03-1921  Date of Admission: 06/13/2014 Date of Discharge: 06/19/2014  Attending Physician:  Rosalin Hawking, MD, Stroke MD  Consulting Physician(s):  Treatment Team:  Md Stroke, MD rehabilitation medicine: Alger Simons, MD  Patient's PCP:  [name], MD  Discharge Diagnoses:  Principal Problem:   CVA (cerebral infarction): Acute L MCA infarct affecting L basal ganglia, insula, and occipital lobe with asymptomatic post tPA hemorrhagic conversion, etiology embolic secondary to atrial fibrillation on subtherapeutic anticoagulation  Active Problems:   Atrial fibrillation   Tachycardia-bradycardia syndrome   Hypertension   Current use of long term anticoagulation   Subtherapeutic international normalized ratio (INR)   Undernutrition BMI  Body mass index is 17.38 kg/(m^2).   Past Medical History  Diagnosis Date  . HTN (hypertension)   . Permanent atrial fibrillation   . Bradycardia     s/p PPM  . Hypothyroidism   . Anxiety   . Diverticulitis     history of  . History of hysterectomy   . Long-term (current) use of anticoagulants   . Scoliosis (and kyphoscoliosis), idiopathic   . DVT (deep vein thrombosis) in pregnancy     right peroneal  . Dizziness and giddiness 07/22/2013   Past Surgical History  Procedure Laterality Date  . Pacemaker insertion  03/03/2006    most recent generator (MDT) change 10/30/06 by Dr Verlon Setting  . Cardiac catheterization  01/08/1999    normal LV function  . Cardioversion  10/30/2006    Successful elective DC cardioversion  . Cardioversion  12/29/2002    Successful DC cardioversion  . Cardioversion  12/03/1999    Successful DC cardioversion  . Cholecystectomy      Medications to be continued on Rehab levothyroxine  50 mcg  Oral  QAC breakfast   metoprolol succinate  50 mg  Oral BID    pantoprazole  40 mg  Oral  QHS    PRN: acetaminophen, acetaminophen,  ALPRAZolam, furosemide, labetalol, senna-docusate      LABORATORY STUDIES CBC    Component Value Date/Time   WBC 3.8* 06/19/2014 0619   RBC 3.46* 06/19/2014 0619   HGB 10.8* 06/19/2014 0619   HCT 33.1* 06/19/2014 0619   PLT 114* 06/19/2014 0619   MCV 95.7 06/19/2014 0619   MCH 31.2 06/19/2014 0619   MCHC 32.6 06/19/2014 0619   RDW 16.5* 06/19/2014 0619   LYMPHSABS 0.6* 06/13/2014 0943   MONOABS 0.3 06/13/2014 0943   EOSABS 0.0 06/13/2014 0943   BASOSABS 0.0 06/13/2014 0943   CMP    Component Value Date/Time   NA 141 06/19/2014 0619   K 3.7 06/19/2014 0619   CL 106 06/19/2014 0619   CO2 23 06/19/2014 0619   GLUCOSE 92 06/19/2014 0619   BUN 13 06/19/2014 0619   CREATININE 0.33* 06/19/2014 0619   CALCIUM 7.9* 06/19/2014 0619   PROT 7.0 06/13/2014 0943   ALBUMIN 3.2* 06/13/2014 0943   AST 43* 06/13/2014 0943   ALT 22 06/13/2014 0943   ALKPHOS 117 06/13/2014 0943   BILITOT 0.5 06/13/2014 0943   GFRNONAA >90 06/19/2014 0619   GFRAA >90 06/19/2014 0619   COAGS Lab Results  Component Value Date   INR 1.35 06/13/2014   INR 1.73* 05/25/2014   INR 2.16* 05/24/2014   Lipid Panel    Component Value Date/Time   CHOL 132 06/14/2014 0229   TRIG 37 06/14/2014 0229  HDL 57 06/14/2014 0229   CHOLHDL 2.3 06/14/2014 0229   VLDL 7 06/14/2014 0229   LDLCALC 68 06/14/2014 0229   HgbA1C  Lab Results  Component Value Date   HGBA1C 6.1* 06/14/2014   Cardiac Panel (last 3 results) No results found for this basename: CKTOTAL, CKMB, TROPONINI, RELINDX,  in the last 72 hours Urinalysis    Component Value Date/Time   COLORURINE YELLOW 06/13/2014 1000   APPEARANCEUR CLEAR 06/13/2014 1000   LABSPEC 1.016 06/13/2014 1000   PHURINE 7.0 06/13/2014 1000   GLUCOSEU NEGATIVE 06/13/2014 1000   HGBUR NEGATIVE 06/13/2014 1000   BILIRUBINUR NEGATIVE 06/13/2014 1000   KETONESUR NEGATIVE 06/13/2014 1000   PROTEINUR NEGATIVE 06/13/2014 1000   UROBILINOGEN 1.0 06/13/2014 1000   NITRITE NEGATIVE 06/13/2014 1000   LEUKOCYTESUR  NEGATIVE 06/13/2014 1000   Urine Drug Screen     Component Value Date/Time   LABOPIA NONE DETECTED 06/13/2014 1000   COCAINSCRNUR NONE DETECTED 06/13/2014 1000   LABBENZ NONE DETECTED 06/13/2014 1000   AMPHETMU NONE DETECTED 06/13/2014 1000   THCU NONE DETECTED 06/13/2014 1000   LABBARB NONE DETECTED 06/13/2014 1000    Alcohol Level    Component Value Date/Time   [facility] <11 06/13/2014 0943     SIGNIFICANT DIAGNOSTIC STUDIES Ct Head Wo Contrast  06/19/14  IMPRESSION:  1. Slight interval decrease in size of hematoma centered in the left  putamen, measuring 30 x 28 mm. Midline shift of 6 mm not  significantly changed.  2. Continued interval evolution of infarcts involving the left basal  ganglia, insula, and left occipital lobe.  3. No new intracranial hemorrhage or infarct.  06/15/2014  1. Size stable hematoma centered in the left putamen, up to 36 mm. Unchanged midline shift of 5 mm.  2. Increasingly conspicuous infarct in the left basal ganglia, insula, and left occipital lobe. Despite the occipital lobe involvement, the infarct is still from the left carotid circulation - the patient has a fetal left PCA on CTA 05/17/2013  .  06/14/2014  IMPRESSION: New 3.6 cm left basal ganglia parenchymal hemorrhage, consistent with hemorrhagic transformation of an acute infarct. 5 mm rightward midline shift.  06/13/2014  IMPRESSION: No acute intracranial pathology.  MRI/MRIA of the brain Unable to do due to pacemaker  Carotid Doppler 06/15/14 No evidence of hemodynamically significant internal carotid artery stenosis. Vertebral artery flow is antegrade.  2D Echocardiogram 06/13/14 Left ventricle: The cavity size was normal. Wall thickness was increased in a pattern of mild LVH. There was focal basal hypertrophy. Systolic function was normal. The estimated ejection fraction was in the range of 50% to 55%. Wall motion was normal; there were no regional wall motion abnormalities  Dg Chest Port 1 View   06/13/2014 IMPRESSION: Allowing for differences in positioning there has not been significant interval change since the previous study. Atelectasis or scarring at the lung bases is present.  EKG 06/13/14 Atrial fibrillation. Right axis deviation. Borderline repolarization abnormality  Transcranial Dopplers 06/19/14 absence of windows    History of Present Illness    Christy Miranda is a 80 y.o. female with a history of afib on coumadin who has recently had difficulty with regulating her INR. She was seen to be normal at 7:15 am on 06/13/2014 when her tray was taken to her. At 8 - 8:30 when the tech went to get her tray, she noticed that she was different and EMS was called. On arrival to the hospital, she was found to be weak on the right  with a left gaze preference and aphasia. tpa was given : after discussion with daughter and son the risks and benefits and fact that there is little data on patients in her age range with the 3 - 4 .5 hour window, but given the benefit seen in the population studied and severe nature of her deficits, I did offer tPA and family agreed she would want it despite risk.  LKW: 7:15 am 06/13/2014  TPA given: Yes   Hospital Course   Stroke: Acute L MCA infarct affecting L basal ganglia, insula, and occipital lobe with asymptomatic post tPA hemorrhagic conversion, etiology embolic secondary to atrial fibrillation on subtherapeutic anticoagulation Following IV tPA administration the patient was admitted to the neuro ICU for further evaluation and treatment under  post TPA protocol. On 06/14/14 repeat CT showed hemorrhagic transformation in the L basal ganglia and 5 mm midline shift. On 06/15/14 repeat CT showed stable hemorrhage and unchanged midline shift, as well as evolving L MCA infarct in L basal ganglia, insula, and occipital lobe. Patient was not able to have MRI due to pacemaker. EKG showed afib. Infarcts were believed to be embolic secondary to atrial fibrillation on  coumadin with subtherapeutic INR. Carotid Dopplers and 2D Echo were unremarkable. The patient was stable for transfer to the Neuro floor on 06/15/14. On 06/19/14, repeat CT continued to show hemorrhage, so continued holding antiplatelet. LDL was 68, held statin due to hemorrhage. HgbA1C was 6.1. Patient was evaluated and accepted for Inpatient Rehab and was transferred.    Pt needs repeat CT head in about one week 06/26/14. If hematoma resolved, pt may start on ASA full dose daily.  Follow up in clinic in 3-4 weeks regarding resuming coumadin.  Hypertension, Atrial Fibrillation, Tachy/Brady Syndrome, Edema  Not able to start anticoagulation due to hemorrhagic transformation.  Patient previously on furosemide 20 mg daily, metoprolol succinate 50 mg BID, and warfarin 5 mg daily at home  Currently on metoprolol succinate 50 mg BID in hospital  BP 134-172/56-72 and HR 83-92 in last 24 hours. Currently SBP goal <160, but in the long run her SBP goal 120-140. Gradually bring SBP done to normal within 5-7 days.  Holding coumadin due to hemorrhage, will see patient for follow up in Stroke Clinic in 3-4 weeks and will decide whether to resume anticoagulation at that time  Undernutrition  Cachectic/frail Body mass index is 17.38 kg/(m^2).  Now on dysphagia 1 diet with honey thick liquids  Encourage oral intake  Patient with vascular risk factors of:   Atrial fibrillation   Hypertension  Patient also with:  Undernutrition  Patient with resultant global aphasia, dysphagia and right hemiparesis. Physical therapy, occupational therapy and speech therapy evaluated patient. All agreed inpatient rehab is needed. Patient's family are supportive and can provide care at discharge. CIR bed is available today and patient will be transferred there.  Discharge Exam  Blood pressure 172/65, pulse 84, temperature 99.2 F (37.3 C), temperature source Oral, resp. rate 18, height 5' (1.524 m), weight 40.37 kg (89 lb),  SpO2 99.00%.   Gen: Patient is frail elderly woman in no acute distress.  Cardiac: Irregularly irregular S1S2 audible. No M/R/G.  Extremities: Cap refill <2 secs. No cyanosis or edema. Pulses 2+ radial and DP.  Pulmonary: Respirations regular, symmetric. Lungs clear to auscultation bilat.  Abd: Soft, non-tender. BS audible x 4 quadrants.  G/U: Deferred  MS: Alert and responsive, aphasic. Follows only command to stick out tongue.  Speech: Speech mildy dysarthric. Mixed  receptive and expressive aphasia. Not able to name or repeat.  CN: R field cut. Left gaze preference. PERRL. R facial droop. Hearing grossly intact. Sternocleidomastoids and trapezius 5/5 strength. Tongue midline, no atrophy or fasciculations.  Strength: 5/5 In left extremities proximally and distally. 4/5 RUE and 4/5 RLE weakness  Sensation: not cooperative for the test.  Coordination: cannot test due to aphasia  Reflexes: 1+ biceps, brachioradialis bilat. 1+ patellar, achilles bilat. No clonus. Downgoing toes on left and upgoing on right side.  Discharge Diet  Dysphagia 3 Honey Thick liquids  Discharge Plan  Disposition:  Transfer to [facility] for ongoing PT, OT and ST  See above for plan for antithrombotic and statin therapy  Recommend ongoing risk factor control by Primary Care Physician at time of discharge from inpatient rehabilitation.  Risk factor recommendations: Long term BP goal SBP 120-140.   Follow-up WOLTERS,SHARON A, MD in 2 weeks following discharge from rehab.  Follow-up with Dr. Rosalin Hawking, Stroke Clinic in 1 month.  35 minutes were spent preparing discharge.  Signed Delbert Phenix, MSN, ANP-C, CNRN, MSCS [facility] Stroke Team [phone]  I, the attending vascular neurologist, have made any additions or clarifications directly to the above note and agree with the findings and plan as currently documented.   Rosalin Hawking, MD PhD 06/19/2014 10:50 PM

## 2014-06-19 NOTE — PMR Pre-admission (Signed)
PMR Admission Coordinator Pre-Admission Assessment  Patient: Christy Miranda is an 80 y.o., female MRN: [id number] DOB: 09/09/1921 Height: 5' (152.4 cm) Weight: 40.37 kg (89 lb)              Insurance Information HMO:     PPO:      PCP:      IPA:      80/20:      OTHER:  PRIMARY: Medicare A & B      Policy#: [id number]      Subscriber: self Pre-Cert#:       Employer: retired [employer]. Date: A & B: 05-31-1988     Deduct: $1260      Out of Pocket Max: none      Life Max: unlimited CIR: 100%      SNF: 100% days 1-20; 80% days 21-100 (100 day visit max) Outpatient: 80%     Co-Pay: 20% Home Health: 100%      Co-Pay: none, no visit limits DME: 80%     Co-Pay: 20% Providers: pt's preference  SECONDARY: BCBS PPO Out of State      Policy#: [id number]      Subscriber: self Benefits:  Phone #: [phone]       Emergency Contact Information Contact Information   Name Relation Home Work [place] Daughter [phone]  [place]   [phone]     Current Medical History  Patient Admitting Diagnosis: left basal ganglia infarct with hemorrhagic transformation  History of Present Illness: Neriah Brott is a 80 y.o. right-handed female with history of atrial fibrillation status post pacemaker with chronic Coumadin therapy, recent fall with pelvic fractures and had recently been at skilled nursing facility secondary to pelvic fractures.. Admitted 06/13/2049 with right-sided weakness as well as aphasia. INR on admission of 1.35. Initial cranial CT scan negative for acute changes. Patient did receive TPA. Followup cranial CT scan showed no 3.6 cm left basal ganglia parenchymal hemorrhage consistent with hemorrhagic transformation of acute infarct. 5 mm rightward midline shift. Echocardiogram with ejection fraction of 55% no wall motion abnormalities. Carotid Dopplers with no ICA stenosis. Neurology services consulted with workup ongoing. Aspirin held for post TPA  hemorrhage. Modified barium swallow 06/15/2014 maintained on a dysphagia 1 honey thick liquid diet. Physical occupational therapy evaluations completed with recommendations of physical medicine rehabilitation consult.  NIH Total: 12  Past Medical History  Past Medical History  Diagnosis Date  . HTN (hypertension)   . Permanent atrial fibrillation   . Bradycardia     s/p PPM  . Hypothyroidism   . Anxiety   . Diverticulitis     history of  . History of hysterectomy   . Long-term (current) use of anticoagulants   . Scoliosis (and kyphoscoliosis), idiopathic   . DVT (deep vein thrombosis) in pregnancy     right peroneal  . Dizziness and giddiness 07/22/2013    Family History  family history includes Cancer - Other in her father; Coronary artery disease in her mother.  Prior Rehab/Hospitalizations: no prior rehab until recent fall/pelvic fracture on Father's Day and was at SNF for rehab prior to CVA (approx 2 1/2 weeks in SNF)   Current Medications  Current facility-administered medications:acetaminophen (TYLENOL) suppository 650 mg, 650 mg, Rectal, Q4H PRN, Roland Rack, MD, 650 mg at 06/13/14 1606;  acetaminophen (TYLENOL) tablet 650 mg, 650 mg, Oral, Q4H PRN, Roland Rack, MD;  ALPRAZolam Duanne Moron) tablet 0.25 mg, 0.25 mg, Oral, TID PRN, Donzetta Starch, NP;  antiseptic oral rinse (BIOTENE) solution 15 mL, 15 mL, Mouth Rinse, q12n4p, Antony Contras, MD, 15 mL at 06/19/14 1225 chlorhexidine (PERIDEX) 0.12 % solution 15 mL, 15 mL, Mouth Rinse, BID, Antony Contras, MD, 15 mL at 06/19/14 1027;  furosemide (LASIX) tablet 20 mg, 20 mg, Oral, Daily PRN, Donzetta Starch, NP;  labetalol (NORMODYNE,TRANDATE) injection 10 mg, 10 mg, Intravenous, Q10 min PRN, Roland Rack, MD, 10 mg at 06/13/14 1706;  levothyroxine (SYNTHROID, LEVOTHROID) tablet 50 mcg, 50 mcg, Oral, QAC breakfast, Donzetta Starch, NP, 50 mcg at 06/19/14 1027 metoprolol succinate (TOPROL-XL) 24 hr tablet 50 mg, 50 mg, Oral,  BID, Donzetta Starch, NP, 50 mg at 06/19/14 1027;  pantoprazole (PROTONIX) EC tablet 40 mg, 40 mg, Oral, QHS, Georgina Peer, RPH, 40 mg at 06/18/14 2147;  senna-docusate (Senokot-S) tablet 1 tablet, 1 tablet, Oral, QHS PRN, Marliss Coots, PA-C  Patients Current Diet: Dysphagia level 1, honey thick liquids. Meds crushed in puree, liquids by spoon only. Watch for right sided pocketing and spillage.  Precautions / Restrictions Precautions Precautions: Fall Precaution Comments: Rt sided hemiparesis, aphasia Restrictions Weight Bearing Restrictions: Yes LLE Weight Bearing: Weight bearing as tolerated Other Position/Activity Restrictions: Review of previous chart indicates that pt was WBAT after pelvic fracture   Prior Activity Level Limited Community (1-2x/wk): pt got out on limited errands with dtr, pt never learned to drive (lived in [place] most of her life), enjoyed housework and talked to sister on phone daily  [place] / Equipment Home Assistive Devices/Equipment: Environmental consultant (specify type)  Prior Functional Level Prior Function Level of Independence: Needs assistance Gait / Transfers Assistance Needed: Pt was ambulating at SNF with RW.  Prior to pelvic fracture, pt was independently ambulating ADL's / Homemaking Assistance Needed: Unsure pt's level of assist at SNF, but prior to pelvic fracture, pt was independent with all BADLs and basic meal prep Comments: daughter states that prior to previous fall, patient was independent, has recently been at [facility] for rehab s/p fall and pelvic fc.  Current Functional Level Cognition  Arousal/Alertness: Lethargic Overall Cognitive Status: No family/caregiver present to determine baseline cognitive functioning Difficult to assess due to: Impaired communication Current Attention Level: Focused Orientation Level: Other (comment) (unable to assess) Following Commands: Follows one step commands inconsistently Attention:  Focused;Sustained Focused Attention: Appears intact Sustained Attention: Impaired Sustained Attention Impairment: Verbal basic;Functional basic Comments: difficult to assess given elements of expressive/receptive aphasia, however LOA appears to impact all higher levels of function    Extremity Assessment (includes Sensation/Coordination)          ADLs  Overall ADL's : Needs assistance/impaired Eating/Feeding: Total assistance Grooming: Wash/dry hands;Wash/dry face;Moderate assistance;Sitting Upper Body Bathing: Maximal assistance;Sitting;Bed level Lower Body Bathing: Maximal assistance;Sit to/from stand Upper Body Dressing : Sitting;Maximal assistance Lower Body Dressing: Total assistance;Sit to/from stand Toilet Transfer: Maximal assistance;Stand-pivot;+2 for physical assistance Toileting- Clothing Manipulation and Hygiene: Total assistance;Sit to/from stand Functional mobility during ADLs: Maximal assistance General ADL Comments: See comments under Rt. UE function.       Mobility  Overal bed mobility: Needs Assistance Bed Mobility: Supine to Sit Rolling: Max assist Supine to sit: Total assist Sit to supine: Max assist General bed mobility comments: pt with minimal assist of LUE to assist with scooting to EOB and elevating trunk with use of pad to move pelvis forward and constant assist for trunk anterior translation as reverts to posterior lean with severe kyphosis    Transfers  Overall transfer level: Needs assistance Equipment  used: None Transfers: Sit to/from [employer] to Stand: Max assist Stand pivot transfers: Total assist;+2 safety/equipment General transfer comment: max assist to stand from bed and transfer to [facility]. BSC high and required 2 people to stand again and step back to [facility] to position sacrum on toilet due to elevation. Repeated 2 additional stands with max assist and total assist to pivot BSC to recliner with knees blocked and pt very  minimally trying to advance feet with total assist for pericare in standing    Ambulation / Gait / Stairs / Wheelchair Mobility  Ambulation/Gait General Gait Details: will require 2 person (A)     Posture / Balance Dynamic Sitting Balance Sitting balance - Comments: progresing to fair; pt with kyphotic posture and Rt cervical rotation and lt sidebend; focused on trunk control sitting EOB with upright psoture and head in midline; required (A) to maintain; tolerated sitting ~8 min at EOB    Special needs/care consideration BiPAP/CPAP no CPM no  Continuous Drip IV no  Dialysis no         Life Vest no  Oxygen no  Special Bed no  Trach Size no  Wound Vac (area) no       Skin - no issues                               Bowel mgmt: last BM on 06-18-14, incontinence Bladder mgmt: urinary incontinence  Diabetic mgmt no   Previous Home Environment Living Arrangements:  (was receiving therapy at [facility]) Available Help at Discharge:  (TBD) Type of Home: [place] (for short-term rehab s/p pelvic fracture) Care Facility Name: [facility] Home Layout: One level Home Access: Stairs to enter Entrance Stairs-Rails: None Entrance Stairs-Number of Steps: 3 Home Care Services: No Additional Comments: per pt's grandson.  Family would prefer pt go to CIR then home with 24 hour assistance.  They will hire assist if needed  Discharge Living Setting Plans for Discharge Living Setting: Patient's home Type of Home at Discharge: House Discharge Home Layout: One level Discharge Home Access: Stairs to enter Entrance Stairs-Rails: Can reach both Entrance Stairs-Number of Steps: 3 Does the patient have any problems obtaining your medications?: No  Social/Family/Support Systems Patient Roles:  (was living home alone indep.prior to fall on Father's Day) Contact Information: dtr Bethena Roys is primary contact Anticipated Caregiver: dtr Bethena Roys and they plan to hire caregivers when Bethena Roys has to  work Anticipated Ambulance person Information: see above Ability/Limitations of Caregiver: dtr works part time as a Radio broadcast assistant, works 3 days/week. The plan is to hire caregivers for home if needed or to consider SNF. Caregiver Availability: Intermittent (dtr plans to hire caregivers around her work schedule or SNF) Discharge Plan Discussed with Primary Caregiver: Yes Is Caregiver In Agreement with Plan?: Yes Does Caregiver/Family have Issues with Lodging/Transportation while Pt is in Rehab?: No  Goals/Additional Needs Patient/Family Goal for Rehab: Minimal assist with PT, Min-Mod assist with OT and SLP Expected length of stay: 7-10 days depending on pt's activity tolerance and functional gains per Dr. Naaman Plummer Cultural Considerations: none Dietary Needs: Dys 1, honey thick, meds crushed in puree, liquids by spoon only, watch for right sided pocketing and spillage Equipment Needs: to be determined Pt/Family Agrees to Admission and willing to participate: Yes (spoke with dtr Bethena Roys on 7-17 and 7-20) Program Orientation Provided & Reviewed with Pt/Caregiver Including Roles  & Responsibilities: Yes   Decrease burden of  Care through IP rehab admission: NA   Possible need for SNF placement upon discharge: Possible, depends on pt's overall progress with activity. Dr. Naaman Plummer indicated initial length of stay for 7-10 days, depending on how pt progresses with therapies and her activity tolerance. SNF may be a consideration if pt remains lower level. However, pt's daughter Bethena Roys also stated they would consider hiring private caregivers for home.    Patient Condition: This patient's medical and functional status has changed since the consult dated: 06-16-14 in which the Rehabilitation Physician determined and documented that the patient's condition is appropriate for intensive rehabilitative care in an inpatient rehabilitation facility. See "History of Present Illness" (above) for medical update. Functional  changes are: maximal assist of 2 for limited transfer to chair and maximal assistance with ADL tasks. Patient's medical and functional status update has been discussed with the Rehabilitation physician and patient remains appropriate for inpatient rehabilitation. Will admit to inpatient rehab today.  Preadmission Screen Completed By:  Nanetta Batty, PT 06/19/2014 2:39 PM ______________________________________________________________________   Discussed status with Dr. Naaman Plummer on 06-19-14 at 1435 and received telephone approval for admission today.  Admission Coordinator:  Sherlyn Hay Graesyn Schreifels,PT, time 1435/Date7-20-15

## 2014-06-19 NOTE — Progress Notes (Signed)
Rehab admissions - We are following pt's case and rehab MD is willing to try initial inpatient rehab stay of 7-10 days and see how pt's overall activity tolerance does and her functional progress. I explained this to pt's daughter and dtr was very glad for this opportunity for her mom to receive intensive acute rehab at this time.  I then called and updated Deidre, neuro NP and now await neuro clearance. Bed is available and could admit pt to inpatient rehab later today, pending medical clearance from neuro.   Christy Miranda, case manager is aware as well as pt's dtr and Therapist, sports. I will keep the pt/family and medical team aware of any updates as I wait for neuro clearance.  Please call me with any questions. Thanks.  Christy Miranda, PT Rehabilitation Admissions Coordinator [phone]

## 2014-06-19 NOTE — Progress Notes (Signed)
Meredith Staggers, MD Physician Signed Physical Medicine and Rehabilitation Consult Note Service date: 06/16/2014 6:07 AM  Related encounter: Admission (Current) from 06/13/2014 in [place]           Physical Medicine and Rehabilitation Consult Reason for Consult: CVA Referring Physician: Dr. Leonie Man     HPI: Christy Miranda is a 80 y.o. right-handed female with history of atrial fibrillation status post pacemaker with chronic Coumadin therapy, recent fall with pelvic fractures and had recently been at skilled nursing facility secondary to pelvic fractures.. Admitted 06/13/2049 with right-sided weakness as well as aphasia. INR on admission of 1.35. Initial cranial CT scan negative for acute changes. Patient did receive TPA. Followup cranial CT scan showed no 3.6 cm left basal ganglia parenchymal hemorrhage consistent with hemorrhagic transformation of acute infarct. 5 mm rightward midline shift. Echocardiogram with ejection fraction of 55% no wall motion abnormalities. Carotid Dopplers with no ICA stenosis. Neurology services consulted with workup ongoing. Aspirin held for post TPA hemorrhage. Modified barium swallow 06/15/2014 maintained on a dysphagia 1 honey thick liquid diet. Physical occupational therapy evaluations completed with recommendations of physical medicine rehabilitation consult.     Review of Systems  Unable to perform ROS: language  Past Medical History   Diagnosis  Date   .  HTN (hypertension)     .  Permanent atrial fibrillation     .  Bradycardia         s/p PPM   .  Hypothyroidism     .  Anxiety     .  Diverticulitis         history of   .  History of hysterectomy     .  Long-term (current) use of anticoagulants     .  Scoliosis (and kyphoscoliosis), idiopathic     .  DVT (deep vein thrombosis) in pregnancy         right peroneal   .  Dizziness and giddiness  07/22/2013    Past Surgical History   Procedure  Laterality   Date   .  Pacemaker insertion    03/03/2006       most recent generator (MDT) change 10/30/06 by Dr Verlon Setting   .  Cardiac catheterization    01/08/1999       normal LV function   .  Cardioversion    10/30/2006       Successful elective DC cardioversion   .  Cardioversion    12/29/2002       Successful DC cardioversion   .  Cardioversion    12/03/1999       Successful DC cardioversion   .  Cholecystectomy        Family History   Problem  Relation  Age of Onset   .  Coronary artery disease  Mother     .  Cancer - Other  Father         Pancreatic Cancer    Social History: reports that she has never smoked. She has never used smokeless tobacco. She reports that she does not drink alcohol or use illicit drugs. Allergies: No Known Allergies Medications Prior to Admission   Medication  Sig  Dispense  Refill   .  acetaminophen (TYLENOL) 325 MG tablet  Take 1 tablet (325 mg total) by mouth every 6 (six) hours as needed for mild pain, fever or headache.         .  ALPRAZolam (XANAX) 0.25 MG tablet  Take 1 tablet (0.25 mg total) by mouth 3 (three) times daily as needed for anxiety. For anxiety   30 tablet   0   .  cholecalciferol (VITAMIN D) 1000 UNITS tablet  Take 1,000 Units by mouth daily.         .  furosemide (LASIX) 20 MG tablet  Take 20 mg by mouth daily as needed for edema.          [name]  levofloxacin (LEVAQUIN) 500 MG tablet  Take 500 mg by mouth daily. For 5 days starting on 06/09/14         .  levothyroxine (SYNTHROID, LEVOTHROID) 50 MCG tablet  Take 1 tablet (50 mcg total) by mouth daily.   90 tablet   3   .  metoprolol succinate (TOPROL-XL) 50 MG 24 hr tablet  Take 50 mg by mouth 2 (two) times daily. Take with or immediately following a meal.         .  Multiple Vitamin (MULTIVITAMIN WITH MINERALS) TABS  Take 1 tablet by mouth daily.         .  traMADol (ULTRAM) 50 MG tablet  Take 0.5 tablets (25 mg total) by mouth every 6 (six) hours as needed for severe pain.   30 tablet   0   .   traZODone (DESYREL) 50 MG tablet  Take 25 mg by mouth at bedtime as needed for sleep.         [name]  warfarin (COUMADIN) 5 MG tablet  Take 5 mg by mouth daily at 6 PM.             Home: Home Living Family/patient expects to be discharged to:: Inpatient rehab Living Arrangements:  (was receiving therapy at [facility]) Available Help at Discharge:  (TBD) Type of Home: [place] (for short-term rehab s/p pelvic fracture) Home Access: Stairs to enter Entrance Stairs-Number of Steps: 3 Entrance Stairs-Rails: None Home Layout: One level Additional Comments: per pt's grandson.  Family would prefer pt go to CIR then home with 24 hour assistance.  They will hire assist if needed   Functional History: Prior Function Level of Independence: Needs assistance Gait / Transfers Assistance Needed: Pt was ambulating at SNF with RW.  Prior to pelvic fracture, pt was independently ambulating ADL's / Homemaking Assistance Needed: Unsure pt's level of assist at SNF, but prior to pelvic fracture, pt was independent with all BADLs and basic meal prep Comments: daughter states that prior to previous fall, patient was independent, has recently been at [facility] for rehab s/p fall and pelvic fc. Functional Status:   Mobility: Bed Mobility Overal bed mobility: Needs Assistance Bed Mobility: Supine to Sit;Sit to Supine Supine to sit: Mod assist Sit to supine: Max assist General bed mobility comments: Pt assisted with moving bil. LEs to EOB, and assisted with lifting trunk  Transfers Overall transfer level: Needs assistance Equipment used: 1 person hand held assist Transfers: Sit to/from Stand Sit to Stand: Mod assist;Max assist Stand pivot transfers: Max assist;Mod assist General transfer comment: max A, progressing to mod A.  Pt initially leaning heavily to Rt., but with facilitation and repetition, pt progressed to less assist and activated Rt trunk and LE   ADL: ADL Overall ADL's :  Needs assistance/impaired Eating/Feeding: Total assistance Grooming: Wash/dry hands;Wash/dry face;Brushing hair;Moderate assistance;Sitting Upper Body Bathing: Maximal assistance;Sitting;Bed level Lower Body Bathing: Maximal assistance;Sit to/from stand Upper Body Dressing : Total assistance;Sitting Lower Body Dressing: Total assistance;Sit to/from stand Toilet Transfer: Maximal assistance;Stand-pivot;BSC  Toileting- Clothing Manipulation and Hygiene: Total assistance;Sit to/from stand Functional mobility during ADLs: Maximal assistance;Moderate assistance (max assist progressing to mod A) General ADL Comments: See comments under Rt. UE function.      Cognition: Cognition Overall Cognitive Status: Difficult to assess Arousal/Alertness: Lethargic Orientation Level: Other (comment) Attention: Focused;Sustained Focused Attention: Appears intact Sustained Attention: Impaired Sustained Attention Impairment: Verbal basic;Functional basic Comments: difficult to assess given elements of expressive/receptive aphasia, however LOA appears to impact all higher levels of function Cognition Arousal/Alertness: Awake/alert Behavior During Therapy: Flat affect Overall Cognitive Status: Difficult to assess Area of Impairment: Orientation;Attention;Memory;Following commands;Safety/judgement;Awareness;Problem solving Orientation Level: Disoriented to;Person;Place;Time;Situation Current Attention Level: Focused Following Commands: Follows one step commands inconsistently Problem Solving: Slow processing;Decreased initiation;Difficulty sequencing;Requires verbal cues;Requires tactile cues Difficult to assess due to: Impaired communication   Blood pressure 155/64, pulse 91, temperature 98.2 F (36.8 C), temperature source Oral, resp. rate 18, height 5' (1.524 m), weight 40.37 kg (89 lb), SpO2 98.00%. Physical Exam  Vitals reviewed. Constitutional:  80 year old frail old white female. Head forward  posture and leaning to the right  Eyes:  Pupils reactive to light  Neck: Normal range of motion. Neck supple. No thyromegaly present.  Cardiovascular:  Cardiac rate controlled  Respiratory: Effort normal and breath sounds normal. No respiratory distress.  GI: Soft. Bowel sounds are normal. She exhibits no distension.  Neurological:  Patient is lethargic but arousable. Left gaze preference. She was expressively and receptively aphasic with very minimal spontaneous speech. She did followed simple commands. Made eye contact when cued. Substantial right sided weakness noted particularly in the RUE but difficult to test given language issues. There was trace movement noticed in the right foot. Senses pain on the righ.   Skin: Skin is warm and dry.     Results for orders placed during the hospital encounter of 06/13/14 (from the past 24 hour(s))   GLUCOSE, CAPILLARY     Status: None     Collection Time      06/15/14  8:48 AM       Result  Value  Ref Range     Glucose-Capillary  84   70 - 99 mg/dL   GLUCOSE, CAPILLARY     Status: None     Collection Time      06/15/14 11:43 AM       Result  Value  Ref Range     Glucose-Capillary  81   70 - 99 mg/dL   GLUCOSE, CAPILLARY     Status: None     Collection Time      06/15/14  4:28 PM       Result  Value  Ref Range     Glucose-Capillary  84   70 - 99 mg/dL   GLUCOSE, CAPILLARY     Status: None     Collection Time      06/15/14  9:40 PM       Result  Value  Ref Range     Glucose-Capillary  87   70 - 99 mg/dL     Comment 1  Notify RN        Comment 2  Documented in Chart       Ct Head Wo Contrast   06/15/2014   CLINICAL DATA:  Followup post TPA hemorrhage  EXAM: CT HEAD WITHOUT CONTRAST  TECHNIQUE: Contiguous axial images were obtained from the base of the skull through the vertex without intravenous contrast.  COMPARISON:  Head CT from yesterday  FINDINGS:  No new osseous or soft tissue findings. The mastoids and middle ears remain aerated.   Unchanged size and shape of an ill-defined hemorrhage within the left basal ganglia, centered in the putamen. Hematoma continues to measure up to 32 by 36 mm. Infarct in the left basal ganglia, left insula, and in the parafalcine  left occipital lobe is better visualized with increase in cytotoxic edema. Rightward midline shift is similar to prior, approximately 5 mm at the level of the foramen of [place]. No new infarct or hemorrhage is identified.  There is re- demonstrated generalized brain atrophy and chronic small vessel disease.  IMPRESSION: 1. Size stable hematoma centered in the left putamen, up to 36 mm. Unchanged midline shift of 5 mm. 2. Increasingly conspicuous infarct in the left basal ganglia, insula, and left occipital lobe. Despite the occipital lobe involvement, the infarct is still from the left carotid circulation - the patient has a fetal left PCA on CTA 05/17/2013.   Electronically Signed   By: Jorje Guild M.D.   On: 06/15/2014 05:12    Ct Head Wo Contrast   06/14/2014   CLINICAL DATA:  Stroke.  TPA.  EXAM: CT HEAD WITHOUT CONTRAST  TECHNIQUE: Contiguous axial images were obtained from the base of the skull through the vertex without intravenous contrast.  COMPARISON:  06/13/2014  FINDINGS: There is a new 3.6 x 3.3 cm acute parenchymal hemorrhage centered in the left lentiform nucleus. There is mild surrounding edema with mild mass effect on the left lateral ventricle and 5 mm of rightward midline shift. Patchy hypodensities in the subcortical and deep cerebral white matter are compatible with mild chronic small vessel ischemic disease with a more focal hypoattenuation in the posterior left parietal lobe possibly reflecting evolving acute infarct. There is no extra-axial fluid collection. Cerebral volume is within normal limits for age. Visualized mastoid air cells and paranasal sinuses are clear.  IMPRESSION: New 3.6 cm left basal ganglia parenchymal hemorrhage, consistent with  hemorrhagic transformation of an acute infarct. 5 mm rightward midline shift.  Critical Value/emergent results were called by telephone at the time of interpretation on 06/14/2014 at 10:58 am to Dr. Leonie Man, who verbally acknowledged these results.   Electronically Signed   By: Logan Bores   On: 06/14/2014 11:01    Dg Swallowing Func-speech Pathology   06/15/2014   Germain Osgood, CCC-SLP     06/15/2014  3:01 PM Objective Swallowing Evaluation: Modified Barium Swallowing Study   Patient Details  Name: Charlott Calvario MRN: [id number] Date of Birth: August 01, 1921  Today's Date: 06/15/2014 Time: 7829-5621 SLP Time Calculation (min): 31 min  Past Medical History:  Past Medical History  Diagnosis Date  . HTN (hypertension)   . Permanent atrial fibrillation   . Bradycardia     s/p PPM  . Hypothyroidism   . Anxiety   . Diverticulitis     history of  . History of hysterectomy   . Long-term (current) use of anticoagulants   . Scoliosis (and kyphoscoliosis), idiopathic   . DVT (deep vein thrombosis) in pregnancy     right peroneal  . Dizziness and giddiness 07/22/2013   Past Surgical History:  Past Surgical History  Procedure Laterality Date  . Pacemaker insertion  03/03/2006    most recent generator (MDT) change 10/30/06 by Dr Verlon Setting  . Cardiac catheterization  01/08/1999    normal LV function  . Cardioversion  10/30/2006    Successful elective DC cardioversion  . Cardioversion  12/29/2002    Successful  DC cardioversion  . Cardioversion  12/03/1999   Successful DC cardioversion  . Cholecystectomy     HPI:  Christy Miranda is a 80 y.o. female with a history of afib on  coumadin who has recently had difficulty with regulating her INR.  She was seen to be normal at 7:15 am when her tray was taken to  her. At 8 - 8:30 when the tech went to get her tray, she noticed  that she was different and EMS was called.      Assessment / Plan / Recommendation Clinical Impression  Dysphagia Diagnosis: Moderate oral phase dysphagia;Moderate   pharyngeal phase dysphagia Clinical impression: Pt presents with a moderate sensorimotor  based oropharyngeal dysphagia. Left-sided weakness results in  anterior loss, decreased lingual manipulation, prolonged A/P  transit, and buccal pocketing. Pt's posturing paired with her  delayed onset in pharyngeal swallow result in silent aspiration  of nectar thick liquids by cup, and silent penetration by spoon.  Given aphasia, pt requires Max multimodal cues for utilization of  any compensatory strategies. Pt demonstrated flash penetration  with pureed solids and honey thick liquids via spoon, with all  penetrates clearing upon completion of the swallow. Recommend to  initiate Dys 1 textures adn honey thick liquids by spoon when pt  is fully alert.     Treatment Recommendation  Therapy as outlined in treatment plan below    Diet Recommendation Dysphagia 1 (Puree);Honey-thick liquid   Liquid Administration via: Spoon Medication Administration: Crushed with puree Supervision: Full supervision/cueing for compensatory  strategies;Patient able to self feed Compensations: Slow rate;Small sips/bites;Check for pocketing;Check for anterior loss Postural Changes and/or Swallow Maneuvers: Seated upright 90  degrees;Upright 30-60 min after meal;Out of bed for meals    Other  Recommendations Oral Care Recommendations: Oral care BID Other Recommendations: Order thickener from pharmacy;Prohibited  food (jello, ice cream, thin soups);Remove water pitcher   Follow Up Recommendations  Inpatient Rehab;24 hour supervision/assistance    Frequency and Duration min 2x/week  2 weeks   Pertinent Vitals/Pain n/a    SLP Swallow Goals     General Date of Onset: 06/13/14 HPI: Stephanne Greeley is a 80 y.o. female with a history of afib on  coumadin who has recently had difficulty with regulating her INR.  She was seen to be normal at 7:15 am when her tray was taken to  her. At 8 - 8:30 when the tech went to get her tray, she noticed  that she was  different and EMS was called.  Type of Study: Modified Barium Swallowing Study Reason for Referral: Objectively evaluate swallowing function Previous Swallow Assessment: BSE 7/14 recommending NPO Diet Prior to this Study: NPO Temperature Spikes Noted: No Respiratory Status: Room air History of Recent Intubation: No Behavior/Cognition: Alert;Cooperative;Pleasant mood;Requires  cueing Oral Cavity - Dentition: Adequate natural dentition Oral Motor / Sensory Function: Impaired - see Bedside swallow  eval Self-Feeding Abilities: Able to feed self;Needs assist Patient Positioning: Other (comment) (pt upright in chair but  with decreased head control ) Baseline Vocal Quality: Low vocal intensity Volitional Cough: Weak (elicited wtih Max multimodal cues) Volitional Swallow: Able to elicit (with Max cues) Anatomy: Other (Comment) (anterior tilt of head, daughter reports  this is increased) Pharyngeal Secretions: Not observed secondary MBS    Reason for Referral Objectively evaluate swallowing function   Oral Phase Oral Preparation/Oral Phase Oral Phase: Impaired Oral - Honey Oral - Honey Teaspoon: Right anterior bolus loss;Weak lingual  manipulation;Lingual pumping;Reduced posterior propulsion;Right  pocketing in lateral  sulci;Delayed oral transit Oral - Nectar Oral - Nectar Teaspoon: Right anterior bolus loss;Weak lingual  manipulation;Lingual pumping;Reduced posterior propulsion;Right  pocketing in lateral sulci;Delayed oral transit Oral - Nectar Cup: Right anterior bolus loss;Weak lingual  manipulation;Lingual pumping;Reduced posterior propulsion;Right  pocketing in lateral sulci;Delayed oral transit Oral - Nectar Straw: Other (Comment) (pt unable to suck liquid up  through straw this afternoon) Oral - Solids Oral - Puree: Right anterior bolus loss;Weak lingual  manipulation;Lingual pumping;Reduced posterior propulsion;Right  pocketing in lateral sulci;Delayed oral transit   Pharyngeal Phase Pharyngeal Phase Pharyngeal  Phase: Impaired Pharyngeal - Honey Pharyngeal - Honey Teaspoon: Delayed swallow initiation;Reduced  anterior laryngeal mobility;Reduced laryngeal  elevation;Penetration/Aspiration during swallow;Reduced  airway/laryngeal closure Penetration/Aspiration details (honey teaspoon): Material enters  airway, remains ABOVE vocal cords then ejected out Pharyngeal - Nectar Pharyngeal - Nectar Teaspoon: Delayed swallow initiation;Reduced  anterior laryngeal mobility;Reduced laryngeal  elevation;Penetration/Aspiration before  swallow;Penetration/Aspiration during swallow;Reduced  airway/laryngeal closure Penetration/Aspiration details (nectar teaspoon): Material enters  airway, remains ABOVE vocal cords and not ejected out Pharyngeal - Nectar Cup: Delayed swallow initiation;Reduced  anterior laryngeal mobility;Reduced laryngeal elevation;Reduced  airway/laryngeal closure;Penetration/Aspiration before swallow Penetration/Aspiration details (nectar cup): Material enters  airway, passes BELOW cords without attempt by patient to eject  out (silent aspiration) Pharyngeal - Solids Pharyngeal - Puree: Delayed swallow initiation;Reduced anterior  laryngeal mobility;Reduced laryngeal elevation;Reduced  airway/laryngeal closure;Penetration/Aspiration during swallow Penetration/Aspiration details (puree): Material enters airway,  remains ABOVE vocal cords then ejected out  Cervical Esophageal Phase    GO    Cervical Esophageal Phase Cervical Esophageal Phase: [facility] [address]        Germain Osgood, M.A. 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 Potentially Co-Morbidities requiring supervision/potential complications: tachy brady syndrome, afib, htn,   Due to  bladder management, bowel management, safety, skin/wound care, disease management, medication administration, pain management and patient education, does the patient require 24 hr/day rehab nursing? Potentially Does the patient require coordinated care of a physician, rehab nurse, PT (1-2 hrs/day, 5 days/week), OT (1-2 hrs/day, 5 days/week) and SLP (1-2 hrs/day, 5 days/week) to address physical and functional deficits in the context of the above medical diagnosis(es)? Potentially Addressing deficits in the following areas: balance, endurance, locomotion, strength, transferring, bowel/bladder control, bathing, dressing, feeding, grooming, toileting, cognition, speech, language, swallowing and psychosocial support Can the patient actively participate in an intensive therapy program of at least 3 hrs of therapy per day at least 5 days per week? No and Potentially The potential for patient to make measurable gains while on inpatient rehab is good and fair Anticipated functional outcomes upon discharge from inpatient rehab are min assist  with PT, min assist and mod assist with OT, min assist and mod assist with SLP. Estimated rehab length of stay to reach the above functional goals is: ?20-28 days Does the patient have adequate social supports to accommodate these discharge functional goals? Potentially Anticipated D/C setting: Home Anticipated post D/C treatments: HH therapy and Home excercise program Overall Rehab/Functional Prognosis: good and fair   RECOMMENDATIONS: This patient's condition is appropriate for continued rehabilitative care in the following setting: see below Patient has agreed to participate in recommended program. N/A Note that insurance prior authorization may be required for reimbursement for recommended care.   Comment: I  am aware that the pt's family doesn't want her to go back to a nursing home, but I have major concerns over whether she can tolerate the intensity of our  program. We will follow along for improved activity tolerance.    Meredith Staggers, MD, [place] [facility]         06/16/2014    Revision History...     Date/Time User Action   06/16/2014 12:36 PM Meredith Staggers, MD Sign   06/16/2014 6:38 AM Cathlyn Parsons, PA-C Pend  View Details Report   Routing History...     Date/Time From To Method   06/16/2014 12:36 PM Meredith Staggers, MD Meredith Staggers, MD In Basket   06/16/2014 12:36 PM Meredith Staggers, MD [name], MD Fax

## 2014-06-19 NOTE — H&P (Signed)
Physical Medicine and Rehabilitation Admission H&P    Chief Complaint  Patient presents with  . Code Stroke  :  Chief complaint: Weakness  HPI: Christy Miranda is a 80 y.o. right-handed female with history of atrial fibrillation status post pacemaker with chronic Coumadin therapy, recent fall with pelvic fractures and had recently been at skilled nursing facility secondary to pelvic fractures.. Admitted 06/13/2049 with right-sided weakness as well as aphasia. INR on admission of 1.35. Initial cranial CT scan negative for acute changes. Patient did receive TPA. Followup cranial CT scan showed no 3.6 cm left basal ganglia parenchymal hemorrhage consistent with hemorrhagic transformation of acute infarct. 5 mm rightward midline shift. Echocardiogram with ejection fraction of 55% no wall motion abnormalities. Carotid Dopplers with no ICA stenosis. Neurology services consulted with workup ongoing. Aspirin held for post TPA hemorrhage. Repeat cranial CT scan 06/19/2014 shows slight interval decrease in size of hematoma centered in the left putamen, measuring 30 x 28 mm. Midline shift of 6 mm not significantly changed. No plan to begin aspirin at this time and repeat scan in one week. Modified barium swallow 06/15/2014 maintained on a dysphagia 1 honey thick liquid diet. Physical occupational therapy evaluations completed with recommendations of physical medicine rehabilitation consult. Patient was admitted for comprehensive rehabilitation program   ROS Review of Systems  Unable to perform ROS: language   Past Medical History  Diagnosis Date  . HTN (hypertension)   . Permanent atrial fibrillation   . Bradycardia     s/p PPM  . Hypothyroidism   . Anxiety   . Diverticulitis     history of  . History of hysterectomy   . Long-term (current) use of anticoagulants   . Scoliosis (and kyphoscoliosis), idiopathic   . DVT (deep vein thrombosis) in pregnancy     right peroneal  . Dizziness and  giddiness 07/22/2013   Past Surgical History  Procedure Laterality Date  . Pacemaker insertion  03/03/2006    most recent generator (MDT) change 10/30/06 by Dr Verlon Setting  . Cardiac catheterization  01/08/1999    normal LV function  . Cardioversion  10/30/2006    Successful elective DC cardioversion  . Cardioversion  12/29/2002    Successful DC cardioversion  . Cardioversion  12/03/1999    Successful DC cardioversion  . Cholecystectomy     Family History  Problem Relation Age of Onset  . Coronary artery disease Mother   . Cancer - Other Father     Pancreatic Cancer   Social History:  reports that she has never smoked. She has never used smokeless tobacco. She reports that she does not drink alcohol or use illicit drugs. Allergies: No Known Allergies Medications Prior to Admission  Medication Sig Dispense Refill  . acetaminophen (TYLENOL) 325 MG tablet Take 1 tablet (325 mg total) by mouth every 6 (six) hours as needed for mild pain, fever or headache.      . ALPRAZolam (XANAX) 0.25 MG tablet Take 1 tablet (0.25 mg total) by mouth 3 (three) times daily as needed for anxiety. For anxiety  30 tablet  0  . cholecalciferol (VITAMIN D) 1000 UNITS tablet Take 1,000 Units by mouth daily.      . furosemide (LASIX) 20 MG tablet Take 20 mg by mouth daily as needed for edema.       [name] levofloxacin (LEVAQUIN) 500 MG tablet Take 500 mg by mouth daily. For 5 days starting on 06/09/14      . levothyroxine (SYNTHROID, LEVOTHROID) 50  MCG tablet Take 1 tablet (50 mcg total) by mouth daily.  90 tablet  3  . metoprolol succinate (TOPROL-XL) 50 MG 24 hr tablet Take 50 mg by mouth 2 (two) times daily. Take with or immediately following a meal.      . Multiple Vitamin (MULTIVITAMIN WITH MINERALS) TABS Take 1 tablet by mouth daily.      . traMADol (ULTRAM) 50 MG tablet Take 0.5 tablets (25 mg total) by mouth every 6 (six) hours as needed for severe pain.  30 tablet  0  . traZODone (DESYREL) 50 MG tablet Take 25  mg by mouth at bedtime as needed for sleep.      [name] warfarin (COUMADIN) 5 MG tablet Take 5 mg by mouth daily at 6 PM.         Home: Home Living Family/patient expects to be discharged to:: Inpatient rehab Living Arrangements:  (was receiving therapy at [facility]) Available Help at Discharge:  (TBD) Type of Home: [place] (for short-term rehab s/p pelvic fracture) Home Access: Stairs to enter Entrance Stairs-Number of Steps: 3 Entrance Stairs-Rails: None Home Layout: One level Additional Comments: per pt's grandson.  Family would prefer pt go to CIR then home with 24 hour assistance.  They will hire assist if needed   Functional History: Prior Function Level of Independence: Needs assistance Gait / Transfers Assistance Needed: Pt was ambulating at SNF with RW.  Prior to pelvic fracture, pt was independently ambulating ADL's / Homemaking Assistance Needed: Unsure pt's level of assist at SNF, but prior to pelvic fracture, pt was independent with all BADLs and basic meal prep Comments: daughter states that prior to previous fall, patient was independent, has recently been at [facility] for rehab s/p fall and pelvic fc.  Functional Status:  Mobility: Bed Mobility Overal bed mobility: Needs Assistance Bed Mobility: Sit to Supine;Rolling Rolling: Max assist Supine to sit: Max assist Sit to supine: Max assist General bed mobility comments: pt with Rt sided hemiparesis; requires max (A) to bring hips to EOB and elevate trunk; pt attempting to (A) with bed mobility using Lt UE Transfers Overall transfer level: Needs assistance Equipment used: None Transfers: Sit to/from [facility] Sit to Stand: Max assist;+2 physical assistance Stand pivot transfers: Max assist;+2 physical assistance General transfer comment: (A) to lift hips from bed, maintain standing, rotate hips from bed>recliner, & controlled descent.  Pt assists wtih use of LUE & LLE.     Ambulation/Gait General Gait Details: will require 2 person (A)     ADL: ADL Overall ADL's : Needs assistance/impaired Eating/Feeding: Total assistance Grooming: Wash/dry hands;Wash/dry face;Moderate assistance;Sitting Upper Body Bathing: Maximal assistance;Sitting;Bed level Lower Body Bathing: Maximal assistance;Sit to/from stand Upper Body Dressing : Sitting;Maximal assistance Lower Body Dressing: Total assistance;Sit to/from stand Toilet Transfer: Maximal assistance;Stand-pivot;+2 for physical assistance Toileting- Clothing Manipulation and Hygiene: Total assistance;Sit to/from stand Functional mobility during ADLs: Maximal assistance General ADL Comments: See comments under Rt. UE function.     Cognition: Cognition Overall Cognitive Status: No family/caregiver present to determine baseline cognitive functioning Arousal/Alertness: Lethargic Orientation Level: Other (comment) (unable to assess) Attention: Focused;Sustained Focused Attention: Appears intact Sustained Attention: Impaired Sustained Attention Impairment: Verbal basic;Functional basic Comments: difficult to assess given elements of expressive/receptive aphasia, however LOA appears to impact all higher levels of function Cognition Arousal/Alertness: Awake/alert Behavior During Therapy: Flat affect Overall Cognitive Status: No family/caregiver present to determine baseline cognitive functioning Area of Impairment: Orientation;Attention;Memory;Following commands;Safety/judgement;Awareness;Problem solving Orientation Level: Disoriented to;Person;Place;Time;Situation Current Attention Level: Focused  Following Commands: Follows one step commands inconsistently Problem Solving: Slow processing;Decreased initiation;Difficulty sequencing;Requires verbal cues;Requires tactile cues Difficult to assess due to: Impaired communication  Physical Exam: Blood pressure 134/56, pulse 89, temperature 99 F (37.2 C), temperature  source Oral, resp. rate 18, height 5' (1.524 m), weight 40.37 kg (89 lb), SpO2 97.00%. Physical Exam Constitutional:  80 year old frail white female. Head forward posture and leaning to the right. Brighter today  Eyes:  Pupils reactive to light  Neck: Normal range of motion. Neck supple. No thyromegaly present.  Cardiovascular:  Cardiac rate controlled. No murmur  Respiratory: Effort normal and breath sounds normal. No respiratory distress.  GI: Soft. Bowel sounds are normal. She exhibits no distension. Non-tender Neurological:  Patient is alert.  Left gaze preference. She was expressively and receptively aphasic with apraxia.  She did follow a few simple commands. Made eye contact when cued. Substantial right sided weakness noted. RUE: 1/5 deltoid, bicep, tricep, 0/5 wrist/hand. RLE: 1+ HF, KE, and 2- ADF/PF but inconsistent. Sensation diminished but senses pinch right arm/leg. No resting tone, reflexes tr.  Skin: Skin is warm and dry Psych: flat, calm, collected  Results for orders placed during the hospital encounter of 06/13/14 (from the past 48 hour(s))  BASIC METABOLIC PANEL     Status: Abnormal   Collection Time    06/19/14  6:19 AM      Result Value Ref Range   Sodium 141  137 - 147 mEq/L   Potassium 3.7  3.7 - 5.3 mEq/L   Chloride 106  96 - 112 mEq/L   CO2 23  19 - 32 mEq/L   Glucose, Bld 92  70 - 99 mg/dL   BUN 13  6 - 23 mg/dL   Creatinine, Ser 0.33 (*) 0.50 - 1.10 mg/dL   Calcium 7.9 (*) 8.4 - 10.5 mg/dL   GFR calc non Af Amer >90  >90 mL/min   GFR calc Af Amer >90  >90 mL/min   Comment: (NOTE)     The eGFR has been calculated using the CKD EPI equation.     This calculation has not been validated in all clinical situations.     eGFR's persistently <90 mL/min signify possible Chronic Kidney     Disease.   Anion gap 12  5 - 15  CBC     Status: Abnormal   Collection Time    06/19/14  6:19 AM      Result Value Ref Range   WBC 3.8 (*) 4.0 - 10.5 K/uL   RBC 3.46 (*)  3.87 - 5.11 MIL/uL   Hemoglobin 10.8 (*) 12.0 - 15.0 g/dL   HCT 33.1 (*) 36.0 - 46.0 %   MCV 95.7  78.0 - 100.0 fL   MCH 31.2  26.0 - 34.0 pg   MCHC 32.6  30.0 - 36.0 g/dL   RDW 16.5 (*) 11.5 - 15.5 %   Platelets 114 (*) 150 - 400 K/uL   Comment: PLATELET COUNT CONFIRMED BY SMEAR   Ct Head Wo Contrast  06/19/2014   CLINICAL DATA:  Followup hemorrhagic stroke.  EXAM: CT HEAD WITHOUT CONTRAST  TECHNIQUE: Contiguous axial images were obtained from the base of the skull through the vertex without intravenous contrast.  COMPARISON:  Prior CT from 06/14/2014  FINDINGS: Study is somewhat limiting due to patient positioning.  Ill defined intraparenchymal hemorrhage centered at the left putamen is slightly decreased in size measuring approximately 30 x 28 mm. Adjacent hemorrhage within the left external  capsule/insula is similar to prior. Associated vasogenic edema is overall not significantly changed. Mass effect on the adjacent left lateral ventricle with associated 6 mm of left-to-right midline shift at the level of the foramen of [place] is similar to prior. No new infarct or hemorrhage.  Cytotoxic edema within the left occipital lobe is slightly more well-defined as compared to prior exam.  Generalized atrophy with chronic small vessel ischemic disease again noted, unchanged.  No new osseous abnormality. Paranasal sinuses and mastoid air cells remain clear.  IMPRESSION: 1. Slight interval decrease in size of hematoma centered in the left putamen, measuring 30 x 28 mm. Midline shift of 6 mm not significantly changed. 2. Continued interval evolution of infarcts involving the left basal ganglia, insula, and left occipital lobe. 3. No new intracranial hemorrhage or infarct.   Electronically Signed   By: Jeannine Boga M.D.   On: 06/19/2014 00:51       Medical Problem List and Plan: 1. 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 No aspirin at this time  repeat CT scan in one week to decide upon initiation of aspirin 2.  DVT Prophylaxis/Anticoagulation: SCDs. Monitor for any signs of DVT 3. Pain Management: Tylenol as needed 4. Mood/anxiety. Xanax 0.25 mg 3 times a day as needed. Provide emotional support:  5. Neuropsych: This patient is not capable of making decisions on her own behalf. 6. Dysphagia. Dysphagia 1 with honey thick liquids. Continue IV fluids for hydration. Followup chemistries 7. History of atrial fibrillation/pacemaker with chronic Coumadin. Coumadin held secondary to hemorrhagic transformation. Cardiac rate controlled. Continue Toprol 50 mg twice a day for rate control 8. Recent pelvic fracture. Weightbearing as tolerated. 9. Hypothyroidism. Synthroid  Post Admission Physician Evaluation: 1. 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 2. Patient is admitted to receive collaborative, interdisciplinary care between the physiatrist, rehab nursing staff, and therapy team. 3. Patient's level of medical complexity and substantial therapy needs in context of that medical necessity cannot be provided at a lesser intensity of care such as a SNF. 4. Patient has experienced substantial functional loss from his/her baseline which was documented above under the "Functional History" and "Functional Status" headings.  Judging by the patient's diagnosis, physical exam, and functional history, the patient has potential for functional progress which will result in measurable gains while on inpatient rehab.  These gains will be of substantial and practical use upon discharge  in facilitating mobility and self-care at the household level. 5. Physiatrist will provide 24 hour management of medical needs as well as oversight of the therapy plan/treatment and provide guidance as appropriate regarding the interaction of the two. 6. 24 hour rehab nursing will assist with bladder management, bowel management,  safety, skin/wound care, disease management, medication administration, pain management and patient education  and help integrate therapy concepts, techniques,education, etc. 7. PT will assess and treat for/with: Lower extremity strength, range of motion, stamina, balance, functional mobility, safety, adaptive techniques and equipment, NMR, caregiver education, exercise tolerance, cognitive linguistic rx.   Goals are: mod assist. 8. OT will assess and treat for/with: ADL's, functional mobility, safety, upper extremity strength, adaptive techniques and equipment, NMR, cognitive perceptual awareness, stamina, visual spatial awareness.   Goals are: mod assist. 9. SLP will assess and treat for/with: speech, swallow, language, cognition.  Goals are: mod to max assist. 10. Case Management and Social Worker will assess and treat for psychological issues and discharge planning. 11. Team conference will be  held weekly to assess progress toward goals and to determine barriers to discharge. 12. Patient will receive at least 3 hours of therapy per day at least 5 days per week. 13. ELOS: 10-20+ days depending upon neurological progress and activity tolerance       14. Prognosis:  good     Meredith Staggers, MD, [facility]   06/19/2014

## 2014-06-19 NOTE — Progress Notes (Signed)
Pt discharge education and instructions completed with pt and sister at bedside. Pt provided handout education on stroke. Pt IV remains intact. Report called off to CIR at 1630. Pt transported off unit via bed with belongings and sister at bedside. Francis Gaines Will Schier RN.

## 2014-06-19 NOTE — Progress Notes (Signed)
OT Cancellation Note  Patient Details Name: Christy Miranda MRN: [id number] DOB: November 25, 1921   Cancelled Treatment:    Reason Eval/Treat Not Completed: Other (comment) (Pt going to CIR today-spoke with rehab coordinator.)  Benito Mccreedy OTR/L [phone] 06/19/2014, 1:42 PM

## 2014-06-19 NOTE — Interval H&P Note (Signed)
Christy Miranda was admitted today to Inpatient Rehabilitation with the diagnosis of left CVA.  The patient's history has been reviewed, patient examined, and there is no change in status.  Patient continues to be appropriate for intensive inpatient rehabilitation.  I have reviewed the patient's chart and labs.  Questions were answered to the patient's satisfaction.  [name] T 06/19/2014, 7:07 PM

## 2014-06-19 NOTE — Progress Notes (Signed)
Stroke Team Progress Note  HISTORY Christy Miranda is a 80 y.o. female with a history of afib on coumadin who has recently had difficulty with regulating her INR. She was seen to be normal at 7:15 am on 06/13/2014 when her tray was taken to her. At 8 - 8:30 when the tech went to get her tray, she noticed that she was different and EMS was called. On arrival to the hospital, she was found to be weak on the right with a left gaze preference and aphasia. tpa was given : after discussion with daughter and son the risks and benefits and fact that there is little data on patients in her age range with the 3 - 4 .5 hour window, but given the benefit seen in the population studied and severe nature of her deficits, I did offer tPA and family agreed she would want it despite risk. Following, She was admitted to the neuro ICU for further evaluation and treatment. LKW: 7:15 am 06/13/2014 TPA given: Yes   SUBJECTIVE No acute events overnight. The patient is awake alert with mixed expressive and receptive aphasia. CT repeat done continue to show left BG hemorrhagic conversion with left MCA and PCA infarcts.   OBJECTIVE Most recent Vital Signs: Filed Vitals:   06/18/14 1806 06/18/14 2204 06/19/14 0200 06/19/14 0600  BP: 153/60 161/72 158/69 135/59  Pulse: 86 83 92 83  Temp: 99 F (37.2 C) 99.3 F (37.4 C) 99 F (37.2 C) 99.1 F (37.3 C)  TempSrc: Axillary Oral Oral Oral  Resp: 16 16 18 18   Height:      Weight:      SpO2: 99% 99% 95% 94%   CBG (last 3)  No results found for this basename: GLUCAP,  in the last 72 hours  IV Fluid Intake:   . sodium chloride 75 mL/hr at 06/17/14 1951  . sodium chloride 1,000 mL (06/19/14 0704)    MEDICATIONS  . antiseptic oral rinse  15 mL Mouth Rinse q12n4p  . chlorhexidine  15 mL Mouth Rinse BID  . levothyroxine  50 mcg Oral QAC breakfast  . metoprolol succinate  50 mg Oral BID  . pantoprazole  40 mg Oral QHS   PRN:  acetaminophen, acetaminophen, ALPRAZolam,  furosemide, labetalol, senna-docusate  Diet:  Dysphagia  1 with honey thick liquids Activity:  Up with assistance DVT Prophylaxis: SCDs   CLINICALLY SIGNIFICANT STUDIES Basic Metabolic Panel:   Recent Labs Lab 06/13/14 0943 06/13/14 0947 06/13/14 1400 06/19/14 0619  NA 135* 135*  --  141  K 5.7* 4.2 4.0 3.7  CL 98 99  --  106  CO2 27  --   --  23  GLUCOSE 98 95  --  92  BUN 14 15  --  13  CREATININE 0.42* 0.50  --  0.33*  CALCIUM 8.5  --   --  7.9*   Liver Function Tests:   Recent Labs Lab 06/13/14 0943  AST 43*  ALT 22  ALKPHOS 117  BILITOT 0.5  PROT 7.0  ALBUMIN 3.2*   CBC:   Recent Labs Lab 06/13/14 0943 06/13/14 0947 06/19/14 0619  WBC 3.1*  --  3.8*  NEUTROABS 2.1  --   --   HGB 11.7* 12.6 10.8*  HCT 36.1 37.0 33.1*  MCV 98.1  --  95.7  PLT 150  --  114*   Coagulation:   Recent Labs Lab 06/13/14 0943  LABPROT 16.7*  INR 1.35   Cardiac Enzymes: No results found  for this basename: CKTOTAL, CKMB, CKMBINDEX, TROPONINI,  in the last 168 hours Urinalysis:   Recent Labs Lab 06/13/14 1000  COLORURINE YELLOW  LABSPEC 1.016  PHURINE 7.0  GLUCOSEU NEGATIVE  HGBUR NEGATIVE  BILIRUBINUR NEGATIVE  KETONESUR NEGATIVE  PROTEINUR NEGATIVE  UROBILINOGEN 1.0  NITRITE NEGATIVE  LEUKOCYTESUR NEGATIVE   Lipid Panel    Component Value Date/Time   CHOL 132 06/14/2014 0229   TRIG 37 06/14/2014 0229   HDL 57 06/14/2014 0229   CHOLHDL 2.3 06/14/2014 0229   VLDL 7 06/14/2014 0229   LDLCALC 68 06/14/2014 0229   HgbA1C  Lab Results  Component Value Date   HGBA1C 6.1* 06/14/2014    Urine Drug Screen:     Component Value Date/Time   LABOPIA NONE DETECTED 06/13/2014 1000   COCAINSCRNUR NONE DETECTED 06/13/2014 1000   LABBENZ NONE DETECTED 06/13/2014 1000   AMPHETMU NONE DETECTED 06/13/2014 1000   THCU NONE DETECTED 06/13/2014 1000   LABBARB NONE DETECTED 06/13/2014 1000    Alcohol Level:   Recent Labs Lab 06/13/14 0943  ETH <11    Ct Head Wo  Contrast  06/19/14 IMPRESSION:  1. Slight interval decrease in size of hematoma centered in the left  putamen, measuring 30 x 28 mm. Midline shift of 6 mm not  significantly changed.  2. Continued interval evolution of infarcts involving the left basal  ganglia, insula, and left occipital lobe.  3. No new intracranial hemorrhage or infarct.  06/15/2014      1. Size stable hematoma centered in the left putamen, up to 36 mm. Unchanged midline shift of 5 mm.  2. Increasingly conspicuous infarct in the left basal ganglia, insula, and left occipital lobe. Despite the occipital lobe involvement, the infarct is still from the left carotid circulation - the patient has a fetal left PCA on CTA 05/17/2013 .    06/14/2014      IMPRESSION: New 3.6 cm left basal ganglia parenchymal hemorrhage, consistent with hemorrhagic transformation of an acute infarct. 5 mm rightward midline shift.    06/13/2014      IMPRESSION: No acute intracranial pathology.    MRI/MRIA of the brain  Unable to do due to pacemaker  Carotid Doppler  06/15/14 No evidence of hemodynamically significant internal carotid artery stenosis. Vertebral artery flow is antegrade.   2D Echocardiogram  06/13/14 Left ventricle: The cavity size was normal. Wall thickness was increased in a pattern of mild LVH. There was focal basal hypertrophy. Systolic function was normal. The estimated ejection fraction was in the range of 50% to 55%. Wall motion was normal; there were no regional wall motion abnormalities  Dg Chest Port 1 View 06/13/2014    IMPRESSION: Allowing for differences in positioning there has not been significant interval change since the previous study. Atelectasis or scarring at the lung bases is present.     EKG  06/13/14 Atrial fibrillation. Right axis deviation. Borderline repolarization abnormality  Transcranial Dopplers  06/19/14 absence of windows  Therapy Recommendations CIR  Physical Exam Blood pressure 135/59, pulse 83,  temperature 99.1 F (37.3 C), temperature source Oral, resp. rate 18, height 5' (1.524 m), weight 40.37 kg (89 lb), SpO2 94.00%. Gen: Patient is frail elderly woman in no acute distress.  Cardiac: Irregularly irregular S1S2 audible. No M/R/G.  Extremities: Cap refill <2 secs. No cyanosis or edema. Pulses 2+ radial and DP. Pulmonary: Respirations regular, symmetric. Lungs clear to auscultation bilat. Abd: Soft, non-tender. BS audible x 4 quadrants.  G/U: Deferred  MS:  Alert and responsive, aphasic.  Follows only command to stick out tongue.  Speech: Speech mildy dysarthric. Mixed receptive and expressive aphasia. Not able to name or repeat. CN: R field cut. Left gaze preference. PERRL. R facial droop. Hearing grossly intact.  Sternocleidomastoids and trapezius 5/5 strength. Tongue midline, no atrophy or fasciculations.  Strength: 5/5  In left extremities proximally and distally. 4/5 RUE and 4/5 RLE weakness Sensation: not cooperative for the test.  Coordination: cannot test due to aphasia  Reflexes: 1+ biceps, brachioradialis bilat. 1+ patellar, achilles bilat. No clonus. Downgoing toes on left and upgoing on right side.    ASSESSMENT/PLAN [name] is a 80 y.o. female with prior hx of HTN, Afib on long term anticoagulation with sub therapeutic INR, presenting with altered mental status, aphasia and right hemiparesis. Status post IV t-PA 1034 on 06/13/2014 . Imaging shows left MCA branch infarct with asymptomatic post TPA hemorrhagic transformation. Infarct felt to be embolic secondary to  Atrial fibrillation with suboptimal anticoagulation.  On warfarin prior to admission, INR subtherapeutic on arrival at 1.35. Now on no anticoagulation secondary to hemorrhagic transfromation for secondary stroke prevention. Patient with resultant global aphasia, dysphagia and right hemiparesis. Stroke work up completed.  Stroke: Acute infarct affecting L basal ganglia, insula, and occipital lobe with  asymptomatic post tPA hemorrhagic conversion, etiology embolic secondary to atrial fibrillation on subtherapeutic anticoagulation  Patient unable to have MRI due to pacemaker  Repeat CT this am still shows blood  Continue to hold ASA, repeat CT in another week, if no blood, may start ASA  Carotid US shows <39% stenosis bilat  2D Echo shows mild LVH, normal EF, no definite cardiac source of emboli identified  TCDs no window  LDL 68, hold off statin now due to hemorrhagic transformation  A1c 6.1  Patient accepted for CIR  Pt needs repeat CT head in about one week 06/26/14. If hematoma resolved, pt may start on ASA full dose daily.   Follow up in clinic in 3-4 weeks regarding resuming coumadin.  Hypertension, Atrial Fibrillation, Tachy/Brady Syndrome, Edema  Not able to start anticoagulation due to hemorrhagic transformation.  Patient previously on furosemide 20 mg daily, metoprolol succinate 50 mg BID, and warfarin 5 mg daily at home  Currently on metoprolol succinate 50 mg BID in hospital  BP 134-172/56-72 and HR 83-92 in last 24 hours. Currently SBP goal <160, but in the long run her SBP goal 120-140. Gradually bring SBP done to normal within 5-7 days.   Holding coumadin due to hemorrhage, will see patient for follow up in Stroke Clinic in 3-4 weeks and will decide whether to resume anticoagulation at that time  Undernutrition  Cachectic/frail Body mass index is 17.38 kg/(m^2).   Now on dysphagia 1 diet with honey thick liquids   Encourage oral intake  Family agrees to DO NOT RESUSCITATE   Dispo: to [facility] day # 6   SIGNED Delbert Phenix, MSN, ANP-C, CNRN, MSCS [facility] Stroke Team [phone] 06/19/2014, 9:17 AM  I, the attending vascular neurologist, have personally obtained a history, examined the patient, evaluated laboratory data and independently viewed imaging studies, and formulated the assessment and plan of care.  I have made any  additions or clarifications directly to the above note and agree with the findings and plan as currently documented.   Rosalin Hawking, MD PhD 06/19/2014 4:23 PM   To contact Stroke Continuity provider, please refer to [email or web address] After hours, contact General Neurology

## 2014-06-19 NOTE — Progress Notes (Signed)
UR complete.  Danicka Hourihan RN, MSN 

## 2014-06-20 ENCOUNTER — Inpatient Hospital Stay (HOSPITAL_COMMUNITY): Payer: Medicare Other | Admitting: Speech Pathology

## 2014-06-20 ENCOUNTER — Inpatient Hospital Stay (HOSPITAL_COMMUNITY): Payer: Medicare Other | Admitting: Occupational Therapy

## 2014-06-20 ENCOUNTER — Inpatient Hospital Stay (HOSPITAL_COMMUNITY): Payer: Medicare Other | Admitting: Rehabilitation

## 2014-06-20 DIAGNOSIS — I10 Essential (primary) hypertension: Secondary | ICD-10-CM

## 2014-06-20 DIAGNOSIS — I4891 Unspecified atrial fibrillation: Secondary | ICD-10-CM

## 2014-06-20 DIAGNOSIS — Z5189 Encounter for other specified aftercare: Secondary | ICD-10-CM

## 2014-06-20 DIAGNOSIS — I6330 Cerebral infarction due to thrombosis of unspecified cerebral artery: Secondary | ICD-10-CM

## 2014-06-20 LAB — COMPREHENSIVE METABOLIC PANEL
ALBUMIN: 2.6 g/dL — AB (ref 3.5–5.2)
ALT: 10 U/L (ref 0–35)
ANION GAP: 11 (ref 5–15)
AST: 17 U/L (ref 0–37)
Alkaline Phosphatase: 79 U/L (ref 39–117)
BUN: 12 mg/dL (ref 6–23)
CO2: 25 mEq/L (ref 19–32)
CREATININE: 0.37 mg/dL — AB (ref 0.50–1.10)
Calcium: 8.2 mg/dL — ABNORMAL LOW (ref 8.4–10.5)
Chloride: 102 mEq/L (ref 96–112)
GFR calc Af Amer: >90 mL/min (ref >90)
GFR calc non Af Amer: >90 mL/min (ref >90)
Glucose, Bld: 92 mg/dL (ref 70–99)
Potassium: 3.6 mEq/L — ABNORMAL LOW (ref 3.7–5.3)
Sodium: 138 mEq/L (ref 137–147)
TOTAL PROTEIN: 5.6 g/dL — AB (ref 6.0–8.3)
Total Bilirubin: 0.6 mg/dL (ref 0.3–1.2)

## 2014-06-20 LAB — CBC WITH DIFFERENTIAL/PLATELET
BASOS PCT: 0 % (ref 0–1)
Basophils Absolute: 0 10*3/uL (ref 0.0–0.1)
EOS ABS: 0.1 10*3/uL (ref 0.0–0.7)
EOS PCT: 1 % (ref 0–5)
HEMATOCRIT: 32.4 % — AB (ref 36.0–46.0)
HEMOGLOBIN: 10.6 g/dL — AB (ref 12.0–15.0)
Lymphocytes Relative: 20 % (ref 12–46)
Lymphs Abs: 0.7 10*3/uL (ref 0.7–4.0)
MCH: 31.4 pg (ref 26.0–34.0)
MCHC: 32.7 g/dL (ref 30.0–36.0)
MCV: 95.9 fL (ref 78.0–100.0)
MONO ABS: 0.3 10*3/uL (ref 0.1–1.0)
MONOS PCT: 8 % (ref 3–12)
NEUTROS PCT: 71 % (ref 43–77)
Neutro Abs: 2.5 10*3/uL (ref 1.7–7.7)
Platelets: 103 10*3/uL — ABNORMAL LOW (ref 150–400)
RBC: 3.38 MIL/uL — ABNORMAL LOW (ref 3.87–5.11)
RDW: 16.3 % — ABNORMAL HIGH (ref 11.5–15.5)
WBC: 3.5 10*3/uL — ABNORMAL LOW (ref 4.0–10.5)

## 2014-06-20 NOTE — Progress Notes (Signed)
Patient's right anterior forearm noted with redness, swelling and warmth at 0640. IV stopped; elevated arm. K-PAD ordered as instructed. Vitals at 0645: 99.0 oral, 178/75, 86, 18, 94% RA. Vitals at 0744: 98.9 oral, 147/75, 80, 20.Marland KitchenMarland KitchenMarland KitchenWill continue to monitor. Abreanna Drawdy A. Nahima Ales, RN.

## 2014-06-20 NOTE — Progress Notes (Signed)
80 y.o. right-handed female with history of atrial fibrillation status post pacemaker with chronic Coumadin therapy, recent fall with pelvic fractures and had recently been at skilled nursing facility secondary to pelvic fractures.. Admitted 06/13/2049 with right-sided weakness as well as aphasia. INR on admission of 1.35. Initial cranial CT scan negative for acute changes. Patient did receive TPA. Followup cranial CT scan showed no 3.6 cm left basal ganglia parenchymal hemorrhage consistent with hemorrhagic transformation of acute infarct. 5 mm rightward midline shift. Echocardiogram with ejection fraction of 55% no wall motion abnormalities. Carotid Dopplers with no ICA stenosis. Neurology services consulted with workup ongoing. Aspirin held for post TPA hemorrhage. Repeat cranial CT scan 06/19/2014 shows slight interval decrease in size of hematoma centered in the left putamen, measuring 30 x 28 mm  Subjective/Complaints:   Objective: Vital Signs: Blood pressure 166/76, pulse 93, temperature 99.2 F (37.3 C), temperature source Oral, resp. rate 17, height 5' (1.524 m), weight 45.2 kg (99 lb 10.4 oz), SpO2 96.00%. Ct Head Wo Contrast  06/19/2014   CLINICAL DATA:  Followup hemorrhagic stroke.  EXAM: CT HEAD WITHOUT CONTRAST  TECHNIQUE: Contiguous axial images were obtained from the base of the skull through the vertex without intravenous contrast.  COMPARISON:  Prior CT from 06/14/2014  FINDINGS: Study is somewhat limiting due to patient positioning.  Ill defined intraparenchymal hemorrhage centered at the left putamen is slightly decreased in size measuring approximately 30 x 28 mm. Adjacent hemorrhage within the left external capsule/insula is similar to prior. Associated vasogenic edema is overall not significantly changed. Mass effect on the adjacent left lateral ventricle with associated 6 mm of left-to-right midline shift at the level of the foramen of [place] is similar to prior. No new infarct or  hemorrhage.  Cytotoxic edema within the left occipital lobe is slightly more well-defined as compared to prior exam.  Generalized atrophy with chronic small vessel ischemic disease again noted, unchanged.  No new osseous abnormality. Paranasal sinuses and mastoid air cells remain clear.  IMPRESSION: 1. Slight interval decrease in size of hematoma centered in the left putamen, measuring 30 x 28 mm. Midline shift of 6 mm not significantly changed. 2. Continued interval evolution of infarcts involving the left basal ganglia, insula, and left occipital lobe. 3. No new intracranial hemorrhage or infarct.   Electronically Signed   By: Jeannine Boga M.D.   On: 06/19/2014 00:51   Results for orders placed during the hospital encounter of 06/19/14 (from the past 72 hour(s))  CBC WITH DIFFERENTIAL     Status: Abnormal   Collection Time    06/20/14  4:30 AM      Result Value Ref Range   WBC 3.5 (*) 4.0 - 10.5 K/uL   RBC 3.38 (*) 3.87 - 5.11 MIL/uL   Hemoglobin 10.6 (*) 12.0 - 15.0 g/dL   HCT 32.4 (*) 36.0 - 46.0 %   MCV 95.9  78.0 - 100.0 fL   MCH 31.4  26.0 - 34.0 pg   MCHC 32.7  30.0 - 36.0 g/dL   RDW 16.3 (*) 11.5 - 15.5 %   Platelets 103 (*) 150 - 400 K/uL   Comment: CONSISTENT WITH PREVIOUS RESULT   Neutrophils Relative % 71  43 - 77 %   Neutro Abs 2.5  1.7 - 7.7 K/uL   Lymphocytes Relative 20  12 - 46 %   Lymphs Abs 0.7  0.7 - 4.0 K/uL   Monocytes Relative 8  3 - 12 %   Monocytes Absolute  0.3  0.1 - 1.0 K/uL   Eosinophils Relative 1  0 - 5 %   Eosinophils Absolute 0.1  0.0 - 0.7 K/uL   Basophils Relative 0  0 - 1 %   Basophils Absolute 0.0  0.0 - 0.1 K/uL  COMPREHENSIVE METABOLIC PANEL     Status: Abnormal   Collection Time    06/20/14  4:30 AM      Result Value Ref Range   Sodium 138  137 - 147 mEq/L   Potassium 3.6 (*) 3.7 - 5.3 mEq/L   Chloride 102  96 - 112 mEq/L   CO2 25  19 - 32 mEq/L   Glucose, Bld 92  70 - 99 mg/dL   BUN 12  6 - 23 mg/dL   Creatinine, Ser 0.37 (*) 0.50  - 1.10 mg/dL   Calcium 8.2 (*) 8.4 - 10.5 mg/dL   Total Protein 5.6 (*) 6.0 - 8.3 g/dL   Albumin 2.6 (*) 3.5 - 5.2 g/dL   AST 17  0 - 37 U/L   ALT 10  0 - 35 U/L   Alkaline Phosphatase 79  39 - 117 U/L   Total Bilirubin 0.6  0.3 - 1.2 mg/dL   GFR calc non Af Amer >90  >90 mL/min   GFR calc Af Amer >90  >90 mL/min   Comment: (NOTE)     The eGFR has been calculated using the CKD EPI equation.     This calculation has not been validated in all clinical situations.     eGFR's persistently <90 mL/min signify possible Chronic Kidney     Disease.   Anion gap 11  5 - 15      Constitutional:  80 year old frail white female. Head forward posture and leaning to the right. Eyes:  Pupils reactive to light  Neck: Normal range of motion. Neck supple. No thyromegaly present.  Cardiovascular:  Irreg Irreg. No murmur  Respiratory: Effort normal and breath sounds normal. No respiratory distress.  GI: Soft. Bowel sounds are normal. She exhibits no distension. Non-tender Neurological:  Patient is alert. Left gaze preference. She was expressively and receptively aphasic with apraxia. She did follow a few simple commands. Made eye contact when cued.RUE: 0/5 deltoid, bicep, tricep, 1/5 finger flex. RLE: 3- HF, KE, and 2- ADF/PF but inconsistent. Sensation diminished but senses pinch right arm/leg. Increased tone R biceps MAS1 Skin: Skin is warm and dry  Psych: flat, calm, collected   Assessment/Plan: 1. Functional deficits secondary to  left basal ganglia parenchymal hemorrhage consistent with hemorrhagic transformation of acute infarct which require 3+ hours per day of interdisciplinary therapy in a comprehensive inpatient rehab setting. Physiatrist is providing close team supervision and 24 hour management of active medical problems listed below. Physiatrist and rehab team continue to assess barriers to discharge/monitor patient progress toward functional and medical goals. FIM:                    Comprehension Comprehension Mode: Auditory Comprehension: 2-Understands basic 25 - 49% of the time/requires cueing 51 - 75% of the time  Expression Expression Mode: Verbal  Social Interaction Social Interaction: 1-Interacts appropriately less than 25% of the time. May be withdrawn or combative.  Problem Solving Problem Solving: 1-Solves basic less than 25% of the time - needs direction nearly all the time or does not effectively solve problems and may need a restraint for safety     Medical Problem List and Plan:  1. Functional deficits  secondary to left basal ganglia infarct with hemorrhagic transformation. No aspirin at this time repeat CT scan in one week to decide upon initiation of aspirin  2. DVT Prophylaxis/Anticoagulation: SCDs. Monitor for any signs of DVT  3. Pain Management: Tylenol as needed  4. Mood/anxiety. Xanax 0.25 mg 3 times a day as needed. Provide emotional support:  5. Neuropsych: This patient is not capable of making decisions on her own behalf.   6. Dysphagia. Dysphagia 1 with honey thick liquids. Continue IV fluids for hydration. Followup chemistries  7. History of atrial fibrillation/pacemaker with chronic Coumadin. Coumadin held secondary to hemorrhagic transformation x 3-4 wk.  To f/u with Neuro post D/C to determine restart date on warfarin Cardiac rate controlled. Continue Toprol 50 mg twice a day for rate control  8. Recent pelvic fracture. Weightbearing as tolerated. No pain with LE ROM 9. Hypothyroidism. Synthroid   LOS (Days) 1 A FACE TO FACE EVALUATION WAS PERFORMED  KIRSTEINS,ANDREW E 06/20/2014, 6:36 AM

## 2014-06-20 NOTE — Progress Notes (Signed)
Occupational Therapy Session Note  Patient Details  Name: Christy Miranda MRN: [id number] Date of Birth: 1921/05/29  Today's Date: 06/20/2014 Time: 2800-3491 Time Calculation (min): 32 min  Skilled Therapeutic Interventions/Progress Updates:   Pt worked on transfer from wheelchair to EOB with total assist.  Max assist needed for forward weightshifts during reciprical scooting.  Pt unable to maintain static sitting EOC prior to transfers.  Total assist for stand pivot transfer to the bed with pt maintaining severe kyphotic posture.  Total assist to transition to supine.  Pt worked on rolling side to side for positioning of the bed pad.  She needed mod assist to roll to the right and mod assist to the left using the bedrails and max demonstrational cueing.  Pt's sister present for session and therapist discussed the pt's need for 24 hour assistance if she is discharging home.  Also educated her to position her chair on the right side of the pt to encourage and scanning to the right side.  Pt's RUE positioned on pillow for safety as well.   Therapy Documentation Precautions:  Precautions Precautions: Fall Precaution Comments: Rt sided hemiparesis, aphasia, severe kyphosis/scoliosis, recent pelvic fx, right neglect Restrictions Weight Bearing Restrictions: No LLE Weight Bearing: Weight bearing as tolerated  Vital Signs: Therapy Vitals Temp: 98.6 F (37 C) Temp src: Oral Pulse Rate: 44 Resp: 17 BP: 165/73 mmHg Patient Position (if appropriate): Lying Oxygen Therapy SpO2: 92 % Pain: See FIM for current functional status  Therapy/Group: Individual Therapy  Kenta Laster OTR/L 06/20/2014, 4:46 PM

## 2014-06-20 NOTE — Plan of Care (Signed)
Problem: RH SKIN INTEGRITY Goal: RH STG SKIN FREE OF INFECTION/BREAKDOWN Outcome: Progressing Remain free of infection/breakdown

## 2014-06-20 NOTE — Progress Notes (Signed)
Social Work Patient ID: Christy Miranda, female   DOB: [birth year], 80 y.o.   MRN: [id number]  CSW attempted to complete assessment, however no family was present during [place] visit and pt has aphasia, so CSW will reach out to pt's dtr via telephone to complete.  CSW was able to meet pt and introduce self and role.  CSW told pt that CSW would leave business card in the room and touch base with her dtr.  Pt was agreeable and did not have any needs while CSW was present.  Pt's call button was in reach on her lap.

## 2014-06-20 NOTE — Evaluation (Signed)
Occupational Therapy Assessment and Plan  Patient Details  Name: Christy Miranda MRN: [id number] Date of Birth: [birth year]  OT Diagnosis: abnormal posture, altered mental status, apraxia, disturbance of vision, hemiplegia affecting dominant side, muscular wasting and disuse atrophy, muscle weakness (generalized) and swelling of limb Rehab Potential: Rehab Potential: Fair ELOS: 12-14 days    Today's Date: 06/20/2014 Time: 1100-1200 Time Calculation (min): 60 min  Problem List:  Patient Active Problem List   Diagnosis Date Noted  . CVA (cerebral infarction) 06/19/2014  . Undernutrition 06/19/2014  . Fracture of multiple pubic rami 05/22/2014  . Encounter for therapeutic drug monitoring 01/02/2014  . Dizziness and giddiness 07/22/2013  . Thrombocytopenia 11/16/2012  . Deep venous thrombosis of lower leg 11/13/2012  . Subtherapeutic international normalized ratio (INR) 11/13/2012  . Pancytopenia 11/13/2012  . Edema 11/07/2012  . Pacemaker-Medtronic 08/19/2012  . Current use of long term anticoagulation 08/08/2011  . Atrial fibrillation 02/28/2011  . Tachycardia-bradycardia syndrome 02/28/2011  . Hypertension 02/28/2011    Past Medical History:  Past Medical History  Diagnosis Date  . HTN (hypertension)   . Permanent atrial fibrillation   . Bradycardia     s/p PPM  . Hypothyroidism   . Anxiety   . Diverticulitis     history of  . History of hysterectomy   . Long-term (current) use of anticoagulants   . Scoliosis (and kyphoscoliosis), idiopathic   . DVT (deep vein thrombosis) in pregnancy     right peroneal  . Dizziness and giddiness 07/22/2013   Past Surgical History:  Past Surgical History  Procedure Laterality Date  . Pacemaker insertion  03/03/2006    most recent generator (MDT) change 10/30/06 by Dr Verlon Setting  . Cardiac catheterization  01/08/1999    normal LV function  . Cardioversion  10/30/2006    Successful elective DC cardioversion  . Cardioversion  12/29/2002     Successful DC cardioversion  . Cardioversion  12/03/1999    Successful DC cardioversion  . Cholecystectomy      Assessment & Plan Clinical Impression: Patient is a 80 y.o. year old female with recent admission to the hospital on 06/13/2049 with right-sided weakness as well as aphasia. INR on admission of 1.35. Initial cranial CT scan negative for acute changes. Patient did receive TPA. Followup cranial CT scan showed no 3.6 cm left basal ganglia parenchymal hemorrhage consistent with hemorrhagic transformation of acute infarct. 5 mm rightward midline shift.  Patient transferred to CIR on 06/19/2014 .    Patient currently requires total with basic self-care skills secondary to impaired timing and sequencing, abnormal tone, unbalanced muscle activation, motor apraxia, decreased coordination and decreased motor planning, decreased visual perceptual skills and field cut, decreased midline orientation, decreased attention to right, right side neglect and decreased motor planning, decreased attention, decreased problem solving, decreased safety awareness, decreased memory and delayed processing and decreased sitting balance, decreased standing balance, decreased postural control, hemiplegia and decreased balance strategies.  Prior to hospitalization, patient could complete ADls with supervision.  Patient will benefit from skilled intervention to decrease level of assist with basic self-care skills and increase independence with basic self-care skills prior to discharge home with care partner.  Anticipate patient will require moderate physical assestance and follow up home health.  OT - End of Session Activity Tolerance: Tolerates 10 - 20 min activity with multiple rests Endurance Deficit: Yes OT Assessment Rehab Potential: Fair Barriers to Discharge: Decreased caregiver support Barriers to Discharge Comments: Pt will need 24 hour physical assist at discharge, unsure  if this can be provided. OT  Patient demonstrates impairments in the following area(s): Balance;Cognition;Edema;Endurance;Motor;Perception;Safety;Sensory;Skin Integrity;Vision OT Basic ADL's Functional Problem(s): Eating;Grooming;Bathing;Dressing;Toileting OT Transfers Functional Problem(s): Toilet;Tub/Shower OT Additional Impairment(s): Fuctional Use of Upper Extremity OT Plan OT Intensity: Minimum of 1-2 x/day, 45 to 90 minutes OT Frequency: 5 out of 7 days OT Duration/Estimated Length of Stay: 12-14 days  OT Treatment/Interventions: Balance/vestibular training;DME/adaptive equipment instruction;Community reintegration;Functional electrical stimulation;Discharge planning;Functional mobility training;Psychosocial support;Therapeutic Activities;Visual/perceptual remediation/compensation;UE/LE Coordination activities;Splinting/orthotics;Patient/family education;Pain management;Skin care/wound managment;UE/LE Strength taining/ROM;Therapeutic Exercise;Self Care/advanced ADL retraining;Neuromuscular re-education;Disease mangement/prevention;Wheelchair propulsion/positioning;Cognitive remediation/compensation OT Self Feeding Anticipated Outcome(s): supervision OT Basic Self-Care Anticipated Outcome(s): mod to max assist OT Toileting Anticipated Outcome(s): mod assist OT Bathroom Transfers Anticipated Outcome(s): mod assist OT Recommendation Patient destination: Home Follow Up Recommendations: Home health OT;24 hour supervision/assistance Equipment Recommended: 3 in 1 bedside comode;Tub/shower bench;To be determined   OT Evaluation Precautions/Restrictions  Precautions Precautions: Fall Precaution Comments: Rt sided hemiparesis, aphasia, severe kyphosis/scoliosis, recent pelvic fx, right neglect Restrictions Weight Bearing Restrictions: No LLE Weight Bearing: Weight bearing as tolerated  Vital Signs Therapy Vitals Temp: 98.6 F (37 C) Temp src: Oral Pulse Rate: 44 Resp: 17 BP: 165/73 mmHg Patient Position (if  appropriate): Lying Oxygen Therapy SpO2: 92 % Pain Pain Assessment Pain Assessment: Faces Faces Pain Scale: No hurt Home Living/Prior Functioning Home Living Additional Comments: Pt's sister present on day of eval but is unable to help physcially.  She states the pt lives with the daughter and grandson.  Pt is currently aphasic and cannot give accurate information of home setup at this time.  Prior to admission to the SNF for pelvic fracture the pt's sister says she was living alone.   ADL  See FIM scale for details  Vision/Perception  Vision- History Baseline Vision/History: Wears glasses Wears Glasses: Reading only Vision- Assessment Vision Assessment?: Yes Eye Alignment: Impaired (comment) Ocular Range of Motion: Restricted looking up;Impaired-to be further tested in functional context Alignment/Gaze Preference: Chin down;Head tilt;Gaze left Tracking/Visual Pursuits: Decreased smoothness of horizontal tracking;Decreased smoothness of vertical tracking;Decreased smoothness of eye movement to RIGHT superior field;Decreased smoothness of eye movement to RIGHT inferior field Saccades: Impaired - to be further tested in functional context Convergence: Impaired - to be further tested in functional context Visual Fields: Right visual field deficit Additional Comments: Pt with left gaze preference and decreased efficiency when tracking moving object in all fields.  Lost fixation on object noted during testing in the right field however.  Pt maintains head tilt to the right and cervical flexion.  Cognition Overall Cognitive Status: Difficult to assess Arousal/Alertness: Awake/alert Focused Attention: Appears intact Sustained Attention: Impaired Sustained Attention Impairment: Functional basic Memory: Impaired Awareness: Impaired Awareness Impairment: Intellectual impairment Problem Solving: Impaired Problem Solving Impairment: Functional basic Comments: Unable to accurately assess  cognition secondary to expressive/receptive deficits.  Pt inconsistent with following one step commands related to selfcare tasks.  She needed max demonstrational cueing to progress from washing her face to washing the rest of her body.  Unable to identify what button to push for assistance on the call light as well. Sensation Sensation Light Touch: Impaired by gross assessment Stereognosis: Impaired by gross assessment Proprioception: Impaired by gross assessment Additional Comments: Sensation difficult to assess secondary to expressive and receptive difficulties Coordination Gross Motor Movements are Fluid and Coordinated: No Fine Motor Movements are Fluid and Coordinated: No Coordination and Movement Description: Pt able to exhibit gross flexion and extension in the right hand with visual attention sustained but only Brunnstrum stage III with elbow  and shoulder movement.  She needs max facilitation with functional use of the LUE in ADLs. Motor  Motor Motor: Hemiplegia;Motor apraxia;Abnormal postural alignment and control Motor - Skilled Clinical Observations: Pt with severely flexed posture in sitting and standing in the thoracic and cervical areas.  Right hemiparesis and decreased motor planning observed with stand pivot transfers and during LB selfcare.   Mobility  Bed Mobility Bed Mobility: Rolling Right;Rolling Left;Sit to Supine Rolling Right: 3: Mod assist Rolling Right Details: Visual cues/gestures for sequencing;Tactile cues for placement;Verbal cues for technique;Manual facilitation for weight shifting Rolling Left: 3: Mod assist Rolling Left Details: Verbal cues for technique;Manual facilitation for weight shifting;Tactile cues for placement;Visual cues/gestures for sequencing Sit to Supine: 1: +1 Total assist Sit to Supine - Details: Manual facilitation for weight shifting;Manual facilitation for placement;Verbal cues for sequencing;Visual cues/gestures for  sequencing Transfers Transfers: Sit to Stand;Stand to Sit Sit to Stand: From chair/3-in-1;From elevated surface;With upper extremity assist;2: Max assist Sit to Stand Details: Verbal cues for technique;Verbal cues for sequencing;Visual cues/gestures for sequencing;Manual facilitation for placement;Manual facilitation for weight shifting Stand to Sit: To chair/3-in-1;To bed;2: Max assist Stand to Sit Details (indicate cue type and reason): Visual cues/gestures for sequencing;Verbal cues for sequencing;Verbal cues for technique;Manual facilitation for weight shifting;Manual facilitation for placement  Trunk/Postural Assessment  Cervical Assessment Cervical Assessment: Exceptions to [facility] Cervical Strength Overall Cervical Strength Comments: Pt maintains cervical flexion and protraction with lateral flexion to the right side as well.  Thoracic Assessment Thoracic Assessment: Exceptions to [facility] Thoracic Strength Overall Thoracic Strength Comments: Pt with severe kyphosis and scoliosis in the thoracic region with forward shoulder rounding and scapular abduction bilaterally Lumbar Assessment Lumbar Assessment: Exceptions to [facility] Lumbar Strength Overall Lumbar Strength Comments: Pt with decreased lumbar extension to neutral, maintains slight flexion as well. Postural Control Postural Control: Deficits on evaluation Head Control: Pt needs max instructional cueing and mod assist to achieve midline orientation.  Lacks approximately -10 degrees to achieve neutral cervical position and then cannot maintain without cueing. Postural Limitations: Severe kyphosis with thoracic rounding,  decreased ability to activate trunk flexors or extensors for balance or forward transitions in preparation for standing.  Balance Balance Balance Assessed: Yes Static Sitting Balance Static Sitting - Balance Support: Left upper extremity supported;Feet supported Static Sitting - Level of Assistance: 2: Max assist Dynamic  Sitting Balance Dynamic Sitting - Balance Support: No upper extremity supported Dynamic Sitting - Level of Assistance: 2: Max assist Sitting balance - Comments: Pt with LOB posteriorly in sitting without ability to selfcorrect without max assist. Extremity/Trunk Assessment RUE Assessment RUE Assessment: Exceptions to [facility] RUE Strength RUE Overall Strength Comments: Pt with slight increased swelling in the digits and hand but does not restrict AROM.  AROM WFLS for all joints including the shoulder and elbow.  She is able to exhibit 80% gross grasp and release in the hand but difficulty using in function secondary to apraxia.  Brunnstrum stage III in the arm with slight increased tone noted in the biceps.   LUE Assessment LUE Assessment: Within Functional Limits (Per gross assessment with selfcare tasks.)  FIM:  FIM - Eating Eating Activity: 4: Helper checks for pocketed food FIM - Grooming Grooming Steps: Wash, rinse, dry face Grooming: 2: Patient completes 1 of 4 or 2 of 5 steps FIM - Bathing Bathing Steps Patient Completed: Chest;Abdomen;Right upper leg;Left upper leg Bathing: 2: Max-Patient completes 3-4 23f10 parts or 25-49% FIM - Upper Body Dressing/Undressing Upper body dressing/undressing steps patient completed: Put  head through opening of pull over shirt/dress Upper body dressing/undressing: 2: Max-Patient completed 25-49% of tasks FIM - Lower Body Dressing/Undressing Lower body dressing/undressing: 1: Total-Patient completed less than 25% of tasks FIM - Engineer, site Assistive Devices: Arm rests;Bed rails Bed/Chair Transfer: 1: Chair or W/C > Bed: Total A (helper does all/Pt. < 25%)   Refer to Care Plan for Long Term Goals  Recommendations for other services: None  Discharge Criteria: Patient will be discharged from OT if patient refuses treatment 3 consecutive times without medical reason, if treatment goals not met, if there is a change in medical  status, if patient makes no progress towards goals or if patient is discharged from hospital.  The above assessment, treatment plan, treatment alternatives and goals were discussed and mutually agreed upon: No family available/patient unable  During session began work on sitting and standing balance as well as initiation, cognitive processing, sequencing, and use of the RUE during session.  Pt overall max to total assist with decreased understanding and following of one step functional commands.  Increased thoracic, cervical, and lumbar kyphosis.  Pt with poor sitting balance resulting in the constant need for back support during ADLs.  Provided tilt in space wheelchair for support as well as ROHO cushion and arm trough for support on the RUE and to help with midline wheelchair positioning.   Durell Lofaso OTR/L 06/20/2014, 3:58 PM

## 2014-06-20 NOTE — Evaluation (Signed)
Speech Language Pathology Assessment and Plan  Patient Details  Name: Christy Miranda MRN: [id number] Date of Birth: 11/25/1921  SLP Diagnosis: Aphasia;Dysphagia;Cognitive Impairments  Rehab Potential: Good ELOS: ~14 days   Today's Date: 06/20/2014 Time: 6629-4765 Time Calculation (min): 60 min  Problem List:  Patient Active Problem List   Diagnosis Date Noted  . CVA (cerebral infarction) 06/19/2014  . Undernutrition 06/19/2014  . Fracture of multiple pubic rami 05/22/2014  . Encounter for therapeutic drug monitoring 01/02/2014  . Dizziness and giddiness 07/22/2013  . Thrombocytopenia 11/16/2012  . Deep venous thrombosis of lower leg 11/13/2012  . Subtherapeutic international normalized ratio (INR) 11/13/2012  . Pancytopenia 11/13/2012  . Edema 11/07/2012  . Pacemaker-Medtronic 08/19/2012  . Current use of long term anticoagulation 08/08/2011  . Atrial fibrillation 02/28/2011  . Tachycardia-bradycardia syndrome 02/28/2011  . Hypertension 02/28/2011   Past Medical History:  Past Medical History  Diagnosis Date  . HTN (hypertension)   . Permanent atrial fibrillation   . Bradycardia     s/p PPM  . Hypothyroidism   . Anxiety   . Diverticulitis     history of  . History of hysterectomy   . Long-term (current) use of anticoagulants   . Scoliosis (and kyphoscoliosis), idiopathic   . DVT (deep vein thrombosis) in pregnancy     right peroneal  . Dizziness and giddiness 07/22/2013   Past Surgical History:  Past Surgical History  Procedure Laterality Date  . Pacemaker insertion  03/03/2006    most recent generator (MDT) change 10/30/06 by Dr Verlon Setting  . Cardiac catheterization  01/08/1999    normal LV function  . Cardioversion  10/30/2006    Successful elective DC cardioversion  . Cardioversion  12/29/2002    Successful DC cardioversion  . Cardioversion  12/03/1999    Successful DC cardioversion  . Cholecystectomy      Assessment / Plan / Recommendation Clinical  Impression Christy Miranda is a 80 y.o. right-handed female with history of atrial fibrillation status post pacemaker with chronic Coumadin therapy with recent fall resulting in pelvic fractures and recent skilled nursing facility stay. Patient admitted 06/13/2014 with right-sided weakness as well as aphasia. INR on admission of 1.35. Patient did receive TPA. Follow-up cranial CT scan showed no 3.6 cm left basal ganglia parenchymal hemorrhage consistent with hemorrhagic transformation of acute infarct. 5 mm rightward midline shift. Neurology services consulted with workup ongoing. Aspirin held for post TPA hemorrhage. Repeat cranial CT scan 06/19/2014 shows slight interval decrease in size of hematoma centered in the left putamen, measuring 30 x 28 mm. Midline shift of 6 mm not significantly changed. Modified barium swallow study 06/15/2014 maintained on a dysphagia 1 honey-thick liquids via teaspoon diet. Physical occupational therapy evaluations completed with recommendations of physical medicine rehabilitation consult. Patient was admitted for comprehensive rehabilitation program 06/19/2014.  Orders received; Cognitive-Linguistic and Bedside Swallow Evaluations completed.  Patient demonstrates severe oral and pharyngeal sensorimotor dysphagia and appears to be tolerating diet with modifications and compensatory strategies per MBS with overall Max assist from clinician.  Patient also present with severe global aphasia impacting all 4 modes of language and cognition impairments in sustained attention which impact her ability to safely complete all basic self-care tasks.  As a result, skilled SLP services are warranted to address deficits and maximize functional independence as well as family education prior to discharge with 24/7 assist.    Skilled Therapeutic Interventions          Cognitive-linguistic and Bedside Swallow Evaluation completed  SLP Assessment  Patient will need skilled Speech Lanaguage  Pathology Services during CIR admission    Recommendations  Diet Recommendations: Dysphagia 1 (Puree);Honey-thick liquid Liquid Administration via: Spoon Medication Administration: Crushed with puree Supervision: Full supervision/cueing for compensatory strategies;Patient able to self feed Compensations: Slow rate;Small sips/bites;Check for pocketing;Check for anterior loss Postural Changes and/or Swallow Maneuvers: Seated upright 90 degrees;Upright 30-60 min after meal;Out of bed for meals Oral Care Recommendations: Oral care BID Patient destination:  (TBD) Follow up Recommendations: Home Health SLP;Skilled Nursing facility Equipment Recommended: To be determined    SLP Frequency 5 out of 7 days   SLP Treatment/Interventions Cognitive remediation/compensation;Cueing hierarchy;Dysphagia/aspiration precaution training;Environmental controls;Functional tasks;Internal/external aids;Multimodal communication approach;Oral motor exercises;Patient/family education;Speech/Language facilitation;Therapeutic Activities    Pain Pain Assessment Pain Assessment: No/denies pain Prior Functioning Cognitive/Linguistic Baseline: Information not available  Short Term Goals: Week 1: SLP Short Term Goal 1 (Week 1): Patient will consume Dys. 1 textures and honey-thick liquids via teaspoon with Min cues for pacing and portion control  to minimize overt s/s of aspiration. SLP Short Term Goal 2 (Week 1): Patient will participate in automatic verbal tasks wtih Max verbal and written cues. SLP Short Term Goal 3 (Week 1): Patient will identify named object from a field of 2 with Max multimodal cues. SLP Short Term Goal 4 (Week 1): Patient will solve basic problems related to self care with Mod multiodal cues.   See FIM for current functional status Refer to Care Plan for Long Term Goals  Recommendations for other services: None  Discharge Criteria: Patient will be discharged from SLP if patient refuses  treatment 3 consecutive times without medical reason, if treatment goals not met, if there is a change in medical status, if patient makes no progress towards goals or if patient is discharged from hospital.  The above assessment, treatment plan, treatment alternatives and goals were discussed and mutually agreed upon: No family available/patient unable  Gunnar Fusi, M.A., [place] 06/20/2014, 4:03 PM

## 2014-06-20 NOTE — Progress Notes (Signed)
Patient information reviewed and entered into eRehab system by Robie Mcniel, RN, CRRN, PPS Coordinator.  Information including medical coding and functional independence measure will be reviewed and updated through discharge.     Per nursing patient was given "Data Collection Information Summary for Patients in Inpatient Rehabilitation Facilities with attached "Privacy Act Statement-Health Care Records" upon admission.  

## 2014-06-20 NOTE — Evaluation (Signed)
Physical Therapy Assessment and Plan  Patient Details  Name: Radiance Deady MRN: [id number] Date of Birth: 07-08-1921  PT Diagnosis: Abnormal posture, Cognitive deficits, Difficulty walking, Hemiparesis dominant, Impaired cognition, Impaired sensation and Muscle weakness Rehab Potential: Fair ELOS: 14 days    Today's Date: 06/20/2014 Time: [id number] Time Calculation (min): 61 min  Problem List:  Patient Active Problem List   Diagnosis Date Noted  . CVA (cerebral infarction) 06/19/2014  . Undernutrition 06/19/2014  . Fracture of multiple pubic rami 05/22/2014  . Encounter for therapeutic drug monitoring 01/02/2014  . Dizziness and giddiness 07/22/2013  . Thrombocytopenia 11/16/2012  . Deep venous thrombosis of lower leg 11/13/2012  . Subtherapeutic international normalized ratio (INR) 11/13/2012  . Pancytopenia 11/13/2012  . Edema 11/07/2012  . Pacemaker-Medtronic 08/19/2012  . Current use of long term anticoagulation 08/08/2011  . Atrial fibrillation 02/28/2011  . Tachycardia-bradycardia syndrome 02/28/2011  . Hypertension 02/28/2011    Past Medical History:  Past Medical History  Diagnosis Date  . HTN (hypertension)   . Permanent atrial fibrillation   . Bradycardia     s/p PPM  . Hypothyroidism   . Anxiety   . Diverticulitis     history of  . History of hysterectomy   . Long-term (current) use of anticoagulants   . Scoliosis (and kyphoscoliosis), idiopathic   . DVT (deep vein thrombosis) in pregnancy     right peroneal  . Dizziness and giddiness 07/22/2013   Past Surgical History:  Past Surgical History  Procedure Laterality Date  . Pacemaker insertion  03/03/2006    most recent generator (MDT) change 10/30/06 by Dr Verlon Setting  . Cardiac catheterization  01/08/1999    normal LV function  . Cardioversion  10/30/2006    Successful elective DC cardioversion  . Cardioversion  12/29/2002    Successful DC cardioversion  . Cardioversion  12/03/1999    Successful DC  cardioversion  . Cholecystectomy      Assessment & Plan Clinical Impression: Patient is a 80 y.o. year old female with right-sided weakness as well as aphasia. INR on admission of 1.35. Initial cranial CT scan negative for acute changes. Patient did receive TPA. Followup cranial CT scan showed no 3.6 cm left basal ganglia parenchymal hemorrhage consistent with hemorrhagic transformation of acute infarct. 5 mm rightward midline shift.  Patient transferred to CIR on 06/19/2014 .   Patient currently requires total with mobility secondary to muscle weakness, decreased cardiorespiratoy endurance, impaired timing and sequencing, decreased coordination and decreased motor planning, decreased visual motor skills, decreased midline orientation, decreased attention to left and decreased motor planning and decreased initiation, decreased attention, decreased awareness, decreased problem solving and decreased safety awareness.  Prior to hospitalization, patient was at [facility] following hip fracture using RW, unsure of assist level.   Patient will benefit from skilled PT intervention to maximize safe functional mobility, minimize fall risk and decrease caregiver burden for planned discharge home with 24 hour assist.  Anticipate patient will benefit from follow up [facility] (vs SNF) at discharge.  PT - End of Session Activity Tolerance: Tolerates 10 - 20 min activity with multiple rests Endurance Deficit: Yes PT Assessment Rehab Potential: Fair Barriers to Discharge: Decreased caregiver support PT Patient demonstrates impairments in the following area(s): Balance;Endurance;Motor;Perception;Safety;Sensory;Skin Integrity PT Transfers Functional Problem(s): Bed Mobility;Bed to Chair;Car PT Locomotion Functional Problem(s): Wheelchair Mobility;Ambulation PT Plan PT Intensity: Minimum of 1-2 x/day ,45 to 90 minutes PT Frequency: 5 out of 7 days PT Duration Estimated Length of Stay: 14 days  PT  Treatment/Interventions:  Ambulation/gait training;Balance/vestibular training;Cognitive remediation/compensation;Discharge planning;DME/adaptive equipment instruction;Functional mobility training;Neuromuscular re-education;Patient/family education;Skin care/wound management;Therapeutic Activities;Therapeutic Exercise;UE/LE Strength taining/ROM;UE/LE Coordination activities;Visual/perceptual remediation/compensation;Wheelchair propulsion/positioning (amb/gait with therapy only) PT Transfers Anticipated Outcome(s): mod A PT Locomotion Anticipated Outcome(s): min A w/c level, do not anticipate pt being ambulatory at D/C.  PT Recommendation Follow Up Recommendations: Home health PT;Skilled nursing facility;24 hour supervision/assistance Patient destination: Home (home vs snf) Equipment Recommended: Wheelchair (measurements);Wheelchair cushion (measurements);To be determined  Skilled Therapeutic Intervention PT assessment and evaluation performed, see details below.  Large portion of PT eval focused on getting proper seating for pt due to severe kyphosis and scoliosis.  Transferred to therapy mat to assess impairments in sitting and also in supine to determine if fixed/flexible etc.  Recommend that she use reclining back to start with ROHO cushion.  May need to use [facility] comfort cushion in regular chair.  Will continue to assess seating needs.  No family present to discuss ELOS, rehab goals, etc.     PT Evaluation Precautions/Restrictions Precautions Precautions: Fall Precaution Comments: Rt sided hemiparesis, aphasia, severe kyphosis/scoliosis, recent pelvic fx, right neglect General   Vital SignsTherapy Vitals Temp: 98.6 F (37 C) Temp src: Oral Pulse Rate: 44 Resp: 17 BP: 165/73 mmHg Patient Position (if appropriate): Lying Oxygen Therapy SpO2: 92 % Pain:  No s/s of pain during session.    Home Living/Prior Functioning Home Living Additional Comments: Pt's sister present on day of eval (per OT) but is unable to  help physcially.  She states the pt lives with the daughter and grandson.  Pt is currently aphasic and cannot give accurate information of home setup at this time.  Prior to admission to the SNF for pelvic fracture the pt's sister says she was living alone.   Prior Function Comments: daughter states that prior to previous fall, patient was independent, has recently been at [facility] for rehab s/p fall and pelvic fc and per notes she was walking with RW following pelvic fracture.  Vision/Perception  Vision - Assessment Eye Alignment: Impaired (comment) Ocular Range of Motion: Restricted looking up;Impaired-to be further tested in functional context Alignment/Gaze Preference: Chin down;Head tilt;Gaze left Tracking/Visual Pursuits: Decreased smoothness of horizontal tracking;Decreased smoothness of vertical tracking;Decreased smoothness of eye movement to RIGHT superior field;Decreased smoothness of eye movement to RIGHT inferior field Saccades: Impaired - to be further tested in functional context Convergence: Impaired - to be further tested in functional context Additional Comments: Pt with left gaze preference and decreased efficiency when tracking moving object in all fields.  Lost fixation on object noted during testing in the right field however.  Pt maintains head tilt to the right and cervical flexion.  Cognition Overall Cognitive Status: Difficult to assess Arousal/Alertness: Awake/alert Focused Attention: Appears intact Sustained Attention: Impaired Sustained Attention Impairment: Functional basic Memory: Impaired Awareness: Impaired Awareness Impairment: Intellectual impairment Problem Solving: Impaired Problem Solving Impairment: Functional basic Comments: Unable to accurately assess cognition secondary to expressive/receptive deficits.  Pt inconsistent with following one step commands related to mobility. Unable to recall or point to correct  button call bell when questioned.    Sensation Sensation Light Touch: Impaired by gross assessment Stereognosis: Not tested Hot/Cold: Not tested Proprioception: Impaired by gross assessment Additional Comments: Sensation difficult to assess secondary to expressive and receptive difficulties Coordination Gross Motor Movements are Fluid and Coordinated: No Fine Motor Movements are Fluid and Coordinated: No Coordination and Movement Description: Pt able to perform gross movements of RLE, however pt with severe visual attention deficits and unable to  consistently move RLE when cued.  Motor  Motor Motor: Hemiplegia;Motor apraxia;Abnormal postural alignment and control Motor - Skilled Clinical Observations: Pt with severely flexed posture in sitting and standing in the thoracic and cervical areas.  Right hemiparesis and decreased motor planning observed with stand pivot transfers and during LB selfcare.    Mobility Bed Mobility Bed Mobility: Rolling Right;Rolling Left;Sit to Supine;Right Sidelying to Sit Rolling Right: 2: Max assist Rolling Right Details: Visual cues/gestures for sequencing;Tactile cues for placement;Verbal cues for technique;Manual facilitation for weight shifting;Manual facilitation for weight bearing Rolling Left: 2: Max assist;3: Mod assist Rolling Left Details: Verbal cues for technique;Manual facilitation for weight shifting;Tactile cues for placement;Visual cues/gestures for sequencing Right Sidelying to Sit: 2: Max assist;HOB flat Right Sidelying to Sit Details: Verbal cues for sequencing;Verbal cues for technique;Verbal cues for precautions/safety;Manual facilitation for weight shifting;Manual facilitation for weight bearing;Manual facilitation for placement Sit to Supine: 1: +1 Total assist Sit to Supine - Details: Manual facilitation for weight shifting;Manual facilitation for placement;Verbal cues for sequencing;Visual cues/gestures for sequencing Transfers Transfers: Yes Sit to Stand: From  chair/3-in-1;From elevated surface;With upper extremity assist;2: Max assist Sit to Stand Details: Verbal cues for technique;Verbal cues for sequencing;Visual cues/gestures for sequencing;Manual facilitation for placement;Manual facilitation for weight shifting Stand to Sit: To chair/3-in-1;To bed;2: Max assist Stand to Sit Details (indicate cue type and reason): Visual cues/gestures for sequencing;Verbal cues for sequencing;Verbal cues for technique;Manual facilitation for weight shifting;Manual facilitation for placement Squat Pivot Transfers: 1: +1 Total assist Squat Pivot Transfer Details: Verbal cues for sequencing;Verbal cues for technique;Verbal cues for precautions/safety;Manual facilitation for weight shifting;Manual facilitation for placement;Manual facilitation for weight bearing Locomotion  Ambulation Ambulation: No (unsafe at this time) Gait Gait: No Stairs / Additional Locomotion Stairs: No Wheelchair Mobility Wheelchair Mobility: No  Trunk/Postural Assessment  Cervical Assessment Cervical Assessment: Exceptions to [facility] Cervical Strength Overall Cervical Strength Comments: Pt maintains cervical flexion and protraction with lateral flexion to the right side as well.  Thoracic Assessment Thoracic Assessment: Exceptions to [facility] Thoracic Strength Overall Thoracic Strength Comments: Pt with severe kyphosis and scoliosis in the thoracic region with forward shoulder rounding and scapular abduction bilaterally Lumbar Assessment Lumbar Assessment: Exceptions to [facility] Lumbar Strength Overall Lumbar Strength Comments: Pt with decreased lumbar extension to neutral, maintains slight flexion as well. Postural Control Postural Control: Deficits on evaluation Head Control: Pt needs max instructional cueing and mod assist to achieve midline orientation.  Lacks approximately -10 degrees to achieve neutral cervical position and then cannot maintain without cueing. Postural Limitations: Severe  kyphosis with thoracic rounding,  decreased ability to activate trunk flexors or extensors for balance or forward transitions in preparation for standing.  Balance Balance Balance Assessed: Yes Static Sitting Balance Static Sitting - Balance Support: Left upper extremity supported;Feet supported Static Sitting - Level of Assistance: 2: Max assist Dynamic Sitting Balance Dynamic Sitting - Balance Support: No upper extremity supported Dynamic Sitting - Level of Assistance: 2: Max assist Sitting balance - Comments: Pt with LOB posteriorly in sitting without ability to selfcorrect without max assist. Extremity Assessment  RUE Assessment RUE Assessment: Exceptions to [facility] RUE Strength RUE Overall Strength Comments: Pt with slight increased swelling in the digits and hand but does not restrict AROM.  AROM WFLS for all joints including the shoulder and elbow.  She is able to exhibit 80% gross grasp and release in the hand but difficulty using in function secondary to apraxia.  Brunnstrum stage III in the arm with slight increased tone noted in the  biceps.   LUE Assessment LUE Assessment: Within Functional Limits (Per gross assessment with selfcare tasks.) RLE Assessment RLE Assessment: Exceptions to [facility] RLE Strength RLE Overall Strength: Deficits;Due to impaired cognition RLE Overall Strength Comments: Pt with grossly 2 to 2+/5 strength, however not consistent and could not carryover into transfers.  LLE Assessment LLE Assessment: Exceptions to [facility] LLE Strength LLE Overall Strength Comments: grossly WFL, difficult to asses due to decreased cognition.   FIM:  FIM - Bed/Chair Transfer Bed/Chair Transfer: 1: Chair or W/C > Bed: Total A (helper does all/Pt. < 25%)   Refer to Care Plan for Long Term Goals  Recommendations for other services: None  Discharge Criteria: Patient will be discharged from PT if patient refuses treatment 3 consecutive times without medical reason, if treatment goals  not met, if there is a change in medical status, if patient makes no progress towards goals or if patient is discharged from hospital.  The above assessment, treatment plan, treatment alternatives and goals were discussed and mutually agreed upon: No family available/patient unable  Denice Bors 06/20/2014, 5:00 PM

## 2014-06-21 ENCOUNTER — Encounter (HOSPITAL_COMMUNITY): Payer: Medicare Other | Admitting: Occupational Therapy

## 2014-06-21 ENCOUNTER — Inpatient Hospital Stay (HOSPITAL_COMMUNITY): Payer: Medicare Other | Admitting: Rehabilitation

## 2014-06-21 ENCOUNTER — Inpatient Hospital Stay (HOSPITAL_COMMUNITY): Payer: Medicare Other | Admitting: Occupational Therapy

## 2014-06-21 ENCOUNTER — Ambulatory Visit (HOSPITAL_COMMUNITY): Payer: Medicare Other | Admitting: Speech Pathology

## 2014-06-21 NOTE — Progress Notes (Signed)
Occupational Therapy Session Note  Patient Details  Name: Christy Miranda MRN: [id number] Date of Birth: Oct 23, 1921  Today's Date: 06/21/2014 Time: 6712-4580 Time Calculation (min): 46 min  Short Term Goals: Week 1:  OT Short Term Goal 1 (Week 1): Pt will initiate and complete UB bathing with no more than min assist and min instructional cueing in supported sitting. OT Short Term Goal 2 (Week 1): Pt will perform UB dressing with min assist in supported sitting. OT Short Term Goal 3 (Week 1): Pt will perform LB bathing with max assist sit to stand. OT Short Term Goal 4 (Week 1): Pt will maintain static sitting EOB with no more than min assist for 5 mins in preparation for selfcare tasks. OT Short Term Goal 5 (Week 1): Pt will use the RUE as a stabilizer with mod assist and mod instructional cueing to integrate into selfcare tasks.   Skilled Therapeutic Interventions/Progress Updates:    Pt transferred supine to sit EOB with total assist to the right side.  Min assist for initial static sitting balance with increased trunk and thoracic flexion in sitting.  Total assist needed for transfer stand pivot to the wheelchair with total assist as well.  Pt unable to maintain sitting EOC during UB bathing or dressing.  Total assist +2 (pt 30%) for all sit to stand and standing balance while therapist assisted with washing peri area or pulling pants/brief over her hips.  She needs max assist for integration of the RUE into bathing task with increased motor apraxia noted with attempted use.  Pt only able to state "yes" at times secondary to expressive deficits.  Able to follow 60% of one step demonstrational commands related to selfcare tasks.    Therapy Documentation Precautions:  Precautions Precautions: Fall Precaution Comments: Rt sided hemiparesis, aphasia, severe kyphosis/scoliosis, recent pelvic fx, right neglect Restrictions Weight Bearing Restrictions: No LLE Weight Bearing: Weight bearing as  tolerated  Pain: Pain Assessment Pain Assessment: No/denies pain ADL: See FIM for current functional status  Therapy/Group: Individual Therapy  Navi Ewton OTR/L 06/21/2014, 2:20 PM

## 2014-06-21 NOTE — Progress Notes (Signed)
Speech Language Pathology Daily Session Note  Patient Details  Name: Christy Miranda MRN: [id number] Date of Birth: 1921-02-07  Today's Date: 06/21/2014 Time: 5462-7035 Time Calculation (min): 58 min  Short Term Goals: Week 1: SLP Short Term Goal 1 (Week 1): Patient will consume Dys. 1 textures and honey-thick liquids via teaspoon with Min cues for pacing and portion control  to minimize overt s/s of aspiration. SLP Short Term Goal 2 (Week 1): Patient will participate in automatic verbal tasks wtih Max verbal and written cues. SLP Short Term Goal 3 (Week 1): Patient will identify named object from a field of 2 with Max multimodal cues. SLP Short Term Goal 4 (Week 1): Patient will solve basic problems related to self care with Mod multiodal cues.  SLP Short Term Goal 5 (Week 1): Patient will sustain attention to basic self-care tasks for 2 minutes with Mod cues for redirection.  Skilled Therapeutic Interventions: Skilled treatment session focused on addressing dysphagia and communication goals.  SLP facilitated set -up of tray with yes/no questions and Max contextual, verbal and visual cues; patient ~70% accurate with use of verbal "yes" and facial expressions for no.  SLP also facilitated session with Min verbal and visual cues for pacing and alternating between texture and liquid consistencies; Max verbal and visual cues to attend to and manage right anterior loss of boluses; Min physical assist for scooping.  Patient with some motor planning impairments for switching between tasks such as feeding and wiping mouth or blowing nose.  Spontaneous verbal expression remains jargon with no awareness of errors.      FIM:  Comprehension Comprehension Mode: Auditory Comprehension: 2-Understands basic 25 - 49% of the time/requires cueing 51 - 75% of the time Expression Expression Mode: Verbal Expression: 1-Expresses basis less than 25% of the time/requires cueing greater than 75% of the time. Social  Interaction Social Interaction: 2-Interacts appropriately 25 - 49% of time - Needs frequent redirection. Problem Solving Problem Solving: 1-Solves basic less than 25% of the time - needs direction nearly all the time or does not effectively solve problems and may need a restraint for safety Memory Memory: 1-Recognizes or recalls less than 25% of the time/requires cueing greater than 75% of the time FIM - Eating Eating Activity: 4: Helper checks for pocketed food;5: Needs verbal cues/supervision;4: Helper occasionally scoops food on utensil;4: Help with picking up utensils  Pain Pain Assessment Pain Assessment: No/denies pain Pain Score: 2  Pain Intervention(s): Medication (See eMAR)  Therapy/Group: Individual Therapy  Carmelia Roller., CCC-SLP [phone]  [place] 06/21/2014, 11:57 AM

## 2014-06-21 NOTE — Progress Notes (Signed)
Physical Therapy Session Note  Patient Details  Name: Christy Miranda MRN: [id number] Date of Birth: [birth year]  Today's Date: 06/21/2014 Time: 1310-1400 Time Calculation (min): 50 min  Short Term Goals: Week 1:  PT Short Term Goal 1 (Week 1): Pt will roll R and L with min A with use of bedrails PT Short Term Goal 2 (Week 1): Pt will perform squat pivot transfer at max A level to the R or L PT Short Term Goal 3 (Week 1): Pt will perform dymamic sitting at mod A level PT Short Term Goal 4 (Week 1): Pt will perform sit<>stand at max A level PT Short Term Goal 5 (Week 1): Pt will self propel w/c using L hemi technique at max A level  Skilled Therapeutic Interventions/Progress Updates:   Pt received sitting in w/c in room, agreeable to therapy.  Nurse tech in room requesting assist to get onto toilet to attempt voiding/assess for incontinence.  Assisted into restroom via w/c and performed stand pivot transfer at max A level with improved initiation of tasks and ability to reach for grab bar and advance LEs.  Pt successful in voiding urine.  Nurse tech assisted with peri care.  Transferred back to w/c and assisted down to therapy gym.  Skilled session focused on standing for quality, tolerance and short distance gait in // bars.  Performed standing x 4 reps at total progressing to light mod A with UE support.  Also performed 8' gait x 2 reps with max A.  Pt able to initially advance RLE, however then requires assist to advance.  Feel due to attention more so than weakness.  Switched pts reclining back chair with hemi height 16x16 and will address donning new back for chair tomorrow.  Note that Roho cushion needed to be deflated slightly due to more than 2 inch difference between ischial tuberosities and bottom of cushion.  Assisted pt back to room and back to bed via stand pivot at max A.  Left in bed with all needs in reach and bed alarm set.   Therapy Documentation Precautions:   Precautions Precautions: Fall Precaution Comments: Rt sided hemiparesis, aphasia, severe kyphosis/scoliosis, recent pelvic fx, right neglect Restrictions Weight Bearing Restrictions: No LLE Weight Bearing: Weight bearing as tolerated General: Amount of Missed PT Time (min): 10 Minutes Missed Time Reason: Other (comment) (PT late from meeting) Vital Signs: Therapy Vitals Temp: 98.2 F (36.8 C) Temp src: Oral Pulse Rate: 79 Resp: 18 BP: 151/75 mmHg Patient Position (if appropriate): Lying Oxygen Therapy SpO2: 97 % O2 Device: None (Room air) Pain: Pain Assessment Pain Assessment: No/denies pain   Locomotion : Ambulation Ambulation/Gait Assistance: 1: +2 Total assist   See FIM for current functional status  Therapy/Group: Individual Therapy  Denice Bors 06/21/2014, 3:52 PM

## 2014-06-21 NOTE — Progress Notes (Signed)
80 y.o. right-handed female with history of atrial fibrillation status post pacemaker with chronic Coumadin therapy, recent fall with pelvic fractures and had recently been at skilled nursing facility secondary to pelvic fractures.. Admitted 06/13/2049 with right-sided weakness as well as aphasia. INR on admission of 1.35. Initial cranial CT scan negative for acute changes. Patient did receive TPA. Followup cranial CT scan showed no 3.6 cm left basal ganglia parenchymal hemorrhage consistent with hemorrhagic transformation of acute infarct. 5 mm rightward midline shift. Echocardiogram with ejection fraction of 55% no wall motion abnormalities. Carotid Dopplers with no ICA stenosis. Neurology services consulted with workup ongoing. Aspirin held for post TPA hemorrhage. Repeat cranial CT scan 06/19/2014 shows slight interval decrease in size of hematoma centered in the left putamen, measuring 30 x 28 mm  Subjective/Complaints:  Pt aphasic unable to verbalize complaints Low grade temp Objective: Vital Signs: Blood pressure 159/77, pulse 98, temperature 99.4 F (37.4 C), temperature source Oral, resp. rate 18, height 5' (1.524 m), weight 53.343 kg (117 lb 9.6 oz), SpO2 94.00%. No results found. Results for orders placed during the hospital encounter of 06/19/14 (from the past 72 hour(s))  CBC WITH DIFFERENTIAL     Status: Abnormal   Collection Time    06/20/14  4:30 AM      Result Value Ref Range   WBC 3.5 (*) 4.0 - 10.5 K/uL   RBC 3.38 (*) 3.87 - 5.11 MIL/uL   Hemoglobin 10.6 (*) 12.0 - 15.0 g/dL   HCT 32.4 (*) 36.0 - 46.0 %   MCV 95.9  78.0 - 100.0 fL   MCH 31.4  26.0 - 34.0 pg   MCHC 32.7  30.0 - 36.0 g/dL   RDW 16.3 (*) 11.5 - 15.5 %   Platelets 103 (*) 150 - 400 K/uL   Comment: CONSISTENT WITH PREVIOUS RESULT   Neutrophils Relative % 71  43 - 77 %   Neutro Abs 2.5  1.7 - 7.7 K/uL   Lymphocytes Relative 20  12 - 46 %   Lymphs Abs 0.7  0.7 - 4.0 K/uL   Monocytes Relative 8  3 - 12 %    Monocytes Absolute 0.3  0.1 - 1.0 K/uL   Eosinophils Relative 1  0 - 5 %   Eosinophils Absolute 0.1  0.0 - 0.7 K/uL   Basophils Relative 0  0 - 1 %   Basophils Absolute 0.0  0.0 - 0.1 K/uL  COMPREHENSIVE METABOLIC PANEL     Status: Abnormal   Collection Time    06/20/14  4:30 AM      Result Value Ref Range   Sodium 138  137 - 147 mEq/L   Potassium 3.6 (*) 3.7 - 5.3 mEq/L   Chloride 102  96 - 112 mEq/L   CO2 25  19 - 32 mEq/L   Glucose, Bld 92  70 - 99 mg/dL   BUN 12  6 - 23 mg/dL   Creatinine, Ser 0.37 (*) 0.50 - 1.10 mg/dL   Calcium 8.2 (*) 8.4 - 10.5 mg/dL   Total Protein 5.6 (*) 6.0 - 8.3 g/dL   Albumin 2.6 (*) 3.5 - 5.2 g/dL   AST 17  0 - 37 U/L   ALT 10  0 - 35 U/L   Alkaline Phosphatase 79  39 - 117 U/L   Total Bilirubin 0.6  0.3 - 1.2 mg/dL   GFR calc non Af Amer >90  >90 mL/min   GFR calc Af Amer >90  >90 mL/min  Comment: (NOTE)     The eGFR has been calculated using the CKD EPI equation.     This calculation has not been validated in all clinical situations.     eGFR's persistently <90 mL/min signify possible Chronic Kidney     Disease.   Anion gap 11  5 - 15      Constitutional:  80 year old frail white female. Head forward posture and leaning to the right. Eyes:  Pupils reactive to light  Neck: Normal range of motion. Neck supple. No thyromegaly present.  Cardiovascular:  Irreg Irreg. No murmur  Respiratory: Effort normal and breath sounds normal. No respiratory distress.  GI: Soft. Bowel sounds are normal. She exhibits no distension. Non-tender Neurological:  Patient is alert. Left gaze preference. She was expressively and receptively aphasic with apraxia. She did follow a few simple commands. Made eye contact when cued.RUE: 0/5 deltoid, bicep, tricep, 1/5 finger flex. RLE: 3- HF, KE, and 2- ADF/PF but inconsistent. Sensation diminished but senses pinch right arm/leg. Increased tone R biceps MAS1 Skin: Skin is warm and dry  Psych: flat, calm,  collected   Assessment/Plan: 1. Functional deficits secondary to  left basal ganglia parenchymal hemorrhage consistent with hemorrhagic transformation of acute infarct which require 3+ hours per day of interdisciplinary therapy in a comprehensive inpatient rehab setting. Physiatrist is providing close team supervision and 24 hour management of active medical problems listed below. Physiatrist and rehab team continue to assess barriers to discharge/monitor patient progress toward functional and medical goals. Team conference today please see physician documentation under team conference tab, met with team face-to-face to discuss problems,progress, and goals. Formulized individual treatment plan based on medical history, underlying problem and comorbidities. FIM: FIM - Bathing Bathing Steps Patient Completed: Chest;Abdomen;Right upper leg;Left upper leg Bathing: 2: Max-Patient completes 3-4 16f10 parts or 25-49%  FIM - Upper Body Dressing/Undressing Upper body dressing/undressing steps patient completed: Put head through opening of pull over shirt/dress Upper body dressing/undressing: 2: Max-Patient completed 25-49% of tasks FIM - Lower Body Dressing/Undressing Lower body dressing/undressing: 1: Total-Patient completed less than 25% of tasks        FIM - BEngineer, siteAssistive Devices: Arm rests;Bed rails Bed/Chair Transfer: 1: Chair or W/C > Bed: Total A (helper does all/Pt. < 25%)  FIM - Locomotion: Wheelchair Locomotion: Wheelchair: 1: Total Assistance/staff pushes wheelchair (Pt<25%) FIM - Locomotion: Ambulation Locomotion: Ambulation: 0: Activity did not occur (not safe at this time)  Comprehension Comprehension Mode: Auditory Comprehension: 2-Understands basic 25 - 49% of the time/requires cueing 51 - 75% of the time  Expression Expression Mode: Verbal Expression: 1-Expresses basis less than 25% of the time/requires cueing greater than 75% of the  time.  Social Interaction Social Interaction: 2-Interacts appropriately 25 - 49% of time - Needs frequent redirection.  Problem Solving Problem Solving: 2-Solves basic 25 - 49% of the time - needs direction more than half the time to initiate, plan or complete simple activities  Memory Memory: 1-Recognizes or recalls less than 25% of the time/requires cueing greater than 75% of the time  Medical Problem List and Plan:  1. 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 No aspirin at this time repeat CT scan in one week to decide upon initiation of aspirin  2. DVT Prophylaxis/Anticoagulation: SCDs. Monitor for any signs of DVT  3. Pain Management: Tylenol as needed  4. Mood/anxiety. Xanax 0.25 mg 3 times a day as needed. Provide emotional support:  5.  Neuropsych: This patient is not capable of making decisions on her own behalf.   6. Dysphagia. Dysphagia 1 with honey thick liquids. Continue IV fluids for hydration. Followup chemistries  7. History of atrial fibrillation/pacemaker with chronic Coumadin. Coumadin held secondary to hemorrhagic transformation x 3-4 wk.  To f/u with Neuro post D/C to determine restart date on warfarin Cardiac rate controlled. Continue Toprol 50 mg twice a day for rate control  8. Recent pelvic fracture. Weightbearing as tolerated. No pain with LE ROM 9. Hypothyroidism. Synthroid 10.  Low grade temp- incont, check UA  LOS (Days) 2 A FACE TO FACE EVALUATION WAS PERFORMED  Christy Miranda E 06/21/2014, 8:08 AM

## 2014-06-21 NOTE — Progress Notes (Signed)
Occupational Therapy Session Note  Patient Details  Name: [name] MRN: [id number] Date of Birth: 1921-12-01  Today's Date: 06/21/2014 Time: 0626-9485 Time Calculation (min): 30 min  Skilled Therapeutic Interventions/Progress Updates:    Pt performed supine to sit EOB with total assist and max demonstrational cueing for sequencing during session.  She was able to sit EOB with increased posterior pelvic tilt and posterior lean.  Worked on pelvic and lumbar mobility in sitting with cervical extension as well.  Pt needing max facilitation to achieve and maintain posture once she is positioned.   Transitioned to working on maintaining anterior pelvic tilt while placing bilateral UEs on bedside table, and pushing it forward.  She needed max facilitation at the trunk and head to maintain some neutral pelvic tilt and transition forward.  Progressed to standing as well with max assist and arms supported on the bedside table.   Therapy Documentation Precautions:  Precautions Precautions: Fall Precaution Comments: Rt sided hemiparesis, aphasia, severe kyphosis/scoliosis, recent pelvic fx, right neglect Restrictions Weight Bearing Restrictions: No LLE Weight Bearing: Weight bearing as tolerated  Pain: Pain Assessment Pain Assessment: Faces Pain Score: 0-No pain ADL: See FIM for current functional status  Therapy/Group: Individual Therapy  Le Ferraz OTR/L 06/21/2014, 4:29 PM

## 2014-06-22 ENCOUNTER — Encounter (HOSPITAL_COMMUNITY): Payer: Medicare Other

## 2014-06-22 ENCOUNTER — Inpatient Hospital Stay (HOSPITAL_COMMUNITY): Payer: Medicare Other | Admitting: Occupational Therapy

## 2014-06-22 ENCOUNTER — Ambulatory Visit (HOSPITAL_COMMUNITY): Payer: Medicare Other | Admitting: Speech Pathology

## 2014-06-22 ENCOUNTER — Inpatient Hospital Stay (HOSPITAL_COMMUNITY): Payer: Medicare Other | Admitting: Rehabilitation

## 2014-06-22 DIAGNOSIS — I6330 Cerebral infarction due to thrombosis of unspecified cerebral artery: Secondary | ICD-10-CM

## 2014-06-22 DIAGNOSIS — I4891 Unspecified atrial fibrillation: Secondary | ICD-10-CM

## 2014-06-22 DIAGNOSIS — I10 Essential (primary) hypertension: Secondary | ICD-10-CM

## 2014-06-22 DIAGNOSIS — Z5189 Encounter for other specified aftercare: Secondary | ICD-10-CM

## 2014-06-22 NOTE — Progress Notes (Signed)
Occupational Therapy Session Note  Patient Details  Name: Christy Miranda MRN: [id number] Date of Birth: 12-23-1920  Today's Date: 06/22/2014 Time: 1030-1130 60 Minutes  Short Term Goals: Week 1:  OT Short Term Goal 1 (Week 1): Pt will initiate and complete UB bathing with no more than min assist and min instructional cueing in supported sitting. OT Short Term Goal 2 (Week 1): Pt will perform UB dressing with min assist in supported sitting. OT Short Term Goal 3 (Week 1): Pt will perform LB bathing with max assist sit to stand. OT Short Term Goal 4 (Week 1): Pt will maintain static sitting EOB with no more than min assist for 5 mins in preparation for selfcare tasks. OT Short Term Goal 5 (Week 1): Pt will use the RUE as a stabilizer with mod assist and mod instructional cueing to integrate into selfcare tasks.   Skilled Therapeutic Interventions/Progress Updates: ADL-retraining with focus on supported sitting, sit<>stand, use of RUE as stabilizer, and task initiation.   Pt received supine in bed but alert and receptive to treatment planned, assisted bathing/dressing at w/c level.   Patient able to partially verbalize her preference to defer use of bra and also gestured from clothing presented by using her left hand and effective  eye contact, when caregiver visual field is adjusted to meet gaze d/t kyphotic posture with moderate neck flexion.   Patient required total assist for bed mobility and transfer to w/c although contributing approx 20% of effort using left UE and LE.  Patient completed bathing at sink with max assist but initiated washing her face, chest, and right arm when presented with washcloth, and applied lotion to her left leg as directed.   Pt independently corrected posture when cued for 3 seconds during bathing to allow assist with washing her back.   After assist with donning pants, pt stood at sink, with only mod assist to rise from w/c and maintained static standing balance at sink  with mod assist while OT pulled her pants over her hips (total assist).   Sitting posture improved slightly after w/c footrests were replaced allowing more equal distribution of LE.   Patient completed upper body dressing with max assist but combed the left side of hair as directed after setup with comb.  Pt left in w/c at RN station, dressed, with safety belt affixed.     Therapy Documentation Precautions:  Precautions Precautions: Fall Precaution Comments: Rt sided hemiparesis, aphasia, severe kyphosis/scoliosis, recent pelvic fx, right neglect Restrictions Weight Bearing Restrictions: No LLE Weight Bearing: Weight bearing as tolerated  Vital Signs: Therapy Vitals Temp: 98.3 F (36.8 C) Temp src: Oral Pulse Rate: 68 Resp: 18 BP: 138/65 mmHg Patient Position (if appropriate): Lying Oxygen Therapy SpO2: 96 % O2 Device: None (Room air)  Pain: No/denies pain    See FIM for current functional status  Therapy/Group: Individual Therapy  Noelene Gang 06/22/2014, 7:45 AM

## 2014-06-22 NOTE — Progress Notes (Signed)
Inpatient Rehabilitation Center Individual Statement of Services  Patient Name:  Mykenzi Vanzile  Date:  06/22/2014  Welcome to the [facility].  Our goal is to provide you with an individualized program based on your diagnosis and situation, designed to meet your specific needs.  With this comprehensive rehabilitation program, you will be expected to participate in at least 3 hours of rehabilitation therapies Monday-Friday, with modified therapy programming on the weekends.  Your rehabilitation program will include the following services:  Physical Therapy (PT), Occupational Therapy (OT), Speech Therapy (ST), 24 hour per day rehabilitation nursing, Case Management (Social Worker), Rehabilitation Medicine, Nutrition Services and Pharmacy Services  Weekly team conferences will be held on Wednesdays to discuss your progress.  Your Social Worker will talk with you frequently to get your input and to update you on team discussions.  Team conferences with you and your family in attendance may also be held.  Expected length of stay:  2 weeks Overall anticipated outcome:  Moderate assistance  Depending on your progress and recovery, your program may change. Your Social Worker will coordinate services and will keep you informed of any changes. Your Social Worker's name and contact numbers are listed  below.  The following services may also be recommended but are not provided by the [facility] will be made to provide these services after discharge if needed.  Arrangements include referral to agencies that provide these services.  Your insurance has been verified to be:  Medicare and [employer] Your primary doctor is:  Dr. Jonathon Jordan  Pertinent information will be shared with your doctor and your insurance company.  Social Worker:  Alfonse Alpers,  LCSW  [phone] or ([phone]  Information discussed with and copy given to patient by: Trey Sailors, 06/22/2014, 9:07 AM

## 2014-06-22 NOTE — Patient Care Conference (Signed)
Inpatient RehabilitationTeam Conference and Plan of Care Update Date: 06/21/2014   Time: 11:30 AM    Patient Name: Christy Miranda      Medical Record Number: [id number]  Date of Birth: Mar 08, 1921 Sex: Female         Room/Bed: 4W17C/4W17C-01 Payor Info: Payor: MEDICARE / Plan: MEDICARE PART A AND B / Product Type: *No Product type* /    Admitting Diagnosis: L CVA  Admit Date/Time:  06/19/2014  6:51 PM Admission Comments: No comment available   Primary Diagnosis:  <principal problem not specified> Principal Problem: <principal problem not specified>  Patient Active Problem List   Diagnosis Date Noted  . CVA (cerebral infarction) 06/19/2014  . Undernutrition 06/19/2014  . Fracture of multiple pubic rami 05/22/2014  . Encounter for therapeutic drug monitoring 01/02/2014  . Dizziness and giddiness 07/22/2013  . Thrombocytopenia 11/16/2012  . Deep venous thrombosis of lower leg 11/13/2012  . Subtherapeutic international normalized ratio (INR) 11/13/2012  . Pancytopenia 11/13/2012  . Edema 11/07/2012  . Pacemaker-Medtronic 08/19/2012  . Current use of long term anticoagulation 08/08/2011  . Atrial fibrillation 02/28/2011  . Tachycardia-bradycardia syndrome 02/28/2011  . Hypertension 02/28/2011    Expected Discharge Date: Expected Discharge Date: 07/03/14  Team Members Present: Physician leading conference: Dr. Alysia Penna Social Worker Present: Alfonse Alpers, LCSW Nurse Present: Elliot Cousin, RN PT Present: Cameron Sprang, PT OT Present: Clyda Greener, OT SLP Present: Gunnar Fusi, SLP PPS Coordinator present : Daiva Nakayama, RN, CRRN;Becky Alwyn Ren, PT     Current Status/Progress Goal Weekly Team Focus  Medical   very slow with feeding, D1 honey via tsp, severe scoliosis, positioning issues  meet fluid and calorie needs po  WC positioing, encourage intake   Bowel/Bladder   incont.of bowel and bladder. LBM 06/20/14  Mangaged bowel and bladder  Timed time toileting during  the day   Swallow/Nutrition/ Hydration   Dys 1 textures and honey-thick liquids via teaspoon with Mod assist   least restrictive PO with Min assist   pharyngeal strengthening exercises, Dys 2 trials, carryover od safe swallow strategies   ADL's   Pt currently performs functional transfers at a total assist stand pivot level. She needs total assist +2 (pt 30%) for LB selfcare tasks sit to stand and for toileting tasks. Max assist also needed for UB dressing with bathing performed at mod assist level. She demonstrates Brunnstrum stage III in right arm and stage IV in the hand. Decreased motor planning also notes as well as possible right visual field cut and neglect.  overall mod assist for LB selfcare and functional transfers except for shower transfers which are set at max assist level currently.  selfcare retraining, sustained attention, neuromuscular re-duction, pt/family education, positioning.  Mobility   Pt currently requires mod to total A for bed mobility, total A for squat pivot transfers, max to total A for standing.  did not assess standing balance or gait/stairs due to safety.  Spent a lot of time of w/c positioning/seating needs due to severe kyphosis/scoliosis.   mod A transfers, min A w/c level (may have to downgrade due to cognition)  bed mobility, transfers, standing, sitting balance, d/c planning   Communication   Max-Total receptive and Total expressive   Max assist   increase automatic responses within context, increase awareness of errors   Safety/Cognition/ Behavioral Observations  Max-Total assist   Max assist   increase awareness of perseveration and timely cessation to tasks   Pain   No c/o pain   Pain <  3  Assess for and treat pain q shift as needed    Skin   Scab to L ankle, Bruising to R leg, L eye, and R arm  No new breakdown  Assess skin q shift for breakdown    Rehab Goals Patient on target to meet rehab goals: Yes Rehab Goals Revised: None - this is pt's first  conference *See Care Plan and progress notes for long and short-term goals.  Barriers to Discharge: very elderly, heavy assist    Possible Resolutions to Barriers:  ?SNF    Discharge Planning/Teaching Needs:  Pt will either go home with dtr and paid caregivers or go to a SNF.  Dtr only works 3 days a week and can be here for family education as needed.   Team Discussion:  Pt had a stroke while at a SNF where she was recovering from a pelvic fracture.  UA in process for possible UTI.  Pt is currently max-total assist with squat pivot transfers.  She has movement, but no sensation on her right side.  Pt will need 24/7 mod-max assist at home.  Pt has little awareness of food pooling or draining from her mouth, per SLP.  She was able to feed herself.  Pt is on a D1 honey thick liquids via teaspoon diet, thus still requiring fluids at night.  Pt has receptive and expressive difficulties.    Revisions to Treatment Plan:  None   Continued Need for Acute Rehabilitation Level of Care: The patient requires daily medical management by a physician with specialized training in physical medicine and rehabilitation for the following conditions: Daily direction of a multidisciplinary physical rehabilitation program to ensure safe treatment while eliciting the highest outcome that is of practical value to the patient.: Yes Daily medical management of patient stability for increased activity during participation in an intensive rehabilitation regime.: Yes Daily analysis of laboratory values and/or radiology reports with any subsequent need for medication adjustment of medical intervention for : Neurological problems  Joab Carden, [name] 06/22/2014, 12:36 PM

## 2014-06-22 NOTE — Progress Notes (Signed)
Speech Language Pathology Daily Session Note  Patient Details  Name: Christy Miranda MRN: [id number] Date of Birth: 09-03-1921  Today's Date: 06/22/2014 Time: 0830-0930 Time Calculation (min): 60 min  Short Term Goals: Week 1: SLP Short Term Goal 1 (Week 1): Patient will consume Dys. 1 textures and honey-thick liquids via teaspoon with Min cues for pacing and portion control  to minimize overt s/s of aspiration. SLP Short Term Goal 2 (Week 1): Patient will participate in automatic verbal tasks wtih Max verbal and written cues. SLP Short Term Goal 3 (Week 1): Patient will identify named object from a field of 2 with Max multimodal cues. SLP Short Term Goal 4 (Week 1): Patient will solve basic problems related to self care with Mod multiodal cues.  SLP Short Term Goal 5 (Week 1): Patient will sustain attention to basic self-care tasks for 2 minutes with Mod cues for redirection.  Skilled Therapeutic Interventions: Skilled treatment session focused on addressing dysphagia and communication goals.  SLP attempted to teach pharyngeal strengthening exercises with Max assist multimodal cues; however, due to severity of receptive deficits patient unable to perform.  As a result, dysphagia treatment will addressed during the context of a meal.  SLP facilitated set-up of tray with yes/no questions and Max contextual, verbal and visual cues; patient ~70% accurate with use of verbal "yes" and non-verbal for no.  SLP also facilitated session with Min verbal and visual cues for pacing and alternating between texture and liquid consistencies; Total hand-over-hand assist to and manage right anterior loss of boluses; Min physical assist for scooping.  Patient with some motor planning impairments as well as perseveration for switching between tasks such as feeding and wiping mouth.  Spontaneous verbal expression remains jargon with no awareness of errors.  1-step directions out of context were 60% accurate with motor  planning and perseveration; visual cues increased accuracy to 70%. Continue with current plan of care.    FIM:  Comprehension Comprehension Mode: Auditory Comprehension: 2-Understands basic 25 - 49% of the time/requires cueing 51 - 75% of the time Expression Expression Mode: Verbal Expression: 1-Expresses basis less than 25% of the time/requires cueing greater than 75% of the time. Social Interaction Social Interaction: 2-Interacts appropriately 25 - 49% of time - Needs frequent redirection. Problem Solving Problem Solving: 1-Solves basic less than 25% of the time - needs direction nearly all the time or does not effectively solve problems and may need a restraint for safety Memory Memory: 1-Recognizes or recalls less than 25% of the time/requires cueing greater than 75% of the time FIM - Eating Eating Activity: 4: Helper checks for pocketed food;5: Needs verbal cues/supervision;4: Helper occasionally scoops food on utensil;4: Help with picking up utensils  Pain Pain Assessment Pain Assessment: No/denies pain  Therapy/Group: Individual Therapy  Carmelia Roller., CCC-SLP [phone]  [place] 06/22/2014, 9:08 AM

## 2014-06-22 NOTE — Progress Notes (Signed)
Occupational Therapy Session Note  Patient Details  Name: Christy Miranda MRN: [id number] Date of Birth: 1921/10/04  Today's Date: 06/22/2014 Time: 7482-7078 Time Calculation (min): 35 min  Skilled Therapeutic Interventions/Progress Updates:    Pt sitting in wheelchair during session with pt's daughter and sister present for session.  Educated daughter and pt on AAROM exercises for the RUE as well as providing a handout.  Pt's daughter also able to safely return demonstrate.  Pt is able to initiate some digit flexion and extension but unable to stabilize at the wrist when attempting.  Only able to exhibit approximately 50% gross digit extension secondary to decreased motor planning.  Pt repositioned in chair as well with emphasis on maintaining cervical extension to be able to see the TV.    Therapy Documentation Precautions:  Precautions Precautions: Fall Precaution Comments: Rt sided hemiparesis, aphasia, severe kyphosis/scoliosis, recent pelvic fx, right neglect Restrictions Weight Bearing Restrictions: No LLE Weight Bearing: Weight bearing as tolerated  Pain: Pain Assessment Pain Assessment: No/denies pain ADL: See FIM for current functional status  Therapy/Group: Individual Therapy  Bryar Dahms OTR/L 06/22/2014, 3:44 PM

## 2014-06-22 NOTE — IPOC Note (Signed)
Overall Plan of Care [facility]) Patient Details Name: Chalyn Amescua MRN: [id number] DOB: May 20, 1921  Admitting Diagnosis: L CVA  Hospital Problems: Active Problems:   CVA (cerebral infarction)     Functional Problem List: Nursing Bladder;Bowel;Medication Management;Nutrition;Pain;Safety;Skin Integrity  PT Balance;Endurance;Motor;Perception;Safety;Sensory;Skin Integrity  OT Balance;Cognition;Edema;Endurance;Motor;Perception;Safety;Sensory;Skin Integrity;Vision  SLP Cognition;Linguistic;Nutrition;Safety  TR         Basic ADL's: OT Eating;Grooming;Bathing;Dressing;Toileting     Advanced  ADL's: OT       Transfers: PT Bed Mobility;Bed to Chair;Car  OT Toilet;Tub/Shower     Locomotion: Advertising account planner;Ambulation     Additional Impairments: OT Fuctional Use of Upper Extremity  SLP Swallowing;Communication;Social Cognition comprehension;expression Social Interaction;Problem Solving;Memory;Attention;Awareness  TR      Anticipated Outcomes Item Anticipated Outcome  Self Feeding supervision  Swallowing  Min assist    Basic self-care  mod to max assist  Toileting  mod assist   Bathroom Transfers mod assist  Bowel/Bladder  cont of bowel and bladder  Transfers  mod A  Locomotion  min A w/c level, do not anticipate pt being ambulatory at D/C.   Communication  Max assist   Cognition  Max assist   Pain  less than pain  Safety/Judgment  adhere to safety protocol in place   Therapy Plan: PT Intensity: Minimum of 1-2 x/day ,45 to 90 minutes PT Frequency: 5 out of 7 days PT Duration Estimated Length of Stay: 14 days  OT Intensity: Minimum of 1-2 x/day, 45 to 90 minutes OT Frequency: 5 out of 7 days OT Duration/Estimated Length of Stay: 12-14 days  SLP Intensity: Minumum of 1-2 x/day, 30 to 90 minutes SLP Frequency: 5 out of 7 days SLP Duration/Estimated Length of Stay: ~14 days       Team Interventions: Nursing Interventions Pain Management;Bowel  Management;Bladder Management;Medication Management;Patient/Family Education;Discharge Planning;Disease Management/Prevention  PT interventions Ambulation/gait training;Balance/vestibular training;Cognitive remediation/compensation;Discharge planning;DME/adaptive equipment instruction;Functional mobility training;Neuromuscular re-education;Patient/family education;Skin care/wound management;Therapeutic Activities;Therapeutic Exercise;UE/LE Strength taining/ROM;UE/LE Coordination activities;Visual/perceptual remediation/compensation;Wheelchair propulsion/positioning (amb/gait with therapy only)  OT Interventions Balance/vestibular training;DME/adaptive equipment instruction;Community reintegration;Functional electrical stimulation;Discharge planning;Functional mobility training;Psychosocial support;Therapeutic Activities;Visual/perceptual remediation/compensation;UE/LE Coordination activities;Splinting/orthotics;Patient/family education;Pain management;Skin care/wound managment;UE/LE Strength taining/ROM;Therapeutic Exercise;Self Care/advanced ADL retraining;Neuromuscular re-education;Disease mangement/prevention;Wheelchair propulsion/positioning;Cognitive remediation/compensation  SLP Interventions Cognitive remediation/compensation;Cueing hierarchy;Dysphagia/aspiration precaution training;Environmental controls;Functional tasks;Internal/external aids;Multimodal communication approach;Oral motor exercises;Patient/family education;Speech/Language facilitation;Therapeutic Activities  TR Interventions    SW/CM Interventions      Team Discharge Planning: Destination: PT-Home (home vs snf) ,OT- Home , SLP- (TBD) Projected Follow-up: PT-Home health PT;Skilled nursing facility;24 hour supervision/assistance, OT-  Home health OT;24 hour supervision/assistance, SLP-Home Health SLP;Skilled Nursing facility Projected Equipment Needs: PT-Wheelchair (measurements);Wheelchair cushion (measurements);To be determined,  OT- 3 in 1 bedside comode;Tub/shower bench;To be determined, SLP-To be determined Equipment Details: PT- , OT-  Patient/family involved in discharge planning: PT- Patient unable/family or caregiver not available,  OT-Patient unable/family or caregiver not available, SLP-Patient unable/family or caregive not available  MD ELOS: 18-21day Medical Rehab Prognosis:  Guarded Assessment: 80 y.o. right-handed female with history of atrial fibrillation status post pacemaker with chronic Coumadin therapy, recent fall with pelvic fractures and had recently been at skilled nursing facility secondary to pelvic fractures.. Admitted 06/13/2049 with right-sided weakness as well as aphasia. INR on admission of 1.35. Initial cranial CT scan negative for acute changes. Patient did receive TPA. Followup cranial CT scan showed no 3.6 cm left basal ganglia parenchymal hemorrhage consistent with hemorrhagic transformation of acute infarct. 5 mm rightward midline shift. Echocardiogram with ejection fraction of 55% no wall motion abnormalities. Carotid Dopplers with no ICA stenosis.  Neurology services consulted with workup ongoing. Aspirin held for post TPA hemorrhage. Repeat cranial CT scan 06/19/2014 shows slight interval decrease in size of hematoma centered in the left putamen, measuring 30 x 28 mm. Midline shift of 6 mm not significantly changed  Now requiring 24/7 Rehab RN,MD, as well as CIR level PT, OT and SLP.  Treatment team will focus on ADLs and mobility with goals set at Mod A   See Team Conference Notes for weekly updates to the plan of care

## 2014-06-22 NOTE — Progress Notes (Signed)
Physical Therapy Session Note  Patient Details  Name: Christy Miranda MRN: [id number] Date of Birth: 01/28/1921  Today's Date: 06/22/2014 Time:1318-1417 Duration: 59 mins    Short Term Goals: Week 1:  PT Short Term Goal 1 (Week 1): Pt will roll R and L with min A with use of bedrails PT Short Term Goal 2 (Week 1): Pt will perform squat pivot transfer at max A level to the R or L PT Short Term Goal 3 (Week 1): Pt will perform dymamic sitting at mod A level PT Short Term Goal 4 (Week 1): Pt will perform sit<>stand at max A level PT Short Term Goal 5 (Week 1): Pt will self propel w/c using L hemi technique at max A level  Skilled Therapeutic Interventions/Progress Updates:   Pt received sitting in w/c in room, daughter Christy Miranda and sister Christy Miranda present during session.  PA, Dan in room to discuss newly discovered redness on R toe area.  Feel that area may have been "bumped" at some point or be ingrown toenail.  PT to monitor during session for expression of pain.  Had discussion with family about "goals of care" and family very concerned with pts speaking and "voice."  Discussed that this may take time and that they should speak with SLP regarding these matters.  Still would like to have better outlook on goals for mobility.  Daughter states she would like for pt to be walking with walker, however discussed that this is very unlikely, esp in time frame on rehab. Pt verbalized understanding.  Remainder of session focused on w/c seating and positioning with adequate support of kyphosis/scoliosis to accommodate better breathing, trunk control, swallowing, etc.  Also continue to focus on pressure relief.  Replaced pts 16x16 sling back chair with Acta Back cushion to best support spinal changes.  Placed cushion in position that pt will have deepest seat depth.  Also worked on adjusting Roho high profile cushion for better envelopment and immersion based on lowest point of body prominence (ischial tuberosities).   Will need to conform metal stays in cushion to better support pts spinal deformity.  Overall, pt in better position and seems to be comfortable.  Will continue to monitor pts positioning and check roho cushion daily.  Pts family pleased with new seating.  Pt assisted back to room and left in w/c with all needs in reach.    Therapy Documentation Precautions:  Precautions Precautions: Fall Precaution Comments: Rt sided hemiparesis, aphasia, severe kyphosis/scoliosis, recent pelvic fx, right neglect Restrictions Weight Bearing Restrictions: No LLE Weight Bearing: Weight bearing as tolerated   Vital Signs: Therapy Vitals Temp: 99.3 F (37.4 C) Temp src: Oral Pulse Rate: 89 Resp: 18 BP: 128/64 mmHg Patient Position (if appropriate): Lying Oxygen Therapy SpO2: 96 % O2 Device: None (Room air) Pain: Pt with no noted pain during session.   See FIM for current functional status  Therapy/Group: Individual Therapy  Denice Bors 06/22/2014, 9:50 PM

## 2014-06-22 NOTE — Progress Notes (Signed)
80 y.o. right-handed female with history of atrial fibrillation status post pacemaker with chronic Coumadin therapy, recent fall with pelvic fractures and had recently been at skilled nursing facility secondary to pelvic fractures.. Admitted 06/13/2049 with right-sided weakness as well as aphasia. INR on admission of 1.35. Initial cranial CT scan negative for acute changes. Patient did receive TPA. Followup cranial CT scan showed no 3.6 cm left basal ganglia parenchymal hemorrhage consistent with hemorrhagic transformation of acute infarct. 5 mm rightward midline shift. Echocardiogram with ejection fraction of 55% no wall motion abnormalities. Carotid Dopplers with no ICA stenosis. Neurology services consulted with workup ongoing. Aspirin held for post TPA hemorrhage. Repeat cranial CT scan 06/19/2014 shows slight interval decrease in size of hematoma centered in the left putamen, measuring 30 x 28 mm  Subjective/Complaints:  Pt aphasic unable to verbalize complaints Low grade temp improved Objective: Vital Signs: Blood pressure 138/65, pulse 68, temperature 98.3 F (36.8 C), temperature source Oral, resp. rate 18, height 5' (1.524 m), weight 50.984 kg (112 lb 6.4 oz), SpO2 96.00%. No results found. Results for orders placed during the hospital encounter of 06/19/14 (from the past 72 hour(s))  CBC WITH DIFFERENTIAL     Status: Abnormal   Collection Time    06/20/14  4:30 AM      Result Value Ref Range   WBC 3.5 (*) 4.0 - 10.5 K/uL   RBC 3.38 (*) 3.87 - 5.11 MIL/uL   Hemoglobin 10.6 (*) 12.0 - 15.0 g/dL   HCT 32.4 (*) 36.0 - 46.0 %   MCV 95.9  78.0 - 100.0 fL   MCH 31.4  26.0 - 34.0 pg   MCHC 32.7  30.0 - 36.0 g/dL   RDW 16.3 (*) 11.5 - 15.5 %   Platelets 103 (*) 150 - 400 K/uL   Comment: CONSISTENT WITH PREVIOUS RESULT   Neutrophils Relative % 71  43 - 77 %   Neutro Abs 2.5  1.7 - 7.7 K/uL   Lymphocytes Relative 20  12 - 46 %   Lymphs Abs 0.7  0.7 - 4.0 K/uL   Monocytes Relative 8  3 -  12 %   Monocytes Absolute 0.3  0.1 - 1.0 K/uL   Eosinophils Relative 1  0 - 5 %   Eosinophils Absolute 0.1  0.0 - 0.7 K/uL   Basophils Relative 0  0 - 1 %   Basophils Absolute 0.0  0.0 - 0.1 K/uL  COMPREHENSIVE METABOLIC PANEL     Status: Abnormal   Collection Time    06/20/14  4:30 AM      Result Value Ref Range   Sodium 138  137 - 147 mEq/L   Potassium 3.6 (*) 3.7 - 5.3 mEq/L   Chloride 102  96 - 112 mEq/L   CO2 25  19 - 32 mEq/L   Glucose, Bld 92  70 - 99 mg/dL   BUN 12  6 - 23 mg/dL   Creatinine, Ser 0.37 (*) 0.50 - 1.10 mg/dL   Calcium 8.2 (*) 8.4 - 10.5 mg/dL   Total Protein 5.6 (*) 6.0 - 8.3 g/dL   Albumin 2.6 (*) 3.5 - 5.2 g/dL   AST 17  0 - 37 U/L   ALT 10  0 - 35 U/L   Alkaline Phosphatase 79  39 - 117 U/L   Total Bilirubin 0.6  0.3 - 1.2 mg/dL   GFR calc non Af Amer >90  >90 mL/min   GFR calc Af Amer >90  >90 mL/min  Comment: (NOTE)     The eGFR has been calculated using the CKD EPI equation.     This calculation has not been validated in all clinical situations.     eGFR's persistently <90 mL/min signify possible Chronic Kidney     Disease.   Anion gap 11  5 - 15      Constitutional:  80 year old frail white female. Head forward posture and leaning to the right. Eyes:  Pupils reactive to light  Neck: Normal range of motion. Neck supple. No thyromegaly present.  Cardiovascular:  Irreg Irreg. No murmur  Respiratory: Effort normal and breath sounds normal. No respiratory distress.  GI: Soft. Bowel sounds are normal. She exhibits no distension. Non-tender Neurological:  Patient is alert. Left gaze preference. She was expressively and receptively aphasic with apraxia. She did follow a few simple commands. Made eye contact when cued.RUE: 0/5 deltoid, bicep, tricep, 1/5 finger flex. RLE: 3- HF, KE, and 2- ADF/PF but inconsistent. Sensation diminished but senses pinch right arm/leg. Increased tone R biceps MAS1 Skin: Skin is warm and dry  Psych: flat, calm,  collected Ext- ecchymosis R achilles area  Assessment/Plan: 1. Functional deficits secondary to  left basal ganglia parenchymal hemorrhage consistent with hemorrhagic transformation of acute infarct which require 3+ hours per day of interdisciplinary therapy in a comprehensive inpatient rehab setting. Physiatrist is providing close team supervision and 24 hour management of active medical problems listed below. Physiatrist and rehab team continue to assess barriers to discharge/monitor patient progress toward functional and medical goals. Team conference today please see physician documentation under team conference tab, met with team face-to-face to discuss problems,progress, and goals. Formulized individual treatment plan based on medical history, underlying problem and comorbidities. FIM: FIM - Bathing Bathing Steps Patient Completed: Chest;Right Arm;Abdomen;Right upper leg;Left upper leg Bathing: 3: Mod-Patient completes 5-7 [id number] parts or 50-74%  FIM - Upper Body Dressing/Undressing Upper body dressing/undressing steps patient completed: Put head through opening of pull over shirt/dress Upper body dressing/undressing: 1: Total-Patient completed less than 25% of tasks FIM - Lower Body Dressing/Undressing Lower body dressing/undressing: 1: Two helpers        FIM - BControl and instrumentation engineerDevices: Arm rests Bed/Chair Transfer: 1: Sit > Supine: Total A (helper does all/Pt. < 25%);2: Chair or W/C > Bed: Max A (lift and lower assist)  FIM - Locomotion: Wheelchair Locomotion: Wheelchair: 0: Activity did not occur FIM - Locomotion: Ambulation Locomotion: Ambulation Assistive Devices: Parallel bars Ambulation/Gait Assistance: 1: +2 Total assist Locomotion: Ambulation: 1: Two helpers (for w/c follow)  Comprehension Comprehension Mode: Auditory Comprehension: 2-Understands basic 25 - 49% of the time/requires cueing 51 - 75% of the time  Expression Expression  Mode: Verbal Expression: 1-Expresses basis less than 25% of the time/requires cueing greater than 75% of the time.  Social Interaction Social Interaction: 2-Interacts appropriately 25 - 49% of time - Needs frequent redirection.  Problem Solving Problem Solving: 1-Solves basic less than 25% of the time - needs direction nearly all the time or does not effectively solve problems and may need a restraint for safety  Memory Memory: 1-Recognizes or recalls less than 25% of the time/requires cueing greater than 75% of the time  Medical Problem List and Plan:  1. 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 No aspirin at this time repeat CT scan in one week to decide upon initiation of aspirin  2. DVT Prophylaxis/Anticoagulation: SCDs. Monitor for any signs of DVT  3.  Pain Management: Tylenol as needed  4. Mood/anxiety. Xanax 0.25 mg 3 times a day as needed. Provide emotional support:  5. Neuropsych: This patient is not capable of making decisions on her own behalf.   6. Dysphagia. Dysphagia 1 with honey thick liquids. Continue IV fluids for hydration. Followup chemistries  7. History of atrial fibrillation/pacemaker with chronic Coumadin. Coumadin held secondary to hemorrhagic transformation x 3-4 wk.  To f/u with Neuro post D/C to determine restart date on warfarin Cardiac rate controlled. Continue Toprol 50 mg twice a day for rate control  8. Recent pelvic fracture. Weightbearing as tolerated. No pain with LE ROM 9. Hypothyroidism. Synthroid 10.  urinary incont, check UA  LOS (Days) 3 A FACE TO FACE EVALUATION WAS PERFORMED  Davarius Ridener E 06/22/2014, 7:44 AM

## 2014-06-22 NOTE — Progress Notes (Signed)
Social Work Assessment and Plan  Patient Details  Name: Christy Miranda MRN: [id number] Date of Birth: [birth year]  Today's Date: 06/22/2014  Problem List:  Patient Active Problem List   Diagnosis Date Noted  . CVA (cerebral infarction) 06/19/2014  . Undernutrition 06/19/2014  . Fracture of multiple pubic rami 05/22/2014  . Encounter for therapeutic drug monitoring 01/02/2014  . Dizziness and giddiness 07/22/2013  . Thrombocytopenia 11/16/2012  . Deep venous thrombosis of lower leg 11/13/2012  . Subtherapeutic international normalized ratio (INR) 11/13/2012  . Pancytopenia 11/13/2012  . Edema 11/07/2012  . Pacemaker-Medtronic 08/19/2012  . Current use of long term anticoagulation 08/08/2011  . Atrial fibrillation 02/28/2011  . Tachycardia-bradycardia syndrome 02/28/2011  . Hypertension 02/28/2011   Past Medical History:  Past Medical History  Diagnosis Date  . HTN (hypertension)   . Permanent atrial fibrillation   . Bradycardia     s/p PPM  . Hypothyroidism   . Anxiety   . Diverticulitis     history of  . History of hysterectomy   . Long-term (current) use of anticoagulants   . Scoliosis (and kyphoscoliosis), idiopathic   . DVT (deep vein thrombosis) in pregnancy     right peroneal  . Dizziness and giddiness 07/22/2013   Past Surgical History:  Past Surgical History  Procedure Laterality Date  . Pacemaker insertion  03/03/2006    most recent generator (MDT) change 10/30/06 by Dr Verlon Setting  . Cardiac catheterization  01/08/1999    normal LV function  . Cardioversion  10/30/2006    Successful elective DC cardioversion  . Cardioversion  12/29/2002    Successful DC cardioversion  . Cardioversion  12/03/1999    Successful DC cardioversion  . Cholecystectomy     Social History:  reports that she has never smoked. She has never used smokeless tobacco. She reports that she does not drink alcohol or use illicit drugs.  Family / Support Systems Marital Status:  Divorced How Long?: about 9 years Patient Roles: Parent Children: Alger Simons - dtr- [phone] (h) [phone] (m) Other Supports: Franki Monte -grandson- [phone] Anticipated Caregiver: dtr Bethena Roys and they plan to hire caregivers when Bethena Roys has to work Ability/Limitations of Caregiver: dtr works part time as a Radio broadcast assistant, works 3 days/week. The plan is to hire caregivers for home if needed or to consider SNF, if needed. Caregiver Availability: Intermittent Family Dynamics: Dtr and pt are close and she is pt's only child.  Yolanda Bonine is supportive and has been helping with the house/yard.  Social History Preferred language: English Religion: Presbyterian Read: Yes Write: Yes Employment Status: Retired Public relations account executive Issues: None reported Guardian/Conservator: Pt's dtr is on all of pt's accounts.  Pt does not have DPOA or POA designated.   Abuse/Neglect Physical Abuse: Denies Verbal Abuse: Denies Sexual Abuse: Denies Exploitation of patient/patient's resources: Denies Self-Neglect: Denies  Emotional Status Pt's affect, behavior and adjustment status: It is difficult to assess due to pt's receptive and expressive deficits.  Pt works hard with therapists and gives good eye contact and is trying to communicate.  Pt's dtr is visiting daily and she states they are just taking things a day at a time. Recent Psychosocial Issues: None reported Psychiatric History: None reported Substance Abuse History: None reported  Patient / Family Perceptions, Expectations & Goals Pt/Family understanding of illness & functional limitations: Pt's dtr has a good understanding of pt's current condition and limitations.  She needs more clarification/information on pt's speech/language deficits. Premorbid pt/family roles/activities: Prior to pelvic fx, pt would  read at home and watch TV.  She enjoys the puzzles in the newspaper. Anticipated changes in roles/activities/participation: Pt was not  cleaning her home or doing yardwork prior to hospitalization, so she plans to resume her other activities as she is able. Pt/family expectations/goals: Pt couldn't express her goal, but dtr would like for pt's communication skills to improve so that she will know if pt is in pain or has a need, etc. She would also like to see her mobility improve.  Community Resources [employer]s: None Premorbid Home Care/DME Agencies: None Transportation available at discharge: pt's dtr Resource referrals recommended: Neuropsychology;Support group (specify) (when pt is able to participate with these)  Discharge Planning Living Arrangements: Alone Support Systems: Children;Friends/neighbors Type of Residence: Private residence Insurance Resources: [place] (specify) Nurse, mental health) Financial Resources: [place] Referred: No Living Expenses: Own Money Management: Patient;Family (dtr assists and is on pt's accts) Does the patient have any problems obtaining your medications?: No Home Management: Pt has cleaning help and outside of the house help.  Dtr can help with bill payments and meals,etc. Patient/Family Preliminary Plans: Dtr is leaning toward hiring caregivers for pt in pt's home on the days that she works.  She is not ruling out the possibility of SNF, though, if needed. Barriers to Discharge: Steps Social Work Anticipated Follow Up Needs: HH/OP;SNF Expected length of stay: 2 weeks, depending on pt's progress, activity tolerance, and functional gains  Clinical Impression CSW met with pt to introduce self and role of CSW, but the visit was brief due to pt's difficulty with communication.  CSW called pt's dtr, Bethena Roys, to do the same and complete assessment.  Dtr is an only child, so everything pt needs falls to her.  She stated that pt was fairly independent prior to fall and pelvic fx, with assistance with housecleaning and outside yardwork.  Dtr is thinking of  hiring caregivers in pt's home and CSW left her a private duty list in pt's room.  Dtr is open to SNF transfer if pt is going to require more care than can provided at home.  CSW will continue to follow and assist with pt's d/c plan.  Aavya Shafer, [name] 06/22/2014, 10:02 AM

## 2014-06-22 NOTE — Progress Notes (Signed)
Social Work Patient ID: Christy Miranda, female   DOB: 19-May-1921, 80 y.o.   MRN: [id number]  CSW met with pt in her room and called her dtr to update them on team conference discussion.  At this time, pt's d/c date has been set for 07-03-14, but explained to her that this will be re-evaluated ongoing depending on pt's progress.  Pt's fluid intake and food intake will be a main focus and dtr agree with this.  Dtr is still considering pt being at home with caregivers as pt's preferred d/c plan and CSW explained the caregivers would be expected to provide at least moderate assistance to pt.  She understood and will still entertain SNF option if that is better for pt.  CSW will continue to follow and assist as needed.

## 2014-06-23 ENCOUNTER — Ambulatory Visit (HOSPITAL_COMMUNITY): Payer: Medicare Other | Admitting: Speech Pathology

## 2014-06-23 ENCOUNTER — Encounter (HOSPITAL_COMMUNITY): Payer: Medicare Other | Admitting: Occupational Therapy

## 2014-06-23 ENCOUNTER — Inpatient Hospital Stay (HOSPITAL_COMMUNITY): Payer: Medicare Other | Admitting: Speech Pathology

## 2014-06-23 ENCOUNTER — Inpatient Hospital Stay (HOSPITAL_COMMUNITY): Payer: Medicare Other

## 2014-06-23 DIAGNOSIS — I6330 Cerebral infarction due to thrombosis of unspecified cerebral artery: Secondary | ICD-10-CM

## 2014-06-23 DIAGNOSIS — Z5189 Encounter for other specified aftercare: Secondary | ICD-10-CM

## 2014-06-23 DIAGNOSIS — I4891 Unspecified atrial fibrillation: Secondary | ICD-10-CM

## 2014-06-23 DIAGNOSIS — I10 Essential (primary) hypertension: Secondary | ICD-10-CM

## 2014-06-23 LAB — URINALYSIS, ROUTINE W REFLEX MICROSCOPIC
Bilirubin Urine: NEGATIVE
GLUCOSE, UA: NEGATIVE mg/dL
Hgb urine dipstick: NEGATIVE
KETONES UR: NEGATIVE mg/dL
LEUKOCYTES UA: NEGATIVE
Nitrite: NEGATIVE
PH: 6.5 (ref 5.0–8.0)
Protein, ur: NEGATIVE mg/dL
Specific Gravity, Urine: 1.023 (ref 1.005–1.030)
Urobilinogen, UA: 2 mg/dL — ABNORMAL HIGH (ref 0.0–1.0)

## 2014-06-23 NOTE — Progress Notes (Signed)
Occupational Therapy Session Note  Patient Details  Name: Christy Miranda MRN: [id number] Date of Birth: February 19, 1921  Today's Date: 06/23/2014 Time: 7001-7494 Time Calculation (min): 58 min  Short Term Goals: Week 1:  OT Short Term Goal 1 (Week 1): Pt will initiate and complete UB bathing with no more than min assist and min instructional cueing in supported sitting. OT Short Term Goal 2 (Week 1): Pt will perform UB dressing with min assist in supported sitting. OT Short Term Goal 3 (Week 1): Pt will perform LB bathing with max assist sit to stand. OT Short Term Goal 4 (Week 1): Pt will maintain static sitting EOB with no more than min assist for 5 mins in preparation for selfcare tasks. OT Short Term Goal 5 (Week 1): Pt will use the RUE as a stabilizer with mod assist and mod instructional cueing to integrate into selfcare tasks.   Skilled Therapeutic Interventions/Progress Updates:    Pt performed transfer supine to sit with total assist and max demonstrational cueing for sequencing bending up the knee and rolling.  Transferred to the wheelchair with max assist squat pivot to the left.  She worked on bathing sitting supported at the sink.  Mod instructional cueing for initiation and performing all bathing.  Pt required max assist to perform sit to stand for washing her peri area.  Pt exhibited bladder incontinence in standing when she was washing in standing.  Severe kyphosis and cervical flexion in standing and sitting.  Pt needs max demonstrational cueing to maintain neutral sitting and not lean to the left.  She also maintains right cervical lateral flexion.  Pt was able to thread her left shirt sleeve and pull it over her head but needed max assist to initiate donning the right side.  She also assisted with donning pants on both legs and socks as well but needed total assist for completion.  Max assist for integration of the RUE during bathing and dressing tasks.  Pt still with significant motor  planning deficits with attempted squeezing of the washcloth.    Therapy Documentation Precautions:  Precautions Precautions: Fall Precaution Comments: Rt sided hemiparesis, aphasia, severe kyphosis/scoliosis, recent pelvic fx, right neglect Restrictions Weight Bearing Restrictions: No LLE Weight Bearing: Weight bearing as tolerated   Pain: Pain Assessment Pain Assessment: Faces Pain Score: 2  Faces Pain Scale: No hurt Pain Type: Acute pain Pain Location: Back Pain Orientation: Mid;Lower Pain Descriptors / Indicators: Discomfort Pain Frequency: Intermittent Pain Onset: Gradual Patients Stated Pain Goal: 2 Pain Intervention(s): Medication (See eMAR) ADL: See FIM for current functional status  Therapy/Group: Individual Therapy  Dervin Vore OTR/L 06/23/2014, 12:26 PM

## 2014-06-23 NOTE — Progress Notes (Signed)
Speech Language Pathology Daily Session Note  Patient Details  Name: Christy Miranda MRN: [id number] Date of Birth: [birth year]  Today's Date: 06/23/2014 Time: 0830-0930, 1216-2446 Time Calculation (min): 60 min, 35 min  Short Term Goals: Week 1: SLP Short Term Goal 1 (Week 1): Patient will consume Dys. 1 textures and honey-thick liquids via teaspoon with Min cues for pacing and portion control  to minimize overt s/s of aspiration. SLP Short Term Goal 2 (Week 1): Patient will participate in automatic verbal tasks wtih Max verbal and written cues. SLP Short Term Goal 3 (Week 1): Patient will identify named object from a field of 2 with Max multimodal cues. SLP Short Term Goal 4 (Week 1): Patient will solve basic problems related to self care with Mod multiodal cues.  SLP Short Term Goal 5 (Week 1): Patient will sustain attention to basic self-care tasks for 2 minutes with Mod cues for redirection.  Skilled Therapeutic Interventions: Session 1 Skilled treatment session focused on addressing dysphagia and communication goals. SLP facilitated session with set-up and Min verbal and visual cues for pacing of honey-thick liquids via teaspoon.  Patient demonstrated no overt s/s of aspiration during session.  SLP also facilitated session with Max verbal, visual, tactile and written cues for verbal expression of biographical information with approximations.  Patient identified object from a field of three with 20% accuracy and Max multimodal cues.    Session 2    Skilled treatment session focused on addressing receptive and expressive language goals.  Daughter present for session to observe and asked appropriate questions regarding current impairments.  SLP facilitated session with Max multimodal and written cues for naming functional objects.  Patient identified object from a field of two with 70% accuracy and Max multimodal cues.    FIM:  Comprehension Comprehension Mode: Auditory Comprehension:  2-Understands basic 25 - 49% of the time/requires cueing 51 - 75% of the time Expression Expression Mode: Verbal Expression: 1-Expresses basis less than 25% of the time/requires cueing greater than 75% of the time. Social Interaction Social Interaction: 2-Interacts appropriately 25 - 49% of time - Needs frequent redirection. Problem Solving Problem Solving: 1-Solves basic less than 25% of the time - needs direction nearly all the time or does not effectively solve problems and may need a restraint for safety Memory Memory: 1-Recognizes or recalls less than 25% of the time/requires cueing greater than 75% of the time FIM - Eating Eating Activity: 5: Needs verbal cues/supervision;4: Helper occasionally scoops food on utensil;4: Help with picking up utensils  Pain  Pain Assessment Pain Assessment: Faces Pain Score: 2  Faces Pain Scale: Hurts even more Pain Type: Acute pain Pain Location: Back Pain Orientation: Mid;Lower Pain Descriptors / Indicators: Discomfort Pain Frequency: Intermittent Pain Onset: Gradual Patients Stated Pain Goal: 2 Pain Intervention(s): Medication (See eMAR) Session 1 RN made aware and provided medication  Session 2  Pt repositioned   Therapy/Group: Individual Therapy x2  Carmelia Roller., [place] [phone]  [place] 06/23/2014, 12:23 PM

## 2014-06-23 NOTE — Progress Notes (Signed)
80 y.o. right-handed female with history of atrial fibrillation status post pacemaker with chronic Coumadin therapy, recent fall with pelvic fractures and had recently been at skilled nursing facility secondary to pelvic fractures.. Admitted 06/13/2049 with right-sided weakness as well as aphasia. INR on admission of 1.35. Initial cranial CT scan negative for acute changes. Patient did receive TPA. Followup cranial CT scan showed no 3.6 cm left basal ganglia parenchymal hemorrhage consistent with hemorrhagic transformation of acute infarct. 5 mm rightward midline shift. Echocardiogram with ejection fraction of 55% no wall motion abnormalities. Carotid Dopplers with no ICA stenosis. Neurology services consulted with workup ongoing. Aspirin held for post TPA hemorrhage. Repeat cranial CT scan 06/19/2014 shows slight interval decrease in size of hematoma centered in the left putamen, measuring 30 x 28 mm  Subjective/Complaints:  Pt aphasic unable to verbalize complaints Low grade temp UA not sent yet Objective: Vital Signs: Blood pressure 145/73, pulse 79, temperature 99.1 F (37.3 C), temperature source Oral, resp. rate 17, height 5' (1.524 m), weight 50.984 kg (112 lb 6.4 oz), SpO2 93.00%. No results found. No results found for this or any previous visit (from the past 72 hour(s)).    Constitutional:  80 year old frail white female. Head forward posture and leaning to the right. Eyes:  Pupils reactive to light  Neck: Normal range of motion. Neck supple. No thyromegaly present.  Cardiovascular:  Irreg Irreg. No murmur  Respiratory: Effort normal and breath sounds normal. No respiratory distress.  GI: Soft. Bowel sounds are normal. She exhibits no distension. Non-tender Neurological:  Patient is alert. Left gaze preference. She was expressively and receptively aphasic with apraxia. She did follow a few simple commands. Made eye contact when cued.RUE: 2-/5 deltoid, bicep, tricep, 3-/5 finger flex.  RLE: 3- HF, KE, and 2- ADF/PF but inconsistent. Sensation diminished but senses pinch right arm/leg. Increased tone R biceps MAS1 Skin: Skin is warm and dry  Psych: flat, calm, collected Ext- ecchymosis R achilles area  Assessment/Plan: 1. Functional deficits secondary to  left basal ganglia parenchymal hemorrhage consistent with hemorrhagic transformation of acute infarct which require 3+ hours per day of interdisciplinary therapy in a comprehensive inpatient rehab setting. Physiatrist is providing close team supervision and 24 hour management of active medical problems listed below. Physiatrist and rehab team continue to assess barriers to discharge/monitor patient progress toward functional and medical goals.  FIM: FIM - Bathing Bathing Steps Patient Completed: Chest;Right Arm;Abdomen;Right upper leg;Left upper leg Bathing: 3: Mod-Patient completes 5-7 80f 10 parts or 50-74%  FIM - Upper Body Dressing/Undressing Upper body dressing/undressing steps patient completed: Put head through opening of pull over shirt/dress Upper body dressing/undressing: 2: Max-Patient completed 25-49% of tasks FIM - Lower Body Dressing/Undressing Lower body dressing/undressing: 1: Total-Patient completed less than 25% of tasks        FIM - Engineer, site Assistive Devices: HOB elevated;Bed rails;Arm rests Bed/Chair Transfer: 1: Supine > Sit: Total A (helper does all/Pt. < 25%);1: Bed > Chair or W/C: Total A (helper does all/Pt. < 25%)  FIM - Locomotion: Wheelchair Locomotion: Wheelchair: 0: Activity did not occur FIM - Locomotion: Ambulation Locomotion: Ambulation Assistive Devices: Parallel bars Ambulation/Gait Assistance: 1: +2 Total assist Locomotion: Ambulation: 1: Two helpers (for w/c follow)  Comprehension Comprehension Mode: Auditory Comprehension: 3-Understands basic 50 - 74% of the time/requires cueing 25 - 50%  of the time  Expression Expression Mode:  Verbal Expression: 1-Expresses basis less than 25% of the time/requires cueing greater than 75% of the  time.  Social Interaction Social Interaction: 2-Interacts appropriately 25 - 49% of time - Needs frequent redirection.  Problem Solving Problem Solving: 2-Solves basic 25 - 49% of the time - needs direction more than half the time to initiate, plan or complete simple activities  Memory Memory: 2-Recognizes or recalls 25 - 49% of the time/requires cueing 51 - 75% of the time  Medical Problem List and Plan:  1. 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 No aspirin at this time repeat CT scan in one week to decide upon initiation of aspirin  2. DVT Prophylaxis/Anticoagulation: SCDs. Monitor for any signs of DVT  3. Pain Management: Tylenol as needed  4. Mood/anxiety. Xanax 0.25 mg 3 times a day as needed. Provide emotional support:  5. Neuropsych: This patient is not capable of making decisions on her own behalf.   6. Dysphagia. Dysphagia 1 with honey thick liquids. Continue IV fluids for hydration. Followup chemistries  7. History of atrial fibrillation/pacemaker with chronic Coumadin. Coumadin held secondary to hemorrhagic transformation x 3-4 wk.  To f/u with Neuro post D/C to determine restart date on warfarin Cardiac rate controlled. Continue Toprol 50 mg twice a day for rate control  8. Recent pelvic fracture. Weightbearing as tolerated. No pain with LE ROM 9. Hypothyroidism. Synthroid 10.  urinary incont, check UA  LOS (Days) 4 A FACE TO FACE EVALUATION WAS PERFORMED  Christy Miranda 06/23/2014, 7:27 AM

## 2014-06-23 NOTE — Progress Notes (Signed)
Physical Therapy Session Note  Patient Details  Name: Christy Miranda MRN: [id number] Date of Birth: [birth year]  Today's Date: 06/23/2014 Time: 0400-0500 Time Calculation (min): 60 min  Short Term Goals: Week 1:  PT Short Term Goal 1 (Week 1): Pt will roll R and L with min A with use of bedrails PT Short Term Goal 2 (Week 1): Pt will perform squat pivot transfer at max A level to the R or L PT Short Term Goal 3 (Week 1): Pt will perform dymamic sitting at mod A level PT Short Term Goal 4 (Week 1): Pt will perform sit<>stand at max A level PT Short Term Goal 5 (Week 1): Pt will self propel w/c using L hemi technique at max A level  Skilled Therapeutic Interventions/Progress Updates:    Pt received asleep in bed w/ sister present. Pt required mod tactile and verbal cues for arousal. Session focused on functional mobility, w/c fitting, and seated balance. Pt demonstrated significant deficits in motor initiation and planning, with TotalA to move sit <> supine several times throughout session. However, once therapist provided initiation and motor feedback, pt demonstrated ability several times to complete motor pattern. For bed <> w/c transfer, therapist provided MaxA to come to standing and pt able to shuffle BLE to perform pivot transfer w/ ModA. Therapist inflated Roho cushion prior to transfer and then deflated with pt sitting on cushion in order to ensure effective pressure relief.  While seated in chair, pt performed reaching tasks requiring visual scanning, LUE coordination, and trunk control to address visual deficits and dynamic seated balance. During session, pt benefited from positioning chair so that sister was in R visual field, pt increased scanning and R cervical rotation during this time. Pt may benefit from tilt in space chair depending on cognition and aphasia clearing, unclear if pt understands importance of pressure relief. Discussed discharge planning w/ pt's sister, plans remain  unclear at this time. Pt w/ Mod/MaxA SPT w/c>bed, left supine in bed w/ sister present w/ all needs within reach.  Therapy Documentation Precautions:  Precautions Precautions: Fall Precaution Comments: Rt sided hemiparesis, aphasia, severe kyphosis/scoliosis, recent pelvic fx, right neglect Restrictions Weight Bearing Restrictions: No LLE Weight Bearing: Weight bearing as tolerated General:   Vital Signs: Therapy Vitals Temp: 98 F (36.7 C) Temp src: Oral Pulse Rate: 72 Resp: 16 BP: 159/75 mmHg Patient Position (if appropriate): Lying Oxygen Therapy SpO2: 95 % O2 Device: None (Room air) Pain: Pain Assessment Pain Assessment: Faces Faces Pain Scale: No hurt Mobility:   Locomotion :    Trunk/Postural Assessment :    Balance:   Exercises:   Other Treatments:    See FIM for current functional status  Therapy/Group: Individual Therapy  Rada Hay Rada Hay, PT, DPT 06/23/2014, 5:24 PM

## 2014-06-24 ENCOUNTER — Inpatient Hospital Stay (HOSPITAL_COMMUNITY): Payer: Medicare Other | Admitting: Speech Pathology

## 2014-06-24 ENCOUNTER — Inpatient Hospital Stay (HOSPITAL_COMMUNITY): Payer: Medicare Other | Admitting: Occupational Therapy

## 2014-06-24 ENCOUNTER — Inpatient Hospital Stay (HOSPITAL_COMMUNITY): Payer: Medicare Other | Admitting: Physical Therapy

## 2014-06-24 LAB — URINE CULTURE
CULTURE: NO GROWTH
Colony Count: NO GROWTH

## 2014-06-24 MED ORDER — METOPROLOL TARTRATE 50 MG PO TABS
50.0000 mg | ORAL_TABLET | Freq: Two times a day (BID) | ORAL | Status: DC
  Administered 2014-06-24 – 2014-07-06 (×24): 50 mg via ORAL
  Filled 2014-06-24 (×27): qty 1

## 2014-06-24 MED ORDER — METOPROLOL TARTRATE 25 MG/10 ML ORAL SUSPENSION: 25.0000 mg | Freq: Two times a day (BID) | ORAL | Status: DC

## 2014-06-24 NOTE — Progress Notes (Signed)
Speech Language Pathology Daily Session Note  Patient Details  Name: Christy Miranda MRN: [id number] Date of Birth: [birth year]  Today's Date: 06/24/2014 Time: 6606-0045 Time Calculation (min): 45 min  Short Term Goals: Week 1: SLP Short Term Goal 1 (Week 1): Patient will consume Dys. 1 textures and honey-thick liquids via teaspoon with Min cues for pacing and portion control  to minimize overt s/s of aspiration. SLP Short Term Goal 2 (Week 1): Patient will participate in automatic verbal tasks wtih Max verbal and written cues. SLP Short Term Goal 3 (Week 1): Patient will identify named object from a field of 2 with Max multimodal cues. SLP Short Term Goal 4 (Week 1): Patient will solve basic problems related to self care with Mod multiodal cues.  SLP Short Term Goal 5 (Week 1): Patient will sustain attention to basic self-care tasks for 2 minutes with Mod cues for redirection.  Skilled Therapeutic Interventions: Skilled ST intervention provided with focus on dysphagia and exp/rec lang goals. Slp assisted pt with repositioning in bed to upright 90 degrees. Slp provided assist with meal set up and occasional feeding. Pt required min-mod verbal cues to utilize appropriate swallow strategies and slow rate. No overt s/s of aspiration noted with puree/HTL. Pt able to clear residue with liquid wash and verbal cuing. Pt was 20% accurate with simple 1-step commands and ID objects out of field of 2 with 40% accuracy, min A. Pt noted with decreased expressive communication, counting 1-5 independently, requiring max verbal cues for 6-10.    FIM:  Comprehension Comprehension Mode: Auditory Comprehension: 2-Understands basic 25 - 49% of the time/requires cueing 51 - 75% of the time Expression Expression Mode: Verbal Expression: 1-Expresses basis less than 25% of the time/requires cueing greater than 75% of the time. Social Interaction Social Interaction: 2-Interacts appropriately 25 - 49% of time - Needs  frequent redirection. Problem Solving Problem Solving: 1-Solves basic less than 25% of the time - needs direction nearly all the time or does not effectively solve problems and may need a restraint for safety Memory Memory: 1-Recognizes or recalls less than 25% of the time/requires cueing greater than 75% of the time FIM - Eating Eating Activity: 5: Needs verbal cues/supervision;4: Helper occasionally scoops food on utensil  Pain Pain Assessment Pain Assessment: No/denies pain  Therapy/Group: Individual Therapy  Jabron Weese, Bernerd Pho 06/24/2014, 10:37 AM

## 2014-06-24 NOTE — Plan of Care (Signed)
Problem: RH PAIN MANAGEMENT Goal: RH STG PAIN MANAGED AT OR BELOW PT'S PAIN GOAL Less than 2  Outcome: Progressing No c/o pain

## 2014-06-24 NOTE — Progress Notes (Signed)
80 y.o. right-handed female with history of atrial fibrillation status post pacemaker with chronic Coumadin therapy, recent fall with pelvic fractures and had recently been at skilled nursing facility secondary to pelvic fractures.. Admitted 06/13/2049 with right-sided weakness as well as aphasia. INR on admission of 1.35. Initial cranial CT scan negative for acute changes. Patient did receive TPA. Followup cranial CT scan showed no 3.6 cm left basal ganglia parenchymal hemorrhage consistent with hemorrhagic transformation of acute infarct. 5 mm rightward midline shift. Echocardiogram with ejection fraction of 55% no wall motion abnormalities. Carotid Dopplers with no ICA stenosis. Neurology services consulted with workup ongoing. Aspirin held for post TPA hemorrhage. Repeat cranial CT scan 06/19/2014 shows slight interval decrease in size of hematoma centered in the left putamen, measuring 30 x 28 mm  Subjective/Complaints:  Pt aphasic unable to verbalize complaints Temp nl this am Objective: Vital Signs: Blood pressure 159/78, pulse 72, temperature 98.5 F (36.9 C), temperature source Oral, resp. rate 17, height 5' (1.524 m), weight 50.984 kg (112 lb 6.4 oz), SpO2 97.00%. No results found. Results for orders placed during the hospital encounter of 06/19/14 (from the past 72 hour(s))  URINALYSIS, ROUTINE W REFLEX MICROSCOPIC     Status: Abnormal   Collection Time    06/23/14  2:25 PM      Result Value Ref Range   Color, Urine YELLOW  YELLOW   APPearance CLOUDY (*) CLEAR   Specific Gravity, Urine 1.023  1.005 - 1.030   pH 6.5  5.0 - 8.0   Glucose, UA NEGATIVE  NEGATIVE mg/dL   Hgb urine dipstick NEGATIVE  NEGATIVE   Bilirubin Urine NEGATIVE  NEGATIVE   Ketones, ur NEGATIVE  NEGATIVE mg/dL   Protein, ur NEGATIVE  NEGATIVE mg/dL   Urobilinogen, UA 2.0 (*) 0.0 - 1.0 mg/dL   Nitrite NEGATIVE  NEGATIVE   Leukocytes, UA NEGATIVE  NEGATIVE   Comment: MICROSCOPIC NOT DONE ON URINES WITH NEGATIVE  PROTEIN, BLOOD, LEUKOCYTES, NITRITE, OR GLUCOSE <1000 mg/dL.      Constitutional:  80 year old frail white female. Head forward posture and leaning to the right. Eyes:  Pupils reactive to light  Neck: Normal range of motion. Neck supple. No thyromegaly present.  Cardiovascular:  Irreg Irreg. No murmur  Respiratory: Effort normal and breath sounds normal. No respiratory distress.  GI: Soft. Bowel sounds are normal. She exhibits no distension. Non-tender Neurological:  Patient is alert. Left gaze preference. She was expressively and receptively aphasic with apraxia. She did follow a few simple commands. Made eye contact when cued.RUE: 2-/5 deltoid, bicep, tricep, 3-/5 finger flex. RLE: 3- HF, KE, and 2- ADF/PF but inconsistent. Sensation diminished but senses pinch right arm/leg. Increased tone R biceps MAS1 Skin: Skin is warm and dry  Psych: flat, calm, collected Ext- ecchymosis R achilles area  Assessment/Plan: 1. Functional deficits secondary to  left basal ganglia parenchymal hemorrhage consistent with hemorrhagic transformation of acute infarct which require 3+ hours per day of interdisciplinary therapy in a comprehensive inpatient rehab setting. Physiatrist is providing close team supervision and 24 hour management of active medical problems listed below. Physiatrist and rehab team continue to assess barriers to discharge/monitor patient progress toward functional and medical goals.  FIM: FIM - Bathing Bathing Steps Patient Completed: Chest;Right Arm;Abdomen;Right upper leg;Left upper leg Bathing: 3: Mod-Patient completes 5-7 54f 10 parts or 50-74%  FIM - Upper Body Dressing/Undressing Upper body dressing/undressing steps patient completed: Put head through opening of pull over shirt/dress;Thread/unthread left sleeve of pullover shirt/dress  Upper body dressing/undressing: 3: Mod-Patient completed 50-74% of tasks FIM - Lower Body Dressing/Undressing Lower body dressing/undressing  steps patient completed: Thread/unthread left pants leg Lower body dressing/undressing: 1: Total-Patient completed less than 25% of tasks        FIM - Engineer, site Assistive Devices: HOB elevated;Bed rails;Arm rests Bed/Chair Transfer: 1: Supine > Sit: Total A (helper does all/Pt. < 25%);2: Sit > Supine: Max A (lifting assist/Pt. 25-49%);2: Bed > Chair or W/C: Max A (lift and lower assist);2: Chair or W/C > Bed: Max A (lift and lower assist)  FIM - Locomotion: Wheelchair Locomotion: Wheelchair: 0: Activity did not occur FIM - Locomotion: Ambulation Locomotion: Ambulation Assistive Devices: Parallel bars Ambulation/Gait Assistance: 1: +2 Total assist Locomotion: Ambulation: 0: Activity did not occur  Comprehension Comprehension Mode: Auditory Comprehension: 2-Understands basic 25 - 49% of the time/requires cueing 51 - 75% of the time  Expression Expression Mode: Verbal Expression: 1-Expresses basis less than 25% of the time/requires cueing greater than 75% of the time.  Social Interaction Social Interaction: 2-Interacts appropriately 25 - 49% of time - Needs frequent redirection.  Problem Solving Problem Solving: 1-Solves basic less than 25% of the time - needs direction nearly all the time or does not effectively solve problems and may need a restraint for safety  Memory Memory: 1-Recognizes or recalls less than 25% of the time/requires cueing greater than 75% of the time  Medical Problem List and Plan:  1. 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 No aspirin at this time repeat CT scan in one week to decide upon initiation of aspirin  2. DVT Prophylaxis/Anticoagulation: SCDs. Monitor for any signs of DVT  3. Pain Management: Tylenol as needed  4. Mood/anxiety. Xanax 0.25 mg 3 times a day as needed. Provide emotional support:  5. Neuropsych: This patient is not capable of making decisions on her own  behalf.   6. Dysphagia. Dysphagia 1 with honey thick liquids. Continue IV fluids for hydration. Followup chemistries  7. History of atrial fibrillation/pacemaker with chronic Coumadin. Coumadin held secondary to hemorrhagic transformation x 3-4 wk.  To f/u with Neuro post D/C to determine restart date on warfarin Cardiac rate controlled. Continue Toprol 50 mg twice a day for rate control  8. Recent pelvic fracture. Weightbearing as tolerated. No pain with LE ROM 9. Hypothyroidism. Synthroid 10.  urinary incont, UA neg, may be premorbid vs spastic bladder, consider ditropan  LOS (Days) 5 A FACE TO FACE EVALUATION WAS PERFORMED  KIRSTEINS,ANDREW E 06/24/2014, 9:24 AM

## 2014-06-24 NOTE — Plan of Care (Signed)
Problem: RH BLADDER ELIMINATION Goal: RH STG MANAGE BLADDER WITH ASSISTANCE STG Manage Bladder With mod Assistance  Outcome: Progressing Pt is cont at times, but can be incont

## 2014-06-24 NOTE — Progress Notes (Signed)
Occupational Therapy Session Note  Patient Details  Name: Christy Miranda MRN: [id number] Date of Birth: 1921-10-22  Today's Date: 06/24/2014 Time: 0930-1030 Time Calculation (min): 60 min   Skilled Therapeutic Interventions/Progress Updates:could smell BM upon approx for therapy; so, this clinician asssited patient with pericleansing and changing of brief with big loose BM and then donned teds and pants supine in bed with lateral rolls; then transferred to w/c with Total A stand pivot to patient's left side (patient exhibited about 20% of the transfer).   She completed Upper body bathing with Mod A, and she initiated use of her right UE with more strength exerted in her right shoulder, wrist and elblow for extension.    Since patient initiated and exhibited use of her right Upper extremity this clinician left off her right UE trough and assisted her with upright positioning with her safety belt and two towels on the right side of her trunk.  She was taken to the nurses station at the end of the session due to her expressive aphasia and safety concerns.     Therapy Documentation Precautions:  Precautions Precautions: Fall Precaution Comments: Rt sided hemiparesis, aphasia, severe kyphosis/scoliosis, recent pelvic fx, right neglect Restrictions Weight Bearing Restrictions: No LLE Weight Bearing: Weight bearing as tolerated :   Pain: Pain Assessment Pain Assessment: No/denies pain See FIM for current functional status  Therapy/Group: Individual Therapy  Alfredia Ferguson [facility] 06/24/2014, 10:29 AM

## 2014-06-24 NOTE — Plan of Care (Signed)
Problem: RH BOWEL ELIMINATION Goal: RH STG MANAGE BOWEL WITH ASSISTANCE STG Manage Bowel with mod Assistance.  Outcome: Not Progressing Incont BM in brief this AM

## 2014-06-25 ENCOUNTER — Inpatient Hospital Stay (HOSPITAL_COMMUNITY): Payer: Medicare Other

## 2014-06-25 ENCOUNTER — Inpatient Hospital Stay (HOSPITAL_COMMUNITY): Payer: Medicare Other | Admitting: *Deleted

## 2014-06-25 ENCOUNTER — Inpatient Hospital Stay (HOSPITAL_COMMUNITY): Payer: Medicare Other | Admitting: Physical Therapy

## 2014-06-25 ENCOUNTER — Inpatient Hospital Stay (HOSPITAL_COMMUNITY): Payer: Medicare Other | Admitting: Occupational Therapy

## 2014-06-25 MED ORDER — PANTOPRAZOLE SODIUM 40 MG PO PACK
40.0000 mg | PACK | Freq: Every day | ORAL | Status: DC
  Administered 2014-06-25 – 2014-07-05 (×11): 40 mg
  Filled 2014-06-25 (×13): qty 20

## 2014-06-25 NOTE — Progress Notes (Signed)
Physical Therapy Note  Patient Details  Name: Christy Miranda MRN: [id number] Date of Birth: 27-May-1921 Today's Date: 06/25/2014  Pt received seated in w/c in room. Upon PT entering room, pt began shaking head and vocalizing "no, no, no." Initially, pt denied pain, later indicated R arm was painful. Pt had 3 other therapy sessions today and been up in chair for ~5 hours this AM, seemed to be indicating that she was tired. Despite aphasia pt vocalized "please leave." Discussed with RN and NT that pt was likely fatigued from other therapies today. Pt was resistant to all passive movements that PT attempted. Pt missed 60 minutes of scheduled Physical therapy time.   Rada Hay 06/25/2014, 3:52 PM

## 2014-06-25 NOTE — Progress Notes (Signed)
Occupational Therapy Session Note  Patient Details  Name: Christy Miranda MRN: [id number] Date of Birth: 24-Aug-1921  Today's Date: 06/25/2014 Time:  -   1115-1210  (55 min)    Pt. Sitting at nursing station in wc.  Had  Right lateral trunk flexion and  right lataeral neck flexion, forward shoulers and kyphotic throacic spine.  Addressed hip and upper body mobilization, with forward and lateral weight shifts.  Did reaching with LUE and AAROM reaching on right.  Pt.with dysarthia but able to discern yes no answers.  Practiced sit to stand with Bobath technique with total assist.  Pt was mod assist with forwward and backward scooting.  Pt. Returned in wc back to nursing station with nursing assist to obtain more upright, symmetrical sitting.      Short Term Goals: Week 1:  OT Short Term Goal 1 (Week 1): Pt will initiate and complete UB bathing with no more than min assist and min instructional cueing in supported sitting. OT Short Term Goal 2 (Week 1): Pt will perform UB dressing with min assist in supported sitting. OT Short Term Goal 3 (Week 1): Pt will perform LB bathing with max assist sit to stand. OT Short Term Goal 4 (Week 1): Pt will maintain static sitting EOB with no more than min assist for 5 mins in preparation for selfcare tasks. OT Short Term Goal 5 (Week 1): Pt will use the RUE as a stabilizer with mod assist and mod instructional cueing to integrate into selfcare tasks.      Therapy Documentation Precautions:  Precautions Precautions: Fall Precaution Comments: Rt sided hemiparesis, aphasia, severe kyphosis/scoliosis, recent pelvic fx, right neglect Restrictions Weight Bearing Restrictions: No LLE Weight Bearing: Weight bearing as tolerated       Pain:  none   See FIM for current functional status  Therapy/Group: Individual Therapy  Lisa Roca 06/25/2014, 11:03 AM

## 2014-06-25 NOTE — Progress Notes (Signed)
Occupational Therapy Session Note  Patient Details  Name: Christy Miranda MRN: [id number] Date of Birth: 12/17/1920  Today's Date: 06/25/2014 Time: 3734-2876 Time Calculation (min): 45 min   Skilled Therapeutic Interventions/Progress Updates: Ms. Kreiger was seen for ADL today with overall max A.   She initiated use of her right UE and attempted to communicate with this clinican with unrecognizable speech except for "no" or "yes."  She completeted a bed to w/c transfer stand pivot with max A today.   She continued to exhibit today (like yesterday)  Shoulder, elbow, wrist and some digitial motor skills  And was able to complete bathing with R hand over hand mod assist      Therapy Documentation Precautions:  Precautions Precautions: Fall Precaution Comments: Rt sided hemiparesis, aphasia, severe kyphosis/scoliosis, recent pelvic fx, right neglect Restrictions Weight Bearing Restrictions: No LLE Weight Bearing: Weight bearing as tolerated   Pain:denied     See FIM for current functional status  Therapy/Group: Individual Therapy  Alfredia Ferguson [facility] 06/25/2014, 12:28 PM

## 2014-06-25 NOTE — Progress Notes (Addendum)
Physical Therapy Session Note  Patient Details  Name: Christy Miranda MRN: [id number] Date of Birth: 10-16-1921  Today's Date: 06/25/2014 Time: 3474-2595 Time Calculation (min): 45 min  Short Term Goals: Week 1:  PT Short Term Goal 1 (Week 1): Pt will roll R and L with min A with use of bedrails PT Short Term Goal 2 (Week 1): Pt will perform squat pivot transfer at max A level to the R or L PT Short Term Goal 3 (Week 1): Pt will perform dymamic sitting at mod A level PT Short Term Goal 4 (Week 1): Pt will perform sit<>stand at max A level PT Short Term Goal 5 (Week 1): Pt will self propel w/c using L hemi technique at max A level  Skilled Therapeutic Interventions/Progress Updates:    Pt limited at times in session by posterior lean and limited initiation. Pt does benefit from positioning in chair with pillow on R side for improved alignment. Pt may benefit from another cushion for increased stability and alignment in wheelchair, possibly cushion with Roho cut out at sacrum. Pt able to obtain passive scap retraction and neutral cervical spine with ROM, but unable to maintain afterwards. Pt would continue to benefit from skilled PT services to increase functional mobility.  Therapy Documentation Precautions:  Precautions Precautions: Fall Precaution Comments: Rt sided hemiparesis, aphasia, severe kyphosis/scoliosis, recent pelvic fx, right neglect Restrictions Weight Bearing Restrictions: No LLE Weight Bearing: Weight bearing as tolerated General: Amount of Missed PT Time (min): 60 Minutes Missed Time Reason: Patient fatigue (Pt refused, likely 2/2 fatigue) Vital Signs: BP 163/80 Pulse 81 Pain: Pain Assessment Pain Assessment: Faces Faces Pain Scale: Hurts even more Pain Type: Acute pain Pain Location: Arm Pain Orientation: Right Pain Onset: With Activity Pain Intervention(s): RN made aware Mobility:  Pt performs with cues for weight shift, initiation, and  safety Balance: Static Sitting Balance Static Sitting - Balance Support: Left upper extremity supported;Feet supported Static Sitting - Level of Assistance: 4: Min assist Dynamic Sitting Balance Dynamic Sitting - Balance Support: No upper extremity supported Dynamic Sitting - Level of Assistance: 2: Max assist Sitting balance - Comments: coming back to neutral from posterior lean Other Treatments:  Pt performs positioning in wheelchair with Max A including pillow for RUE and neutral spine support. Gentle soft tissue stretches into ext performed and R sidebend performed. Pec minor and pec major myofascial release performed. Sternocleidomastoid release performed along with UT stretching and cervical spine ROM. Quad, gastroc and hip add stretches performed B/L 3x30". Pt educated on importance of OOB activity. Pt given quiet rest breaks and decreased demands to increase participation.  See FIM for current functional status  Therapy/Group: Individual Therapy  Monia Pouch 06/25/2014, 4:24 PM

## 2014-06-25 NOTE — Progress Notes (Signed)
Family requested PRN xanax be given if patient unable to sleep. Xanax given at 2234, slept good since med given. Bilateral heels red and elevated off bed with pillows. Difficult to understand due to  aphasia. Christy Miranda A

## 2014-06-25 NOTE — Progress Notes (Signed)
80 y.o. right-handed female with history of atrial fibrillation status post pacemaker with chronic Coumadin therapy, recent fall with pelvic fractures and had recently been at skilled nursing facility secondary to pelvic fractures.. Admitted 06/13/2049 with right-sided weakness as well as aphasia. INR on admission of 1.35. Initial cranial CT scan negative for acute changes. Patient did receive TPA. Followup cranial CT scan showed no 3.6 cm left basal ganglia parenchymal hemorrhage consistent with hemorrhagic transformation of acute infarct. 5 mm rightward midline shift. Echocardiogram with ejection fraction of 55% no wall motion abnormalities. Carotid Dopplers with no ICA stenosis. Neurology services consulted with workup ongoing. Aspirin held for post TPA hemorrhage. Repeat cranial CT scan 06/19/2014 shows slight interval decrease in size of hematoma centered in the left putamen, measuring 30 x 28 mm  Subjective/Complaints:  Pt aphasic unable to verbalize complaints Sitting at nsg desk in [facility] Objective: Vital Signs: Blood pressure 154/72, pulse 88, temperature 98.2 F (36.8 C), temperature source Oral, resp. rate 17, height 5' (1.524 m), weight 50.984 kg (112 lb 6.4 oz), SpO2 94.00%. No results found. Results for orders placed during the hospital encounter of 06/19/14 (from the past 72 hour(s))  URINE CULTURE     Status: None   Collection Time    06/23/14  2:20 PM      Result Value Ref Range   Specimen Description URINE, CATHETERIZED     Special Requests NONE     Culture  Setup Time       Value: 06/23/2014 17:51     Performed at [facility]       Value: NO GROWTH     Performed at [facility]   Culture       Value: NO GROWTH     Performed at [facility]   Report Status 06/24/2014 FINAL    URINALYSIS, ROUTINE W REFLEX MICROSCOPIC     Status: Abnormal   Collection Time    06/23/14  2:25 PM      Result Value Ref Range   Color, Urine YELLOW  YELLOW    APPearance CLOUDY (*) CLEAR   Specific Gravity, Urine 1.023  1.005 - 1.030   pH 6.5  5.0 - 8.0   Glucose, UA NEGATIVE  NEGATIVE mg/dL   Hgb urine dipstick NEGATIVE  NEGATIVE   Bilirubin Urine NEGATIVE  NEGATIVE   Ketones, ur NEGATIVE  NEGATIVE mg/dL   Protein, ur NEGATIVE  NEGATIVE mg/dL   Urobilinogen, UA 2.0 (*) 0.0 - 1.0 mg/dL   Nitrite NEGATIVE  NEGATIVE   Leukocytes, UA NEGATIVE  NEGATIVE   Comment: MICROSCOPIC NOT DONE ON URINES WITH NEGATIVE PROTEIN, BLOOD, LEUKOCYTES, NITRITE, OR GLUCOSE <1000 mg/dL.      Constitutional:  80 year old frail white female. Head forward posture and leaning to the right. Eyes:  Pupils reactive to light  Neck: Normal range of motion. Neck supple. No thyromegaly present.  Cardiovascular:  Irreg Irreg. No murmur  Respiratory: Effort normal and breath sounds normal. No respiratory distress.  GI: Soft. Bowel sounds are normal. She exhibits no distension. Non-tender Neurological:  Patient is alert. Left gaze preference. She was expressively and receptively aphasic with apraxia. She did follow a few simple commands. Made eye contact when cued.RUE: 2-/5 deltoid, bicep, tricep, 3-/5 finger flex. RLE: 3- HF, KE, and 2- ADF/PF but inconsistent. Sensation diminished but senses pinch right arm/leg. Increased tone R biceps MAS1 Skin: Skin is warm and dry  Psych: flat, calm, collected Ext- ecchymosis  R achilles area  Assessment/Plan: 1. Functional deficits secondary to  left basal ganglia parenchymal hemorrhage consistent with hemorrhagic transformation of acute infarct which require 3+ hours per day of interdisciplinary therapy in a comprehensive inpatient rehab setting. Physiatrist is providing close team supervision and 24 hour management of active medical problems listed below. Physiatrist and rehab team continue to assess barriers to discharge/monitor patient progress toward functional and medical goals.  FIM: FIM - Bathing Bathing Steps Patient  Completed: Chest;Right Arm;Abdomen;Right upper leg;Left upper leg Bathing: 3: Mod-Patient completes 5-7 43f 10 parts or 50-74%  FIM - Upper Body Dressing/Undressing Upper body dressing/undressing steps patient completed: Thread/unthread left sleeve of pullover shirt/dress Upper body dressing/undressing: 2: Max-Patient completed 25-49% of tasks FIM - Lower Body Dressing/Undressing Lower body dressing/undressing steps patient completed: Thread/unthread left pants leg Lower body dressing/undressing: 1: Total-Patient completed less than 25% of tasks  FIM - Toileting Toileting: 0: Activity did not occur  FIM - Air cabin crew Transfers: 0-Activity did not occur  FIM - Control and instrumentation engineer Devices: HOB elevated;Bed rails;Arm rests Bed/Chair Transfer: 1: Two helpers  FIM - Locomotion: Wheelchair Locomotion: Wheelchair: 0: Activity did not occur FIM - Locomotion: Ambulation Locomotion: Ambulation Assistive Devices: Parallel bars Ambulation/Gait Assistance: 1: +2 Total assist Locomotion: Ambulation: 0: Activity did not occur  Comprehension Comprehension Mode: Auditory Comprehension: 2-Understands basic 25 - 49% of the time/requires cueing 51 - 75% of the time  Expression Expression Mode: Verbal Expression: 1-Expresses basis less than 25% of the time/requires cueing greater than 75% of the time.  Social Interaction Social Interaction: 2-Interacts appropriately 25 - 49% of time - Needs frequent redirection.  Problem Solving Problem Solving: 1-Solves basic less than 25% of the time - needs direction nearly all the time or does not effectively solve problems and may need a restraint for safety  Memory Memory: 1-Recognizes or recalls less than 25% of the time/requires cueing greater than 75% of the time  Medical Problem List and Plan:  1. 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 No aspirin at this  time repeat CT scan in one week to decide upon initiation of aspirin  2. DVT Prophylaxis/Anticoagulation: SCDs. Monitor for any signs of DVT  3. Pain Management: Tylenol as needed  4. Mood/anxiety. Xanax 0.25 mg 3 times a day as needed. Provide emotional support:  5. Neuropsych: This patient is not capable of making decisions on her own behalf.   6. Dysphagia. Dysphagia 1 with honey thick liquids. Continue IV fluids for hydration. Followup chemistries  7. History of atrial fibrillation/pacemaker with chronic Coumadin. Coumadin held secondary to hemorrhagic transformation x 3-4 wk.  To f/u with Neuro post D/C to determine restart date on warfarin Cardiac rate controlled. Continue Toprol 50 mg twice a day for rate control  8. Recent pelvic fracture. Weightbearing as tolerated. No pain with LE ROM 9. Hypothyroidism. Synthroid 10.  urinary incont, UA neg,   LOS (Days) 6 A FACE TO FACE EVALUATION WAS PERFORMED  Sulaiman Imbert E 06/25/2014, 9:28 AM

## 2014-06-26 ENCOUNTER — Inpatient Hospital Stay (HOSPITAL_COMMUNITY): Payer: Medicare Other | Admitting: Physical Therapy

## 2014-06-26 ENCOUNTER — Encounter (HOSPITAL_COMMUNITY): Payer: Medicare Other | Admitting: Occupational Therapy

## 2014-06-26 ENCOUNTER — Inpatient Hospital Stay (HOSPITAL_COMMUNITY): Payer: Medicare Other | Admitting: Speech Pathology

## 2014-06-26 DIAGNOSIS — I4891 Unspecified atrial fibrillation: Secondary | ICD-10-CM

## 2014-06-26 DIAGNOSIS — I6330 Cerebral infarction due to thrombosis of unspecified cerebral artery: Secondary | ICD-10-CM

## 2014-06-26 DIAGNOSIS — I10 Essential (primary) hypertension: Secondary | ICD-10-CM

## 2014-06-26 DIAGNOSIS — Z5189 Encounter for other specified aftercare: Secondary | ICD-10-CM

## 2014-06-26 MED ORDER — RESOURCE THICKENUP CLEAR PO POWD
ORAL | Status: DC | PRN
Start: 2014-06-26 — End: 2014-07-06
  Filled 2014-06-26: qty 125

## 2014-06-26 NOTE — Progress Notes (Signed)
80 y.o. right-handed female with history of atrial fibrillation status post pacemaker with chronic Coumadin therapy, recent fall with pelvic fractures and had recently been at skilled nursing facility secondary to pelvic fractures.. Admitted 06/13/2049 with right-sided weakness as well as aphasia. INR on admission of 1.35. Initial cranial CT scan negative for acute changes. Patient did receive TPA. Followup cranial CT scan showed no 3.6 cm left basal ganglia parenchymal hemorrhage consistent with hemorrhagic transformation of acute infarct. 5 mm rightward midline shift. Echocardiogram with ejection fraction of 55% no wall motion abnormalities. Carotid Dopplers with no ICA stenosis. Neurology services consulted with workup ongoing. Aspirin held for post TPA hemorrhage. Repeat cranial CT scan 06/19/2014 shows slight interval decrease in size of hematoma centered in the left putamen, measuring 30 x 28 mm  Subjective/Complaints:  Pt aphasic unable to verbalize complaints Sitting at nsg desk in [facility] Objective: Vital Signs: Blood pressure 132/73, pulse 84, temperature 98.8 F (37.1 C), temperature source Oral, resp. rate 18, height 5' (1.524 m), weight 50.984 kg (112 lb 6.4 oz), SpO2 95.00%. No results found. Results for orders placed during the hospital encounter of 06/19/14 (from the past 72 hour(s))  URINE CULTURE     Status: None   Collection Time    06/23/14  2:20 PM      Result Value Ref Range   Specimen Description URINE, CATHETERIZED     Special Requests NONE     Culture  Setup Time       Value: 06/23/2014 17:51     Performed at [place]       Value: NO GROWTH     Performed at [facility]   Culture       Value: NO GROWTH     Performed at [facility]   Report Status 06/24/2014 FINAL    URINALYSIS, ROUTINE W REFLEX MICROSCOPIC     Status: Abnormal   Collection Time    06/23/14  2:25 PM      Result Value Ref Range   Color, Urine YELLOW  YELLOW    APPearance CLOUDY (*) CLEAR   Specific Gravity, Urine 1.023  1.005 - 1.030   pH 6.5  5.0 - 8.0   Glucose, UA NEGATIVE  NEGATIVE mg/dL   Hgb urine dipstick NEGATIVE  NEGATIVE   Bilirubin Urine NEGATIVE  NEGATIVE   Ketones, ur NEGATIVE  NEGATIVE mg/dL   Protein, ur NEGATIVE  NEGATIVE mg/dL   Urobilinogen, UA 2.0 (*) 0.0 - 1.0 mg/dL   Nitrite NEGATIVE  NEGATIVE   Leukocytes, UA NEGATIVE  NEGATIVE   Comment: MICROSCOPIC NOT DONE ON URINES WITH NEGATIVE PROTEIN, BLOOD, LEUKOCYTES, NITRITE, OR GLUCOSE <1000 mg/dL.      Constitutional:  80 year old frail white female. Head forward posture and leaning to the right. Eyes:  Pupils reactive to light  Neck: Normal range of motion. Neck supple. No thyromegaly present.  Cardiovascular:  Irreg Irreg. No murmur  Respiratory: Effort normal and breath sounds normal. No respiratory distress.  GI: Soft. Bowel sounds are normal. She exhibits no distension. Non-tender Neurological:  Patient is alert. Left gaze preference. She was expressively and receptively aphasic with apraxia. She did follow a few simple commands. Made eye contact when cued.RUE: 2-/5 deltoid, bicep, tricep, 3-/5 finger flex. RLE: 3- HF, KE, and 2- ADF/PF but inconsistent.  Increased tone R biceps MAS1 Skin: Skin is warm and dry  Psych: flat, without agitation Ext- neg edema  Assessment/Plan: 1. Functional deficits  secondary to  left basal ganglia parenchymal hemorrhage consistent with hemorrhagic transformation of acute infarct which require 3+ hours per day of interdisciplinary therapy in a comprehensive inpatient rehab setting. Physiatrist is providing close team supervision and 24 hour management of active medical problems listed below. Physiatrist and rehab team continue to assess barriers to discharge/monitor patient progress toward functional and medical goals.  FIM: FIM - Bathing Bathing Steps Patient Completed: Chest;Right Arm;Abdomen;Front perineal area;Left upper  leg Bathing: 3: Mod-Patient completes 5-7 21f 10 parts or 50-74%  FIM - Upper Body Dressing/Undressing Upper body dressing/undressing steps patient completed: Thread/unthread left sleeve of pullover shirt/dress Upper body dressing/undressing: 1: Total-Patient completed less than 25% of tasks FIM - Lower Body Dressing/Undressing Lower body dressing/undressing steps patient completed: Thread/unthread left pants leg Lower body dressing/undressing: 1: Total-Patient completed less than 25% of tasks  FIM - Musician Devices: Grab bar or rail for support Toileting: 1: Two helpers  FIM - Radio producer Devices: Product manager Transfers: 1-Two helpers  FIM - Control and instrumentation engineer Devices: HOB elevated;Bed rails;Arm rests Bed/Chair Transfer: 1: Supine > Sit: Total A (helper does all/Pt. < 25%);2: Bed > Chair or W/C: Max A (lift and lower assist);2: Chair or W/C > Bed: Max A (lift and lower assist)  FIM - Locomotion: Wheelchair Locomotion: Wheelchair: 0: Activity did not occur FIM - Locomotion: Ambulation Locomotion: Ambulation Assistive Devices: Parallel bars Ambulation/Gait Assistance: 1: +2 Total assist Locomotion: Ambulation: 0: Activity did not occur  Comprehension Comprehension Mode: Auditory Comprehension: 2-Understands basic 25 - 49% of the time/requires cueing 51 - 75% of the time  Expression Expression Mode: Verbal Expression: 1-Expresses basis less than 25% of the time/requires cueing greater than 75% of the time.  Social Interaction Social Interaction: 2-Interacts appropriately 25 - 49% of time - Needs frequent redirection.  Problem Solving Problem Solving: 1-Solves basic less than 25% of the time - needs direction nearly all the time or does not effectively solve problems and may need a restraint for safety  Memory Memory: 1-Recognizes or recalls less than 25% of the time/requires cueing greater  than 75% of the time  Medical Problem List and Plan:  1. 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 No aspirin at this time repeat CT scan in one week to decide upon initiation of aspirin  2. DVT Prophylaxis/Anticoagulation: SCDs. Monitor for any signs of DVT  3. Pain Management: Tylenol as needed  4. Mood/anxiety. Xanax 0.25 mg 3 times a day as needed. Provide emotional support:  5. Neuropsych: This patient is not capable of making decisions on her own behalf.   6. Dysphagia. Dysphagia 1 with honey thick liquids. Continue IV fluids for hydration. Followup chemistries  7. History of atrial fibrillation/pacemaker with chronic Coumadin. Coumadin held secondary to hemorrhagic transformation x 3-4 wk.  To f/u with Neuro post D/C to determine restart date on warfarin Cardiac rate controlled. Continue Toprol 50 mg twice a day for rate control  8. Recent pelvic fracture. Weightbearing as tolerated. No pain with LE ROM 9. Hypothyroidism. Synthroid 10.  urinary incont, UA neg,   LOS (Days) 7 A FACE TO FACE EVALUATION WAS PERFORMED  KIRSTEINS,ANDREW E 06/26/2014, 7:22 AM

## 2014-06-26 NOTE — Progress Notes (Signed)
Patient's daughter inquired about using clobetasol propionate cream on patient's bilateral inner feet.  Per daughter, patient had been followed by her dermatologist and was prescribed this medication for her feet prior to admission.  Marlowe [place], PA notified; new order received.  Will continue to monitor.

## 2014-06-26 NOTE — Progress Notes (Signed)
Social Work Patient ID: Christy Miranda, female   DOB: 07-09-1921, 80 y.o.   MRN: [id number]  Christy Mesi Emmy Keng, LCSW Social Worker Signed  Patient Care Conference Service date: 06/22/2014 12:36 PM  Inpatient RehabilitationTeam Conference and Plan of Care Update Date: 06/21/2014   Time: 11:30 AM     Patient Name: Christy Miranda       Medical Record Number: [id number]   Date of Birth: 11-18-1921 Sex: Female         Room/Bed: 4W17C/4W17C-01 Payor Info: Payor: MEDICARE / Plan: MEDICARE PART A AND B / Product Type: *No Product type* /   Admitting Diagnosis: L CVA   Admit Date/Time:  06/19/2014  6:51 PM Admission Comments: No comment available   Primary Diagnosis:  <principal problem not specified> Principal Problem: <principal problem not specified>    Patient Active Problem List     Diagnosis  Date Noted   .  CVA (cerebral infarction)  06/19/2014   .  Undernutrition  06/19/2014   .  Fracture of multiple pubic rami  05/22/2014   .  Encounter for therapeutic drug monitoring  01/02/2014   .  Dizziness and giddiness  07/22/2013   .  Thrombocytopenia  11/16/2012   .  Deep venous thrombosis of lower leg  11/13/2012   .  Subtherapeutic international normalized ratio (INR)  11/13/2012   .  Pancytopenia  11/13/2012   .  Edema  11/07/2012   .  Pacemaker-Medtronic  08/19/2012   .  Current use of long term anticoagulation  08/08/2011   .  Atrial fibrillation  02/28/2011   .  Tachycardia-bradycardia syndrome  02/28/2011   .  Hypertension  02/28/2011     Expected Discharge Date: Expected Discharge Date: 07/03/14  Team Members Present: Physician leading conference: Dr. Alysia Penna Social Worker Present: Alfonse Alpers, LCSW Nurse Present: Elliot Cousin, RN PT Present: Cameron Sprang, PT OT Present: Clyda Greener, OT SLP Present: Gunnar Fusi, SLP PPS Coordinator present : Daiva Nakayama, RN, CRRN;Becky Alwyn Ren, PT        Current Status/Progress  Goal  Weekly Team Focus   Medical      very slow with feeding, D1 honey via tsp, severe scoliosis, positioning issues  meet fluid and calorie needs po  WC positioing, encourage intake   Bowel/Bladder     incont.of bowel and bladder. LBM 06/20/14  Mangaged bowel and bladder  Timed time toileting during the day   Swallow/Nutrition/ Hydration     Dys 1 textures and honey-thick liquids via teaspoon with Mod assist   least restrictive PO with Min assist   pharyngeal strengthening exercises, Dys 2 trials, carryover od safe swallow strategies   ADL's     Pt currently performs functional transfers at a total assist stand pivot level. She needs total assist +2 (pt 30%) for LB selfcare tasks sit to stand and for toileting tasks. Max assist also needed for UB dressing with bathing performed at mod assist level. She demonstrates Brunnstrum stage III in right arm and stage IV in the hand. Decreased motor planning also notes as well as possible right visual field cut and neglect.   overall mod assist for LB selfcare and functional transfers except for shower transfers which are set at max assist level currently.   selfcare retraining, sustained attention, neuromuscular re-duction, pt/family education, positioning.   Mobility     Pt currently requires mod to total A for bed mobility, total A for squat pivot transfers, max to total A for standing.  did not assess standing balance or gait/stairs due to safety.  Spent a lot of time of w/c positioning/seating needs due to severe kyphosis/scoliosis.   mod A transfers, min A w/c level (may have to downgrade due to cognition)  bed mobility, transfers, standing, sitting balance, d/c planning   Communication     Max-Total receptive and Total expressive   Max assist   increase automatic responses within context, increase awareness of errors   Safety/Cognition/ Behavioral Observations    Max-Total assist   Max assist   increase awareness of perseveration and timely cessation to tasks   Pain     No c/o pain    Pain <3  Assess for and treat pain q shift as needed    Skin     Scab to L ankle, Bruising to R leg, L eye, and R arm  No new breakdown  Assess skin q shift for breakdown    Rehab Goals Patient on target to meet rehab goals: Yes Rehab Goals Revised: None - this is pt's first conference *See Care Plan and progress notes for long and short-term goals.    Barriers to Discharge:  very elderly, heavy assist      Possible Resolutions to Barriers:    ?SNF     Discharge Planning/Teaching Needs:    Pt will either go home with dtr and paid caregivers or go to a SNF.   Dtr only works 3 days a week and can be here for family education as needed.    Team Discussion:    Pt had a stroke while at a SNF where she was recovering from a pelvic fracture.  UA in process for possible UTI.  Pt is currently max-total assist with squat pivot transfers.  She has movement, but no sensation on her right side.  Pt will need 24/7 mod-max assist at home.  Pt has little awareness of food pooling or draining from her mouth, per SLP.  She was able to feed herself.  Pt is on a D1 honey thick liquids via teaspoon diet, thus still requiring fluids at night.  Pt has receptive and expressive difficulties.     Revisions to Treatment Plan:    None    Continued Need for Acute Rehabilitation Level of Care: The patient requires daily medical management by a physician with specialized training in physical medicine and rehabilitation for the following conditions: Daily direction of a multidisciplinary physical rehabilitation program to ensure safe treatment while eliciting the highest outcome that is of practical value to the patient.: Yes Daily medical management of patient stability for increased activity during participation in an intensive rehabilitation regime.: Yes Daily analysis of laboratory values and/or radiology reports with any subsequent need for medication adjustment of medical intervention for : Neurological  problems  Laquanta Hummel, Christy Mesi 06/22/2014, 12:36 PM

## 2014-06-26 NOTE — Progress Notes (Signed)
Speech Language Pathology Daily Session Note  Patient Details  Name: Christy Miranda MRN: [id number] Date of Birth: 08-21-1921  Today's Date: 06/26/2014 Time: 1305-1405 Time Calculation (min): 60 min  Short Term Goals: Week 1: SLP Short Term Goal 1 (Week 1): Patient will consume Dys. 1 textures and honey-thick liquids via teaspoon with Min cues for pacing and portion control  to minimize overt s/s of aspiration. SLP Short Term Goal 2 (Week 1): Patient will participate in automatic verbal tasks wtih Max verbal and written cues. SLP Short Term Goal 3 (Week 1): Patient will identify named object from a field of 2 with Max multimodal cues. SLP Short Term Goal 4 (Week 1): Patient will solve basic problems related to self care with Mod multiodal cues.  SLP Short Term Goal 5 (Week 1): Patient will sustain attention to basic self-care tasks for 2 minutes with Mod cues for redirection.  Skilled Therapeutic Interventions:  Pt was seen for skilled speech therapy targeting naming and basic problem solving.  Upon arrival, pt was seated upright in wheelchair and had requested to use the bathroom via vocalizations, facial expressions, and gestures, per pt's daughter's report.  SLP transferred pt to toilet with assist from nurse tech and mod assist multimodal cuing for following directions when transferring where pt had an incontinent/continent bowel movement.   Pt indicated that she was finished via targeted yes/no questions and mod assist verbal cues.  During structured naming practice, pt was ~25% accurate for naming basic objects with written visual aids and max assist phonemic placement cues.  Additionally, pt was ~50% accurate with max faded to mod assist for recognition when matching object to picture from a field of 2.  Continue per current plan of care.    FIM:  Comprehension Comprehension Mode: Auditory Comprehension: 2-Understands basic 25 - 49% of the time/requires cueing 51 - 75% of the  time Expression Expression Mode: Verbal Expression: 1-Expresses basis less than 25% of the time/requires cueing greater than 75% of the time. Social Interaction Social Interaction: 2-Interacts appropriately 25 - 49% of time - Needs frequent redirection. Problem Solving Problem Solving: 1-Solves basic less than 25% of the time - needs direction nearly all the time or does not effectively solve problems and may need a restraint for safety Memory Memory: 1-Recognizes or recalls less than 25% of the time/requires cueing greater than 75% of the time  Pain Pain Assessment Pain Assessment: No/denies pain  Therapy/Group: Individual Therapy  Windell Moulding, M.A. CCC-SLP  Melinna Linarez, Selinda Orion 06/26/2014, 3:55 PM

## 2014-06-26 NOTE — Progress Notes (Signed)
Occupational Therapy Session Note  Patient Details  Name: Christy Miranda MRN: [id number] Date of Birth: 18-Oct-1921  Today's Date: 06/26/2014 Time: 6301-6010 Time Calculation (min): 57 min  Short Term Goals: Week 1:  OT Short Term Goal 1 (Week 1): Pt will initiate and complete UB bathing with no more than min assist and min instructional cueing in supported sitting. OT Short Term Goal 2 (Week 1): Pt will perform UB dressing with min assist in supported sitting. OT Short Term Goal 3 (Week 1): Pt will perform LB bathing with max assist sit to stand. OT Short Term Goal 4 (Week 1): Pt will maintain static sitting EOB with no more than min assist for 5 mins in preparation for selfcare tasks. OT Short Term Goal 5 (Week 1): Pt will use the RUE as a stabilizer with mod assist and mod instructional cueing to integrate into selfcare tasks.   Skilled Therapeutic Interventions/Progress Updates:    Engaged in ADL retraining with focus on Rt attention, functional use of RUE, sit > stand, and stand pivot transfers.  Pt initiated use of RUE and utilized RUE as gross assist with doffing gown, requiring hand over hand assist to fully complete task.  Continues to require max assist to complete UB dressing and total assist for LB dressing.  Pt attempted to communicate with this clinican with unrecognizable speech, therapist attempted to facilitate through yes/no questions.  Performed stand pivot transfer to/from toilet with use of grab bar with max assist.  Sit > stand for hygiene with min steady assist with use of grab bar while standing and therapist completed hygiene.  Engaged in [place] in sitting with pt exhibiting shoulder, elbow, wrist, and digits motor skills, however not coordinated and with decreased follow of verbal cues.   Therapy Documentation Precautions:  Precautions Precautions: Fall Precaution Comments: Rt sided hemiparesis, aphasia, severe kyphosis/scoliosis, recent pelvic fx, right  neglect Restrictions Weight Bearing Restrictions: No LLE Weight Bearing: Weight bearing as tolerated General:   Vital Signs: Therapy Vitals Pulse Rate: 90 BP: 145/79 mmHg Pain: Pain Assessment Pain Assessment: Faces Faces Pain Scale: No hurt  See FIM for current functional status  Therapy/Group: Individual Therapy  Simonne Come 06/26/2014, 10:42 AM

## 2014-06-26 NOTE — Progress Notes (Signed)
Physical Therapy Session Note  Patient Details  Name: Christy Miranda MRN: [id number] Date of Birth: 1921/03/18  Today's Date: 06/26/2014 Time: 0830-0900 and 6010-9323 Time Calculation (min): 30 min and 33 min  Short Term Goals: Week 1:  PT Short Term Goal 1 (Week 1): Pt will roll R and L with min A with use of bedrails PT Short Term Goal 2 (Week 1): Pt will perform squat pivot transfer at max A level to the R or L PT Short Term Goal 3 (Week 1): Pt will perform dymamic sitting at mod A level PT Short Term Goal 4 (Week 1): Pt will perform sit<>stand at max A level PT Short Term Goal 5 (Week 1): Pt will self propel w/c using L hemi technique at max A level  Skilled Therapeutic Interventions/Progress Updates:  Session 1: Pt received sitting up in bed, finishing breakfast with RN tech. Pt agreeable to therapy and assisted in scooting to EOB with mod-max A. Stand pivot transfer bed > w/c with max A. Gait training in hall with use of L rail x 15 ft and overall mod A, therapist facilitating weight shift to L to clear R LE and occasional assist to place R LE with step-through pattern, with mirror anterior for visual feedback .Pt requires max vc's to "look at yourself in mirror" to effectively facilitate forward gaze and cervical extension throughout session. Pt transferred sit <> stand with mod A, vc's for hand placement and manual facilitation of anterior weight shift, pt initiating gait although cued for static standing for improved postural control and standing balance. In standing, pt with heavy dependence on L rail and lateral lean to R due to narrow BOS and decreased WB through R side. Pt returned to RN station and left with quick release belt on and R arm tray to await OT session in 30 minutes. Focus on standing balance and postural control, gait, and transfers.   Session 2: Pt received sitting in w/c, hand off from OT. Pt assisted in propelling w/c x 30 ft with use of L UE and max A. Squat pivot  transfer w/c <> mat with mod A. Sitting EOM, pt with wedge placed posteriorly to promote anterior pelvic tilt and mirror anterior for visual feedback. Focus on sit <> stand transfers from mat with max cues for hand placement and technique, max-total A. Attempted with RUE around therapist's shoulder and LUE around therapist's shoulder with no improvement noted. Pt's daughter arriving, max cues for patient to scan to R to engage with daughter. Daughter asking about pt's walking this date; transitioned to gait training in hallway with L rail x 10 ft and 15 ft, min-mod A and mirror anterior to promote forward gaze. Pt with wider base of support compared to first session resulting in improved RLE placement with no assist for advancement. With use of L rail, pt overall min A for sit <> stand transfers with therapist facilitating anterior weight shift. Pt returned to room and left sitting in w/c with QRB on and daughter present.   Therapy Documentation Precautions:  Precautions Precautions: Fall Precaution Comments: Rt sided hemiparesis, aphasia, severe kyphosis/scoliosis, recent pelvic fx, right neglect Restrictions Weight Bearing Restrictions: No LLE Weight Bearing: Weight bearing as tolerated Vital Signs: Therapy Vitals Temp: 98.8 F (37.1 C) Temp src: Oral Pulse Rate: 90 Resp: 18 BP: 145/79 mmHg Patient Position (if appropriate): Lying Oxygen Therapy SpO2: 95 % O2 Device: None (Room air) Pain:  No s/s of pain   See FIM for current functional  status  Therapy/Group: Individual Therapy  Laretta Alstrom 06/26/2014, 9:26 AM

## 2014-06-26 NOTE — Progress Notes (Signed)
PRN xanax given at 2127, per family's request for sleep and anxiety. Slept well during night. Incontinent of urine twice during night. Very difficult to understand due to aphasia. LBM 07/26, continent. Poor PO intake. Christy Miranda A

## 2014-06-27 ENCOUNTER — Inpatient Hospital Stay (HOSPITAL_COMMUNITY): Payer: Medicare Other | Admitting: Physical Therapy

## 2014-06-27 ENCOUNTER — Encounter (HOSPITAL_COMMUNITY): Payer: Medicare Other

## 2014-06-27 ENCOUNTER — Inpatient Hospital Stay (HOSPITAL_COMMUNITY): Payer: Medicare Other | Admitting: Speech Pathology

## 2014-06-27 DIAGNOSIS — I4891 Unspecified atrial fibrillation: Secondary | ICD-10-CM

## 2014-06-27 DIAGNOSIS — Z5189 Encounter for other specified aftercare: Secondary | ICD-10-CM

## 2014-06-27 DIAGNOSIS — I6330 Cerebral infarction due to thrombosis of unspecified cerebral artery: Secondary | ICD-10-CM

## 2014-06-27 DIAGNOSIS — I10 Essential (primary) hypertension: Secondary | ICD-10-CM

## 2014-06-27 MED ORDER — ENSURE PUDDING PO PUDG
1.0000 | Freq: Two times a day (BID) | ORAL | Status: DC
  Administered 2014-06-27 – 2014-07-02 (×8): 1 via ORAL

## 2014-06-27 MED ORDER — OXYBUTYNIN CHLORIDE 5 MG PO TABS
5.0000 mg | ORAL_TABLET | Freq: Every day | ORAL | Status: DC
  Administered 2014-06-27 – 2014-07-05 (×9): 5 mg via ORAL
  Filled 2014-06-27 (×10): qty 1

## 2014-06-27 NOTE — Progress Notes (Signed)
Speech Language Pathology Weekly Progress and Session Note  Patient Details  Name: Christy Miranda MRN: [id number] Date of Birth: Feb 09, 1921  Beginning of progress report period: June 20, 2014 End of progress report period: June 27, 2014  Today's Date: 06/27/2014 Time: 5638-9373 Time Calculation (min): 30 min  Short Term Goals: Week 1: SLP Short Term Goal 1 (Week 1): Patient will consume Dys. 1 textures and honey-thick liquids via teaspoon with Min cues for pacing and portion control  to minimize overt s/s of aspiration. SLP Short Term Goal 1 - Progress (Week 1): Met SLP Short Term Goal 2 (Week 1): Patient will participate in automatic verbal tasks wtih Max verbal and written cues. SLP Short Term Goal 2 - Progress (Week 1): Progressing toward goal SLP Short Term Goal 3 (Week 1): Patient will identify named object from a field of 2 with Max multimodal cues. SLP Short Term Goal 3 - Progress (Week 1): Met SLP Short Term Goal 4 (Week 1): Patient will solve basic problems related to self care with Mod multiodal cues.  SLP Short Term Goal 4 - Progress (Week 1): Progressing toward goal SLP Short Term Goal 5 (Week 1): Patient will sustain attention to basic self-care tasks for 2 minutes with Mod cues for redirection. SLP Short Term Goal 5 - Progress (Week 1): Met    New Short Term Goals: Week 2: SLP Short Term Goal 1 (Week 2): Patient will consume trials of advanced consistencies with no overt s/s of aspiration and min cuing for use of swallowing precaution for diet upgrade. SLP Short Term Goal 2 (Week 2): Patient will participate in automatic verbal tasks wtih Max verbal and written cues. SLP Short Term Goal 3 (Week 2): Patient will identify named object from a field of 2 with Mod multimodal cues. SLP Short Term Goal 4 (Week 2): Patient will solve basic problems related to self care with Mod multiodal cues.  SLP Short Term Goal 5 (Week 2): Patient will sustain attention to basic self-care tasks  for 3-5 minutes with Mod cues for redirection.  Weekly Progress Updates:  Pt made functional gains, meeting 3 out of 5 short term goals due to improved sustained attention, naming from field of two, and use of swallowing precautions during meals.  Overall, pt currently requires max assist for functional communication to make needs wants known during both structured and unstructured therapeutic tasks and is utilizing her swallowing precautions with overall min-mod assist to minimize overt s/s of aspiration with dys 1 textures and honey thick liquids via teaspoon.  Family education is ongoing and pt would benefit from continued skilled speech services while inpatient targeting cognitive-linguistic function and swallowing safety in order to maximize functional independence and reduce burden of care upon discharge.     Intensity: Minumum of 1-2 x/day, 30 to 90 minutes Frequency: 5 out of 7 days Duration/Length of Stay: ~14 days Treatment/Interventions: Cognitive remediation/compensation;Cueing hierarchy;Dysphagia/aspiration precaution training;Environmental controls;Functional tasks;Internal/external aids;Multimodal communication approach;Oral motor exercises;Patient/family education;Speech/Language facilitation;Therapeutic Activities   Daily Session  Skilled Therapeutic Interventions: Pt was seen for skilled speech therapy targeting dysphagia management.  Pt was partially reclined in bed and initially refused to participate in [place]; however, she was agreeable to participate in bedside therapy.  SLP positioned the pt to be as upright as possible during presentations of honey thick liquids via teaspoon and trials via cup sips. Pt exhibited no overt s/s of aspiration with honey thick liquids via teaspoon or cup sips with pt presenting with what appeared to be a  timely swallow initiation.  Pt was noted with mild anterior labial spillage for which she required mod assist multimodal cuing to correct.  Pt's sister  was also present for the majority of today's tx session and was provided with education related to pt's current goals and progress in therapy.  Goals were updated on this date to reflect current plan of care.          FIM:  Comprehension Comprehension Mode: Auditory Comprehension: 2-Understands basic 25 - 49% of the time/requires cueing 51 - 75% of the time Expression Expression Mode: Verbal Expression: 1-Expresses basis less than 25% of the time/requires cueing greater than 75% of the time. Social Interaction Social Interaction: 2-Interacts appropriately 25 - 49% of time - Needs frequent redirection. Problem Solving Problem Solving: 1-Solves basic less than 25% of the time - needs direction nearly all the time or does not effectively solve problems and may need a restraint for safety Memory Memory: 1-Recognizes or recalls less than 25% of the time/requires cueing greater than 75% of the time FIM - Eating Eating Activity: 4: Helper occasionally scoops food on utensil General    Pain Pain Assessment Pain Assessment: No/denies pain  Therapy/Group: Individual Therapy  Windell Moulding, M.A. CCC-SLP  Stylianos Stradling, Selinda Orion 06/27/2014, 5:20 PM

## 2014-06-27 NOTE — Progress Notes (Signed)
Physical Therapy Session Note  Patient Details  Name: Christy Miranda MRN: [id number] Date of Birth: [birth year]  Today's Date: 06/27/2014 Time: 1040-1140 Time Calculation (min): 60 min  Short Term Goals: Week 1:  PT Short Term Goal 1 (Week 1): Pt will roll R and L with min A with use of bedrails PT Short Term Goal 2 (Week 1): Pt will perform squat pivot transfer at max A level to the R or L PT Short Term Goal 3 (Week 1): Pt will perform dymamic sitting at mod A level PT Short Term Goal 4 (Week 1): Pt will perform sit<>stand at max A level PT Short Term Goal 5 (Week 1): Pt will self propel w/c using L hemi technique at max A level  Skilled Therapeutic Interventions/Progress Updates:   Session focused on ambulation, R NMR, and wheelchair positioning. Pt received sitting in w/c, dietitian departing. Pt assisted with propelling w/c with LUE x 150 ft with max A. Gait training in hall with L rail x 15 ft and x 25 ft, overall min A for weightshifting R/L to promote improved foot clearance and postural control, no assist required for L foot advancement/placement, mirror anterior for visual feedback to facilitate forward gaze. Pt performed multiple sit <> stand with overall min A, L UE support on rail or w/c and emphasis on anterior weightshift and upright posture once in standing. NuStep using BLEs for R LE NMR for improved activation, timing, and sequencing for total of 10 min. Pt performed stand pivot transfer w/c <> NuStep with overall mod A, vc's for sequencing. Remainder of session focused on w/c positioning and adjustment of [place] cushion for optimal pressure relief. Pt left sitting in w/c with QRB on at RN station, RN tech notified to return patient to bed for rest/pressure relief after lunch and before PM therapies.   Therapy Documentation Precautions:  Precautions Precautions: Fall Precaution Comments: Rt sided hemiparesis, aphasia, severe kyphosis/scoliosis, recent pelvic fx, right  neglect Restrictions Weight Bearing Restrictions: No LLE Weight Bearing: Weight bearing as tolerated Pain: Pain Assessment Pain Assessment: No/denies pain  See FIM for current functional status  Therapy/Group: Individual Therapy  Laretta Alstrom 06/27/2014, 11:52 AM

## 2014-06-27 NOTE — Discharge Instructions (Signed)
Inpatient Rehab Discharge Instructions  Christy Miranda Discharge date and time: No discharge date for patient encounter.   Activities/Precautions/ Functional Status: Activity: activity as tolerated Diet:  Wound Care: none needed Functional status:  ___ No restrictions     ___ Walk up steps independently _x__ 24/7 supervision/assistance   ___ Walk up steps with assistance ___ Intermittent supervision/assistance  ___ Bathe/dress independently ___ Walk with walker     ___ Bathe/dress with assistance ___ Walk Independently    ___ Shower independently _x__ Walk with assistance    ___ Shower with assistance ___ No alcohol     ___ Return to work/school ________  Special Instructions:    My questions have been answered and I understand these instructions. I will adhere to these goals and the provided educational materials after my discharge from the hospital.  Patient/Caregiver Signature _______________________________ Date __________  Clinician Signature _______________________________________ Date __________  Please bring this form and your medication list with you to all your follow-up doctor's appointments.  STROKE/TIA DISCHARGE INSTRUCTIONS SMOKING Cigarette smoking nearly doubles your risk of having a stroke & is the single most alterable risk factor  If you smoke or have smoked in the last 12 months, you are advised to quit smoking for your health.  Most of the excess cardiovascular risk related to smoking disappears within a year of stopping.  Ask you doctor about anti-smoking medications  [place] Quit Line: 1-800-QUIT NOW  Free Smoking Cessation Classes [phone]  CHOLESTEROL Know your levels; limit fat & cholesterol in your diet  Lipid Panel     Component Value Date/Time   CHOL 132 06/14/2014 0229   TRIG 37 06/14/2014 0229   HDL 57 06/14/2014 0229   CHOLHDL 2.3 06/14/2014 0229   VLDL 7 06/14/2014 0229   LDLCALC 68 06/14/2014 0229      Many patients benefit from  treatment even if their cholesterol is at goal.  Goal: Total Cholesterol (CHOL) less than 160  Goal:  Triglycerides (TRIG) less than 150  Goal:  HDL greater than 40  Goal:  LDL (LDLCALC) less than 100   BLOOD PRESSURE American Stroke Association blood pressure target is less that 120/80 mm/Hg  Your discharge blood pressure is:  BP: 141/92 mmHg  Monitor your blood pressure  Limit your salt and alcohol intake  Many individuals will require more than one medication for high blood pressure  DIABETES (A1c is a blood sugar average for last 3 months) Goal HGBA1c is under 7% (HBGA1c is blood sugar average for last 3 months)  Diabetes: No known diagnosis of diabetes    Lab Results  Component Value Date   HGBA1C 6.1* 06/14/2014     Your HGBA1c can be lowered with medications, healthy diet, and exercise.  Check your blood sugar as directed by your physician  Call your physician if you experience unexplained or low blood sugars.  PHYSICAL ACTIVITY/REHABILITATION Goal is 30 minutes at least 4 days per week  Activity: Increase activity slowly, Therapies: Physical Therapy: Home Health Return to work:   Activity decreases your risk of heart attack and stroke and makes your heart stronger.  It helps control your weight and blood pressure; helps you relax and can improve your mood.  Participate in a regular exercise program.  Talk with your doctor about the best form of exercise for you (dancing, walking, swimming, cycling).  DIET/WEIGHT Goal is to maintain a healthy weight  Your discharge diet is: Dysphagia  liquids Your height is:  Height: 5' (152.4 cm)  Your current weight is: Weight: 50.984 kg (112 lb 6.4 oz) Your Body Mass Index (BMI) is:  BMI (Calculated): 19.5  Following the type of diet specifically designed for you will help prevent another stroke.  Your goal weight range is:    Your goal Body Mass Index (BMI) is 19-24.  Healthy food habits can help reduce 3 risk factors for  stroke:  High cholesterol, hypertension, and excess weight.  RESOURCES Stroke/Support Group:  Call [phone]   STROKE EDUCATION PROVIDED/REVIEWED AND GIVEN TO PATIENT Stroke warning signs and symptoms How to activate emergency medical system (call 911). Medications prescribed at discharge. Need for follow-up after discharge. Personal risk factors for stroke. Pneumonia vaccine given:  Flu vaccine given:  My questions have been answered, the writing is legible, and I understand these instructions.  I will adhere to these goals & educational materials that have been provided to me after my discharge from the hospital.

## 2014-06-27 NOTE — Progress Notes (Signed)
Occupational Therapy Session Note  Patient Details  Name: Christy Miranda MRN: [id number] Date of Birth: June 10, 1921  Today's Date: 06/27/2014 Time: 0700-0800 Time Calculation (min): 60 min  Short Term Goals: Week 1:  OT Short Term Goal 1 (Week 1): Pt will initiate and complete UB bathing with no more than min assist and min instructional cueing in supported sitting. OT Short Term Goal 2 (Week 1): Pt will perform UB dressing with min assist in supported sitting. OT Short Term Goal 3 (Week 1): Pt will perform LB bathing with max assist sit to stand. OT Short Term Goal 4 (Week 1): Pt will maintain static sitting EOB with no more than min assist for 5 mins in preparation for selfcare tasks. OT Short Term Goal 5 (Week 1): Pt will use the RUE as a stabilizer with mod assist and mod instructional cueing to integrate into selfcare tasks.   Skilled Therapeutic Interventions/Progress Updates:    Pt asleep in bed upon arrival.  Pt required mod multimodal cues to arouse.  Pt became more alert when therapist initiated moving patient to side of bed.  Pt engaged in BADL retraining including bathing and dressing with sit<>stand at sink.  Pt initiated use of RUE throughout session but required min A to complete tasks with RUE.  Pt performed sit<>stand at sink with min A and was able to maintain standing balance at sink with min A to complete bathing buttocks and peri area.  Pt was able to thread LUE into shirt sleeve and initiated threading RUE into shirt sleeve but required assistance to complete task. Pt initiated theading BLE into pants but required assistance to complete tasks. Focus on activity tolerance, sit<>stand, transfers, standing balance, attention to right, increase use of RUE, and safety awareness.  Therapy Documentation Precautions:  Precautions Precautions: Fall Precaution Comments: Rt sided hemiparesis, aphasia, severe kyphosis/scoliosis, recent pelvic fx, right neglect Restrictions Weight  Bearing Restrictions: No LLE Weight Bearing: Weight bearing as tolerated General:   Pain: Pain Assessment Pain Assessment: No/denies pain  See FIM for current functional status  Therapy/Group: Individual Therapy  Leroy Libman 06/27/2014, 8:08 AM

## 2014-06-27 NOTE — Progress Notes (Signed)
80 y.o. right-handed female with history of atrial fibrillation status post pacemaker with chronic Coumadin therapy, recent fall with pelvic fractures and had recently been at skilled nursing facility secondary to pelvic fractures.. Admitted 06/13/2049 with right-sided weakness as well as aphasia. INR on admission of 1.35. Initial cranial CT scan negative for acute changes. Patient did receive TPA. Followup cranial CT scan showed no 3.6 cm left basal ganglia parenchymal hemorrhage consistent with hemorrhagic transformation of acute infarct. 5 mm rightward midline shift. Echocardiogram with ejection fraction of 55% no wall motion abnormalities. Carotid Dopplers with no ICA stenosis. Neurology services consulted with workup ongoing. Aspirin held for post TPA hemorrhage. Repeat cranial CT scan 06/19/2014 shows slight interval decrease in size of hematoma centered in the left putamen, measuring 30 x 28 mm  Subjective/Complaints:  Pt aphasic unable to verbalize complaints Initiating with RUE per OTA Apraxia noted Objective: Vital Signs: Blood pressure 141/92, pulse 89, temperature 98 F (36.7 C), temperature source Oral, resp. rate 18, height 5' (1.524 m), weight 50.984 kg (112 lb 6.4 oz), SpO2 97.00%. No results found. No results found for this or any previous visit (from the past 72 hour(s)).    Constitutional:  80 year old frail white female. Head forward posture and leaning to the right. Eyes:  Pupils reactive to light  Neck: Normal range of motion. Neck supple. No thyromegaly present.  Cardiovascular:  Irreg Irreg. No murmur  Respiratory: Effort normal and breath sounds normal. No respiratory distress.  GI: Soft. Bowel sounds are normal. She exhibits no distension. Non-tender Neurological:  Patient is alert. Left gaze preference. She was expressively and receptively aphasic with apraxia. She did follow a few simple commands. Made eye contact when cued.RUE: 2-/5 deltoid, bicep, tricep, 3-/5  finger flex. RLE: 3- HF, KE, and 2- ADF/PF but inconsistent.  Increased tone R biceps MAS2 Skin: Skin is warm and dry  Psych: flat, without agitation Ext- neg edema  Assessment/Plan: 1. Functional deficits secondary to  left basal ganglia parenchymal hemorrhage consistent with hemorrhagic transformation of acute infarct which require 3+ hours per day of interdisciplinary therapy in a comprehensive inpatient rehab setting. Physiatrist is providing close team supervision and 24 hour management of active medical problems listed below. Physiatrist and rehab team continue to assess barriers to discharge/monitor patient progress toward functional and medical goals.  FIM: FIM - Bathing Bathing Steps Patient Completed: Chest;Right Arm;Abdomen;Front perineal area;Left upper leg Bathing: 3: Mod-Patient completes 5-7 75f 10 parts or 50-74%  FIM - Upper Body Dressing/Undressing Upper body dressing/undressing steps patient completed: Thread/unthread left sleeve of pullover shirt/dress Upper body dressing/undressing: 2: Max-Patient completed 25-49% of tasks FIM - Lower Body Dressing/Undressing Lower body dressing/undressing steps patient completed: Thread/unthread left pants leg Lower body dressing/undressing: 1: Total-Patient completed less than 25% of tasks  FIM - Musician Devices: Grab bar or rail for support Toileting: 1: Total-Patient completed zero steps, helper did all 3  FIM - Radio producer Devices: Grab bars Toilet Transfers: 2-To toilet/BSC: Max A (lift and lower assist);2-From toilet/BSC: Max A (lift and lower assist)  FIM - Engineer, site Assistive Devices: HOB elevated;Bed rails;Arm rests Bed/Chair Transfer: 2: Bed > Chair or W/C: Max A (lift and lower assist);2: Chair or W/C > Bed: Max A (lift and lower assist);3: Supine > Sit: Mod A (lifting assist/Pt. 50-74%/lift 2 legs  FIM - Locomotion:  Wheelchair Locomotion: Wheelchair: 1: Total Assistance/staff pushes wheelchair (Pt<25%) FIM - Locomotion: Ambulation Locomotion: Ambulation Assistive Devices: Other (comment) (  L rail in hall) Ambulation/Gait Assistance: 3: Mod assist Locomotion: Ambulation: 1: Travels less than 50 ft with moderate assistance (Pt: 50 - 74%)  Comprehension Comprehension Mode: Auditory Comprehension: 2-Understands basic 25 - 49% of the time/requires cueing 51 - 75% of the time  Expression Expression Mode: Verbal Expression: 1-Expresses basis less than 25% of the time/requires cueing greater than 75% of the time.  Social Interaction Social Interaction: 2-Interacts appropriately 25 - 49% of time - Needs frequent redirection.  Problem Solving Problem Solving: 1-Solves basic less than 25% of the time - needs direction nearly all the time or does not effectively solve problems and may need a restraint for safety  Memory Memory: 1-Recognizes or recalls less than 25% of the time/requires cueing greater than 75% of the time  Medical Problem List and Plan:  1. 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 No aspirin at this time repeat CT scan in one week to decide upon initiation of aspirin  2. DVT Prophylaxis/Anticoagulation: SCDs. Monitor for any signs of DVT  3. Pain Management: Tylenol as needed  4. Mood/anxiety. Xanax 0.25 mg 3 times a day as needed. Provide emotional support:  5. Neuropsych: This patient is not capable of making decisions on her own behalf.   6. Dysphagia. Dysphagia 1 with honey thick liquids. Continue IV fluids for hydration. Followup chemistries  7. History of atrial fibrillation/pacemaker with chronic Coumadin. Coumadin held secondary to hemorrhagic transformation x 3-4 wk.  To f/u with Neuro post D/C to determine restart date on warfarin Cardiac rate controlled. Continue Toprol 50 mg twice a day for rate control  8. Recent pelvic fracture.  Weightbearing as tolerated. No pain with LE ROM 9. Hypothyroidism. Synthroid 10.  urinary incont, UA neg, trial anticholinergic  LOS (Days) 8 A FACE TO FACE EVALUATION WAS PERFORMED  Kyzen Horn E 06/27/2014, 7:52 AM

## 2014-06-27 NOTE — Progress Notes (Signed)
Physical Therapy Session Note  Patient Details  Name: Christy Miranda MRN: [id number] Date of Birth: 07-22-1921  Today's Date: 06/27/2014 Time: 0488-8916 Time Calculation (min): 30 min  Short Term Goals: Week 1:  PT Short Term Goal 1 (Week 1): Pt will roll R and L with min A with use of bedrails PT Short Term Goal 2 (Week 1): Pt will perform squat pivot transfer at max A level to the R or L PT Short Term Goal 3 (Week 1): Pt will perform dymamic sitting at mod A level PT Short Term Goal 4 (Week 1): Pt will perform sit<>stand at max A level PT Short Term Goal 5 (Week 1): Pt will self propel w/c using L hemi technique at max A level  Skilled Therapeutic Interventions/Progress Updates:    Pt received semi reclined in bed; asleep but easily awakened. Agreeable to therapy with min coaxing. Pt performed supine>sit with mod A with HOB elevated 20 degrees using rail; tactile cueing at bilat LE's for initiation of movement toward EOB. Pt performed squat pivot from bed>w/c with min A and from w/c>bed with mod A, manual placement of RLE. Adjusted Roho w/c cushion to address overinflation. Mirror effective in increasing postural awareness during final 50% of gait trial. Transported pt to hallway, where pt performed gait x30' in controlled environment with L rail and min A; verbal/demonstration cueing for increased RLE step length with effective carryover throughout remainder of trial. Returned to room, where pt performed sit>supine with HOB flat, increased time, and min A for R LE management. When scooting to [facility], pt required mod A and tactile cueing at R knee for increased weightbearing. Therapist departed with pt semi reclined in bed with 3 bed rails up, bed alarm on, and all needs within reach.  Therapy Documentation Precautions:  Precautions Precautions: Fall Precaution Comments: Rt sided hemiparesis, aphasia, severe kyphosis/scoliosis, recent pelvic fx, right neglect Restrictions Weight Bearing  Restrictions: No LLE Weight Bearing: Weight bearing as tolerated Pain: Pain Assessment Pain Assessment: No/denies pain Locomotion : Ambulation Ambulation/Gait Assistance: 4: Min assist   See FIM for current functional status  Therapy/Group: Individual Therapy  Stefano Gaul 06/27/2014, 6:53 PM

## 2014-06-27 NOTE — Progress Notes (Signed)
Social Work Patient ID: Jamy Whyte, female   DOB: 02/07/1921, 80 y.o.   MRN: [id number]  CSW met with pt's dtr on 06-26-14 to discuss d/c planning and then via telephone today.  Dtr had questions about private duty agencies/care and about SNF care.  CSW answered these questions and provided support to dtr.  CSW visited with pt, although it is very difficult for pt to express her thoughts and CSW had difficulty understanding her.  CSW will continue to follow and assist with d/c planning.

## 2014-06-27 NOTE — Progress Notes (Signed)
INITIAL NUTRITION ASSESSMENT  DOCUMENTATION CODES Per approved criteria  -Not Applicable   INTERVENTION: - Ensure Pudding po BID, each supplement provides 170 kcal and 4 grams of protein. - Continue to encourage adequate PO intake. - Recommend that pt be re-weighed for accuracy.  - RD will continue to monitor.  NUTRITION DIAGNOSIS: Inadequate oral intake related to pelvic fractures and CVA as evidenced by suspected weight loss.   Goal: Pt to meet >/= 90% of their estimated nutrition needs   Monitor:  Weight trends,   Reason for Assessment: MST  80 y.o. female  Admitting Dx: <principal problem not specified>  ASSESSMENT: 80 y.o. right-handed female with history of atrial fibrillation status post pacemaker with chronic Coumadin therapy, recent fall with pelvic fractures and had recently been at skilled nursing facility secondary to pelvic fractures.. Admitted 06/13/2049 with right-sided weakness as well as aphasia.  - Pt was difficult to obtain history from. Per Nurse Tech, pt had an egg for breakfast and ate 100%. Meal completion is recorded as 100%. Pt's weight has been trending down since April 2015, but suspect that most recent weight is incorrect. Pt is on a Dysphagia 1 diet with honey-thickened liquids. Pt with no signs of significant fat and/or muscle wasting.   K 3.6  Height: Ht Readings from Last 1 Encounters:  06/19/14 5' (1.524 m)    Weight: Wt Readings from Last 1 Encounters:  06/21/14 112 lb 6.4 oz (50.984 kg)    Ideal Body Weight: 54.5 kg  % Ideal Body Weight: 94%  Wt Readings from Last 10 Encounters:  06/21/14 112 lb 6.4 oz (50.984 kg)  06/13/14 89 lb (40.37 kg)  05/22/14 98 lb 12.3 oz (44.8 kg)  03/13/14 105 lb (47.628 kg)  12/19/13 105 lb 6.4 oz (47.809 kg)  11/28/13 103 lb 9.6 oz (46.993 kg)  11/14/13 104 lb 6.4 oz (47.356 kg)  10/21/13 105 lb 12.8 oz (47.991 kg)  08/25/13 105 lb 12.8 oz (47.991 kg)  07/22/13 104 lb (47.174 kg)    Usual  Body Weight: unknown  % Usual Body Weight: n/a  BMI:  Body mass index is 21.95 kg/(m^2).  Estimated Nutritional Needs: Kcal: 1200-1400 Protein: 75-85 g Fluid: >1.4 L/day  Skin: WNL  Diet Order: Dysphagia  EDUCATION NEEDS: -No education needs identified at this time   Intake/Output Summary (Last 24 hours) at 06/27/14 1119 Last data filed at 06/27/14 0800  Gross per 24 hour  Intake    240 ml  Output      4 ml  Net    236 ml    Last BM: 7/28   Labs:  No results found for this basename: NA, K, CL, CO2, BUN, CREATININE, CALCIUM, MG, PHOS, GLUCOSE,  in the last 168 hours  CBG (last 3)  No results found for this basename: GLUCAP,  in the last 72 hours  Scheduled Meds: . antiseptic oral rinse  15 mL Mouth Rinse q12n4p  . chlorhexidine  15 mL Mouth Rinse BID  . levothyroxine  50 mcg Oral QAC breakfast  . metoprolol tartrate  50 mg Oral BID  . oxybutynin  5 mg Oral QHS  . pantoprazole sodium  40 mg Per Tube QHS    Continuous Infusions:   Past Medical History  Diagnosis Date  . HTN (hypertension)   . Permanent atrial fibrillation   . Bradycardia     s/p PPM  . Hypothyroidism   . Anxiety   . Diverticulitis     history of  .  History of hysterectomy   . Long-term (current) use of anticoagulants   . Scoliosis (and kyphoscoliosis), idiopathic   . DVT (deep vein thrombosis) in pregnancy     right peroneal  . Dizziness and giddiness 07/22/2013    Past Surgical History  Procedure Laterality Date  . Pacemaker insertion  03/03/2006    most recent generator (MDT) change 10/30/06 by Dr Verlon Setting  . Cardiac catheterization  01/08/1999    normal LV function  . Cardioversion  10/30/2006    Successful elective DC cardioversion  . Cardioversion  12/29/2002    Successful DC cardioversion  . Cardioversion  12/03/1999    Successful DC cardioversion  . Cholecystectomy      Terrace Arabia RD, LDN

## 2014-06-28 ENCOUNTER — Encounter (HOSPITAL_COMMUNITY): Payer: Medicare Other | Admitting: Occupational Therapy

## 2014-06-28 ENCOUNTER — Inpatient Hospital Stay (HOSPITAL_COMMUNITY): Payer: Medicare Other | Admitting: Physical Therapy

## 2014-06-28 ENCOUNTER — Inpatient Hospital Stay (HOSPITAL_COMMUNITY): Payer: Medicare Other | Admitting: Speech Pathology

## 2014-06-28 ENCOUNTER — Encounter (HOSPITAL_COMMUNITY): Payer: Self-pay | Admitting: *Deleted

## 2014-06-28 LAB — BASIC METABOLIC PANEL
ANION GAP: 15 (ref 5–15)
BUN: 13 mg/dL (ref 6–23)
CHLORIDE: 99 meq/L (ref 96–112)
CO2: 25 meq/L (ref 19–32)
Calcium: 9 mg/dL (ref 8.4–10.5)
Creatinine, Ser: 0.39 mg/dL — ABNORMAL LOW (ref 0.50–1.10)
GFR calc Af Amer: >90 mL/min (ref >90)
GFR calc non Af Amer: 89 mL/min — ABNORMAL LOW (ref >90)
GLUCOSE: 129 mg/dL — AB (ref 70–99)
Potassium: 4.3 mEq/L (ref 3.7–5.3)
SODIUM: 139 meq/L (ref 137–147)

## 2014-06-28 NOTE — Progress Notes (Signed)
Physical Therapy Session Note  Patient Details  Name: Christy Miranda MRN: [id number] Date of Birth: August 30, 1921  Today's Date: 06/28/2014 Time: 1300-1315 Time Calculation (min): 15 min  Short Term Goals: Week 1:  PT Short Term Goal 1 (Week 1): Pt will roll R and L with min A with use of bedrails PT Short Term Goal 2 (Week 1): Pt will perform squat pivot transfer at max A level to the R or L PT Short Term Goal 3 (Week 1): Pt will perform dymamic sitting at mod A level PT Short Term Goal 4 (Week 1): Pt will perform sit<>stand at max A level PT Short Term Goal 5 (Week 1): Pt will self propel w/c using L hemi technique at max A level  Skilled Therapeutic Interventions/Progress Updates:    Pt received sitting in w/c, looking at and touching bed, shaking head and saying "no" upon seeing therapist walking into room. Pt repeatedly saying "no" when therapist asking patient to walk before returning to bed. With prompting, patient reporting "yes" that she would like to get into bed, no cues for initiation required for transfer. Pt performed stand pivot transfer w/c > bed with mod A and transferred sit > supine with min A for RLE. Pt able to reposition self higher in bed with use of L rail and bridging through LEs, supervision. Pt looking to L side of room and therapist attempting to close blinds, pt reporting "no" and looking at bed rail, pt requesting 1 bed rail to be raised. Will f/u per POC. Patient missed 45 min due to fatigue.    W/c goal discharged, balance and transfer goals upgraded from mod to min A and ambulation goals added due to functional progress with mobility this week.    Therapy Documentation Precautions:  Precautions Precautions: Fall Precaution Comments: Rt sided hemiparesis, aphasia, severe kyphosis/scoliosis, recent pelvic fx, right neglect Restrictions Weight Bearing Restrictions: No LLE Weight Bearing: Weight bearing as tolerated General: Amount of Missed PT Time (min): 45  Minutes Missed Time Reason: Patient fatigue Pain:  Denied pain  See FIM for current functional status  Therapy/Group: Individual Therapy  Laretta Alstrom 06/28/2014, 1:19 PM

## 2014-06-28 NOTE — Progress Notes (Signed)
Occupational Therapy Weekly Progress Note & Session Note  Patient Details  Name: Christy Miranda MRN: [id number] Date of Birth: 1921/12/01  WEEKLY PROGRESS NOTE  Beginning of progress report period: June 19, 2014 End of progress report period: June 28, 2014  Today's Date: 06/28/2014  Patient has met 5 of 5 short term goals. Patient is making slow, steady progress on CIR. Patient is min assist with UB ADLs, max>total with LB ADLs & toileting needs (including clothing management & peri care), and fluctuates between mod>max assist with functional transfers. Per SW, during conference, team has decided to extend patient's LOS>8/07 and plan is for patient d/c>home with hired caregiver assistance. Patient will continue to benefit from extensive CIR therapy, continue plan of care for now; will modify goals as needed if appropriate.    Patient continues to demonstrate the following deficits: decreased initiation, decreased problem solving, decreased functional communication, decreased independence with ADL, decreased independence with functional mobility/transfers/ambulation, decreased overall activity tolerance/endurance. Therefore, patient will continue to benefit from skilled OT intervention to enhance overall performance with BADL, iADL and Reduce care partner burden.  Patient progressing toward long term goals..  Continue plan of care.  OT Short Term Goals Week 1:  OT Short Term Goal 1 (Week 1): Pt will initiate and complete UB bathing with no more than min assist and min instructional cueing in supported sitting. OT Short Term Goal 1 - Progress (Week 1): Met OT Short Term Goal 2 (Week 1): Pt will perform UB dressing with min assist in supported sitting. OT Short Term Goal 2 - Progress (Week 1): Met OT Short Term Goal 3 (Week 1): Pt will perform LB bathing with max assist sit to stand. OT Short Term Goal 3 - Progress (Week 1): Met OT Short Term Goal 4 (Week 1): Pt will maintain static sitting EOB  with no more than min assist for 5 mins in preparation for selfcare tasks. OT Short Term Goal 4 - Progress (Week 1): Met OT Short Term Goal 5 (Week 1): Pt will use the RUE as a stabilizer with mod assist and mod instructional cueing to integrate into selfcare tasks.  OT Short Term Goal 5 - Progress (Week 1): Met  Week 2:  OT Short Term Goal 1 (Week 2): Short Term Goals = Long Term Goals secondary to ELOS  Skilled Therapeutic Interventions/Progress Updates:  Balance/vestibular training;DME/adaptive equipment instruction;Community reintegration;Functional electrical stimulation;Discharge planning;Functional mobility training;Psychosocial support;Therapeutic Activities;Visual/perceptual remediation/compensation;UE/LE Coordination activities;Splinting/orthotics;Patient/family education;Pain management;Skin care/wound managment;UE/LE Strength taining/ROM;Therapeutic Exercise;Self Care/advanced ADL retraining;Neuromuscular re-education;Disease mangement/prevention;Wheelchair propulsion/positioning;Cognitive remediation/compensation   Precautions:  Precautions Precautions: Fall Precaution Comments: Rt sided hemiparesis, aphasia, severe kyphosis/scoliosis, recent pelvic fx, right neglect Restrictions Weight Bearing Restrictions: No LLE Weight Bearing: Weight bearing as tolerated  See FIM for current functional status  -------------------------------------------------------------------------------------------------------------  SESSION NOTE 1003-1103 - 60 Minutes Individual Therapy Patient did not verbalize or so any signs of pain Patient received supine in bed with request to use bathroom. Therapist donned bilateral TEDs and socks. Patient engaged in bed mobility with mod assist, sat EOB, then transferred > drop arm BSC. Patient with incontinence in brief. Therapist assisted with cleaning of buttock, patient able to wash bilateral thighs and peri area while seated on BSC. From here, patient  transferred back to EOB for UB bathing & dressing tasks. Patient completed both UB bathing & dressing with min assist and min verbal cues for initiation & problem solving. Patient transferred EOB>w/c and performed LB dressing in sit<>stand position from w/c at sink level. Grooming tasks of  applying makeup and combing hair also completed at sink; patient set-up>supervision for grooming tasks. Focused skilled intervention on ADL retraining, functional transfers, dynamic sitting balance/tolerance/endurance, sit<>stands, static & dynamic standing balance/tolerance/endurance, positioning on bed & in w/c, initiation, problem solving, communication, and overall activity tolerance/endurance. Patient incorporating use of RUE during ADL session. Patient performing transfers at an overall mod>max assist level. At end of session, left patient seated in w/c with all needs within reach & quick release belt donned.   Asir Bingley 06/28/2014, 10:31 AM

## 2014-06-28 NOTE — Patient Care Conference (Signed)
Inpatient RehabilitationTeam Conference and Plan of Care Update Date: 06/28/2014   Time: 11:30 AM    Patient Name: Christy Miranda      Medical Record Number: [id number]  Date of Birth: 1920/12/03 Sex: Female         Room/Bed: 4W17C/4W17C-01 Payor Info: Payor: MEDICARE / Plan: MEDICARE PART A AND B / Product Type: *No Product type* /    Admitting Diagnosis: L CVA  Admit Date/Time:  06/19/2014  6:51 PM Admission Comments: No comment available   Primary Diagnosis:  <principal problem not specified> Principal Problem: <principal problem not specified>  Patient Active Problem List   Diagnosis Date Noted  . CVA (cerebral infarction) 06/19/2014  . Undernutrition 06/19/2014  . Fracture of multiple pubic rami 05/22/2014  . Encounter for therapeutic drug monitoring 01/02/2014  . Dizziness and giddiness 07/22/2013  . Thrombocytopenia 11/16/2012  . Deep venous thrombosis of lower leg 11/13/2012  . Subtherapeutic international normalized ratio (INR) 11/13/2012  . Pancytopenia 11/13/2012  . Edema 11/07/2012  . Pacemaker-Medtronic 08/19/2012  . Current use of long term anticoagulation 08/08/2011  . Atrial fibrillation 02/28/2011  . Tachycardia-bradycardia syndrome 02/28/2011  . Hypertension 02/28/2011    Expected Discharge Date: Expected Discharge Date: 07/07/14  Team Members Present: Physician leading conference: Dr. Alysia Penna Social Worker Present: Christy Alpers, LCSW Nurse Present: Other (comment) Christy Mates, RN) PT Present: Christy Miranda, PT OT Present: Christy Miranda, OT SLP Present: Christy Miranda, SLP PPS Coordinator present : Christy Nakayama, RN, CRRN;Christy Miranda, PT     Current Status/Progress Goal Weekly Team Focus  Medical   No change with po restrictions  adequate fluid intake  recheck swallow   Bowel/Bladder   incont. bowel and bladder, LBM 06/26/14  Managed bowel and bladder   Timed toileting during the day   Swallow/Nutrition/ Hydration   dys 1 textures,  honey thick liquids via teaspoon with min-mod assist   least restrictive PO with Min assist   dys 2 trials, carryover of swallowing precautions   ADL's   Patient overall mod>max assist with functional transfers (stand pivot technique), max>total for LB self-care tasks & toileting needs, min assist for UB ADLs  overall mod assist for LB selfcare and functional transfers except for shower transfers which are set at max assist level currently.  ADL retraining, initiation, problem solving, functional communication, positioning, neuro re-ed, pt/family education, functional transfers, sit<>stands, functional mobility   Mobility   mod A for squat pivot/stand pivot transfers, min-mod A standing and gait up to 30 ft in hall with L rail, mod-max A bed mobility     mod A transfers, min A w/c level (may upgrade/add ambulation goal)  bed mobility, transfers, gait, standing balance, w/c positioning   Communication   max-total assist for receptive and expressive  max assist   automatic sequences, receptive naming, recognition of objects    Safety/Cognition/ Behavioral Observations  max-total assist   max assist   increase awareness of perseveration and timely cessation of tasks    Pain   No c/o pain   Pain <3  Assess for and treat pain q shift as needed   Skin   Scab to L ankle, bursing to R leg, L eye , and R arm  NO ski n breakdown  Assess skin q shift for breakdown    Rehab Goals Patient on target to meet rehab goals: Yes Rehab Goals Revised: Pt's PT goals upgraded to min assist *See Care Plan and progress notes for long and short-term goals.  Barriers  to Discharge: needs IV    Possible Resolutions to Barriers:  ? upgrade diet and  trial off IVF    Discharge Planning/Teaching Needs:  Family is still deciding if pt will go home with family and paid caregivers or go to a SNF.  Dtr works 3 days a week and can be here for family education on Mondays and Fridays.  Team Discussion:  Pt is doing well  medically.  She ambulated 47' with using left handrail and PT hopes to try walking with the walker.  Pt is min-max squat pivot tx.  ST is still focusing on swallowing and advancing pt's diet.  She will have a repeat MBS soon.  Pt is following commands well and using right arm more.  If the plan is for pt to go home, dtr and caregivers to start family education at the beginning of next week.  Revisions to Treatment Plan:  PT goals upgraded and pt's stay extended to 07-07-14 to continue progressing pt.   Continued Need for Acute Rehabilitation Level of Care: The patient requires daily medical management by a physician with specialized training in physical medicine and rehabilitation for the following conditions: Daily direction of a multidisciplinary physical rehabilitation program to ensure safe treatment while eliciting the highest outcome that is of practical value to the patient.: Yes Daily medical management of patient stability for increased activity during participation in an intensive rehabilitation regime.: Yes Daily analysis of laboratory values and/or radiology reports with any subsequent need for medication adjustment of medical intervention for : Neurological problems;Other  Christy Miranda, Christy Miranda 06/28/2014, 2:11 PM

## 2014-06-28 NOTE — Progress Notes (Signed)
Speech Language Pathology Daily Session Note  Patient Details  Name: Christy Miranda MRN: [id number] Date of Birth: [birth year]  Today's Date: 06/28/2014 Time: 1420-1520 Time Calculation (min): 60 min  Short Term Goals: Week 2: SLP Short Term Goal 1 (Week 2): Patient will consume trials of advanced consistencies with no overt s/s of aspiration and min cuing for use of swallowing precaution for diet upgrade. SLP Short Term Goal 2 (Week 2): Patient will participate in automatic verbal tasks wtih Max verbal and written cues. SLP Short Term Goal 3 (Week 2): Patient will identify named object from a field of 2 with Mod multimodal cues. SLP Short Term Goal 4 (Week 2): Patient will solve basic problems related to self care with Mod multiodal cues.  SLP Short Term Goal 5 (Week 2): Patient will sustain attention to basic self-care tasks for 3-5 minutes with Mod cues for redirection.  Skilled Therapeutic Interventions:  Pt was seen for skilled speech therapy targeting automatic verbal tasks, naming, and sustained attention.  Upon arrival, pt was partially reclined in bed and initially attempted to refuse SLP, but was agreeable to participate in bedside therapy.   SLP facilitated session with structured naming tasks for automatic sequences with pt exhibiting ~75% accuracy for counting from 1-10 with mod-max assist, >25% accuracy for naming the days of the week.  Additionally, pt was noted to produce approximations of targeted words during structured melodic intonation tasks targeting improved verbal expression for ~25% accuracy over 4 trials when singing in unison with SLP.  During confrontational naming tasks of basic objects, pt was 50-75% accurate with max assist and written aids. Additionally, pt was >25% accurate for matching object to word from a field of two despite max assist multimodal cues.  Continue per current plan of care.      FIM:  Comprehension Comprehension Mode: Auditory Comprehension:  2-Understands basic 25 - 49% of the time/requires cueing 51 - 75% of the time Expression Expression Mode: Verbal Expression: 1-Expresses basis less than 25% of the time/requires cueing greater than 75% of the time. Social Interaction Social Interaction: 2-Interacts appropriately 25 - 49% of time - Needs frequent redirection. Problem Solving Problem Solving: 1-Solves basic less than 25% of the time - needs direction nearly all the time or does not effectively solve problems and may need a restraint for safety Memory Memory: 1-Recognizes or recalls less than 25% of the time/requires cueing greater than 75% of the time  Pain Pain Assessment Pain Assessment: No/denies pain Pain Score: 0-No pain  Therapy/Group: Individual Therapy  Windell Moulding, M.A. CCC-SLP  Walker Sitar, Selinda Orion 06/28/2014, 4:31 PM

## 2014-06-28 NOTE — Progress Notes (Addendum)
80 y.o. right-handed female with history of atrial fibrillation status post pacemaker with chronic Coumadin therapy, recent fall with pelvic fractures and had recently been at skilled nursing facility secondary to pelvic fractures.. Admitted 06/13/2049 with right-sided weakness as well as aphasia. INR on admission of 1.35. Initial cranial CT scan negative for acute changes. Patient did receive TPA. Followup cranial CT scan showed no 3.6 cm left basal ganglia parenchymal hemorrhage consistent with hemorrhagic transformation of acute infarct. 5 mm rightward midline shift. Echocardiogram with ejection fraction of 55% no wall motion abnormalities. Carotid Dopplers with no ICA stenosis. Neurology services consulted with workup ongoing. Aspirin held for post TPA hemorrhage. Repeat cranial CT scan 06/19/2014 shows slight interval decrease in size of hematoma centered in the left putamen, measuring 30 x 28 mm  Subjective/Complaints:  Pt aphasic unable to verbalize complaints Initiating with RUE per OTA Apraxia noted Objective: Vital Signs: Blood pressure 140/79, pulse 78, temperature 98.1 F (36.7 C), temperature source Oral, resp. rate 17, height 5' (1.524 m), weight 50.984 kg (112 lb 6.4 oz), SpO2 94.00%. No results found. No results found for this or any previous visit (from the past 72 hour(s)).    Constitutional:  80 year old frail white female. Head forward posture and leaning to the right. Eyes:  Pupils reactive to light  Neck: Normal range of motion. Neck supple. No thyromegaly present.  Cardiovascular:  Irreg Irreg. No murmur  Respiratory: Effort normal and breath sounds normal. No respiratory distress.  GI: Soft. Bowel sounds are normal. She exhibits no distension. Non-tender Neurological:  Patient is alert. Left gaze preference. She was expressively and receptively aphasic with apraxia. She did follow a few simple commands. Made eye contact when cued.RUE: 2-/5 deltoid, bicep, tricep, 3-/5  finger flex. RLE: 3- HF, KE, and 2- ADF/PF but inconsistent.  Increased tone R biceps MAS2 Skin: Skin is warm and dry  Psych: flat, without agitation Ext- neg edema  Assessment/Plan: 1. Functional deficits secondary to  left basal ganglia parenchymal hemorrhage consistent with hemorrhagic transformation of acute infarct which require 3+ hours per day of interdisciplinary therapy in a comprehensive inpatient rehab setting. Physiatrist is providing close team supervision and 24 hour management of active medical problems listed below. Physiatrist and rehab team continue to assess barriers to discharge/monitor patient progress toward functional and medical goals. Team conference today please see physician documentation under team conference tab, met with team face-to-face to discuss problems,progress, and goals. Formulized individual treatment plan based on medical history, underlying problem and comorbidities.  FIM: FIM - Bathing Bathing Steps Patient Completed: Chest;Right Arm;Abdomen;Front perineal area;Buttocks;Left upper leg Bathing: 3: Mod-Patient completes 5-7 70f10 parts or 50-74%  FIM - Upper Body Dressing/Undressing Upper body dressing/undressing steps patient completed: Thread/unthread left sleeve of pullover shirt/dress;Put head through opening of pull over shirt/dress Upper body dressing/undressing: 3: Mod-Patient completed 50-74% of tasks FIM - Lower Body Dressing/Undressing Lower body dressing/undressing steps patient completed: Thread/unthread left pants leg Lower body dressing/undressing: 1: Total-Patient completed less than 25% of tasks  FIM - TMusicianDevices: Grab bar or rail for support Toileting: 1: Total-Patient completed zero steps, helper did all 3  FIM - TRadio producerDevices: Grab bars Toilet Transfers: 2-To toilet/BSC: Max A (lift and lower assist);2-From toilet/BSC: Max A (lift and lower assist)  FIM - BPensions consultantAssistive Devices: HOB elevated;Bed rails;Arm rests Bed/Chair Transfer: 3: Chair or W/C > Bed: Mod A (lift or lower assist);4: Bed > Chair or W/C:  Min A (steadying Pt. > 75%);3: Supine > Sit: Mod A (lifting assist/Pt. 50-74%/lift 2 legs;4: Sit > Supine: Min A (steadying pt. > 75%/lift 1 leg)  FIM - Locomotion: Wheelchair Locomotion: Wheelchair: 1: Total Assistance/staff pushes wheelchair (Pt<25%) FIM - Locomotion: Ambulation Locomotion: Ambulation Assistive Devices: Other (comment) (L rail) Ambulation/Gait Assistance: 4: Min assist Locomotion: Ambulation: 1: Travels less than 50 ft with minimal assistance (Pt.>75%)  Comprehension Comprehension Mode: Auditory Comprehension: 2-Understands basic 25 - 49% of the time/requires cueing 51 - 75% of the time  Expression Expression Mode: Verbal Expression: 1-Expresses basis less than 25% of the time/requires cueing greater than 75% of the time.  Social Interaction Social Interaction: 2-Interacts appropriately 25 - 49% of time - Needs frequent redirection.  Problem Solving Problem Solving: 1-Solves basic less than 25% of the time - needs direction nearly all the time or does not effectively solve problems and may need a restraint for safety  Memory Memory: 1-Recognizes or recalls less than 25% of the time/requires cueing greater than 75% of the time  Medical Problem List and Plan:  1. 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 No aspirin at this time repeat CT scan in one week to decide upon initiation of aspirin  2. DVT Prophylaxis/Anticoagulation: SCDs. Monitor for any signs of DVT  3. Pain Management: Tylenol as needed  4. Mood/anxiety. Xanax 0.25 mg 3 times a day as needed. Provide emotional support:  5. Neuropsych: This patient is not capable of making decisions on her own behalf.   6. Dysphagia. Dysphagia 1 with honey thick liquids. Continue IV fluids for  hydration. Followup chemistries  7. History of atrial fibrillation/pacemaker with chronic Coumadin. Coumadin held secondary to hemorrhagic transformation x 3-4 wk.  To f/u with Neuro post D/C to determine restart date on warfarin Cardiac rate controlled. Continue Toprol 50 mg twice a day for rate control  8. Recent pelvic fracture. Weightbearing as tolerated. No pain with LE ROM 9. Hypothyroidism. Synthroid 10.  urinary incont, UA neg, trial anticholinergic  LOS (Days) 9 A FACE TO FACE EVALUATION WAS PERFORMED  KIRSTEINS,ANDREW E 06/28/2014, 7:49 AM

## 2014-06-29 ENCOUNTER — Inpatient Hospital Stay (HOSPITAL_COMMUNITY): Payer: Medicare Other | Admitting: Occupational Therapy

## 2014-06-29 ENCOUNTER — Inpatient Hospital Stay (HOSPITAL_COMMUNITY): Payer: Medicare Other | Admitting: Speech Pathology

## 2014-06-29 ENCOUNTER — Encounter (HOSPITAL_COMMUNITY): Payer: Medicare Other

## 2014-06-29 ENCOUNTER — Inpatient Hospital Stay (HOSPITAL_COMMUNITY): Payer: Medicare Other

## 2014-06-29 ENCOUNTER — Inpatient Hospital Stay (HOSPITAL_COMMUNITY): Payer: Medicare Other | Admitting: Physical Therapy

## 2014-06-29 DIAGNOSIS — I4891 Unspecified atrial fibrillation: Secondary | ICD-10-CM

## 2014-06-29 DIAGNOSIS — Z5189 Encounter for other specified aftercare: Secondary | ICD-10-CM

## 2014-06-29 DIAGNOSIS — I10 Essential (primary) hypertension: Secondary | ICD-10-CM

## 2014-06-29 DIAGNOSIS — I6330 Cerebral infarction due to thrombosis of unspecified cerebral artery: Secondary | ICD-10-CM

## 2014-06-29 NOTE — Progress Notes (Signed)
Social Work Patient ID: Christy Miranda, female   DOB: 1921-04-08, 80 y.o.   MRN: [id number]  CSW spoke with pt to update her on team conference discussion and informed her of extension of her stay to 07-07-14.  Pt tried to tell CSW something, but it was difficult to understand.  Pt kept repeating "I don't know."  CSW told her that CSW would update her dtr, as well, and pt nodded in agreement.  CSW spoke with dtr, Bethena Roys, to update her on team conference and extension of stay.  She was pleased for pt to have more time on CIR as she continues to be concerned about pt's swallowing/diet and also her continued difficulty with communication and with toileting.  CSW told dtr that pt's goals have been upgraded to minimal assist from mod assist and she was happy with that news.  In light of pt's progress, Bethena Roys will need to re-evaluate pt's plan for d/c.  Pt may be easier to manage at home than what it appeared based on last week's assessment.  Dtr may opt for short term SNF to continue to work on the above issues and then home with paid caregivers.  CSW and dtr to talk by the end of the week re: dtr's decision on d/c disposition.  CSW will then begin to set pt's d/c plan into motion and assist as needed.

## 2014-06-29 NOTE — Progress Notes (Signed)
Occupational Therapy Session Note  Patient Details  Name: Christy Miranda MRN: [id number] Date of Birth: [birth year]  Today's Date: 06/29/2014 Time: 0800-0903 Time Calculation (min): 63 min  Short Term Goals: Week 2:  OT Short Term Goal 1 (Week 2): Short Term Goals = Long Term Goals secondary to ELOS  Skilled Therapeutic Interventions/Progress Updates Patient resting in bed upon arrival.  Engaged in bed mobility, bed>BSC squat pivot transfer, toileting, BSC>w/c, sponge bath and dressing seated in w/c at sink to include sit and stand.   Focused session on sit><stand, standing tolerance and balance, visually attending to the right visual field and to the right side of her body, and hemi dressing.  Patient requires increased time for tasks and mod-max cues for attending to the right.  Therapy Documentation Precautions:  Precautions Precautions: Fall Precaution Comments: Rt sided hemiparesis, aphasia, severe kyphosis/scoliosis, recent pelvic fx, right neglect Restrictions Weight Bearing Restrictions: No LLE Weight Bearing: Weight bearing as tolerated Pain: Denies pain ADL: See FIM for current functional status  Therapy/Group: Individual Therapy  Fabrizzio  06/29/2014, 3:19 PM

## 2014-06-29 NOTE — Progress Notes (Signed)
Physical Therapy Weekly Progress Note  Patient Details  Name: Fayne Mcguffee MRN: [id number] Date of Birth: 08-30-1921  Beginning of progress report period: June 20, 2014 End of progress report period: June 29, 2014  Today's Date: 06/29/2014 Time: 1401-1501 Time Calculation (min): 60 min  Patient has met 5 of 5 short term goals.  Pt is making slow but steady progress with functional mobility. Patient is currently min A for sit <> supine and sit <> stand from w/c, mod A for stand or squat pivot transfers, min A with gait using L rail in hallway, mod-max A with gait using RW, and max A with w/c propulsion using LUE (not functional for L hemi technique due to patient's height and inability to reach ground with LLE). Patient's LOS extended with plan for d/c home with hired caregivers vs short term SNF before returning home with daughter.   Patient continues to demonstrate the following deficits: muscle weakness, decreased endurance, impaired timing and sequencing, decreased coordination and decreased motor planning, decreased visual motor skills, decreased midline orientation, R inattention, decreased motor planning and decreased initiation, decreased attention, decreased awareness, decreased problem solving and decreased safety awareness, and fixed postural limitations and therefore will continue to benefit from skilled PT intervention to enhance overall performance with activity tolerance, balance, postural control, ability to compensate for deficits, functional use of right upper extremity and right lower extremity, attention, awareness, aphasia, decreased independence with functional mobility, and reduce caregiver burden.   Patient progressing toward long term goals..  Plan of care revisions: W/c goal discharged, balance and transfer goals upgraded from mod to min A and ambulation goals added due to functional progress with mobility this week.   PT Short Term Goals Week 1:  PT Short Term Goal 1  (Week 1): Pt will roll R and L with min A with use of bedrails PT Short Term Goal 1 - Progress (Week 1): Met PT Short Term Goal 2 (Week 1): Pt will perform squat pivot transfer at max A level to the R or L PT Short Term Goal 2 - Progress (Week 1): Met PT Short Term Goal 3 (Week 1): Pt will perform dymamic sitting at mod A level PT Short Term Goal 3 - Progress (Week 1): Met PT Short Term Goal 4 (Week 1): Pt will perform sit<>stand at max A level PT Short Term Goal 4 - Progress (Week 1): Met PT Short Term Goal 5 (Week 1): Pt will self propel w/c using L hemi technique at max A level PT Short Term Goal 5 - Progress (Week 1): Met Week 2:  PT Short Term Goal 1 (Week 2): = LTGs of overall min A  Skilled Therapeutic Interventions/Progress Updates:   Pt received semi reclined in bed, initially refusing therapist but able to to be coaxed to EOB with min A. Patient performed sit > stand from EOB with use of RW and ambulated out of room x 30-40 ft with mod-max A and w/c follow. Pt appearing fatigued and reporting "yes" when prompted. Pt requires max vc's for upright posture but with decreased ability to hold up head this date compared to previous sessions. W/c mobility x 50 ft with use of LUE and max A for steering and propulsion.  In gym, located more appropriate pediatric height RW and performed gait training 2 x 15 ft with mod-max A for weightshift to L in order to clear R LE and patient able to keep RUE on RW better with lower RW. Pt's grandson arriving, family  education provided regarding extending LOS with upgraded goals from mod-min A and grandson pleased with patient progress and POC. Pt performed stand pivot transfer mat > w/c > bed with mod A and sit > supine with light min A for RLE, pt able to scoot herself up higher in bed with cues for technique at supervision level. Pt left semi reclined in bed, grandson present.   Therapy Documentation Precautions:  Precautions Precautions: Fall Precaution  Comments: Rt sided hemiparesis, aphasia, severe kyphosis/scoliosis, recent pelvic fx, right neglect Restrictions Weight Bearing Restrictions: No LLE Weight Bearing: Weight bearing as tolerated Vital Signs: Therapy Vitals Temp: 98 F (36.7 C) Temp src: Oral Pulse Rate: 83 Resp: 16 BP: 122/71 mmHg Patient Position (if appropriate): Lying Oxygen Therapy SpO2: 95 % O2 Device: None (Room air) Pain:  No pain Locomotion : Ambulation Ambulation/Gait Assistance: 2: Max assist;3: Mod assist   See FIM for current functional status  Therapy/Group: Individual Therapy  Laretta Alstrom 06/29/2014, 3:09 PM

## 2014-06-29 NOTE — Progress Notes (Signed)
Social Work Patient ID: Christy Miranda, female   DOB: [birth year], 80 y.o.   MRN: [id number]  Christy Miranda Mercer Stallworth, LCSW Social Worker Signed  Patient Care Conference Service date: 06/28/2014 2:10 PM  Inpatient RehabilitationTeam Conference and Plan of Care Update Date: 06/28/2014   Time: 11:30 AM     Patient Name: Christy Miranda       Medical Record Number: [id number]   Date of Birth: [birth year] Sex: Female         Room/Bed: 4W17C/4W17C-01 Payor Info: Payor: MEDICARE / Plan: MEDICARE PART A AND B / Product Type: *No Product type* /   Admitting Diagnosis: L CVA   Admit Date/Time:  06/19/2014  6:51 PM Admission Comments: No comment available   Primary Diagnosis:  <principal problem not specified> Principal Problem: <principal problem not specified>    Patient Active Problem List     Diagnosis  Date Noted   .  CVA (cerebral infarction)  06/19/2014   .  Undernutrition  06/19/2014   .  Fracture of multiple pubic rami  05/22/2014   .  Encounter for therapeutic drug monitoring  01/02/2014   .  Dizziness and giddiness  07/22/2013   .  Thrombocytopenia  11/16/2012   .  Deep venous thrombosis of lower leg  11/13/2012   .  Subtherapeutic international normalized ratio (INR)  11/13/2012   .  Pancytopenia  11/13/2012   .  Edema  11/07/2012   .  Pacemaker-[employer]tr[employer]  08/19/2012   .  Current use of long term anticoagulation  08/08/2011   .  Atrial fibrillation  02/28/2011   .  Tachycardia-bradycardia syndrome  02/28/2011   .  Hypertension  02/28/2011     Expected Discharge Date: Expected Discharge Date: 07/07/14  Team Members Present: Physician leading conference: Dr. Alysia Penna Social Worker Present: Alfonse Alpers, LCSW Nurse Present: Other (comment) Hester Mates, RN) PT Present: Carney Living, PT OT Present: Willeen Cass, OT SLP Present: Gunnar Fusi, SLP PPS Coordinator present : Daiva Nakayama, RN, CRRN;Becky Alwyn Ren, PT        Current Status/Progress  Goal  Weekly Team  Focus   Medical     No change with po restrictions  adequate fluid intake  recheck swallow   Bowel/Bladder     incont. bowel and bladder, LBM 06/26/14  Managed bowel and bladder   Timed toileting during the day   Swallow/Nutrition/ Hydration     dys 1 textures, honey thick liquids via teaspoon with min-mod assist   least restrictive PO with Min assist   dys 2 trials, carryover of swallowing precautions   ADL's     Patient overall mod>max assist with functional transfers (stand pivot technique), max>total for LB self-care tasks & toileting needs, min assist for UB ADLs  overall mod assist for LB selfcare and functional transfers except for shower transfers which are set at max assist level currently.  ADL retraining, initiation, problem solving, functional communication, positioning, neuro re-ed, pt/family education, functional transfers, sit<>stands, functional mobility   Mobility     mod A for squat pivot/stand pivot transfers, min-mod A standing and gait up to 30 ft in hall with L rail, mod-max A bed mobility     mod A transfers, min A w/c level (may upgrade/add ambulation goal)  bed mobility, transfers, gait, standing balance, w/c positioning   Communication     max-total assist for receptive and expressive  max assist   automatic sequences, receptive naming, recognition of objects    Safety/Cognition/ Behavioral Observations  max-total assist   max assist   increase awareness of perseveration and timely cessation of tasks    Pain     No c/o pain   Pain <3  Assess for and treat pain q shift as needed   Skin     Scab to L ankle, bursing to R leg, L eye , and R arm  NO ski n breakdown  Assess skin q shift for breakdown    Rehab Goals Patient on target to meet rehab goals: Yes Rehab Goals Revised: Pt's PT goals upgraded to min assist *See Care Plan and progress notes for long and short-term goals.    Barriers to Discharge:  needs IV      Possible Resolutions to Barriers:    ?  upgrade diet and  trial off IVF      Discharge Planning/Teaching Needs:    Family is still deciding if pt will go home with family and paid caregivers or go to a SNF.   Dtr works 3 days a week and can be here for family education on Mondays and Fridays.   Team Discussion:    Pt is doing well medically.  She ambulated 65' with using left handrail and PT hopes to try walking with the walker.  Pt is min-max squat pivot tx.  ST is still focusing on swallowing and advancing pt's diet.  She will have a repeat MBS soon.  Pt is following commands well and using right arm more.  If the plan is for pt to go home, dtr and caregivers to start family education at the beginning of next week.   Revisions to Treatment Plan:    PT goals upgraded and pt's stay extended to 07-07-14 to continue progressing pt.    Continued Need for Acute Rehabilitation Level of Care: The patient requires daily medical management by a physician with specialized training in physical medicine and rehabilitation for the following conditions: Daily direction of a multidisciplinary physical rehabilitation program to ensure safe treatment while eliciting the highest outcome that is of practical value to the patient.: Yes Daily medical management of patient stability for increased activity during participation in an intensive rehabilitation regime.: Yes Daily analysis of laboratory values and/or radiology reports with any subsequent need for medication adjustment of medical intervention for : Neurological problems;Other  Anurag Scarfo, Christy Miranda 06/28/2014, 2:11 PM

## 2014-06-29 NOTE — Procedures (Signed)
Objective Swallowing Evaluation: Modified Barium Swallowing Study  Patient Details  Name: Christy Miranda MRN: [id number] Date of Birth: 1921/06/25  Today's Date: 06/29/2014 Time: 0900-0930 Time Calculation (min): 30 min  Past Medical History:  Past Medical History  Diagnosis Date  . HTN (hypertension)   . Permanent atrial fibrillation   . Bradycardia     s/p PPM  . Hypothyroidism   . Anxiety   . Diverticulitis     history of  . History of hysterectomy   . Long-term (current) use of anticoagulants   . Scoliosis (and kyphoscoliosis), idiopathic   . DVT (deep vein thrombosis) in pregnancy     right peroneal  . Dizziness and giddiness 07/22/2013   Past Surgical History:  Past Surgical History  Procedure Laterality Date  . Pacemaker insertion  03/03/2006    most recent generator (MDT) change 10/30/06 by Dr Verlon Setting  . Cardiac catheterization  01/08/1999    normal LV function  . Cardioversion  10/30/2006    Successful elective DC cardioversion  . Cardioversion  12/29/2002    Successful DC cardioversion  . Cardioversion  12/03/1999    Successful DC cardioversion  . Cholecystectomy     HPI:  Christy Miranda is a 80 y.o. female with a history of afib on coumadin who has recently had difficulty with regulating her INR. She was seen to be normal at 7:15 am when her tray was taken to her. At 8 - 8:30 when the tech went to get her tray, she noticed that she was different and EMS was called.      Recommendation/Prognosis  Clinical Impression:   Dysphagia Diagnosis: Moderate oral phase dysphagia;Moderate pharyngeal phase dysphagia Clinical impression: Pt's oropharyngeal swallow has made minimal gains since previous MBS 7/16. Decreased oral strength and manipulation results in decreased bolus cohesion, with delayed pharyngeal swallow initiated primarily at the level of the valleculae with all consistencies tested. During the swallow, pt was observed to silently penetrate honey and  nectar thick liquids, although honey thick liquids by spoon were primarily cleared upon completion of the swallow. Of note, pt was unable to obtain larger quantities of liquid via cup sips, consuming significantly more liquid per sip when administered via teaspoon (suspect due to posture). Pt silently aspirated and penetrated both honey and nectar thick liquids during this study after the original swallow while recording was turned off between trials. Given trace vallecular residuals present, suspect aspiration/penetration occurred from additional swallows to clear oral residuals. While pt's aspiration risk remains high with both honey and nectar thick liquids, recommend continuing more conservatively with current diet of Dys 1 textures and honey thick liquids by spoon as pt appears to have been tolerating this for the past two weeks.   Swallow Evaluation Recommendations:  Diet Recommendations: Dysphagia 1 (Puree);Honey-thick liquid Liquid Administration via: Spoon Medication Administration: Crushed with puree Supervision: Full supervision/cueing for compensatory strategies;Patient able to self feed Compensations: Slow rate;Small sips/bites;Check for pocketing;Check for anterior loss Postural Changes and/or Swallow Maneuvers: Seated upright 90 degrees;Upright 30-60 min after meal;Out of bed for meals Oral Care Recommendations: Oral care BID Other Recommendations: Order thickener from pharmacy;Prohibited food (jello, ice cream, thin soups);Remove water pitcher Follow up Recommendations: Home health SLP;Skilled Nursing facility;24 hour supervision/assistance    Prognosis:      Individuals Consulted: Consulted and Agree with Results and Recommendations: Patient;Other (Comment) (primary SLP)      SLP Assessment/Plan  Plan:  Potential to Achieve Goals: Fair Potential Considerations: Severity of impairments;Previous level of function;Family/community support  Short Term Goals: Week 2: SLP Short  Term Goal 1 (Week 2): Patient will consume trials of advanced consistencies with no overt s/s of aspiration and min cuing for use of swallowing precaution for diet upgrade. SLP Short Term Goal 2 (Week 2): Patient will participate in automatic verbal tasks wtih Max verbal and written cues. SLP Short Term Goal 3 (Week 2): Patient will identify named object from a field of 2 with Mod multimodal cues. SLP Short Term Goal 4 (Week 2): Patient will solve basic problems related to self care with Mod multiodal cues.  SLP Short Term Goal 5 (Week 2): Patient will sustain attention to basic self-care tasks for 3-5 minutes with Mod cues for redirection.    General: Date of Onset: 06/13/14 Type of Study: Modified Barium Swallowing Study Reason for Referral: Objectively evaluate swallowing function Previous Swallow Assessment: BSE 7/14 recommending NPO; MBS 06/15/14 recommending Dys.1 honey-thick liquids via teaspoon Diet Prior to this Study: Dysphagia 1 (puree);Honey-thick liquids Temperature Spikes Noted: No Respiratory Status: Room air History of Recent Intubation: No Behavior/Cognition: Alert;Cooperative;Pleasant mood;Requires cueing;Distractible;Decreased sustained attention Oral Cavity - Dentition: Adequate natural dentition Self-Feeding Abilities: Able to feed self with adaptive devices;Needs assist Patient Positioning: Upright in chair Baseline Vocal Quality: Low vocal intensity Volitional Cough: Other (Comment) (reflexive cough weak) Volitional Swallow: Unable to elicit Anatomy: Other (Comment) (head tilted anteriorly throughout session) Pharyngeal Secretions: Not observed secondary MBS   Reason for Referral:   Objectively evaluate swallowing function    Oral Phase: Oral Preparation/Oral Phase Oral Phase: Impaired Oral - Honey Oral - Honey Teaspoon: Right anterior bolus loss;Weak lingual manipulation;Lingual pumping;Reduced posterior propulsion;Delayed oral transit;Lingual/palatal  residue Oral - Honey Cup: Right anterior bolus loss;Weak lingual manipulation;Lingual pumping;Reduced posterior propulsion;Delayed oral transit;Lingual/palatal residue Oral - Nectar Oral - Nectar Teaspoon: Right anterior bolus loss;Weak lingual manipulation;Lingual pumping;Reduced posterior propulsion;Delayed oral transit;Lingual/palatal residue Oral - Nectar Cup: Right anterior bolus loss;Weak lingual manipulation;Lingual pumping;Reduced posterior propulsion;Delayed oral transit;Lingual/palatal residue Oral - Nectar Straw: Right anterior bolus loss;Weak lingual manipulation;Lingual pumping;Reduced posterior propulsion;Delayed oral transit;Lingual/palatal residue;Other (Comment) (increased speed of oral transit with straw as compared to other liquid consistencies and methods of intake) Oral - Solids Oral - Puree: Right anterior bolus loss;Weak lingual manipulation;Lingual pumping;Reduced posterior propulsion;Delayed oral transit;Lingual/palatal residue;Right pocketing in lateral sulci   Pharyngeal Phase:  Pharyngeal Phase Pharyngeal Phase: Impaired Pharyngeal - Honey Pharyngeal - Honey Teaspoon: Delayed swallow initiation;Reduced anterior laryngeal mobility;Reduced laryngeal elevation;Penetration/Aspiration during swallow;Reduced airway/laryngeal closure;Reduced tongue base retraction;Pharyngeal residue - valleculae Penetration/Aspiration details (honey teaspoon): Material enters airway, remains ABOVE vocal cords then ejected out Pharyngeal - Honey Cup: Delayed swallow initiation;Reduced anterior laryngeal mobility;Reduced laryngeal elevation;Penetration/Aspiration during swallow;Reduced airway/laryngeal closure;Penetration/Aspiration after swallow;Reduced tongue base retraction;Pharyngeal residue - valleculae Penetration/Aspiration details (honey cup): Material enters airway, passes BELOW cords without attempt by patient to eject out (silent aspiration) Pharyngeal - Nectar Pharyngeal - Nectar  Teaspoon: Delayed swallow initiation;Reduced anterior laryngeal mobility;Reduced laryngeal elevation;Penetration/Aspiration during swallow;Reduced airway/laryngeal closure;Reduced tongue base retraction;Pharyngeal residue - valleculae Penetration/Aspiration details (nectar teaspoon): Material enters airway, remains ABOVE vocal cords and not ejected out Pharyngeal - Nectar Cup: Delayed swallow initiation;Reduced anterior laryngeal mobility;Reduced laryngeal elevation;Reduced airway/laryngeal closure;Reduced tongue base retraction;Pharyngeal residue - valleculae;Penetration/Aspiration after swallow Penetration/Aspiration details (nectar cup): Material enters airway, remains ABOVE vocal cords and not ejected out Pharyngeal - Nectar Straw: Delayed swallow initiation;Reduced anterior laryngeal mobility;Reduced laryngeal elevation;Penetration/Aspiration during swallow;Reduced airway/laryngeal closure;Reduced tongue base retraction;Pharyngeal residue - valleculae Penetration/Aspiration details (nectar straw): Material enters airway, remains ABOVE vocal cords and not ejected out Pharyngeal - Solids  Pharyngeal - Puree: Delayed swallow initiation;Reduced anterior laryngeal mobility;Reduced laryngeal elevation;Reduced airway/laryngeal closure;Reduced tongue base retraction;Pharyngeal residue - valleculae Penetration/Aspiration details (puree): Material does not enter airway   Cervical Esophageal Phase  Cervical Esophageal Phase Cervical Esophageal Phase: WFL   GN         Germain Osgood, M.A. CCC-SLP [phone]  Germain Osgood 06/29/2014, 11:50 AM

## 2014-06-29 NOTE — Progress Notes (Signed)
80 y.o. right-handed female with history of atrial fibrillation status post pacemaker with chronic Coumadin therapy, recent fall with pelvic fractures and had recently been at skilled nursing facility secondary to pelvic fractures.. Admitted 06/13/2049 with right-sided weakness as well as aphasia. INR on admission of 1.35. Initial cranial CT scan negative for acute changes. Patient did receive TPA. Followup cranial CT scan showed no 3.6 cm left basal ganglia parenchymal hemorrhage consistent with hemorrhagic transformation of acute infarct. 5 mm rightward midline shift. Echocardiogram with ejection fraction of 55% no wall motion abnormalities. Carotid Dopplers with no ICA stenosis. Neurology services consulted with workup ongoing. Aspirin held for post TPA hemorrhage. Repeat cranial CT scan 06/19/2014 shows slight interval decrease in size of hematoma centered in the left putamen, measuring 30 x 28 mm  Subjective/Complaints:   Apraxia noted Objective: Vital Signs: Blood pressure 158/77, pulse 84, temperature 97.9 F (36.6 C), temperature source Oral, resp. rate 17, height 5' (1.524 m), weight 50.984 kg (112 lb 6.4 oz), SpO2 93.00%. No results found. Results for orders placed during the hospital encounter of 06/19/14 (from the past 72 hour(s))  BASIC METABOLIC PANEL     Status: Abnormal   Collection Time    06/28/14 12:55 PM      Result Value Ref Range   Sodium 139  137 - 147 mEq/L   Potassium 4.3  3.7 - 5.3 mEq/L   Chloride 99  96 - 112 mEq/L   CO2 25  19 - 32 mEq/L   Glucose, Bld 129 (*) 70 - 99 mg/dL   BUN 13  6 - 23 mg/dL   Creatinine, Ser 0.39 (*) 0.50 - 1.10 mg/dL   Calcium 9.0  8.4 - 10.5 mg/dL   GFR calc non Af Amer 89 (*) >90 mL/min   GFR calc Af Amer >90  >90 mL/min   Comment: (NOTE)     The eGFR has been calculated using the CKD EPI equation.     This calculation has not been validated in all clinical situations.     eGFR's persistently <90 mL/min signify possible Chronic Kidney      Disease.   Anion gap 15  5 - 15      Constitutional:  80 year old frail white female. Head forward posture and leaning to the right. Eyes:  Pupils reactive to light  Neck: Normal range of motion. Neck supple. No thyromegaly present.  Cardiovascular:  Irreg Irreg. No murmur  Respiratory: Effort normal and breath sounds normal. No respiratory distress.  GI: Soft. Bowel sounds are normal. She exhibits no distension. Non-tender Neurological:  Patient is alert. Left gaze preference. She was expressively and receptively aphasic with apraxia. She did follow a few simple commands. Made eye contact when cued.RUE: 2-/5 deltoid, bicep, tricep, 3-/5 finger flex. RLE: 3- HF, KE, and 2- ADF/PF but inconsistent.  Increased tone R biceps MAS2 Skin: Skin is warm and dry  Psych: flat, without agitation Ext- neg edema  Assessment/Plan: 1. Functional deficits secondary to  left basal ganglia parenchymal hemorrhage consistent with hemorrhagic transformation of acute infarct which require 3+ hours per day of interdisciplinary therapy in a comprehensive inpatient rehab setting. Physiatrist is providing close team supervision and 24 hour management of active medical problems listed below. Physiatrist and rehab team continue to assess barriers to discharge/monitor patient progress toward functional and medical goals.   FIM: FIM - Bathing Bathing Steps Patient Completed: Chest;Right Arm;Abdomen;Front perineal area;Buttocks;Left upper leg;Right upper leg Bathing: 3: Mod-Patient completes 5-7 2f10 parts  or 50-74% (min for UB bathing, min for LB bathing)  FIM - Upper Body Dressing/Undressing Upper body dressing/undressing steps patient completed: Thread/unthread left sleeve of pullover shirt/dress;Put head through opening of pull over shirt/dress;Pull shirt over trunk Upper body dressing/undressing: 4: Min-Patient completed 75 plus % of tasks (with increased time) FIM - Lower Body  Dressing/Undressing Lower body dressing/undressing steps patient completed: Thread/unthread left pants leg Lower body dressing/undressing: 1: Total-Patient completed less than 25% of tasks  FIM - Musician Devices: Grab bar or rail for support Toileting: 1: Total-Patient completed zero steps, helper did all 3  FIM - Radio producer Devices: Recruitment consultant Transfers: 2-To toilet/BSC: Max A (lift and lower assist);2-From toilet/BSC: Max A (lift and lower assist)  FIM - Control and instrumentation engineer Devices: Arm rests Bed/Chair Transfer: 3: Chair or W/C > Bed: Mod A (lift or lower assist);4: Sit > Supine: Min A (steadying pt. > 75%/lift 1 leg)  FIM - Locomotion: Wheelchair Locomotion: Wheelchair: 0: Activity did not occur FIM - Locomotion: Ambulation Locomotion: Ambulation Assistive Devices: Other (comment) (L rail) Ambulation/Gait Assistance: 4: Min assist Locomotion: Ambulation: 0: Activity did not occur  Comprehension Comprehension Mode: Auditory Comprehension: 2-Understands basic 25 - 49% of the time/requires cueing 51 - 75% of the time  Expression Expression Mode: Verbal Expression: 1-Expresses basis less than 25% of the time/requires cueing greater than 75% of the time.  Social Interaction Social Interaction: 2-Interacts appropriately 25 - 49% of time - Needs frequent redirection.  Problem Solving Problem Solving: 1-Solves basic less than 25% of the time - needs direction nearly all the time or does not effectively solve problems and may need a restraint for safety  Memory Memory: 1-Recognizes or recalls less than 25% of the time/requires cueing greater than 75% of the time  Medical Problem List and Plan:  1. 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 No aspirin at this time repeat CT scan in one week to decide upon initiation of aspirin  2. DVT  Prophylaxis/Anticoagulation: SCDs. Monitor for any signs of DVT  3. Pain Management: Tylenol as needed  4. Mood/anxiety. Xanax 0.25 mg 3 times a day as needed. Provide emotional support:  5. Neuropsych: This patient is not capable of making decisions on her own behalf.   6. Dysphagia. Dysphagia 1 with honey thick liquids.Followup chemistries look good, holding IVF, repeat MBS, hopefully will get pt drinking at cup level 7. History of atrial fibrillation/pacemaker with chronic Coumadin. Coumadin held secondary to hemorrhagic transformation x 3-4 wk.  To f/u with Neuro post D/C to determine restart date on warfarin Cardiac rate controlled. Continue Toprol 50 mg twice a day for rate control  8. Recent pelvic fracture. Weightbearing as tolerated. No pain with LE ROM 9. Hypothyroidism. Synthroid 10.  urinary incont, UA neg, trial anticholinergic  LOS (Days) 10 A FACE TO FACE EVALUATION WAS PERFORMED  Khiara Shuping E 06/29/2014, 7:07 AM

## 2014-06-29 NOTE — Progress Notes (Signed)
Speech Language Pathology Daily Session Note  Patient Details  Name: Matha Masse MRN: [id number] Date of Birth: 14-Dec-1920  Today's Date: 06/29/2014 Time: 1500-1530 Time Calculation (min): 30 min  Short Term Goals: Week 2: SLP Short Term Goal 1 (Week 2): Patient will consume trials of advanced consistencies with no overt s/s of aspiration and min cuing for use of swallowing precaution for diet upgrade. SLP Short Term Goal 2 (Week 2): Patient will participate in automatic verbal tasks wtih Max verbal and written cues. SLP Short Term Goal 3 (Week 2): Patient will identify named object from a field of 2 with Mod multimodal cues. SLP Short Term Goal 4 (Week 2): Patient will solve basic problems related to self care with Mod multiodal cues.  SLP Short Term Goal 5 (Week 2): Patient will sustain attention to basic self-care tasks for 3-5 minutes with Mod cues for redirection.  Skilled Therapeutic Interventions:  Pt was seen for skilled speech therapy targeting expressive language goals.  Pt's grandson was present for half of today's therapy session and was updated regarding pt's current goals and progress in therapy as well as results and recommendations from today's MBS.  Pt was observed with honey thick liquids via teaspoon with pt exhibiting no overt s/s of aspiration; however, pt was noted with increased rate of self feeding and required min-mod tactile cues for slow rate.   Pt was ~75% accurate for counting 1-10 in unison with SLP and mod-max assist phonemic placement cues for irregular and inconsistent production of consonants.  Furthermore, pt was ~50% accurate for confrontational naming of basic objects with max assist phonemic placement cues and written visual aids.  When singing in unison with SLP, pt was ~25% accurate for correct production of phonemes with mod-max assist visual cues.  Continue per current plan of care.    FIM:  Comprehension Comprehension Mode: Auditory Comprehension:  2-Understands basic 25 - 49% of the time/requires cueing 51 - 75% of the time Expression Expression Mode: Verbal Expression: 1-Expresses basis less than 25% of the time/requires cueing greater than 75% of the time. Social Interaction Social Interaction: 2-Interacts appropriately 25 - 49% of time - Needs frequent redirection. Problem Solving Problem Solving: 1-Solves basic less than 25% of the time - needs direction nearly all the time or does not effectively solve problems and may need a restraint for safety Memory Memory: 1-Recognizes or recalls less than 25% of the time/requires cueing greater than 75% of the time  Pain Pain Assessment Pain Assessment: Faces Faces Pain Scale: Hurts little more Pain Type: Acute pain Pain Location: Head Pain Orientation: Left Pain Descriptors / Indicators: Headache Pain Onset: Gradual Pain Intervention(s): RN made aware  Therapy/Group: Individual Therapy  Medrith Veillon, Selinda Orion 06/29/2014, 4:45 PM

## 2014-06-30 ENCOUNTER — Inpatient Hospital Stay (HOSPITAL_COMMUNITY): Payer: Medicare Other | Admitting: Rehabilitation

## 2014-06-30 ENCOUNTER — Ambulatory Visit (HOSPITAL_COMMUNITY): Payer: Medicare Other | Admitting: Speech Pathology

## 2014-06-30 ENCOUNTER — Inpatient Hospital Stay (HOSPITAL_COMMUNITY): Payer: Medicare Other | Admitting: Speech Pathology

## 2014-06-30 ENCOUNTER — Inpatient Hospital Stay (HOSPITAL_COMMUNITY): Payer: Medicare Other | Admitting: Occupational Therapy

## 2014-06-30 DIAGNOSIS — Z5189 Encounter for other specified aftercare: Secondary | ICD-10-CM

## 2014-06-30 DIAGNOSIS — I4891 Unspecified atrial fibrillation: Secondary | ICD-10-CM

## 2014-06-30 DIAGNOSIS — I10 Essential (primary) hypertension: Secondary | ICD-10-CM

## 2014-06-30 DIAGNOSIS — I6330 Cerebral infarction due to thrombosis of unspecified cerebral artery: Secondary | ICD-10-CM

## 2014-06-30 NOTE — Progress Notes (Signed)
Physical Therapy Session Note  Patient Details  Name: Christy Miranda MRN: [id number] Date of Birth: 06-30-1921  Today's Date: 06/30/2014 Time: 1301-1402 Time Calculation (min): 61 min  Short Term Goals: Week 2:  PT Short Term Goal 1 (Week 2): = LTGs of overall min A  Skilled Therapeutic Interventions/Progress Updates:   Pt received sitting in w/c, agreeable to therapy session.  Assisted pt down to ADL apt in order to work on dynamic standing balance, reaching, bimanual tasks, and increased attention to the R with standing at RW folding clothing.  Pt able to initiate task well and attempted to incorporate RUE into task, however continues to demonstrate increased apraxia and decreased coordination and was not able to use RUE functionally during task.  Daughter joined session and observed remainder of session.  Discussed home vs SNF and daughter feels that they are unable to manage pt at home at this time with her increased communication deficits.  PT verbalized understanding on wanting pt to be able to communicate needs and be more accurate with "yes/no" answers prior to D/C home.  Pt then performed gait training in hallway x 35' x 1 and another 55' x 1 with RW at min/mod A level.  Note increased difficulty with R hand placement this afternoon, possibly due to fatigue, but did not attempt to use R hand orthosis.  Continue to provide facilitation at hips for weight shift and cues for upright head posture throughout.  Performed seated nustep x 6 mins at level 1 resistance with BUE/LE.  Therapist assisting with attention to RUE and maintaining RUE in place throughout task.  Ended session with assisting back to room and transferring to/from toilet.  Pt able to void urine with no noted incontinence in brief.  Transferred back to bed and left in bed with 3 bed rails and bed alarm set.  All needs in reach and daughter in room.   Therapy Documentation Precautions:  Precautions Precautions: Fall Precaution  Comments: Rt sided hemiparesis, aphasia, severe kyphosis/scoliosis, recent pelvic fx, right neglect Restrictions Weight Bearing Restrictions: No LLE Weight Bearing: Weight bearing as tolerated   Vital Signs: Therapy Vitals Temp: 98 F (36.7 C) Temp src: Oral Pulse Rate: 86 Resp: 18 BP: 137/66 mmHg Patient Position (if appropriate): Lying Oxygen Therapy SpO2: 100 % O2 Device: None (Room air) Pain: Pain Assessment Pain Assessment: No/denies pain     See FIM for current functional status  Therapy/Group: Individual Therapy  Denice Bors 06/30/2014, 2:05 PM

## 2014-06-30 NOTE — Progress Notes (Signed)
80 y.o. right-handed female with history of atrial fibrillation status post pacemaker with chronic Coumadin therapy, recent fall with pelvic fractures and had recently been at skilled nursing facility secondary to pelvic fractures.. Admitted 06/13/2049 with right-sided weakness as well as aphasia. INR on admission of 1.35. Initial cranial CT scan negative for acute changes. Patient did receive TPA. Followup cranial CT scan showed no 3.6 cm left basal ganglia parenchymal hemorrhage consistent with hemorrhagic transformation of acute infarct. 5 mm rightward midline shift. Echocardiogram with ejection fraction of 55% no wall motion abnormalities. Carotid Dopplers with no ICA stenosis. Neurology services consulted with workup ongoing. Aspirin held for post TPA hemorrhage. Repeat cranial CT scan 06/19/2014 shows slight interval decrease in size of hematoma centered in the left putamen, measuring 30 x 28 mm  Subjective/Complaints:   Apraxia noted Remains aphasic   ROS unable secondary to aphasia Objective: Vital Signs: Blood pressure 155/56, pulse 72, temperature 98 F (36.7 C), temperature source Oral, resp. rate 17, height 5' (1.524 m), weight 50.984 kg (112 lb 6.4 oz), SpO2 98.00%. Dg Swallowing Func-speech Pathology  06/29/2014   Germain Osgood, CCC-SLP     06/29/2014 11:51 AM   Objective Swallowing Evaluation: Modified Barium Swallowing Study   Patient Details  Name: Christy Miranda MRN: [id number] Date of Birth: 1921/03/07  Today's Date: 06/29/2014 Time: 0900-0930 Time Calculation (min): 30 min  Past Medical History:  Past Medical History  Diagnosis Date  . HTN (hypertension)   . Permanent atrial fibrillation   . Bradycardia     s/p PPM  . Hypothyroidism   . Anxiety   . Diverticulitis     history of  . History of hysterectomy   . Long-term (current) use of anticoagulants   . Scoliosis (and kyphoscoliosis), idiopathic   . DVT (deep vein thrombosis) in pregnancy     right peroneal  . Dizziness and giddiness  07/22/2013   Past Surgical History:  Past Surgical History  Procedure Laterality Date  . Pacemaker insertion  03/03/2006    most recent generator (MDT) change 10/30/06 by Dr Verlon Setting  . Cardiac catheterization  01/08/1999    normal LV function  . Cardioversion  10/30/2006    Successful elective DC cardioversion  . Cardioversion  12/29/2002    Successful DC cardioversion  . Cardioversion  12/03/1999    Successful DC cardioversion  . Cholecystectomy     HPI:  Christy Miranda is a 80 y.o. female with a history of afib on  coumadin who has recently had difficulty with regulating her INR.  She was seen to be normal at 7:15 am when her tray was taken to  her. At 8 - 8:30 when the tech went to get her tray, she noticed  that she was different and EMS was called.      Recommendation/Prognosis  Clinical Impression:   Dysphagia Diagnosis: Moderate oral phase dysphagia;Moderate  pharyngeal phase dysphagia Clinical impression: Pt's oropharyngeal swallow has made minimal  gains since previous MBS 7/16. Decreased oral strength and  manipulation results in decreased bolus cohesion, with delayed  pharyngeal swallow initiated primarily at the level of the  valleculae with all consistencies tested. During the swallow, pt  was observed to silently penetrate honey and nectar thick  liquids, although honey thick liquids by spoon were primarily  cleared upon completion of the swallow. Of note, pt was unable to  obtain larger quantities of liquid via cup sips, consuming  significantly more liquid per sip when administered via teaspoon  (suspect due to  posture). Pt silently aspirated and penetrated  both honey and nectar thick liquids during this study after the  original swallow while recording was turned off between trials.  Given trace vallecular residuals present, suspect  aspiration/penetration occurred from additional swallows to clear  oral residuals. While pt's aspiration risk remains high with both  honey and nectar thick liquids,  recommend continuing more  conservatively with current diet of Dys 1 textures and honey  thick liquids by spoon as pt appears to have been tolerating this  for the past two weeks.   Swallow Evaluation Recommendations:  Diet Recommendations: Dysphagia 1 (Puree);Honey-thick liquid Liquid Administration via: Spoon Medication Administration: Crushed with puree Supervision: Full supervision/cueing for compensatory  strategies;Patient able to self feed Compensations: Slow rate;Small sips/bites;Check for  pocketing;Check for anterior loss Postural Changes and/or Swallow Maneuvers: Seated upright 90  degrees;Upright 30-60 min after meal;Out of bed for meals Oral Care Recommendations: Oral care BID Other Recommendations: Order thickener from pharmacy;Prohibited  food (jello, ice cream, thin soups);Remove water pitcher Follow up Recommendations: Home health SLP;Skilled Nursing  facility;24 hour supervision/assistance    Prognosis:      Individuals Consulted: Consulted and Agree with Results and  Recommendations: Patient;Other (Comment) (primary SLP)      SLP Assessment/Plan  Plan:  Potential to Achieve Goals: Fair Potential Considerations: Severity of impairments;Previous level  of function;Family/community support   Short Term Goals: Week 2: SLP Short Term Goal 1 (Week 2): Patient  will consume trials of advanced consistencies with no overt s/s  of aspiration and min cuing for use of swallowing precaution for  diet upgrade. SLP Short Term Goal 2 (Week 2): Patient will participate in  automatic verbal tasks wtih Max verbal and written cues. SLP Short Term Goal 3 (Week 2): Patient will identify named  object from a field of 2 with Mod multimodal cues. SLP Short Term Goal 4 (Week 2): Patient will solve basic problems  related to self care with Mod multiodal cues.  SLP Short Term Goal 5 (Week 2): Patient will sustain attention to  basic self-care tasks for 3-5 minutes with Mod cues for  redirection.    General: Date of Onset:  06/13/14 Type of Study: Modified Barium Swallowing Study Reason for Referral: Objectively evaluate swallowing function Previous Swallow Assessment: BSE 7/14 recommending NPO; MBS  06/15/14 recommending Dys.1 honey-thick liquids via teaspoon Diet Prior to this Study: Dysphagia 1 (puree);Honey-thick liquids Temperature Spikes Noted: No Respiratory Status: Room air History of Recent Intubation: No Behavior/Cognition: Alert;Cooperative;Pleasant mood;Requires  cueing;Distractible;Decreased sustained attention Oral Cavity - Dentition: Adequate natural dentition Self-Feeding Abilities: Able to feed self with adaptive  devices;Needs assist Patient Positioning: Upright in chair Baseline Vocal Quality: Low vocal intensity Volitional Cough: Other (Comment) (reflexive cough weak) Volitional Swallow: Unable to elicit Anatomy: Other (Comment) (head tilted anteriorly throughout  session) Pharyngeal Secretions: Not observed secondary MBS   Reason for Referral:   Objectively evaluate swallowing function    Oral Phase: Oral Preparation/Oral Phase Oral Phase: Impaired Oral - Honey Oral - Honey Teaspoon: Right anterior bolus loss;Weak lingual  manipulation;Lingual pumping;Reduced posterior propulsion;Delayed  oral transit;Lingual/palatal residue Oral - Honey Cup: Right anterior bolus loss;Weak lingual  manipulation;Lingual pumping;Reduced posterior propulsion;Delayed  oral transit;Lingual/palatal residue Oral - Nectar Oral - Nectar Teaspoon: Right anterior bolus loss;Weak lingual  manipulation;Lingual pumping;Reduced posterior propulsion;Delayed  oral transit;Lingual/palatal residue Oral - Nectar Cup: Right anterior bolus loss;Weak lingual  manipulation;Lingual pumping;Reduced posterior propulsion;Delayed  oral transit;Lingual/palatal residue Oral - Nectar Straw: Right anterior bolus loss;Weak lingual  manipulation;Lingual  pumping;Reduced posterior propulsion;Delayed  oral transit;Lingual/palatal residue;Other (Comment) (increased   speed of oral transit with straw as compared to other liquid  consistencies and methods of intake) Oral - Solids Oral - Puree: Right anterior bolus loss;Weak lingual  manipulation;Lingual pumping;Reduced posterior propulsion;Delayed  oral transit;Lingual/palatal residue;Right pocketing in lateral  sulci   Pharyngeal Phase:  Pharyngeal Phase Pharyngeal Phase: Impaired Pharyngeal - Honey Pharyngeal - Honey Teaspoon: Delayed swallow initiation;Reduced  anterior laryngeal mobility;Reduced laryngeal  elevation;Penetration/Aspiration during swallow;Reduced  airway/laryngeal closure;Reduced tongue base  retraction;Pharyngeal residue - valleculae Penetration/Aspiration details (honey teaspoon): Material enters  airway, remains ABOVE vocal cords then ejected out Pharyngeal - Honey Cup: Delayed swallow initiation;Reduced  anterior laryngeal mobility;Reduced laryngeal  elevation;Penetration/Aspiration during swallow;Reduced  airway/laryngeal closure;Penetration/Aspiration after  swallow;Reduced tongue base retraction;Pharyngeal residue -  valleculae Penetration/Aspiration details (honey cup): Material enters  airway, passes BELOW cords without attempt by patient to eject  out (silent aspiration) Pharyngeal - Nectar Pharyngeal - Nectar Teaspoon: Delayed swallow initiation;Reduced  anterior laryngeal mobility;Reduced laryngeal  elevation;Penetration/Aspiration during swallow;Reduced  airway/laryngeal closure;Reduced tongue base  retraction;Pharyngeal residue - valleculae Penetration/Aspiration details (nectar teaspoon): Material enters  airway, remains ABOVE vocal cords and not ejected out Pharyngeal - Nectar Cup: Delayed swallow initiation;Reduced  anterior laryngeal mobility;Reduced laryngeal elevation;Reduced  airway/laryngeal closure;Reduced tongue base  retraction;Pharyngeal residue - valleculae;Penetration/Aspiration  after swallow Penetration/Aspiration details (nectar cup): Material enters  airway, remains ABOVE vocal  cords and not ejected out Pharyngeal - Nectar Straw: Delayed swallow initiation;Reduced  anterior laryngeal mobility;Reduced laryngeal  elevation;Penetration/Aspiration during swallow;Reduced  airway/laryngeal closure;Reduced tongue base  retraction;Pharyngeal residue - valleculae Penetration/Aspiration details (nectar straw): Material enters  airway, remains ABOVE vocal cords and not ejected out Pharyngeal - Solids Pharyngeal - Puree: Delayed swallow initiation;Reduced anterior  laryngeal mobility;Reduced laryngeal elevation;Reduced  airway/laryngeal closure;Reduced tongue base  retraction;Pharyngeal residue - valleculae Penetration/Aspiration details (puree): Material does not enter  airway   Cervical Esophageal Phase  Cervical Esophageal Phase Cervical Esophageal Phase: WFL   GN         Germain Osgood, M.A. CCC-SLP [phone]  Germain Osgood 06/29/2014, 11:50 AM                    Results for orders placed during the hospital encounter of 06/19/14 (from the past 72 hour(s))  BASIC METABOLIC PANEL     Status: Abnormal   Collection Time    06/28/14 12:55 PM      Result Value Ref Range   Sodium 139  137 - 147 mEq/L   Potassium 4.3  3.7 - 5.3 mEq/L   Chloride 99  96 - 112 mEq/L   CO2 25  19 - 32 mEq/L   Glucose, Bld 129 (*) 70 - 99 mg/dL   BUN 13  6 - 23 mg/dL   Creatinine, Ser 0.39 (*) 0.50 - 1.10 mg/dL   Calcium 9.0  8.4 - 10.5 mg/dL   GFR calc non Af Amer 89 (*) >90 mL/min   GFR calc Af Amer >90  >90 mL/min   Comment: (NOTE)     The eGFR has been calculated using the CKD EPI equation.     This calculation has not been validated in all clinical situations.     eGFR's persistently <90 mL/min signify possible Chronic Kidney     Disease.   Anion gap 15  5 - 15      Constitutional:  80 year old frail white female. Head forward posture and leaning to the right. Eyes:  Pupils reactive to light  Neck: Normal range of motion. Neck supple. No thyromegaly present.  Cardiovascular:   Irreg Irreg. No murmur  Respiratory: Effort normal and breath sounds normal. No respiratory distress.  GI: Soft. Bowel sounds are normal. She exhibits no distension. Non-tender Neurological:  Patient is alert. Left gaze preference. She was expressively and receptively aphasic with apraxia. She did follow a few simple commands. Made eye contact when cued.RUE: 2-/5 deltoid, bicep, tricep, 3-/5 finger flex. RLE: 3- HF, KE, and 2- ADF/PF but inconsistent.  Increased tone R biceps MAS2 Skin: Skin is warm and dry  Psych: flat, without agitation Ext- neg edema  Assessment/Plan: 1. Functional deficits secondary to  left basal ganglia parenchymal hemorrhage consistent with hemorrhagic transformation of acute infarct which require 3+ hours per day of interdisciplinary therapy in a comprehensive inpatient rehab setting. Physiatrist is providing close team supervision and 24 hour management of active medical problems listed below. Physiatrist and rehab team continue to assess barriers to discharge/monitor patient progress toward functional and medical goals.   FIM: FIM - Bathing Bathing Steps Patient Completed: Chest;Abdomen;Front perineal area;Buttocks;Left upper leg;Right upper leg Bathing: 3: Mod-Patient completes 5-7 [id number] parts or 50-74%  FIM - Upper Body Dressing/Undressing Upper body dressing/undressing steps patient completed: Thread/unthread left sleeve of pullover shirt/dress;Put head through opening of pull over shirt/dress Upper body dressing/undressing: 3: Mod-Patient completed 50-74% of tasks FIM - Lower Body Dressing/Undressing Lower body dressing/undressing steps patient completed: Thread/unthread left pants leg Lower body dressing/undressing: 1: Total-Patient completed less than 25% of tasks  FIM - TMusicianDevices: Grab bar or rail for support Toileting: 1: Total-Patient completed zero steps, helper did all 3  FIM - TEngineer, structuralDevices: BRecruitment consultantTransfers: 2-To toilet/BSC: Max A (lift and lower assist);2-From toilet/BSC: Max A (lift and lower assist)  FIM - BControl and instrumentation engineerDevices: Arm rests Bed/Chair Transfer: 3: Chair or W/C > Bed: Mod A (lift or lower assist);4: Sit > Supine: Min A (steadying pt. > 75%/lift 1 leg);4: Supine > Sit: Min A (steadying Pt. > 75%/lift 1 leg);3: Bed > Chair or W/C: Mod A (lift or lower assist)  FIM - Locomotion: Wheelchair Locomotion: Wheelchair: 1: Total Assistance/staff pushes wheelchair (Pt<25%) FIM - Locomotion: Ambulation Locomotion: Ambulation Assistive Devices: WAdministratorAmbulation/Gait Assistance: 2: Max assist;3: Mod assist Locomotion: Ambulation: 1: Travels less than 50 ft with maximal assistance (Pt: 25 - 49%)  Comprehension Comprehension Mode: Auditory Comprehension: 2-Understands basic 25 - 49% of the time/requires cueing 51 - 75% of the time  Expression Expression Mode: Verbal Expression: 1-Expresses basis less than 25% of the time/requires cueing greater than 75% of the time.  Social Interaction Social Interaction: 2-Interacts appropriately 25 - 49% of time - Needs frequent redirection.  Problem Solving Problem Solving: 1-Solves basic less than 25% of the time - needs direction nearly all the time or does not effectively solve problems and may need a restraint for safety  Memory Memory: 1-Recognizes or recalls less than 25% of the time/requires cueing greater than 75% of the time  Medical Problem List and Plan:  1. 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 No aspirin at this time repeat CT scan in one week to decide upon initiation of aspirin  2. DVT Prophylaxis/Anticoagulation: SCDs. Monitor for any signs of DVT  3. Pain Management: Tylenol as needed  4. Mood/anxiety. Xanax 0.25 mg 3 times a day as needed. Provide emotional support:  5.  Neuropsych: This  patient is not capable of making decisions on her own behalf.   6. Dysphagia. Dysphagia 1 with honey thick liquids.Followup chemistries look good, holding IVF, repeat MBS no sig changes   7. History of atrial fibrillation/pacemaker with chronic Coumadin. Coumadin held secondary to hemorrhagic transformation x 3-4 wk.  To f/u with Neuro post D/C to determine restart date on warfarin Cardiac rate controlled. Continue Toprol 50 mg twice a day for rate control  8. Recent pelvic fracture. Weightbearing as tolerated. No pain with LE ROM 9. Hypothyroidism. Synthroid 10.  urinary incont, UA neg, trial anticholinergic  LOS (Days) 11 A FACE TO FACE EVALUATION WAS PERFORMED  KIRSTEINS,ANDREW E 06/30/2014, 7:24 AM

## 2014-06-30 NOTE — Progress Notes (Signed)
Occupational Therapy Session Note  Patient Details  Name: Christy Miranda MRN: [id number] Date of Birth: 1921/11/25  Today's Date: 06/30/2014 Time: 1108-1208 Time Calculation (min): 60 min  Short Term Goals: Week 2:  OT Short Term Goal 1 (Week 2): Short Term Goals = Long Term Goals secondary to ELOS  Skilled Therapeutic Interventions/Progress Updates:  Patient resting in bed upon arrival. Engaged in bed mobility, unsupported sitting balance/tolerance EOB while applying lotion on both arms, bed>w/c with RW, handwriting with non dominant LUE and dominant RUE, w/c><toilet, toileting and wash hands at sink.  Focused session on visual scanning and attention to right visual field and right side of body, dynamic standing balance, forced use of RUE during functional mobility and with managing clothing during toileting.  Therapy Documentation Precautions:  Precautions Precautions: Fall Precaution Comments: Rt sided hemiparesis, aphasia, severe kyphosis/scoliosis, recent pelvic fx, right neglect Restrictions Weight Bearing Restrictions: No LLE Weight Bearing: Weight bearing as tolerated Pain:   ADL: See FIM for current functional status  Therapy/Group: Individual Therapy  Hajer Dwyer 06/30/2014, 3:24 PM

## 2014-06-30 NOTE — Progress Notes (Signed)
Speech Language Pathology Daily Session Note  Patient Details  Name: Christy Miranda MRN: [id number] Date of Birth: 1921/10/15  Today's Date: 06/30/2014 Time: 0800-0900 Time Calculation (min): 60 min  Short Term Goals: Week 2: SLP Short Term Goal 1 (Week 2): Patient will consume trials of advanced consistencies with no overt s/s of aspiration and min cuing for use of swallowing precaution for diet upgrade. SLP Short Term Goal 2 (Week 2): Patient will participate in automatic verbal tasks wtih Max verbal and written cues. SLP Short Term Goal 3 (Week 2): Patient will identify named object from a field of 2 with Mod multimodal cues. SLP Short Term Goal 4 (Week 2): Patient will solve basic problems related to self care with Mod multiodal cues.  SLP Short Term Goal 5 (Week 2): Patient will sustain attention to basic self-care tasks for 3-5 minutes with Mod cues for redirection.  Skilled Therapeutic Interventions:  Pt was seen for skilled speech therapy targeting dysphagia management and functional communication.  Upon arrival, pt was seated upright in wheelchair with nurse techs present.  Pt was awake, alert, and agreeable to participate in [place].  SLP completed skilled observations with presentations of pt's currently prescribed diet with pt exhibiting no overt s/s of aspiration and mild, diffuse oral residue of pureed consistencies secondary to oral-motor weakness.  Pt required initial mod faded to min assist multimodal cuing for use of slow rate and small bites/sips to minimize overt s/s of aspiration with dys 1 textures and honey thick liquids via teaspoon.  Pt answered basic, personally relevant questions in a functional context via choice of 2 to indicate needs and wants during functional tasks with overall mod assist multimodal cuing.  Additionally, pt was noted to use facial expressions and gestures to augment vocalizations to improve functional communication and responded to targeted yes/no questions  to verify pt's communicative intent for >50% accuracy with mod-max assist cuing.  Continue per current plan of care.    FIM:  Comprehension Comprehension Mode: Auditory Comprehension: 2-Understands basic 25 - 49% of the time/requires cueing 51 - 75% of the time Expression Expression Mode: Verbal Expression: 1-Expresses basis less than 25% of the time/requires cueing greater than 75% of the time. Social Interaction Social Interaction: 2-Interacts appropriately 25 - 49% of time - Needs frequent redirection. Problem Solving Problem Solving: 2-Solves basic 25 - 49% of the time - needs direction more than half the time to initiate, plan or complete simple activities Memory Memory: 1-Recognizes or recalls less than 25% of the time/requires cueing greater than 75% of the time FIM - Eating Eating Activity: 4: Help with managing cup/glass  Pain Pain Assessment Pain Assessment: No/denies pain Pain Score: Asleep  Therapy/Group: Individual Therapy  Windell Moulding, M.A. CCC-SLP  Kelsei Defino, Selinda Orion 06/30/2014, 10:49 AM

## 2014-06-30 NOTE — Progress Notes (Signed)
Physical Therapy Session Note (make up session)  Patient Details  Name: Christy Miranda MRN: [id number] Date of Birth: Apr 05, 1921  Today's Date: 06/30/2014 Time: 1194-1740 Time Calculation (min): 45 min  Short Term Goals: Week 2:  PT Short Term Goal 1 (Week 2): = LTGs of overall min A  Skilled Therapeutic Interventions/Progress Updates:   Pt received sitting in w/c in room, having just finished SLP session.  Pt attempting to communicate with therapist, pointing to gown.  Asked pt if she wanted to change clothes, pt verbalized "yes".  Also assisted pt into restroom to attempt to void prior to therapy session.  Performed stand pivot transfer to/from toilet with use of grab bar at mod A level with facilitation for weight shifting to advance LEs.  Pt successful in voiding urine, however requires total A for adjusting clothing and peri care.  Once back in w/c, assisted to sink to stand and wash hands.  Mod A to stand with total A HOH cues for engaging RUE in hand washing.  Also assist for gathering soap, but pt able to reach for paper towels.  Remainder of session focused on gait training with RW.  Pt able to ambulate from room with RW x 50' x 1 and another 76' x 1 at min/mod A level.  Pt requires light facilitation at hips/trunk for weight shifts for improved clearance of RLE during swing phase of gait.  Pt able to maintain R hand position on RW during all gait.  Cues for upright head posture, however pt unable to maintain.  Seated rest breaks following both gait trials.  Pt assisted back to room and left in w/c with all needs in reach and QRB donned.  Pt able to point to red button on call bell when questioned which button to push for assistance.    Therapy Documentation Precautions:  Precautions Precautions: Fall Precaution Comments: Rt sided hemiparesis, aphasia, severe kyphosis/scoliosis, recent pelvic fx, right neglect Restrictions Weight Bearing Restrictions: No LLE Weight Bearing: Weight  bearing as tolerated   Vital Signs: Therapy Vitals Pulse Rate: 75 BP: 151/61 mmHg Pain: Pain Assessment Pain Score: Asleep   Locomotion : Ambulation Ambulation/Gait Assistance: 3: Mod assist;4: Min assist   See FIM for current functional status  Therapy/Group: Individual Therapy  Denice Bors 06/30/2014, 9:57 AM

## 2014-07-01 ENCOUNTER — Inpatient Hospital Stay (HOSPITAL_COMMUNITY): Payer: Medicare Other | Admitting: Speech Pathology

## 2014-07-01 ENCOUNTER — Inpatient Hospital Stay (HOSPITAL_COMMUNITY): Payer: Medicare Other | Admitting: *Deleted

## 2014-07-01 DIAGNOSIS — I4891 Unspecified atrial fibrillation: Secondary | ICD-10-CM

## 2014-07-01 DIAGNOSIS — I10 Essential (primary) hypertension: Secondary | ICD-10-CM

## 2014-07-01 DIAGNOSIS — I6330 Cerebral infarction due to thrombosis of unspecified cerebral artery: Secondary | ICD-10-CM

## 2014-07-01 DIAGNOSIS — Z5189 Encounter for other specified aftercare: Secondary | ICD-10-CM

## 2014-07-01 NOTE — Progress Notes (Signed)
Speech Language Pathology Daily Session Note  Patient Details  Name: Christy Miranda MRN: [id number] Date of Birth: [birth year]  Today's Date: 07/01/2014 Time: 1120-1205 Time Calculation (min): 45 min  Short Term Goals: Week 2: SLP Short Term Goal 1 (Week 2): Patient will consume trials of advanced consistencies with no overt s/s of aspiration and min cuing for use of swallowing precaution for diet upgrade. SLP Short Term Goal 2 (Week 2): Patient will participate in automatic verbal tasks wtih Max verbal and written cues. SLP Short Term Goal 3 (Week 2): Patient will identify named object from a field of 2 with Mod multimodal cues. SLP Short Term Goal 4 (Week 2): Patient will solve basic problems related to self care with Mod multiodal cues.  SLP Short Term Goal 5 (Week 2): Patient will sustain attention to basic self-care tasks for 3-5 minutes with Mod cues for redirection.  Skilled Therapeutic Interventions:  Pt was seen for skilled speech therapy targeting cognitive-linguistic and dysphagia goals.  Upon arrival, pt was upright in wheelchair at nurse's station, awake and alert.  SLP facilitated the session with visual recognition tasks during which pt was able to match object to picture from a field of 2 for <25% accuracy.  Furthermore, pt identified object named from field of 2 with written visual aid and hand over hand assist for pointing with <25% accuracy.  SLP completed skilled observations with presentations of pt's currently prescribed diet with pt exhibiting no overt s/s of aspiration, appropriate oral manipulation of pureed consistencies, and effective clearance of residuals from oral cavity.  Pt was left in wheel chair with nurse tech for full supervision to complete meal.  Continue per current plan of care.    FIM:  Comprehension Comprehension Mode: Auditory Comprehension: 2-Understands basic 25 - 49% of the time/requires cueing 51 - 75% of the time Expression Expression Mode:  Verbal Expression: 1-Expresses basis less than 25% of the time/requires cueing greater than 75% of the time. Social Interaction Social Interaction: 3-Interacts appropriately 50 - 74% of the time - May be physically or verbally inappropriate. Problem Solving Problem Solving: 2-Solves basic 25 - 49% of the time - needs direction more than half the time to initiate, plan or complete simple activities Memory Memory: 1-Recognizes or recalls less than 25% of the time/requires cueing greater than 75% of the time FIM - Eating Eating Activity: 4: Helper checks for pocketed food  Pain Pain Assessment Pain Assessment: No/denies pain  Therapy/Group: Individual Therapy  Windell Moulding, M.A. CCC-SLP  Taleyah Hillman, Selinda Orion 07/01/2014, 3:54 PM

## 2014-07-01 NOTE — Progress Notes (Addendum)
Occupational Therapy Session Note  Patient Details  Name: Christy Miranda MRN: [id number] Date of Birth: 1921-09-01  Today's Date: 07/01/2014 Time:  -   1330-1415   (45 min)    Short Term Goals: Week 1:  OT Short Term Goal 1 (Week 1): Pt will initiate and complete UB bathing with no more than min assist and min instructional cueing in supported sitting. OT Short Term Goal 1 - Progress (Week 1): Met OT Short Term Goal 2 (Week 1): Pt will perform UB dressing with min assist in supported sitting. OT Short Term Goal 2 - Progress (Week 1): Met OT Short Term Goal 3 (Week 1): Pt will perform LB bathing with max assist sit to stand. OT Short Term Goal 3 - Progress (Week 1): Met OT Short Term Goal 4 (Week 1): Pt will maintain static sitting EOB with no more than min assist for 5 mins in preparation for selfcare tasks. OT Short Term Goal 4 - Progress (Week 1): Met OT Short Term Goal 5 (Week 1): Pt will use the RUE as a stabilizer with mod assist and mod instructional cueing to integrate into selfcare tasks.  OT Short Term Goal 5 - Progress (Week 1): Met Week 2:  OT Short Term Goal 1 (Week 2): Short Term Goals = Long Term Goals secondary to ELOS  Skilled Therapeutic Interventions/Progress Updates:    Focus of treatment was bed mobility, transfers,  Neuro-muscular reeducation, sitting balance, standing balance, attention, therapeutic activities, sustained attention, postural control   Pt sitting in wc at nursing station.  Took pt to room.  Did toilet transfer to regular toilet using grab bar.  Pt was minimal assist with transfer.  Utilized RUE as gross assist to wipe after urination.  Pt stood with minimal assist and assisted with donning pants (minimal assist).  Pt. Transferred back to wc with minimal assist with rotational component.  Placed pt at sink and she washed hands with hand over hand assist to attend to right hand.  Provided AAROM to RUE.  Pt. Indicated she was fatiqued.  Transferred to bed with  min assist.  Pt went from sit to supine with minimal assist.  Pt. Remained in bed with bed alarm on and call bell,phone within reach.  Therapy Documentation Precautions:  Precautions Precautions: Fall Precaution Comments: Rt sided hemiparesis, aphasia, severe kyphosis/scoliosis, recent pelvic fx, right neglect Restrictions Weight Bearing Restrictions: No LLE Weight Bearing: Weight bearing as tolerated (pelvic fx)       Pain:  none             See FIM for current functional status  Therapy/Group: Individual Therapy  Lisa Roca 07/01/2014, 1:29 PM

## 2014-07-01 NOTE — Progress Notes (Signed)
80 y.o. right-handed female with history of atrial fibrillation status post pacemaker with chronic Coumadin therapy, recent fall with pelvic fractures and had recently been at skilled nursing facility secondary to pelvic fractures.. Admitted 06/13/2049 with right-sided weakness as well as aphasia. INR on admission of 1.35. Initial cranial CT scan negative for acute changes. Patient did receive TPA. Followup cranial CT scan showed no 3.6 cm left basal ganglia parenchymal hemorrhage consistent with hemorrhagic transformation of acute infarct. 5 mm rightward midline shift. Echocardiogram with ejection fraction of 55% no wall motion abnormalities. Carotid Dopplers with no ICA stenosis. Neurology services consulted with workup ongoing. Aspirin held for post TPA hemorrhage. Repeat cranial CT scan 06/19/2014 shows slight interval decrease in size of hematoma centered in the left putamen, measuring 30 x 28 mm  Subjective/Complaints:   No new issues per RN. Uneventful night  ROS unable secondary to aphasia Objective: Vital Signs: Blood pressure 148/72, pulse 81, temperature 97.8 F (36.6 C), temperature source Oral, resp. rate 18, height 5' (1.524 m), weight 50.984 kg (112 lb 6.4 oz), SpO2 96.00%. Dg Swallowing Func-speech Pathology  06/29/2014   Christy Miranda, CCC-SLP     06/29/2014 11:51 AM   Objective Swallowing Evaluation: Modified Barium Swallowing Study   Patient Details  Name: Christy Miranda MRN: [id number] Date of Birth: 1921/04/28  Today's Date: 06/29/2014 Time: 0900-0930 Time Calculation (min): 30 min  Past Medical History:  Past Medical History  Diagnosis Date  . HTN (hypertension)   . Permanent atrial fibrillation   . Bradycardia     s/p PPM  . Hypothyroidism   . Anxiety   . Diverticulitis     history of  . History of hysterectomy   . Long-term (current) use of anticoagulants   . Scoliosis (and kyphoscoliosis), idiopathic   . DVT (deep vein thrombosis) in pregnancy     right peroneal  . Dizziness and  giddiness 07/22/2013   Past Surgical History:  Past Surgical History  Procedure Laterality Date  . Pacemaker insertion  03/03/2006    most recent generator (MDT) change 10/30/06 by Dr Verlon Setting  . Cardiac catheterization  01/08/1999    normal LV function  . Cardioversion  10/30/2006    Successful elective DC cardioversion  . Cardioversion  12/29/2002    Successful DC cardioversion  . Cardioversion  12/03/1999    Successful DC cardioversion  . Cholecystectomy     HPI:  Christy Miranda is a 80 y.o. female with a history of afib on  coumadin who has recently had difficulty with regulating her INR.  She was seen to be normal at 7:15 am when her tray was taken to  her. At 8 - 8:30 when the tech went to get her tray, she noticed  that she was different and EMS was called.      Recommendation/Prognosis  Clinical Impression:   Dysphagia Diagnosis: Moderate oral phase dysphagia;Moderate  pharyngeal phase dysphagia Clinical impression: Pt's oropharyngeal swallow has made minimal  gains since previous MBS 7/16. Decreased oral strength and  manipulation results in decreased bolus cohesion, with delayed  pharyngeal swallow initiated primarily at the level of the  valleculae with all consistencies tested. During the swallow, pt  was observed to silently penetrate honey and nectar thick  liquids, although honey thick liquids by spoon were primarily  cleared upon completion of the swallow. Of note, pt was unable to  obtain larger quantities of liquid via cup sips, consuming  significantly more liquid per sip when administered via teaspoon  (suspect  due to posture). Pt silently aspirated and penetrated  both honey and nectar thick liquids during this study after the  original swallow while recording was turned off between trials.  Given trace vallecular residuals present, suspect  aspiration/penetration occurred from additional swallows to clear  oral residuals. While pt's aspiration risk remains high with both  honey and nectar thick  liquids, recommend continuing more  conservatively with current diet of Dys 1 textures and honey  thick liquids by spoon as pt appears to have been tolerating this  for the past two weeks.   Swallow Evaluation Recommendations:  Diet Recommendations: Dysphagia 1 (Puree);Honey-thick liquid Liquid Administration via: Spoon Medication Administration: Crushed with puree Supervision: Full supervision/cueing for compensatory  strategies;Patient able to self feed Compensations: Slow rate;Small sips/bites;Check for  pocketing;Check for anterior loss Postural Changes and/or Swallow Maneuvers: Seated upright 90  degrees;Upright 30-60 min after meal;Out of bed for meals Oral Care Recommendations: Oral care BID Other Recommendations: Order thickener from pharmacy;Prohibited  food (jello, ice cream, thin soups);Remove water pitcher Follow up Recommendations: Home health SLP;Skilled Nursing  facility;24 hour supervision/assistance    Prognosis:      Individuals Consulted: Consulted and Agree with Results and  Recommendations: Patient;Other (Comment) (primary SLP)      SLP Assessment/Plan  Plan:  Potential to Achieve Goals: Fair Potential Considerations: Severity of impairments;Previous level  of function;Family/community support   Short Term Goals: Week 2: SLP Short Term Goal 1 (Week 2): Patient  will consume trials of advanced consistencies with no overt s/s  of aspiration and min cuing for use of swallowing precaution for  diet upgrade. SLP Short Term Goal 2 (Week 2): Patient will participate in  automatic verbal tasks wtih Max verbal and written cues. SLP Short Term Goal 3 (Week 2): Patient will identify named  object from a field of 2 with Mod multimodal cues. SLP Short Term Goal 4 (Week 2): Patient will solve basic problems  related to self care with Mod multiodal cues.  SLP Short Term Goal 5 (Week 2): Patient will sustain attention to  basic self-care tasks for 3-5 minutes with Mod cues for  redirection.    General: Date of  Onset: 06/13/14 Type of Study: Modified Barium Swallowing Study Reason for Referral: Objectively evaluate swallowing function Previous Swallow Assessment: BSE 7/14 recommending NPO; MBS  06/15/14 recommending Dys.1 honey-thick liquids via teaspoon Diet Prior to this Study: Dysphagia 1 (puree);Honey-thick liquids Temperature Spikes Noted: No Respiratory Status: Room air History of Recent Intubation: No Behavior/Cognition: Alert;Cooperative;Pleasant mood;Requires  cueing;Distractible;Decreased sustained attention Oral Cavity - Dentition: Adequate natural dentition Self-Feeding Abilities: Able to feed self with adaptive  devices;Needs assist Patient Positioning: Upright in chair Baseline Vocal Quality: Low vocal intensity Volitional Cough: Other (Comment) (reflexive cough weak) Volitional Swallow: Unable to elicit Anatomy: Other (Comment) (head tilted anteriorly throughout  session) Pharyngeal Secretions: Not observed secondary MBS   Reason for Referral:   Objectively evaluate swallowing function    Oral Phase: Oral Preparation/Oral Phase Oral Phase: Impaired Oral - Honey Oral - Honey Teaspoon: Right anterior bolus loss;Weak lingual  manipulation;Lingual pumping;Reduced posterior propulsion;Delayed  oral transit;Lingual/palatal residue Oral - Honey Cup: Right anterior bolus loss;Weak lingual  manipulation;Lingual pumping;Reduced posterior propulsion;Delayed  oral transit;Lingual/palatal residue Oral - Nectar Oral - Nectar Teaspoon: Right anterior bolus loss;Weak lingual  manipulation;Lingual pumping;Reduced posterior propulsion;Delayed  oral transit;Lingual/palatal residue Oral - Nectar Cup: Right anterior bolus loss;Weak lingual  manipulation;Lingual pumping;Reduced posterior propulsion;Delayed  oral transit;Lingual/palatal residue Oral - Nectar Straw: Right anterior bolus loss;Weak lingual  manipulation;Lingual pumping;Reduced posterior propulsion;Delayed  oral transit;Lingual/palatal residue;Other (Comment)  (increased  speed of oral transit with straw as compared to other liquid  consistencies and methods of intake) Oral - Solids Oral - Puree: Right anterior bolus loss;Weak lingual  manipulation;Lingual pumping;Reduced posterior propulsion;Delayed  oral transit;Lingual/palatal residue;Right pocketing in lateral  sulci   Pharyngeal Phase:  Pharyngeal Phase Pharyngeal Phase: Impaired Pharyngeal - Honey Pharyngeal - Honey Teaspoon: Delayed swallow initiation;Reduced  anterior laryngeal mobility;Reduced laryngeal  elevation;Penetration/Aspiration during swallow;Reduced  airway/laryngeal closure;Reduced tongue base  retraction;Pharyngeal residue - valleculae Penetration/Aspiration details (honey teaspoon): Material enters  airway, remains ABOVE vocal cords then ejected out Pharyngeal - Honey Cup: Delayed swallow initiation;Reduced  anterior laryngeal mobility;Reduced laryngeal  elevation;Penetration/Aspiration during swallow;Reduced  airway/laryngeal closure;Penetration/Aspiration after  swallow;Reduced tongue base retraction;Pharyngeal residue -  valleculae Penetration/Aspiration details (honey cup): Material enters  airway, passes BELOW cords without attempt by patient to eject  out (silent aspiration) Pharyngeal - Nectar Pharyngeal - Nectar Teaspoon: Delayed swallow initiation;Reduced  anterior laryngeal mobility;Reduced laryngeal  elevation;Penetration/Aspiration during swallow;Reduced  airway/laryngeal closure;Reduced tongue base  retraction;Pharyngeal residue - valleculae Penetration/Aspiration details (nectar teaspoon): Material enters  airway, remains ABOVE vocal cords and not ejected out Pharyngeal - Nectar Cup: Delayed swallow initiation;Reduced  anterior laryngeal mobility;Reduced laryngeal elevation;Reduced  airway/laryngeal closure;Reduced tongue base  retraction;Pharyngeal residue - valleculae;Penetration/Aspiration  after swallow Penetration/Aspiration details (nectar cup): Material enters  airway, remains  ABOVE vocal cords and not ejected out Pharyngeal - Nectar Straw: Delayed swallow initiation;Reduced  anterior laryngeal mobility;Reduced laryngeal  elevation;Penetration/Aspiration during swallow;Reduced  airway/laryngeal closure;Reduced tongue base  retraction;Pharyngeal residue - valleculae Penetration/Aspiration details (nectar straw): Material enters  airway, remains ABOVE vocal cords and not ejected out Pharyngeal - Solids Pharyngeal - Puree: Delayed swallow initiation;Reduced anterior  laryngeal mobility;Reduced laryngeal elevation;Reduced  airway/laryngeal closure;Reduced tongue base  retraction;Pharyngeal residue - valleculae Penetration/Aspiration details (puree): Material does not enter  airway   Cervical Esophageal Phase  Cervical Esophageal Phase Cervical Esophageal Phase: WFL   GN         Christy Miranda, M.A. CCC-SLP [phone]  Christy Miranda 06/29/2014, 11:50 AM                    Results for orders placed during the hospital encounter of 06/19/14 (from the past 72 hour(s))  BASIC METABOLIC PANEL     Status: Abnormal   Collection Time    06/28/14 12:55 PM      Result Value Ref Range   Sodium 139  137 - 147 mEq/L   Potassium 4.3  3.7 - 5.3 mEq/L   Chloride 99  96 - 112 mEq/L   CO2 25  19 - 32 mEq/L   Glucose, Bld 129 (*) 70 - 99 mg/dL   BUN 13  6 - 23 mg/dL   Creatinine, Ser 0.39 (*) 0.50 - 1.10 mg/dL   Calcium 9.0  8.4 - 10.5 mg/dL   GFR calc non Af Amer 89 (*) >90 mL/min   GFR calc Af Amer >90  >90 mL/min   Comment: (NOTE)     The eGFR has been calculated using the CKD EPI equation.     This calculation has not been validated in all clinical situations.     eGFR's persistently <90 mL/min signify possible Chronic Kidney     Disease.   Anion gap 15  5 - 15      Constitutional:  80 year old frail white female. Head forward posture and leaning to the right. Eyes:  Pupils reactive to light  Neck: Normal range of motion. Neck supple. No thyromegaly present.   Cardiovascular:  Irreg Irreg. No murmur  Respiratory: Effort normal and breath sounds normal. No respiratory distress.  GI: Soft. Bowel sounds are normal. She exhibits no distension. Non-tender Neurological:  Patient is fairly alert. Left gaze preference. She was expressively and receptively aphasic with apraxia. She did follow a few simple commands. Made eye contact when cued.RUE: 2-/5 deltoid, bicep, tricep, 3-/5 finger flex. RLE: 3- HF, KE, and 2- ADF/PF but inconsistent.  Increased tone R biceps MAS2 Skin: Skin is warm and dry  Psych: flat, without agitation Ext- neg edema  Assessment/Plan: 1. Functional deficits secondary to  left basal ganglia parenchymal hemorrhage consistent with hemorrhagic transformation of acute infarct which require 3+ hours per day of interdisciplinary therapy in a comprehensive inpatient rehab setting. Physiatrist is providing close team supervision and 24 hour management of active medical problems listed below. Physiatrist and rehab team continue to assess barriers to discharge/monitor patient progress toward functional and medical goals.   FIM: FIM - Bathing Bathing Steps Patient Completed: Chest;Abdomen;Front perineal area;Buttocks;Left upper leg;Right upper leg Bathing: 3: Mod-Patient completes 5-7 [id number] parts or 50-74%  FIM - Upper Body Dressing/Undressing Upper body dressing/undressing steps patient completed: Thread/unthread left sleeve of pullover shirt/dress;Put head through opening of pull over shirt/dress Upper body dressing/undressing: 3: Mod-Patient completed 50-74% of tasks FIM - Lower Body Dressing/Undressing Lower body dressing/undressing steps patient completed: Thread/unthread left pants leg Lower body dressing/undressing: 1: Total-Patient completed less than 25% of tasks  FIM - TMusicianDevices: Grab bar or rail for support Toileting: 1: Total-Patient completed zero steps, helper did all 3  FIM - TGlass blower/designerDevices: Elevated toilet seat;Grab bars Toilet Transfers: 3-To toilet/BSC: Mod A (lift or lower assist);3-From toilet/BSC: Mod A (lift or lower assist)  FIM - BEngineer, siteAssistive Devices: Arm rests Bed/Chair Transfer: 3: Chair or W/C > Bed: Mod A (lift or lower assist);4: Sit > Supine: Min A (steadying pt. > 75%/lift 1 leg);4: Supine > Sit: Min A (steadying Pt. > 75%/lift 1 leg);3: Bed > Chair or W/C: Mod A (lift or lower assist)  FIM - Locomotion: Wheelchair Locomotion: Wheelchair: 0: Activity did not occur FIM - Locomotion: Ambulation Locomotion: Ambulation Assistive Devices: WAdministratorAmbulation/Gait Assistance: 3: Mod assist;4: Min assist Locomotion: Ambulation: 2: Travels 50 - 149 ft with moderate assistance (Pt: 50 - 74%)  Comprehension Comprehension Mode: Auditory Comprehension: 2-Understands basic 25 - 49% of the time/requires cueing 51 - 75% of the time  Expression Expression Mode: Verbal Expression: 1-Expresses basis less than 25% of the time/requires cueing greater than 75% of the time.  Social Interaction Social Interaction: 2-Interacts appropriately 25 - 49% of time - Needs frequent redirection.  Problem Solving Problem Solving: 2-Solves basic 25 - 49% of the time - needs direction more than half the time to initiate, plan or complete simple activities  Memory Memory: 1-Recognizes or recalls less than 25% of the time/requires cueing greater than 75% of the time  Medical Problem List and Plan:  1. 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 No aspirin at this time repeat CT scan in one week to decide upon initiation of aspirin  2. DVT Prophylaxis/Anticoagulation: SCDs. Monitor for any signs of DVT  3. Pain Management: Tylenol as needed  4. Mood/anxiety. Xanax 0.25 mg 3 times a day as needed. Provide emotional support:  5. Neuropsych: This patient is  not  capable of making decisions on her own behalf.   6. Dysphagia. Dysphagia 1 with honey thick liquids.Followup chemistries look good, holding IVF, repeat MBS no sig changes   7. History of atrial fibrillation/pacemaker with chronic Coumadin. Coumadin held secondary to hemorrhagic transformation x 3-4 wk.  To f/u with Neuro post D/C to determine restart date on warfarin Cardiac rate controlled. Continue Toprol 50 mg twice a day for rate control  8. Recent pelvic fracture. Weightbearing as tolerated. No pain with LE ROM 9. Hypothyroidism. Synthroid 10.  urinary incont, UA neg, trialing anticholinergic  LOS (Days) 12 A FACE TO FACE EVALUATION WAS PERFORMED  Jasdeep Kepner T 07/01/2014, 9:34 AM

## 2014-07-02 ENCOUNTER — Inpatient Hospital Stay (HOSPITAL_COMMUNITY): Payer: Medicare Other | Admitting: Occupational Therapy

## 2014-07-02 ENCOUNTER — Inpatient Hospital Stay (HOSPITAL_COMMUNITY): Payer: Medicare Other | Admitting: *Deleted

## 2014-07-02 NOTE — Progress Notes (Signed)
Occupational Therapy Session Note  Patient Details  Name: Christy Miranda MRN: [id number] Date of Birth: 1920-12-24  Today's Date: 07/02/2014 Time:  -   1600-1700  (60 min)    Short Term Goals: Week 1:  OT Short Term Goal 1 (Week 1): Pt will initiate and complete UB bathing with no more than min assist and min instructional cueing in supported sitting. OT Short Term Goal 1 - Progress (Week 1): Met OT Short Term Goal 2 (Week 1): Pt will perform UB dressing with min assist in supported sitting. OT Short Term Goal 2 - Progress (Week 1): Met OT Short Term Goal 3 (Week 1): Pt will perform LB bathing with max assist sit to stand. OT Short Term Goal 3 - Progress (Week 1): Met OT Short Term Goal 4 (Week 1): Pt will maintain static sitting EOB with no more than min assist for 5 mins in preparation for selfcare tasks. OT Short Term Goal 4 - Progress (Week 1): Met OT Short Term Goal 5 (Week 1): Pt will use the RUE as a stabilizer with mod assist and mod instructional cueing to integrate into selfcare tasks.  OT Short Term Goal 5 - Progress (Week 1): Met Week 2:  OT Short Term Goal 1 (Week 2): Short Term Goals = Long Term Goals secondary to ELOS  Skilled Therapeutic Interventions/Progress Updates:    Addressed BUE AROM, bed mobility, transfers to toilet and BADL.  Performed AROM on LUE and AAROM on RUE.  Pt followed directions with 80 % accuracy.  Daughter present.  Pt was minimal assist with supine to sit and transfer to wc>toilet>wc.  Pt. Stood with minimal assist for peri care.  Pt. Needed assist donning pants.  Washed hands at sink with minimal assist on right.  Set up dinner tray and left with call bell in place and daughter in room.      Therapy Documentation Precautions:  Precautions Precautions: Fall Precaution Comments: Rt sided hemiparesis, aphasia, severe kyphosis/scoliosis, recent pelvic fx, right neglect Restrictions Weight Bearing Restrictions: No LLE Weight Bearing: Weight bearing as  tolerated (pelvic fx) General: General Amount of Missed OT Time (min): 30 Minutes   Pain: Pain Assessment Pain Assessment: Faces Faces Pain Scale: Hurts little more Pain Descriptors / Indicators: Headache Pain Intervention(s): Medication (See eMAR)      :    See FIM for current functional status  Therapy/Group: Individual Therapy  Lisa Roca 07/02/2014, 4:12 PM

## 2014-07-02 NOTE — Progress Notes (Signed)
0955 Pt. Used call bell for assistance for toileting.  Pt. partly incontinent-- then continent of stool once on the toilet.

## 2014-07-02 NOTE — Progress Notes (Signed)
80 y.o. right-handed female with history of atrial fibrillation status post pacemaker with chronic Coumadin therapy, recent fall with pelvic fractures and had recently been at skilled nursing facility secondary to pelvic fractures.. Admitted 06/13/2049 with right-sided weakness as well as aphasia. INR on admission of 1.35. Initial cranial CT scan negative for acute changes. Patient did receive TPA. Followup cranial CT scan showed no 3.6 cm left basal ganglia parenchymal hemorrhage consistent with hemorrhagic transformation of acute infarct. 5 mm rightward midline shift. Echocardiogram with ejection fraction of 55% no wall motion abnormalities. Carotid Dopplers with no ICA stenosis. Neurology services consulted with workup ongoing. Aspirin held for post TPA hemorrhage. Repeat cranial CT scan 06/19/2014 shows slight interval decrease in size of hematoma centered in the left putamen, measuring 30 x 28 mm  Subjective/Complaints:   Up sitting in chair. No distress  ROS unable secondary to aphasia Objective: Vital Signs: Blood pressure 167/66, pulse 79, temperature 97.4 F (36.3 C), temperature source Oral, resp. rate 18, height 5' (1.524 m), weight 50.984 kg (112 lb 6.4 oz), SpO2 100.00%. No results found. No results found for this or any previous visit (from the past 72 hour(s)).    Constitutional:  80 year old frail white female. Head forward posture and leaning to the right. Eyes:  Pupils reactive to light  Neck: Normal range of motion. Neck supple. No thyromegaly present.  Cardiovascular:  Irreg Irreg. No murmur  Respiratory: Effort normal and breath sounds normal. No respiratory distress.  GI: Soft. Bowel sounds are normal. She exhibits no distension. Non-tender Neurological:  Patient is fairly alert. Left gaze preference. She was expressively and receptively aphasic with apraxia. She did follow a few simple commands. Made eye contact when cued.RUE: 2-/5 deltoid, bicep, tricep, 3-/5 finger  flex. RLE: 3- HF, KE, and 2- ADF/PF but inconsistent.  Increased tone R biceps MAS2  Skin: Skin is warm and dry  Psych: flat, without agitation Ext- neg edema  Assessment/Plan: 1. Functional deficits secondary to  left basal ganglia parenchymal hemorrhage consistent with hemorrhagic transformation of acute infarct which require 3+ hours per day of interdisciplinary therapy in a comprehensive inpatient rehab setting. Physiatrist is providing close team supervision and 24 hour management of active medical problems listed below. Physiatrist and rehab team continue to assess barriers to discharge/monitor patient progress toward functional and medical goals.   FIM: FIM - Bathing Bathing Steps Patient Completed: Chest;Abdomen;Front perineal area;Buttocks;Left upper leg;Right upper leg Bathing: 3: Mod-Patient completes 5-7 53f 10 parts or 50-74%  FIM - Upper Body Dressing/Undressing Upper body dressing/undressing steps patient completed: Thread/unthread left sleeve of pullover shirt/dress;Put head through opening of pull over shirt/dress Upper body dressing/undressing: 3: Mod-Patient completed 50-74% of tasks FIM - Lower Body Dressing/Undressing Lower body dressing/undressing steps patient completed: Thread/unthread left pants leg Lower body dressing/undressing: 1: Total-Patient completed less than 25% of tasks  FIM - Toileting Toileting steps completed by patient: Performs perineal hygiene Toileting Assistive Devices: Grab bar or rail for support Toileting: 2: Max-Patient completed 1 of 3 steps  FIM - Radio producer Devices: Grab bars Toilet Transfers: 4-To toilet/BSC: Min A (steadying Pt. > 75%);4-From toilet/BSC: Min A (steadying Pt. > 75%)  FIM - Bed/Chair Transfer Bed/Chair Transfer Assistive Devices: Arm rests Bed/Chair Transfer: 4: Chair or W/C > Bed: Min A (steadying Pt. > 75%);4: Sit > Supine: Min A (steadying pt. > 75%/lift 1 leg)  FIM - Locomotion:  Wheelchair Locomotion: Wheelchair: 0: Activity did not occur FIM - Locomotion: Ambulation Locomotion: Ambulation  Assistive Devices: Administrator Ambulation/Gait Assistance: 3: Mod assist;4: Min assist Locomotion: Ambulation: 2: Travels 50 - 149 ft with moderate assistance (Pt: 50 - 74%)  Comprehension Comprehension Mode: Auditory Comprehension: 2-Understands basic 25 - 49% of the time/requires cueing 51 - 75% of the time  Expression Expression Mode: Verbal Expression: 1-Expresses basis less than 25% of the time/requires cueing greater than 75% of the time.  Social Interaction Social Interaction: 2-Interacts appropriately 25 - 49% of time - Needs frequent redirection.  Problem Solving Problem Solving: 2-Solves basic 25 - 49% of the time - needs direction more than half the time to initiate, plan or complete simple activities  Memory Memory: 1-Recognizes or recalls less than 25% of the time/requires cueing greater than 75% of the time  Medical Problem List and Plan:  1. 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 No aspirin at this time repeat CT scan in one week to decide upon initiation of aspirin  2. DVT Prophylaxis/Anticoagulation: SCDs. Monitor for any signs of DVT  3. Pain Management: Tylenol as needed  4. Mood/anxiety. Xanax 0.25 mg 3 times a day as needed. Provide emotional support:  5. Neuropsych: This patient is not capable of making decisions on her own behalf.   6. Dysphagia. Dysphagia 1 with honey thick liquids.Followup chemistries look good, holding IVF, repeat MBS no sig changes   7. History of atrial fibrillation/pacemaker with chronic Coumadin. Coumadin held secondary to hemorrhagic transformation x 3-4 wk.  To f/u with Neuro post D/C to determine restart date on warfarin Cardiac rate controlled. Continue Toprol 50 mg twice a day for rate control  8. Recent pelvic fracture. Weightbearing as tolerated. No pain with LE  ROM 9. Hypothyroidism. Synthroid 10.  urinary incont, UA neg, trialing anticholinergic which seems to help somewhat  LOS (Days) 13 A FACE TO FACE EVALUATION WAS PERFORMED  SWARTZ,ZACHARY T 07/02/2014, 8:41 AM

## 2014-07-02 NOTE — Plan of Care (Signed)
Problem: RH BLADDER ELIMINATION Goal: RH STG MANAGE BLADDER WITH ASSISTANCE STG Manage Bladder With max Assistance  Outcome: Progressing Pt. Successful with timed toileting q 2-3 hrs

## 2014-07-02 NOTE — Progress Notes (Signed)
Occupational Therapy Note  Patient Details  Name: Christy Miranda MRN: [id number] Date of Birth: 07-23-1921 Today's Date: 07/02/2014  Pt missed 30 mins skilled OT treatment session secondary to refusal.  Pt in bed upon arrival with grandson at bedside.  Encouraged pt to participate in OOB treatment to which she refused, upon further encouragement and various attempts pt continued to refuse.  Despite aphasia, able to understand that pt fatigue and pain in Lt hip/pelvic area. Grandson recommended she rest and attempt later. Notified RN of pain.  Attempted to follow up later in afternoon with pt asleep.   Simonne Come 07/02/2014, 3:12 PM

## 2014-07-03 ENCOUNTER — Encounter (HOSPITAL_COMMUNITY): Payer: Medicare Other | Admitting: Occupational Therapy

## 2014-07-03 ENCOUNTER — Inpatient Hospital Stay (HOSPITAL_COMMUNITY): Payer: Medicare Other | Admitting: Physical Therapy

## 2014-07-03 ENCOUNTER — Inpatient Hospital Stay (HOSPITAL_COMMUNITY): Payer: Medicare Other | Admitting: Speech Pathology

## 2014-07-03 DIAGNOSIS — I10 Essential (primary) hypertension: Secondary | ICD-10-CM

## 2014-07-03 DIAGNOSIS — I4891 Unspecified atrial fibrillation: Secondary | ICD-10-CM

## 2014-07-03 DIAGNOSIS — Z5189 Encounter for other specified aftercare: Secondary | ICD-10-CM

## 2014-07-03 DIAGNOSIS — I6330 Cerebral infarction due to thrombosis of unspecified cerebral artery: Secondary | ICD-10-CM

## 2014-07-03 NOTE — Progress Notes (Signed)
NUTRITION FOLLOW-UP  INTERVENTION:  Discontinue Ensure Pudding po BID, each supplement provides 170 kcal and 4 grams of protein.   Recommend weighing pt to obtain a more accurate weight trend.   Provide Magic Cup TID between meals.  NUTRITION DIAGNOSIS: Inadequate oral intake related to pelvic fractures and CVA as evidenced by suspected weight loss, ongoing.  Goal: Pt to meet >/= 90% of their estimated nutrition needs, ongoing  Monitor:  Weight trends, PO intake, I/O's, labs  80 y.o. female  Admitting Dx: <principal problem not specified>  ASSESSMENT: 80 y.o. right-handed female with history of atrial fibrillation status post pacemaker with chronic Coumadin therapy, recent fall with pelvic fractures and had recently been at skilled nursing facility secondary to pelvic fractures.. Admitted 06/13/2049 with right-sided weakness as well as aphasia.  7/28-Pt was difficult to obtain history from. Per Nurse Tech, pt had an egg for breakfast and ate 100%. Meal completion is recorded as 100%. Pt's weight has been trending down since April 2015, but suspect that most recent weight is incorrect. Pt is on a Dysphagia 1 diet with honey-thickened liquids. Pt with no signs of significant fat and/or muscle wasting.   8/3-Spoke with pt and pt's family. Pt reports she has a good appetite and has been eating around 50% of her meals. Meal completion 50-75%. Family reports she does not like her Ensure Pudding and refuses to eat them. Will discontinue. Family does report that the pt does enjoy Magic cup. Will continue to provide magic cup for pt. Pt was encouraged to eat more during meals.  Weighed pt on bed scale, which reported to be 106 lbs. Questionable if bed scale is accurate.   Spoke with RN about pt and he reports he will continue to provide pt with Magic cup and agreeable to discontinuing the Ensure Pudding.   Height: Ht Readings from Last 1 Encounters:  06/19/14 5' (1.524 m)     Weight: Wt Readings from Last 1 Encounters:  06/21/14 112 lb 6.4 oz (50.984 kg)    Wt Readings from Last 10 Encounters:  06/21/14 112 lb 6.4 oz (50.984 kg)  06/13/14 89 lb (40.37 kg)  05/22/14 98 lb 12.3 oz (44.8 kg)  03/13/14 105 lb (47.628 kg)  12/19/13 105 lb 6.4 oz (47.809 kg)  11/28/13 103 lb 9.6 oz (46.993 kg)  11/14/13 104 lb 6.4 oz (47.356 kg)  10/21/13 105 lb 12.8 oz (47.991 kg)  08/25/13 105 lb 12.8 oz (47.991 kg)  07/22/13 104 lb (47.174 kg)    BMI:  Body mass index is 21.95 kg/(m^2).  Re-Estimated Nutritional Needs: Kcal: 1200-1400 Protein: 75-85 g Fluid: >1.4 L/day  Skin: +1 LE edema  Diet Order: Dysphagia 1 with honey thick liquids   Intake/Output Summary (Last 24 hours) at 07/03/14 1544 Last data filed at 07/03/14 1200  Gross per 24 hour  Intake    440 ml  Output      0 ml  Net    440 ml    Last BM: 8/2  Labs:   Recent Labs Lab 06/28/14 1255  NA 139  K 4.3  CL 99  CO2 25  BUN 13  CREATININE 0.39*  CALCIUM 9.0  GLUCOSE 129*    CBG (last 3)  No results found for this basename: GLUCAP,  in the last 72 hours  Scheduled Meds: . antiseptic oral rinse  15 mL Mouth Rinse q12n4p  . chlorhexidine  15 mL Mouth Rinse BID  . feeding supplement (ENSURE)  1 Container Oral BID  BM  . levothyroxine  50 mcg Oral QAC breakfast  . metoprolol tartrate  50 mg Oral BID  . oxybutynin  5 mg Oral QHS  . pantoprazole sodium  40 mg Per Tube QHS    Continuous Infusions:   Past Medical History  Diagnosis Date  . HTN (hypertension)   . Permanent atrial fibrillation   . Bradycardia     s/p PPM  . Hypothyroidism   . Anxiety   . Diverticulitis     history of  . History of hysterectomy   . Long-term (current) use of anticoagulants   . Scoliosis (and kyphoscoliosis), idiopathic   . DVT (deep vein thrombosis) in pregnancy     right peroneal  . Dizziness and giddiness 07/22/2013    Past Surgical History  Procedure Laterality Date  . Pacemaker  insertion  03/03/2006    most recent generator (MDT) change 10/30/06 by Dr Verlon Setting  . Cardiac catheterization  01/08/1999    normal LV function  . Cardioversion  10/30/2006    Successful elective DC cardioversion  . Cardioversion  12/29/2002    Successful DC cardioversion  . Cardioversion  12/03/1999    Successful DC cardioversion  . Cholecystectomy      Kallie Locks, MS, Provisional LDN Pager # [phone] After hours/ weekend pager # [phone]

## 2014-07-03 NOTE — Progress Notes (Signed)
Occupational Therapy Session Note  Patient Details  Name: Christy Miranda MRN: [id number] Date of Birth: 07-12-1921  Today's Date: 07/03/2014 Time: 0900-1000 Time Calculation (min): 60 min  Short Term Goals: Week 2:  OT Short Term Goal 1 (Week 2): Short Term Goals = Long Term Goals secondary to ELOS  Skilled Therapeutic Interventions/Progress Updates:    Pt found in bed incontinent of bowel.  She was able to try and tell the therapist she was soiled but unable to get out tangible words when talking.  Pt rolled to the right side with mod assist while therapist helped clean her up.  She then transitioned to the EOB with mod assist.  Stand pivot to the wheelchair with mod assist for bathing.  She performed bathing at the sink with mod instructional cueing to sequence.  Mod to max assist needed for integration of the RUE to wash the left arm and some of her LEs.  Pt still with significant impairments with motor apraxia when attempting to RUE functional use.   Pt unable to locate items placed or presented in the right visual field, even when given max instructional cueing to scan.  Pt also unable to carryover hemidressing techniques.  Max to total assist for LB dressing with min assist for UB dressing.    Therapy Documentation Precautions:  Precautions Precautions: Fall Precaution Comments: Rt sided hemiparesis, aphasia, severe kyphosis/scoliosis, recent pelvic fx, right neglect Restrictions Weight Bearing Restrictions: No LLE Weight Bearing: Weight bearing as tolerated  Pain: Pain Assessment Pain Assessment: No/denies pain Pain Score: 0-No pain Faces Pain Scale: Hurts little more Pain Descriptors / Indicators: Headache Pain Intervention(s): Medication (See eMAR) ADL: See FIM for current functional status  Therapy/Group: Individual Therapy  Ronia Hazelett OTR/L 07/03/2014, 12:22 PM

## 2014-07-03 NOTE — Progress Notes (Signed)
Physical Therapy Session Note  Patient Details  Name: Christy Miranda MRN: [id number] Date of Birth: 11-23-1921  Today's Date: 07/03/2014 Time: 6010-9323 Time Calculation (min): 60 min  Short Term Goals: Week 2:  PT Short Term Goal 1 (Week 2): = LTGs of overall min A  Skilled Therapeutic Interventions/Progress Updates:   Pt received sitting in w/c, handoff from SLP. Pt initially refusing therapist, but agreeable with max coaxing. Pt appearing to be in distress, pointing at nurse call button. Therapist informed by RN that she just received pain medication. In therapy gym, pt ambulated x 20 ft and x 10 ft using pediatric RW and min A, therapist facilitating weightshift to L for improved R LE clearance. NuStep for RLE NMR for coordination, timing, and sequencing using BLEs and LUE x 15 min, L1-L2.  Pt performed stand pivot transfer NuStep <> w/c with min A. Pt transported to day room in preparation for ambulation in home environment. Pt stating, " No. Too tired. Can't. Tomorrow." Pt verified that she was fatigued and would attempt tomorrow with "yes." Returned to patient room for toileting. Pt performed stand pivot transfer w/c <> toilet with use of grab bar and min guard, min-mod A for clothing management, and total A for hygiene. Pt washed hands at sink level with supervision and performed stand pivot transfer w/c > bed with min guard, sit > supine and repositioned higher up in bed with supervision. Pt requesting 4 bed rails up, left semi reclined in bed with all needs within reach. RN notified patient continent of urine.   Therapy Documentation Precautions:  Precautions Precautions: Fall Precaution Comments: Rt sided hemiparesis, aphasia, severe kyphosis/scoliosis, recent pelvic fx, right neglect Restrictions Weight Bearing Restrictions: No LLE Weight Bearing: Weight bearing as tolerated Vital Signs: Therapy Vitals Temp: 97.5 F (36.4 C) Temp src: Oral Pulse Rate: 84 Resp: 17 BP: 164/62  mmHg Patient Position (if appropriate): Sitting Oxygen Therapy SpO2: 99 % O2 Device: None (Room air) Pain: Pain Assessment Pain Assessment: 0-10 Pain Score: 9  Pain Type: Chronic pain Pain Location: Back Pain Orientation: Upper Pain Descriptors / Indicators: Aching Pain Frequency: Intermittent Pain Onset: On-going Patients Stated Pain Goal: 2 Pain Intervention(s): Medication (See eMAR)  See FIM for current functional status  Therapy/Group: Individual Therapy  Laretta Alstrom 07/03/2014, 3:07 PM

## 2014-07-03 NOTE — Progress Notes (Signed)
Occupational Therapy Session Note  Patient Details  Name: Christy Miranda MRN: [id number] Date of Birth: March 03, 1921  Today's Date: 07/03/2014 Time: 1302-1332 Time Calculation (min): 30 min  Short Term Goals: Week 2:  OT Short Term Goal 1 (Week 2): Short Term Goals = Long Term Goals secondary to ELOS  Skilled Therapeutic Interventions/Progress Updates:    Treatment session with focus on toilet transfer, toileting, and standing balance.  Pt in w/c upon arrival, attempting to tell something to therapist.  After multiple questions, pt replied yes to needing to toilet.  Performed stand pivot transfer to toilet with use of grab bar with min assist.  Pt utilized RUE as stabilizer on grab bar while attempting to perform hygiene with Lt hand.  Min guard with static standing while therapist assisted with hygiene and clothing management.  Pt with increased spontaneous attempts to incorporate RUE into tasks, however due to ataxia not always successful.  Completed 2 grooming tasks in standing with min assist to maintain standing and mod verbal cues for sequencing.  Therapy Documentation Precautions:  Precautions Precautions: Fall Precaution Comments: Rt sided hemiparesis, aphasia, severe kyphosis/scoliosis, recent pelvic fx, right neglect Restrictions Weight Bearing Restrictions: No LLE Weight Bearing: Weight bearing as tolerated Pain: Pain Assessment Pain Assessment: 0-10 Pain Score: 9  Pain Type: Chronic pain Pain Location: Back Pain Orientation: Upper Pain Descriptors / Indicators: Aching Pain Frequency: Intermittent Pain Onset: On-going Patients Stated Pain Goal: 2 Pain Intervention(s): Medication (See eMAR)  See FIM for current functional status  Therapy/Group: Individual Therapy  Simonne Come 07/03/2014, 2:43 PM

## 2014-07-03 NOTE — Progress Notes (Signed)
Speech Language Pathology Daily Session Note  Patient Details  Name: Christy Miranda MRN: [id number] Date of Birth: 1921/05/19  Today's Date: 07/03/2014 Time: 1224-8250 Time Calculation (min): 30 min  Short Term Goals: Week 2: SLP Short Term Goal 1 (Week 2): Patient will consume trials of advanced consistencies with no overt s/s of aspiration and min cuing for use of swallowing precaution for diet upgrade. SLP Short Term Goal 2 (Week 2): Patient will participate in automatic verbal tasks wtih Max verbal and written cues. SLP Short Term Goal 3 (Week 2): Patient will identify named object from a field of 2 with Mod multimodal cues. SLP Short Term Goal 4 (Week 2): Patient will solve basic problems related to self care with Mod multiodal cues.  SLP Short Term Goal 5 (Week 2): Patient will sustain attention to basic self-care tasks for 3-5 minutes with Mod cues for redirection.  Skilled Therapeutic Interventions: Skilled treatment session focused on addressing speech-language goals. Upon entering room patient verbally expressing self; however unintelligible and required Total assist for awareness of her verbal errors.  SLP facilitated the session with naming basic ALD objects with written aids which patient expressed with ~80% accuracy.  Patient then identified named object from a field of two with 70% following Max multimodal cues.  No family present for education today; continue with current plan of care.    FIM:  Comprehension Comprehension Mode: Auditory Comprehension: 2-Understands basic 25 - 49% of the time/requires cueing 51 - 75% of the time Expression Expression Mode: Verbal Expression: 1-Expresses basis less than 25% of the time/requires cueing greater than 75% of the time. Social Interaction Social Interaction: 2-Interacts appropriately 25 - 49% of time - Needs frequent redirection. Problem Solving Problem Solving: 2-Solves basic 25 - 49% of the time - needs direction more than half  the time to initiate, plan or complete simple activities Memory Memory: 1-Recognizes or recalls less than 25% of the time/requires cueing greater than 75% of the time  Pain Pain Assessment Pain Assessment: No/denies pain Pain Score: 0-No pain  Therapy/Group: Individual Therapy  Carmelia Roller., [place] [phone]  Carlos 07/03/2014, 5:15 PM

## 2014-07-03 NOTE — Progress Notes (Signed)
80 y.o. right-handed female with history of atrial fibrillation status post pacemaker with chronic Coumadin therapy, recent fall with pelvic fractures and had recently been at skilled nursing facility secondary to pelvic fractures.. Admitted 06/13/2049 with right-sided weakness as well as aphasia. INR on admission of 1.35. Initial cranial CT scan negative for acute changes. Patient did receive TPA. Followup cranial CT scan showed no 3.6 cm left basal ganglia parenchymal hemorrhage consistent with hemorrhagic transformation of acute infarct. 5 mm rightward midline shift. Echocardiogram with ejection fraction of 55% no wall motion abnormalities. Carotid Dopplers with no ICA stenosis. Neurology services consulted with workup ongoing. Aspirin held for post TPA hemorrhage. Repeat cranial CT scan 06/19/2014 shows slight interval decrease in size of hematoma centered in the left putamen, measuring 30 x 28 mm  Subjective/Complaints:   Sitting up in bed. No complaints. No distress.  ROS unable secondary to aphasia Objective: Vital Signs: Blood pressure 132/64, pulse 76, temperature 98.9 F (37.2 C), temperature source Oral, resp. rate 18, height 5' (1.524 m), weight 50.984 kg (112 lb 6.4 oz), SpO2 99.00%. No results found. No results found for this or any previous visit (from the past 72 hour(s)).    Constitutional:  80 year old frail white female. Head forward posture and leaning to the right. Eyes:  Pupils reactive to light  Neck: Normal range of motion. Neck supple. No thyromegaly present.  Cardiovascular:  Irreg Irreg. No murmur  Respiratory: Effort normal and breath sounds normal. No respiratory distress.  GI: Soft. Bowel sounds are normal. She exhibits no distension. Non-tender Neurological:  Patient is fairly alert. Left gaze preference. She was expressively and receptively aphasic with apraxia. She did follow a few simple commands. Made eye contact when cued.RUE: 2-/5 deltoid, bicep, tricep,  3-/5 finger flex. RLE: 3- HF, KE, and 2- ADF/PF but inconsistent.   R biceps and wirst  MAS 1-2  Skin: Skin is warm and dry  Psych: flat, without agitation Ext- neg edema  Assessment/Plan: 1. Functional deficits secondary to  left basal ganglia parenchymal hemorrhage consistent with hemorrhagic transformation of acute infarct which require 3+ hours per day of interdisciplinary therapy in a comprehensive inpatient rehab setting. Physiatrist is providing close team supervision and 24 hour management of active medical problems listed below. Physiatrist and rehab team continue to assess barriers to discharge/monitor patient progress toward functional and medical goals.   FIM: FIM - Bathing Bathing Steps Patient Completed: Chest;Abdomen;Front perineal area;Buttocks;Left upper leg;Right upper leg Bathing: 3: Mod-Patient completes 5-7 30f 10 parts or 50-74%  FIM - Upper Body Dressing/Undressing Upper body dressing/undressing steps patient completed: Thread/unthread left sleeve of pullover shirt/dress;Put head through opening of pull over shirt/dress Upper body dressing/undressing: 3: Mod-Patient completed 50-74% of tasks FIM - Lower Body Dressing/Undressing Lower body dressing/undressing steps patient completed: Thread/unthread left pants leg Lower body dressing/undressing: 2: Max-Patient completed 25-49% of tasks  FIM - Toileting Toileting steps completed by patient: Performs perineal hygiene Toileting Assistive Devices: Grab bar or rail for support Toileting: 2: Max-Patient completed 1 of 3 steps  FIM - Radio producer Devices: Grab bars Toilet Transfers: 4-To toilet/BSC: Min A (steadying Pt. > 75%);4-From toilet/BSC: Min A (steadying Pt. > 75%)  FIM - Bed/Chair Transfer Bed/Chair Transfer Assistive Devices: Arm rests Bed/Chair Transfer: 4: Chair or W/C > Bed: Min A (steadying Pt. > 75%);4: Sit > Supine: Min A (steadying pt. > 75%/lift 1 leg)  FIM -  Locomotion: Wheelchair Locomotion: Wheelchair: 0: Activity did not occur FIM - Locomotion:  Ambulation Locomotion: Ambulation Assistive Devices: Administrator Ambulation/Gait Assistance: 3: Mod assist;4: Min assist Locomotion: Ambulation: 2: Travels 50 - 149 ft with moderate assistance (Pt: 50 - 74%)  Comprehension Comprehension Mode: Auditory Comprehension: 2-Understands basic 25 - 49% of the time/requires cueing 51 - 75% of the time  Expression Expression Mode: Verbal Expression: 1-Expresses basis less than 25% of the time/requires cueing greater than 75% of the time.  Social Interaction Social Interaction: 2-Interacts appropriately 25 - 49% of time - Needs frequent redirection.  Problem Solving Problem Solving: 2-Solves basic 25 - 49% of the time - needs direction more than half the time to initiate, plan or complete simple activities  Memory Memory: 1-Recognizes or recalls less than 25% of the time/requires cueing greater than 75% of the time  Medical Problem List and Plan:  1. 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 No aspirin at this time repeat CT scan in one week to decide upon initiation of aspirin  2. DVT Prophylaxis/Anticoagulation: SCDs. Monitor for any signs of DVT  3. Pain Management: Tylenol as needed  4. Mood/anxiety. Xanax 0.25 mg 3 times a day as needed. Provide emotional support:  5. Neuropsych: This patient is not capable of making decisions on her own behalf.   6. Dysphagia. Dysphagia 1 with honey thick liquids.  -intake fairly good all considering  -recent labs normal---recheck later this week 7. History of atrial fibrillation/pacemaker with chronic Coumadin. Coumadin held secondary to hemorrhagic transformation x 3-4 wk.  To f/u with Neuro post D/C to determine restart date on warfarin Cardiac rate controlled. Continue Toprol 50 mg twice a day for rate control  8. Recent pelvic fracture. Weightbearing as  tolerated. No pain with LE ROM 9. Hypothyroidism. Synthroid 10.  urinary incont, UA neg, trialing anticholinergic which seems to be helping  LOS (Days) 14 A FACE TO FACE EVALUATION WAS PERFORMED  SWARTZ,ZACHARY T 07/03/2014, 9:15 AM

## 2014-07-04 ENCOUNTER — Inpatient Hospital Stay (HOSPITAL_COMMUNITY): Payer: Medicare Other | Admitting: Physical Therapy

## 2014-07-04 ENCOUNTER — Encounter (HOSPITAL_COMMUNITY): Payer: Medicare Other | Admitting: Occupational Therapy

## 2014-07-04 ENCOUNTER — Inpatient Hospital Stay (HOSPITAL_COMMUNITY): Payer: Medicare Other | Admitting: Speech Pathology

## 2014-07-04 DIAGNOSIS — Z5189 Encounter for other specified aftercare: Secondary | ICD-10-CM

## 2014-07-04 DIAGNOSIS — I6330 Cerebral infarction due to thrombosis of unspecified cerebral artery: Secondary | ICD-10-CM

## 2014-07-04 DIAGNOSIS — I4891 Unspecified atrial fibrillation: Secondary | ICD-10-CM

## 2014-07-04 DIAGNOSIS — I10 Essential (primary) hypertension: Secondary | ICD-10-CM

## 2014-07-04 NOTE — Progress Notes (Signed)
Occupational Therapy Session Note  Patient Details  Name: Christy Miranda MRN: [id number] Date of Birth: Dec 21, 1920  Today's Date: 07/04/2014 Time: 1106-1206 Time Calculation (min): 60 min  Short Term Goals: Week 1:  OT Short Term Goal 1 (Week 1): Pt will initiate and complete UB bathing with no more than min assist and min instructional cueing in supported sitting. OT Short Term Goal 1 - Progress (Week 1): Met OT Short Term Goal 2 (Week 1): Pt will perform UB dressing with min assist in supported sitting. OT Short Term Goal 2 - Progress (Week 1): Met OT Short Term Goal 3 (Week 1): Pt will perform LB bathing with max assist sit to stand. OT Short Term Goal 3 - Progress (Week 1): Met OT Short Term Goal 4 (Week 1): Pt will maintain static sitting EOB with no more than min assist for 5 mins in preparation for selfcare tasks. OT Short Term Goal 4 - Progress (Week 1): Met OT Short Term Goal 5 (Week 1): Pt will use the RUE as a stabilizer with mod assist and mod instructional cueing to integrate into selfcare tasks.  OT Short Term Goal 5 - Progress (Week 1): Met  Week 2:  OT Short Term Goal 1 (Week 2): Short Term Goals = Long Term Goals secondary to ELOS  Skilled Therapeutic Interventions/Progress Updates:  Patient received seated in w/c with NT present. No signs of pain. Patient with increased frustration and ready to get back to bed secondary to fatigue (patient with 2 hrs therapy prior to this OT session). Therapist encouraged patient and patient willing to work with therapist. Patient with difficulty with functional communicated. Therapist did understand when patient requested to use bathroom. Therapist propelled patient into BR and patient transferred w/c>elevated toilet seat with minimal assistance. Patient with urine continence, stood for peri care, then transferred back to w/c. Therapist attempted to get patient in shower, but patient refused by shaking head and stating "No, I can't do that".  Therapist assisted patient back to room and assisted patient with UB/LB dressing, patient willing to complete this task. Patient donned shirt with minimal assistance and required total assist for LB dressing. Therapist engaged patient in functional communication activity, patient inconsistently able to accurately name and point to field of 2 items. Therapist assisted patient back to bed, positioned patient with RUE on pillow, and left patient supine in bed with all needs within reach and bed alarm set. During session, focused skilled intervention on ADL retraining, functional use of RUE, activity tolerance/endurance, attention, problem solving, and functional communication.   Precautions:  Precautions Precautions: Fall Precaution Comments: Rt sided hemiparesis, aphasia, severe kyphosis/scoliosis, recent pelvic fx, right neglect Restrictions Weight Bearing Restrictions: No LLE Weight Bearing: Weight bearing as tolerated (pelvic fx)  See FIM for current functional status  Therapy/Group: Individual Therapy  [name] 07/04/2014, 12:10 PM

## 2014-07-04 NOTE — Progress Notes (Signed)
Speech Language Pathology Weekly Progress and Session Note  Patient Details  Name: Christy Miranda MRN: [id number] Date of Birth: 02-06-1921  Beginning of progress report period: June 27, 2014 End of progress report period: July 04, 2014  Today's Date: 07/04/2014 Time: 6283-1517 Time Calculation (min): 60 min  Short Term Goals: Week 2: SLP Short Term Goal 1 (Week 2): Patient will consume trials of advanced consistencies with no overt s/s of aspiration and min cuing for use of swallowing precaution for diet upgrade. SLP Short Term Goal 1 - Progress (Week 2): Discontinued (comment) (per MBS, pt not appropriate for diet advancement at this time secondary to silent aspiration ) SLP Short Term Goal 2 (Week 2): Patient will participate in automatic verbal tasks wtih Max verbal and written cues. SLP Short Term Goal 2 - Progress (Week 2): Partly met SLP Short Term Goal 3 (Week 2): Patient will identify named object from a field of 2 with Mod multimodal cues. SLP Short Term Goal 3 - Progress (Week 2): Revised due to lack of progress SLP Short Term Goal 4 (Week 2): Patient will solve basic problems related to self care with Mod multiodal cues.  SLP Short Term Goal 4 - Progress (Week 2): Revised due to lack of progress SLP Short Term Goal 5 (Week 2): Patient will sustain attention to basic self-care tasks for 3-5 minutes with Mod cues for redirection. SLP Short Term Goal 5 - Progress (Week 2): Met    New Short Term Goals: Week 3: SLP Short Term Goal 1 (Week 3): Pt will consume her currently prescribed diet with no overt s/s of aspiration with overall min-mod assist verbal cues for use of precautions SLP Short Term Goal 2 (Week 3): Patient will participate in automatic verbal tasks wtih Max verbal and written cues. SLP Short Term Goal 3 (Week 3): Patient will identify named object from a field of 2 with Max multimodal cues. SLP Short Term Goal 4 (Week 3): Patient will solve basic problems related to  self care with Max multimodal cues.  SLP Short Term Goal 5 (Week 3): Patient will sustain attention to basic self-care tasks for 5-7 minutes with Mod cues for redirection.  Weekly Progress Updates:  Pt made limited progress this reporting period and has met 1 out of 5 short term goals secondary to improved sustained attention during functional tasks.  Overall, pt currently requires mod-max assist for cognitive-linguistic tasks and is utilizing her recommended swallowing precautions with min-mod assist and full supervision to minimize overt s/s of aspiration with dys 1 textures and honey thick liquids via teaspoon.  Pt and family education is ongoing.  Pt would continue to benefit from skilled speech therapy targeting cognitive-linguistic function and swallowing safety in order to maximize functional independence and reduce burden of care upon discharge.    Intensity: Minumum of 1-2 x/day, 30 to 90 minutes Frequency: 5 out of 7 days Duration/Length of Stay: ~14 days Treatment/Interventions: Cognitive remediation/compensation;Cueing hierarchy;Dysphagia/aspiration precaution training;Environmental controls;Functional tasks;Internal/external aids;Multimodal communication approach;Oral motor exercises;Patient/family education;Speech/Language facilitation;Therapeutic Activities   Daily Session  Skilled Therapeutic Interventions: Pt was seen for skilled speech therapy targeting cognitive-linguistic goals.  Upon arrival, pt was seated upright in wheelchair with initial attempts to refuse therapy.  Pt was easily redirected to participate in loosely structured therapeutic tasks involving her self care including planning and sequencing a toilet transfer and completing hand hygiene with overall mod assist to initiate and complete tasks in a timely manner.  During structured tasks targeting expressive-receptive aphasia, pt was  approximately 80-90% accurate for naming to complete automatic sequences with overall  min-mod assist, ~80% accurate for matching picture to object from a field of two with min assist, ~75% accurate for confrontational naming of basic objects with max assist and written visual aids.  SLP also facilitated session with skilled apraxia drills with pt able to produce vowel repetitions with 80% accuracy and CV monosyllabics for 50-75% accuracy with bilabials with max assist phonemic placement cues.   Goals updated on this date to reflect current plan of care and progress in therapy.        FIM:  Comprehension Comprehension Mode: Auditory Comprehension: 2-Understands basic 25 - 49% of the time/requires cueing 51 - 75% of the time Expression Expression Mode: Verbal Expression: 1-Expresses basis less than 25% of the time/requires cueing greater than 75% of the time. Social Interaction Social Interaction: 2-Interacts appropriately 25 - 49% of time - Needs frequent redirection. Problem Solving Problem Solving: 2-Solves basic 25 - 49% of the time - needs direction more than half the time to initiate, plan or complete simple activities Memory Memory: 1-Recognizes or recalls less than 25% of the time/requires cueing greater than 75% of the time  Pain Pain Assessment Pain Assessment: Faces Faces Pain Scale: No hurt   Therapy/Group: Individual Therapy  Omer Monter, Selinda Orion 07/04/2014, 12:49 PM

## 2014-07-04 NOTE — Progress Notes (Signed)
Physical Therapy Session Note  Patient Details  Name: Christy Miranda MRN: [id number] Date of Birth: [birth year]  Today's Date: 07/04/2014 Time: 0830-0930 Time Calculation (min): 60 min  Short Term Goals: Week 2:  PT Short Term Goal 1 (Week 2): = LTGs of overall min A  Skilled Therapeutic Interventions/Progress Updates:   Pt received semi reclined in bed, appearing distressed and touching abdomen. With palpation of mid-left lower abdomen, patient grimacing in pain. RN notified, reports patient had bowel movement this AM and will notify MD/PA. With increased time, patient transferred supine > sit with HOB slightly elevated., min A to scoot to EOB. Stand pivot transfer bed > w/c with min A. Pt performed stand pivot transfer w/c <> toilet with grab bar, supervision for standing balance while therapist managed brief and gown, total A for hygiene. Gait training x 25 ft using RW and min A in home environment. Pt perfomed static and dynamic standing balance with close supervision-min A with RW for UE support. W/c mobility x 30 ft using LUE and max A. Pt with continued grimacing in pain and touching abdomen throughout session, provided frequent rest breaks as needed. Pt left sitting up in w/c with quick release belt on, all needs within reach.  Therapy Documentation Precautions:  Precautions Precautions: Fall Precaution Comments: Rt sided hemiparesis, aphasia, severe kyphosis/scoliosis, recent pelvic fx, right neglect Restrictions Weight Bearing Restrictions: No LLE Weight Bearing: Weight bearing as tolerated (pelvic fx) Pain: Pain Assessment Faces Pain Scale: Hurts even more Pain Type: Acute pain Pain Location: Abdomen Pain Orientation: Lower;Mid Pain Descriptors / Indicators: Grimacing Pain Onset: Unable to tell Pain Intervention(s): RN made aware;Ambulation/increased activity;Other (Comment) (toileting)  See FIM for current functional status  Therapy/Group: Individual Therapy  Laretta Alstrom 07/04/2014, 11:53 AM

## 2014-07-04 NOTE — Progress Notes (Signed)
Social Work Patient ID: Christy Miranda, female   DOB: 08/15/1921, 80 y.o.   MRN: [id number]  CSW spoke with pt's dtr via telephone and she feels that it would be best for pt to transfer to a SNF initially before they hire help in the home.  Dtr has toured [facility] and would like for CSW to pursue this facility first.  CSW cautioned dtr that we need a backup facility in case they cannot accept pt.  Dtr expressed understanding and will be prepared to make that decision as needed.  CSW faxed FL2 and other pertinent information to Admissions at [facility].  CSW will await their response and proceed accordingly to facilitate pt's d/c plan.

## 2014-07-04 NOTE — Progress Notes (Signed)
80 y.o. right-handed female with history of atrial fibrillation status post pacemaker with chronic Coumadin therapy, recent fall with pelvic fractures and had recently been at skilled nursing facility secondary to pelvic fractures.. Admitted 06/13/2049 with right-sided weakness as well as aphasia. INR on admission of 1.35. Initial cranial CT scan negative for acute changes. Patient did receive TPA. Followup cranial CT scan showed no 3.6 cm left basal ganglia parenchymal hemorrhage consistent with hemorrhagic transformation of acute infarct. 5 mm rightward midline shift. Echocardiogram with ejection fraction of 55% no wall motion abnormalities. Carotid Dopplers with no ICA stenosis. Neurology services consulted with workup ongoing. Aspirin held for post TPA hemorrhage. Repeat cranial CT scan 06/19/2014 shows slight interval decrease in size of hematoma centered in the left putamen, measuring 30 x 28 mm  Subjective/Complaints:   No new problems. Fairly alert. No distress ROS unable secondary to aphasia Objective: Vital Signs: Blood pressure 139/72, pulse 76, temperature 97.9 F (36.6 C), temperature source Oral, resp. rate 18, height 5' (1.524 m), weight 50.984 kg (112 lb 6.4 oz), SpO2 97.00%. No results found. No results found for this or any previous visit (from the past 72 hour(s)).    Constitutional:  80 year old frail white female. Head forward posture and leaning to the right. Eyes:  Pupils reactive to light  Neck: Normal range of motion. Neck supple. No thyromegaly present.  Cardiovascular:  Irreg Irreg. No murmur  Respiratory: Effort normal and breath sounds normal. No respiratory distress.  GI: Soft. Bowel sounds are normal. She exhibits no distension. Non-tender Neurological:  Patient is fairly alert. Left gaze preference persists. She was expressively and receptively aphasic with apraxia.  . Made eye contact when cued.RUE: 2-/5 deltoid, bicep, tricep, 3-/5 finger flex. RLE: 3- HF,  KE, and 2- ADF/PF but inconsistent.   R biceps and wirst  MAS 1-2  Skin: Skin is warm and dry  Psych: flat, without agitation Ext- neg edema  Assessment/Plan: 1. Functional deficits secondary to  left basal ganglia parenchymal hemorrhage consistent with hemorrhagic transformation of acute infarct which require 3+ hours per day of interdisciplinary therapy in a comprehensive inpatient rehab setting. Physiatrist is providing close team supervision and 24 hour management of active medical problems listed below. Physiatrist and rehab team continue to assess barriers to discharge/monitor patient progress toward functional and medical goals.   FIM: FIM - Bathing Bathing Steps Patient Completed: Chest;Right Arm;Abdomen;Buttocks;Right upper leg;Left upper leg Bathing: 3: Mod-Patient completes 5-7 84f 10 parts or 50-74%  FIM - Upper Body Dressing/Undressing Upper body dressing/undressing steps patient completed: Thread/unthread left sleeve of pullover shirt/dress;Put head through opening of pull over shirt/dress;Thread/unthread right sleeve of pullover shirt/dresss Upper body dressing/undressing: 4: Min-Patient completed 75 plus % of tasks FIM - Lower Body Dressing/Undressing Lower body dressing/undressing steps patient completed: Thread/unthread left pants leg Lower body dressing/undressing: 1: Total-Patient completed less than 25% of tasks  FIM - Toileting Toileting steps completed by patient: Adjust clothing prior to toileting Toileting Assistive Devices: Grab bar or rail for support Toileting: 2: Max-Patient completed 1 of 3 steps  FIM - Radio producer Devices: Grab bars Toilet Transfers: 4-To toilet/BSC: Min A (steadying Pt. > 75%);4-From toilet/BSC: Min A (steadying Pt. > 75%)  FIM - Bed/Chair Transfer Bed/Chair Transfer Assistive Devices: Arm rests Bed/Chair Transfer: 4: Supine > Sit: Min A (steadying Pt. > 75%/lift 1 leg);4: Bed > Chair or W/C: Min A  (steadying Pt. > 75%)  FIM - Locomotion: Wheelchair Locomotion: Wheelchair: 1: Total Assistance/staff pushes  wheelchair (Pt<25%) FIM - Locomotion: Ambulation Locomotion: Ambulation Assistive Devices: Walker - Rolling Ambulation/Gait Assistance: 4: Min assist Locomotion: Ambulation: 1: Travels less than 50 ft with minimal assistance (Pt.>75%)  Comprehension Comprehension Mode: Auditory Comprehension: 2-Understands basic 25 - 49% of the time/requires cueing 51 - 75% of the time  Expression Expression Mode: Verbal Expression: 1-Expresses basis less than 25% of the time/requires cueing greater than 75% of the time.  Social Interaction Social Interaction: 2-Interacts appropriately 25 - 49% of time - Needs frequent redirection.  Problem Solving Problem Solving: 2-Solves basic 25 - 49% of the time - needs direction more than half the time to initiate, plan or complete simple activities  Memory Memory: 1-Recognizes or recalls less than 25% of the time/requires cueing greater than 75% of the time  Medical Problem List and Plan:  1. 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 No aspirin at this time repeat CT scan in one week to decide upon initiation of aspirin  2. DVT Prophylaxis/Anticoagulation: SCDs. Monitor for any signs of DVT  3. Pain Management: Tylenol as needed  4. Mood/anxiety. Xanax 0.25 mg 3 times a day as needed. Provide emotional support:  5. Neuropsych: This patient is not capable of making decisions on her own behalf.   6. Dysphagia. Dysphagia 1 with honey thick liquids.  -intake fairly good all considering  -recent labs normal---recheck wednesday 7. History of atrial fibrillation/pacemaker with chronic Coumadin. Coumadin held secondary to hemorrhagic transformation x 3-4 wk.  To f/u with Neuro post D/C to determine restart date on warfarin Cardiac rate controlled. Continue Toprol 50 mg twice a day for rate control  8. Recent  pelvic fracture. Weightbearing as tolerated. No pain with LE ROM 9. Hypothyroidism. Synthroid 10.  urinary incont, UA neg, trialing anticholinergic which seems to be helping  LOS (Days) 15 A FACE TO FACE EVALUATION WAS PERFORMED  SWARTZ,ZACHARY T 07/04/2014, 9:22 AM

## 2014-07-04 NOTE — Progress Notes (Signed)
Note/chart reviewed. Agree with note.   Cyndie Woodbeck RD, LDN, CNSC [phone] Pager [phone] After Hours Pager   

## 2014-07-05 ENCOUNTER — Inpatient Hospital Stay (HOSPITAL_COMMUNITY): Payer: Medicare Other

## 2014-07-05 ENCOUNTER — Inpatient Hospital Stay (HOSPITAL_COMMUNITY): Payer: Medicare Other | Admitting: Physical Therapy

## 2014-07-05 ENCOUNTER — Inpatient Hospital Stay (HOSPITAL_COMMUNITY): Payer: Medicare Other | Admitting: Occupational Therapy

## 2014-07-05 ENCOUNTER — Inpatient Hospital Stay (HOSPITAL_COMMUNITY): Payer: Medicare Other | Admitting: Speech Pathology

## 2014-07-05 DIAGNOSIS — Z5189 Encounter for other specified aftercare: Secondary | ICD-10-CM

## 2014-07-05 DIAGNOSIS — I6330 Cerebral infarction due to thrombosis of unspecified cerebral artery: Secondary | ICD-10-CM

## 2014-07-05 DIAGNOSIS — I4891 Unspecified atrial fibrillation: Secondary | ICD-10-CM

## 2014-07-05 DIAGNOSIS — I10 Essential (primary) hypertension: Secondary | ICD-10-CM

## 2014-07-05 LAB — BASIC METABOLIC PANEL
Anion gap: 13 (ref 5–15)
BUN: 11 mg/dL (ref 6–23)
CHLORIDE: 102 meq/L (ref 96–112)
CO2: 27 meq/L (ref 19–32)
CREATININE: 0.47 mg/dL — AB (ref 0.50–1.10)
Calcium: 8.7 mg/dL (ref 8.4–10.5)
GFR calc Af Amer: >90 mL/min (ref >90)
GFR calc non Af Amer: 84 mL/min — ABNORMAL LOW (ref >90)
GLUCOSE: 91 mg/dL (ref 70–99)
POTASSIUM: 4.6 meq/L (ref 3.7–5.3)
Sodium: 142 mEq/L (ref 137–147)

## 2014-07-05 LAB — CBC
HEMATOCRIT: 37.2 % (ref 36.0–46.0)
HEMOGLOBIN: 11.8 g/dL — AB (ref 12.0–15.0)
MCH: 31.7 pg (ref 26.0–34.0)
MCHC: 31.7 g/dL (ref 30.0–36.0)
MCV: 100 fL (ref 78.0–100.0)
Platelets: 144 10*3/uL — ABNORMAL LOW (ref 150–400)
RBC: 3.72 MIL/uL — ABNORMAL LOW (ref 3.87–5.11)
RDW: 15.9 % — AB (ref 11.5–15.5)
WBC: 3.6 10*3/uL — AB (ref 4.0–10.5)

## 2014-07-05 LAB — CLOSTRIDIUM DIFFICILE BY PCR: Toxigenic C. Difficile by PCR: NEGATIVE

## 2014-07-05 MED ORDER — SODIUM CHLORIDE 0.9 % IJ SOLN: 10.0000 mL | INTRAMUSCULAR | Status: DC | PRN

## 2014-07-05 MED ORDER — ASPIRIN 81 MG PO CHEW
81.0000 mg | CHEWABLE_TABLET | Freq: Every day | ORAL | Status: DC
  Administered 2014-07-06: 81 mg via ORAL
  Filled 2014-07-05: qty 1

## 2014-07-05 MED ORDER — SODIUM CHLORIDE 0.45 % IV SOLN
INTRAVENOUS | Status: DC
  Administered 2014-07-05: 18:00:00 via INTRAVENOUS
  Filled 2014-07-05 (×2): qty 1000

## 2014-07-05 NOTE — Plan of Care (Signed)
Problem: RH Grooming Goal: LTG Patient will perform grooming w/assist,cues/equip (OT) LTG: Patient will perform grooming with assist, with/without cues using equipment (OT)  Outcome: Not Met (add Reason) Min A to comb hair secondary to pt unable to incorporate R hand to assist. Only combing L side and middle of head during session.   Problem: RH Dressing Goal: LTG Patient will perform lower body dressing w/assist (OT) LTG: Patient will perform lower body dressing with assist, with/without cues in positioning using equipment (OT)  Outcome: Not Applicable Date Met:  01/21/78 This session pt required total A for LB dressing secondary to decreased ability to motor plan activity. Pt given verbal cues and increased time for activity but unable to perform tasks without assistance  Problem: RH Tub/Shower Transfers Goal: LTG Patient will perform tub/shower transfers w/assist (OT) LTG: Patient will perform tub/shower transfers with assist, with/without cues using equipment (OT)  Outcome: Not Met (add Reason) Pt refusal for shower transfers

## 2014-07-05 NOTE — Progress Notes (Signed)
Social Work Patient ID: Christy Miranda, female   DOB: 1921-06-23, 80 y.o.   MRN: [id number]  CSW met with pt and spoke with dtr via telephone to let them know that MD feels pt can tx to SNF whenever a bed is available.  [facility] is able to accept pt tomorrow, 07-06-14, and pt was very happy at the idea of making a step closer to going home.  It seems that 3 hours of therapy has become a lot for pt and pt and dtr are agreeable to more therapy, but pt is excited about the intensity decreasing somewhat.  Dtr to complete admission paperwork for SNF and CSW will arrange transport tomorrow.

## 2014-07-05 NOTE — Patient Care Conference (Signed)
Inpatient RehabilitationTeam Conference and Plan of Care Update Date: 07/05/2014   Time: 10:30 AM    Patient Name: Christy Miranda      Medical Record Number: [id number]  Date of Birth: October 20, 1921 Sex: Female         Room/Bed: 4W17C/4W17C-01 Payor Info: Payor: MEDICARE / Plan: MEDICARE PART A AND B / Product Type: *No Product type* /    Admitting Diagnosis: L CVA  Admit Date/Time:  06/19/2014  6:51 PM Admission Comments: No comment available   Primary Diagnosis:  <principal problem not specified> Principal Problem: <principal problem not specified>  Patient Active Problem List   Diagnosis Date Noted  . CVA (cerebral infarction) 06/19/2014  . Undernutrition 06/19/2014  . Fracture of multiple pubic rami 05/22/2014  . Encounter for therapeutic drug monitoring 01/02/2014  . Dizziness and giddiness 07/22/2013  . Thrombocytopenia 11/16/2012  . Deep venous thrombosis of lower leg 11/13/2012  . Subtherapeutic international normalized ratio (INR) 11/13/2012  . Pancytopenia 11/13/2012  . Edema 11/07/2012  . Pacemaker-Medtronic 08/19/2012  . Current use of long term anticoagulation 08/08/2011  . Atrial fibrillation 02/28/2011  . Tachycardia-bradycardia syndrome 02/28/2011  . Hypertension 02/28/2011    Expected Discharge Date: Expected Discharge Date: 07/06/14  Team Members Present: Physician leading conference: Dr. Alger Simons Social Worker Present: Alfonse Alpers, LCSW Nurse Present: Elliot Cousin, RN PT Present: Carney Living, PT OT Present: Benay Pillow, OT SLP Present: Windell Moulding, SLP PPS Coordinator present : Daiva Nakayama, RN, CRRN;Becky Alwyn Ren, PT     Current Status/Progress Goal Weekly Team Focus  Medical   slow progress. ?functional decline this week---mood component  improve activity tolerance,   nutrition, hydration, mood   Bowel/Bladder   Incontinent of bowel and bladder LBM8/01/2014  Manage bowel and bladder, timed toileting   Assess bowel and bladder movements.    Swallow/Nutrition/ Hydration   dys 1 textures, honey thick liquids via teaspoon, min assist   least restrictive PO with min assist   carryover of swallowing precautions    ADL's   Min - Mod A for functional transfers, Total A for LB self care, Min A for UB dressing, Min A for grooming at sink side  overall mod assist for LB selfcare and functional transfers except for shower transfers which are set at max assist level currently.  ADL retraining, initiation, neuro re - ed, pt/family edu, functional transfer/mobility, and problem solving   Mobility   min A overall for stand pivot transfers, bed mobility, gait up to 30 ft with RW  min A transfers, bed mobility, ambulation up to 50 ft with LRAD  participation, transfers, gait, w/c positioning, activity tolerance   Communication   max-total assist for receptive and expressive functional communication   max assist   automatic sequences, responsive naming, recognition of object    Safety/Cognition/ Behavioral Observations  max assist   max assist   awareness of perseveration and timely cessation of tasks    Pain   pt c/o of pain, could not give a accurate information concering her pain level or where  pain is.  Pain <3  Assess pt for verbal and non verbal signs of pain   Skin   Scab to L ankle, bruising to R lower leg, left eye bruising   Keep skin free of infection and breakdown.  Asses skin q shift for break down and infection.    Rehab Goals Patient on target to meet rehab goals: Yes Rehab Goals Revised: None *See Care Plan and progress notes for long  and short-term goals.  Barriers to Discharge: profound functional and linguistic deficits    Possible Resolutions to Barriers:  full time care    Discharge Planning/Teaching Needs:  Plan for pt has changed to SNF.  As long as pt is medically stable for d/c, she should transfer to SNF 07-06-14.  None - SNF tx   Team Discussion:  Pt to receive a midline today for IV fluids to continue at  night.  Pt has attempted to decline PT at times, but they have encouraged her to keep working and she has been able to in large part.  Pt was incontinent of bowel today with OT and is at total assist for lower body dressing, with mod assist with other ADLs.  Speech progress has been slow, but pt can communicate a little more and has used the call bell on occasion.   Revisions to Treatment Plan:  None   Continued Need for Acute Rehabilitation Level of Care: The patient requires daily medical management by a physician with specialized training in physical medicine and rehabilitation for the following conditions: Daily direction of a multidisciplinary physical rehabilitation program to ensure safe treatment while eliciting the highest outcome that is of practical value to the patient.: Yes Daily medical management of patient stability for increased activity during participation in an intensive rehabilitation regime.: Yes Daily analysis of laboratory values and/or radiology reports with any subsequent need for medication adjustment of medical intervention for : Neurological problems;Cardiac problems  [name], [name] 07/06/2014, 10:03 AM

## 2014-07-05 NOTE — Progress Notes (Signed)
Occupational Therapy Discharge Summary  Patient Details  Name: Christy Miranda MRN: [id number] Date of Birth: 03-11-1921  Today's Date: 07/05/2014 Time: 9242-6834 Time Calculation (min): 60 min  Patient has met 12 of 14 long term goals due to improved activity tolerance, improved balance, ability to compensate for deficits, improved attention and improved awareness.  Patient to discharge at overall Mod Assist level.  Patient discharging to skilled nursing facility in order to continue to address functional deficits.   Reasons goals not met: Pt numerous refusal of shower transfers during stay in inpatient rehabilitation as well as decreased use of R UE for functional activity.   Recommendation:  Patient will benefit from ongoing skilled OT services in skilled nursing facility setting to continue to advance functional skills in the area of BADL and Reduce care partner burden.  Equipment: No equipment provided  Reasons for discharge: discharge from hospital to skilled nursing facility to address remaining functional deficits.  Patient/family agrees with progress made and goals achieved: Yes  OT skilled intervention/treatment session: Pt with no signs or symptoms of pain during session. OT session focused on ADL retraining, dynamic sitting balance, dynamic standing balance, sit <> stands, and functional use of R UE for tasks. Upon entering the room, pt supine in bed attempting to alert therapist of clothing. Pt was incontinent of bowel and required assistance for clean up. Bathing and dressing performed seated EOB. Attempted to put pt into shower but pt clearly stated, "No, I can't. I can't." Once seated EOB pt perseverated on washing of face and required verbal cues to initiate movement onto cleaning of additional body parts. Pt able to follow commands to utilize R UE for functional tasks 50% of the time. Therapist providing hand over hand assist to use R UE to wash R LE body parts. R UE utilized to  wash L UE without issue and min verbal cues only. Pt required Min A for balance to stand with assist to adjust clothing as well as to use R UE for tasks. Min A for UB dressing to pull shirt down right side of  trunk. Pt becoming very frustrated at end of session and attempting to speak to therapist but words were unintelligible. Increased apraxia also continues to be barrier towards goal completion.   OT Discharge Precautions/Restrictions  Precautions Precautions: Fall Precaution Comments: Rt sided hemiparesis, aphasia, severe kyphosis/scoliosis, recent pelvic fx, right neglect Restrictions Weight Bearing Restrictions: No LLE Weight Bearing: Weight bearing as tolerated Pain Pain Assessment Pain Assessment: Faces Faces Pain Scale: No hurt  Vision/Perception  Vision- History Baseline Vision/History: Wears glasses Wears Glasses: Reading only Vision- Assessment Vision Assessment?: Yes Eye Alignment: Impaired (comment) Ocular Range of Motion: Restricted looking up Alignment/Gaze Preference: Chin down Tracking/Visual Pursuits: Decreased smoothness of vertical tracking;Decreased smoothness of horizontal tracking Saccades: Decreased speed of saccadic movement Convergence: Within functional limits Visual Fields: Right visual field deficit  Cognition Overall Cognitive Status: Difficult to assess Arousal/Alertness: Awake/alert Orientation Level:  (unable to assess scondary to global aphasia) Attention: Sustained Focused Attention: Appears intact Sustained Attention: Impaired Sustained Attention Impairment: Functional basic Memory: Impaired Memory Impairment:  (unable to assess secondary to global aphasia) Awareness: Impaired Awareness Impairment: Intellectual impairment Problem Solving: Impaired Problem Solving Impairment: Functional basic Behaviors: Perseveration;Poor frustration tolerance Safety/Judgment: Impaired Comments: unable to formally assess cognition secondary to global  aphasia.  Sensation Sensation Light Touch: Impaired by gross assessment Proprioception: Impaired by gross assessment Coordination Gross Motor Movements are Fluid and Coordinated: No Fine Motor Movements are Fluid and Coordinated:  No Motor  Motor Motor: Hemiplegia;Motor apraxia;Abnormal postural alignment and control Motor - Skilled Clinical Observations: Pt. with rounded, forward posture when in seated position.  Mobility  Bed Mobility Bed Mobility: Supine to Sit Rolling Right: 4: Min assist  Trunk/Postural Assessment  Cervical Assessment Cervical Assessment: Exceptions to [facility] Cervical Strength Overall Cervical Strength Comments: Pt maintains cervical flexion  Thoracic Assessment Thoracic Assessment: Exceptions to [facility] Thoracic Strength Overall Thoracic Strength Comments: Pt with severe kyphosis and scoliosis in the thoracic region with forward shoulder rounding and scapular abduction bilaterally Lumbar Assessment Lumbar Assessment: Exceptions to [facility] Lumbar Strength Overall Lumbar Strength Comments: Pt with decreased lumbar extension to neutral, maintains slight flexion as well.  Balance Dynamic Sitting Balance Dynamic Sitting - Balance Support: Feet supported Dynamic Sitting - Level of Assistance: 5: Stand by assistance Extremity/Trunk Assessment RUE Strength RUE Overall Strength Comments: difficulty with opposition of all digits and decrease in functional use secondary to apraxia. LUE Assessment LUE Assessment: Within Functional Limits  See FIM for current functional status  Christy Miranda 07/05/2014, 12:47 PM

## 2014-07-05 NOTE — Discharge Summary (Signed)
Discharge summary job # [id number]

## 2014-07-05 NOTE — Progress Notes (Signed)
Physical Therapy Discharge Summary  Patient Details  Name: Marirose Deveney MRN: [id number] Date of Birth: 29-Mar-1921  Patient has met 6 of 7 long term goals due to improved activity tolerance, improved balance, improved postural control, increased strength, ability to compensate for deficits, functional use of  right upper extremity and right lower extremity, improved attention, improved awareness and improved coordination.  Patient to discharge at a wheelchair level min assist. Patient care partner unavailable to provide the necessary physical and cognitive assistance at discharge. Patient to discharge to SNF for short-term rehab before returning to daughter's home with hired help.   Reasons goals not met: Car transfer not practiced due to d/c to SNF  Recommendation:  Patient will benefit from ongoing skilled PT services in skilled nursing facility setting to continue to advance safe functional mobility, address ongoing impairments in activity tolerance, dynamic balance, functional transfers, functional use of RUE/LE, ambulation using pediatric RW, strength, and cognition, and minimize fall risk.  Equipment: No equipment provided  Reasons for discharge: treatment goals met and discharge from hospital  Patient/family agrees with progress made and goals achieved: Yes  PT Discharge Precautions/Restrictions Precautions Precautions: Fall Precaution Comments: Rt sided hemiparesis, aphasia, severe kyphosis/scoliosis, recent pelvic fx, right neglect Restrictions Weight Bearing Restrictions: No LLE Weight Bearing: Weight bearing as tolerated Pain Pain Assessment Pain Assessment: Faces Faces Pain Scale: No hurt Vision/Perception  Vision - Assessment Eye Alignment: Impaired (comment) Ocular Range of Motion: Restricted looking up Alignment/Gaze Preference: Chin down Tracking/Visual Pursuits: Decreased smoothness of vertical tracking;Decreased smoothness of horizontal tracking Saccades:  Decreased speed of saccadic movement Convergence: Within functional limits  Cognition Overall Cognitive Status: Difficult to assess Arousal/Alertness: Awake/alert Orientation Level:  (unable to assess scondary to global aphasia) Attention: Sustained Focused Attention: Appears intact Sustained Attention: Impaired Sustained Attention Impairment: Functional basic Memory: Impaired Memory Impairment:  (unable to assess secondary to global aphasia) Awareness: Impaired Awareness Impairment: Intellectual impairment Problem Solving: Impaired Problem Solving Impairment: Functional basic Behaviors: Perseveration;Poor frustration tolerance Safety/Judgment: Impaired Comments: unable to formally assess cognition secondary to global aphasia.  Sensation Sensation Light Touch: Impaired by gross assessment Stereognosis: Not tested Hot/Cold: Not tested Proprioception: Impaired by gross assessment Additional Comments: Sensation difficult to assess secondary to expressive aphasia Coordination Gross Motor Movements are Fluid and Coordinated: No Fine Motor Movements are Fluid and Coordinated: No Coordination and Movement Description: Pt able to perform gross movements of RLE, demonstrates apraxic movement patterns with RUE with commands for functional tasks Motor  Motor Motor: Hemiplegia;Motor apraxia;Abnormal postural alignment and control Motor - Skilled Clinical Observations: Pt with severely flexed kyphotic posture in sitting and standing in the thoracic and cervical areas.  Right hemiplegia and decreased motor planning  Mobility Bed Mobility Bed Mobility: Sit to Supine;Supine to Sit Rolling Right: 4: Min assist Supine to Sit: 5: Supervision;HOB elevated (slightly elevated for comfort due to fixed postural impairments) Sit to Supine: 5: Supervision;HOB elevated (HOB slightly elevated for comfort due to fixed postural impairments) Transfers Transfers: Yes Stand Pivot Transfers: 4: Min  assist;With armrests Locomotion  Ambulation Ambulation: Yes Ambulation/Gait Assistance: 4: Min assist Ambulation Distance (Feet): 130 Feet Assistive device: Rolling walker Ambulation/Gait Assistance Details: Manual facilitation for weight shifting Ambulation/Gait Assistance Details: therapist facilitating weightshift L for RLE clearance Gait Gait: Yes Gait Pattern: Impaired Gait Pattern: Step-through pattern;Decreased stride length;Decreased hip/knee flexion - right;Decreased hip/knee flexion - left;Decreased weight shift to left;Shuffle;Lateral trunk lean to right;Lateral trunk lean to left;Decreased trunk rotation;Narrow base of support;Trunk flexed Gait velocity: decreased Stairs / Additional Locomotion  Stairs: No Product manager Mobility: No (due to short stature and inability to reach ground with LLE for L hemi technique )  Trunk/Postural Assessment  Cervical Assessment Cervical Assessment: Exceptions to [facility] Cervical Strength Overall Cervical Strength Comments: Pt maintains cervical flexion and protraction with lateral flexion to the right side Thoracic Assessment Thoracic Assessment: Exceptions to [facility] Thoracic Strength Overall Thoracic Strength Comments: Pt with severe kyphosis and scoliosis in the thoracic region with forward shoulder rounding and scapular abduction bilaterally Lumbar Assessment Lumbar Assessment: Exceptions to [facility] Lumbar Strength Overall Lumbar Strength Comments: Pt with decreased lumbar extension to neutral, maintains slight flexion Postural Control Postural Control: Deficits on evaluation Head Control: Pt requires max instructional cuing to achieve neutral position for forward gaze and upright head, unable to maintain position > 15 sec Postural Limitations: Severe kyphosis with thoracic rounding,  decreased ability to activate trunk flexors or extensors.  Balance Balance Balance Assessed: Yes Static Sitting Balance Static Sitting -  Balance Support: Feet supported Static Sitting - Level of Assistance: 6: Modified independent (Device/Increase time) Dynamic Sitting Balance Dynamic Sitting - Balance Support: Feet supported Dynamic Sitting - Level of Assistance: 5: Stand by assistance Static Standing Balance Static Standing - Balance Support: Left upper extremity supported;During functional activity Static Standing - Level of Assistance: 5: Stand by assistance;4: Min assist Extremity Assessment  RUE Assessment RUE Assessment: Exceptions to [facility] RUE Strength RUE Overall Strength Comments: Pt with slight increased swelling in the digits and hand but does not restrict AROM.  AROM WFLS for all joints including the shoulder and elbow.  She is able to exhibit 80% gross grasp and release in the hand but difficulty using in function secondary to apraxia.  Brunnstrum stage III in the arm with slight increased tone noted in the biceps.   LUE Assessment LUE Assessment: Within Functional Limits RLE Assessment RLE Assessment: Exceptions to [facility] RLE Strength RLE Overall Strength: Deficits;Due to impaired cognition RLE Overall Strength Comments: Pt unable to perform hip flexion/ankle DF against gravity, functionally able to ambulate with min A using RW LLE Assessment LLE Assessment: Exceptions to [facility] LLE Strength LLE Overall Strength Comments: grossly WFL, difficult to asses due to cognitive impairments  See FIM for current functional status  Laretta Alstrom 07/05/2014, 1:07 PM

## 2014-07-05 NOTE — Progress Notes (Signed)
80 y.o. right-handed female with history of atrial fibrillation status post pacemaker with chronic Coumadin therapy, recent fall with pelvic fractures and had recently been at skilled nursing facility secondary to pelvic fractures.. Admitted 06/13/2049 with right-sided weakness as well as aphasia. INR on admission of 1.35. Initial cranial CT scan negative for acute changes. Patient did receive TPA. Followup cranial CT scan showed no 3.6 cm left basal ganglia parenchymal hemorrhage consistent with hemorrhagic transformation of acute infarct. 5 mm rightward midline shift. Echocardiogram with ejection fraction of 55% no wall motion abnormalities. Carotid Dopplers with no ICA stenosis. Neurology services consulted with workup ongoing. Aspirin held for post TPA hemorrhage. Repeat cranial CT scan 06/19/2014 shows slight interval decrease in size of hematoma centered in the left putamen, measuring 30 x 28 mm  Subjective/Complaints:   No new problems. Fairly alert. No distress ROS unable secondary to aphasia Objective: Vital Signs: Blood pressure 140/81, pulse 70, temperature 98.1 F (36.7 C), temperature source Oral, resp. rate 18, height 5' (1.524 m), weight 50.984 kg (112 lb 6.4 oz), SpO2 96.00%. No results found. Results for orders placed during the hospital encounter of 06/19/14 (from the past 72 hour(s))  CBC     Status: Abnormal   Collection Time    07/05/14  5:30 AM      Result Value Ref Range   WBC 3.6 (*) 4.0 - 10.5 K/uL   RBC 3.72 (*) 3.87 - 5.11 MIL/uL   Hemoglobin 11.8 (*) 12.0 - 15.0 g/dL   HCT 37.2  36.0 - 46.0 %   MCV 100.0  78.0 - 100.0 fL   MCH 31.7  26.0 - 34.0 pg   MCHC 31.7  30.0 - 36.0 g/dL   RDW 15.9 (*) 11.5 - 15.5 %   Platelets 144 (*) 150 - 400 K/uL  BASIC METABOLIC PANEL     Status: Abnormal   Collection Time    07/05/14  5:30 AM      Result Value Ref Range   Sodium 142  137 - 147 mEq/L   Potassium 4.6  3.7 - 5.3 mEq/L   Chloride 102  96 - 112 mEq/L   CO2 27  19 -  32 mEq/L   Glucose, Bld 91  70 - 99 mg/dL   BUN 11  6 - 23 mg/dL   Creatinine, Ser 0.47 (*) 0.50 - 1.10 mg/dL   Calcium 8.7  8.4 - 10.5 mg/dL   GFR calc non Af Amer 84 (*) >90 mL/min   GFR calc Af Amer >90  >90 mL/min   Comment: (NOTE)     The eGFR has been calculated using the CKD EPI equation.     This calculation has not been validated in all clinical situations.     eGFR's persistently <90 mL/min signify possible Chronic Kidney     Disease.   Anion gap 13  5 - 15      Constitutional:  80 year old frail white female. Head forward posture and leaning to the right. Eyes:  Pupils reactive to light  Neck: Normal range of motion. Neck supple. No thyromegaly present.  Cardiovascular:  Irreg Irreg. No murmur  Respiratory: Effort normal and breath sounds normal. No respiratory distress.  GI: Soft. Bowel sounds are normal. She exhibits no distension. Non-tender Neurological:  Patient is fairly alert. Left gaze preference persists. She was expressively and receptively aphasic with apraxia. Attempts to converse. Seems to have some comprehension. Made eye contact when cued.RUE: 2-/5 deltoid, bicep, tricep, 3-/5 finger flex.  RLE: 3- HF, KE, and 2- ADF/PF but inconsistent.   R biceps and wirst  MAS 1-2  Skin: Skin is warm and dry  Psych: flat, without agitation Ext- neg edema  Assessment/Plan: 1. Functional deficits secondary to  left basal ganglia parenchymal hemorrhage consistent with hemorrhagic transformation of acute infarct which require 3+ hours per day of interdisciplinary therapy in a comprehensive inpatient rehab setting. Physiatrist is providing close team supervision and 24 hour management of active medical problems listed below. Physiatrist and rehab team continue to assess barriers to discharge/monitor patient progress toward functional and medical goals.  SNF placement pending  FIM: FIM - Bathing Bathing Steps Patient Completed: Chest;Right Arm;Abdomen;Buttocks;Right upper  leg;Left upper leg Bathing: 3: Mod-Patient completes 5-7 [id number] parts or 50-74%  FIM - Upper Body Dressing/Undressing Upper body dressing/undressing steps patient completed: Thread/unthread left sleeve of pullover shirt/dress;Put head through opening of pull over shirt/dress;Thread/unthread right sleeve of pullover shirt/dresss Upper body dressing/undressing: 4: Min-Patient completed 75 plus % of tasks FIM - Lower Body Dressing/Undressing Lower body dressing/undressing steps patient completed: Thread/unthread left pants leg Lower body dressing/undressing: 1: Total-Patient completed less than 25% of tasks  FIM - Toileting Toileting steps completed by patient: Adjust clothing prior to toileting Toileting Assistive Devices: Grab bar or rail for support Toileting: 2: Max-Patient completed 1 of 3 steps  FIM - TRadio producerDevices: Grab bars Toilet Transfers: 4-To toilet/BSC: Min A (steadying Pt. > 75%);4-From toilet/BSC: Min A (steadying Pt. > 75%)  FIM - Bed/Chair Transfer Bed/Chair Transfer Assistive Devices: Arm rests Bed/Chair Transfer: 4: Supine > Sit: Min A (steadying Pt. > 75%/lift 1 leg);4: Bed > Chair or W/C: Min A (steadying Pt. > 75%)  FIM - Locomotion: Wheelchair Locomotion: Wheelchair: 1: Total Assistance/staff pushes wheelchair (Pt<25%) FIM - Locomotion: Ambulation Locomotion: Ambulation Assistive Devices: WAdministratorAmbulation/Gait Assistance: 4: Min assist Locomotion: Ambulation: 1: Travels less than 50 ft with minimal assistance (Pt.>75%)  Comprehension Comprehension Mode: Auditory Comprehension: 2-Understands basic 25 - 49% of the time/requires cueing 51 - 75% of the time  Expression Expression Mode: Verbal Expression: 1-Expresses basis less than 25% of the time/requires cueing greater than 75% of the time.  Social Interaction Social Interaction: 2-Interacts appropriately 25 - 49% of time - Needs frequent redirection.  Problem  Solving Problem Solving: 2-Solves basic 25 - 49% of the time - needs direction more than half the time to initiate, plan or complete simple activities  Memory Memory: 1-Recognizes or recalls less than 25% of the time/requires cueing greater than 75% of the time  Medical Problem List and Plan:  1. 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 No aspirin at this time repeat CT scan in one week to decide upon initiation of aspirin  2. DVT Prophylaxis/Anticoagulation: SCDs. Monitor for any signs of DVT  3. Pain Management: Tylenol as needed  4. Mood/anxiety. Xanax 0.25 mg 3 times a day as needed. Provide emotional support:  5. Neuropsych: This patient is not capable of making decisions on her own behalf.   6. Dysphagia. Dysphagia 1 with honey thick liquids.  -intake remains adequate  -all follow up labwork looking good today 7. History of atrial fibrillation/pacemaker with chronic Coumadin. Coumadin held secondary to hemorrhagic transformation x 3-4 wk.  To f/u with Neuro post D/C to determine restart date on warfarin Cardiac rate controlled. Continue Toprol 50 mg twice a day for rate control  8. Recent pelvic fracture. Weightbearing as tolerated. No pain with  LE ROM 9. Hypothyroidism. Synthroid 10.  urinary incont, UA neg, trialing anticholinergic which seems to be helping  LOS (Days) 16 A FACE TO FACE EVALUATION WAS PERFORMED  Raedyn Wenke T 07/05/2014, 8:48 AM

## 2014-07-05 NOTE — Progress Notes (Signed)
Physical Therapy Session Note  Patient Details  Name: Christy Miranda MRN: [id number] Date of Birth: 14-Sep-1921  Today's Date: 07/05/2014 Time: 8416-6063 and 1100-1130  Time Calculation (min): 38 min and 30 min  Short Term Goals: Week 2:  PT Short Term Goal 1 (Week 2): = LTGs of overall min A  Skilled Therapeutic Interventions/Progress Updates:   Session 1: Pt received sitting in w/c, refusing therapy but agreeable to toileting. Pt performed stand pivot transfer with grab bar and min A w/c <> toilet and max A for clothing management, total A for hygiene. Pt washed hands at w/c level. Therapist attempted to take patient to therapy gym to engage in therapeutic activity, patient became tearful and stated, "No one is listening." Pt returned to room, unable to engage patient in therapeutic activity for task of folding clothes, etc. Stand pivot transfer w/c > bed with min A, sit > supine with supervision, and repositioned higher in bed with therapist stabilizing RLE. Pt left semi reclined in bed with all needs within reach.   Session 2: Pt received semi reclined in bed, agreeable to therapy with education provided that today is last day of physical therapy in preparation for d/c to SNF for short term rehab tomorrow before returning home. Pt transferred supine > sit with min A and ambulated from EOB to bathroom x 10-15 ft using RW and min A. Pt unable to void. Gait training from room throughout rehab unit on hard level and carpeted surfaces using RW x 130 ft, min A overall. Pt returned to room and left semi reclined in bed with all needs within reach.  Therapy Documentation Precautions:  Precautions Precautions: Fall Precaution Comments: Rt sided hemiparesis, aphasia, severe kyphosis/scoliosis, recent pelvic fx, right neglect Restrictions Weight Bearing Restrictions: No LLE Weight Bearing: Weight bearing as tolerated Pain: Pain Assessment Pain Assessment: Faces Faces Pain Scale: No hurt  See FIM  for current functional status  Therapy/Group: Individual Therapy  Laretta Alstrom 07/05/2014, 10:48 AM

## 2014-07-05 NOTE — Progress Notes (Signed)
Speech Language Pathology Discharge Summary  Patient Details  Name: Christy Miranda MRN: [id number] Date of Birth: [birth year]  Today's Date: 07/05/2014 Time: 8102-5486 Time Calculation (min): 52 min  Skilled Therapeutic Interventions:  Pt was seen for skilled speech therapy targeting cognitive-linguistic and dysphagia goals.  Upon arrival, pt initially attempted to refuse SLP, but was agreeable to participate in bedside therapies.  SLP facilitated session with structured receptive naming practice with pt able to identify an object named from a field of two by pointing for ~25% accuracy with max assist.  Additionally, pt was ~25% accurate for confrontational naming of basic objects with written aids.  SLP engaged pt in structured apraxia drills with pt able to produce vowel repetitions for~50% accuracy with max assist, and alternating vowels with max-total assist and written aids.  SLP completed skilled observations with presentations of honey thick liquids via teaspoon with pt exhibiting no overt s/s of aspiration with min assist cues for use of swallowing precautions.      Patient has met 6 of 7 long term goals.  Patient to discharge at overall Mod;Max level.  Reasons goals not met: inconsistent progress   Clinical Impression/Discharge Summary:  Pt made functional gains while inpatient and met 6 out of 7 long term goals secondary to improved functional communication via multimodal means.  Pt is to discharge at an overall max level of assistance for cognitive-linguistic tasks secondary to global aphasia, decreased recall of daily information, and decreased sustained attention.  Additionally, pt utilizes recommended swallowing precautions to minimize overt s/s of aspiration with dys 1 textures and honey thick liquids via teaspoon.  Pt and family would benefit from ongoing education at next level of care.  Pt is to discharge to SNF where she would benefit from continued ST services in order to maximize  functional independence and reduce burden of care upon discharge.    Care Partner:  Caregiver Able to Provide Assistance: Other (comment) (SNF)     Recommendation:  24 hour supervision/assistance;Home Health SLP;Outpatient SLP;Skilled Nursing facility  Rationale for SLP Follow Up: Maximize functional communication;Maximize swallowing safety;Maximize cognitive function and independence;Reduce caregiver burden   Equipment: none recommended by SLP   Reasons for discharge: Discharged from hospital   Patient/Family Agrees with Progress Made and Goals Achieved: Yes   See FIM for current functional status  Windell Moulding, M.A. CCC-SLP  Cevin Rubinstein, Selinda Orion 07/05/2014, 5:31 PM

## 2014-07-05 NOTE — Plan of Care (Signed)
Problem: RH Expression Communication Goal: LTG Patient will verbally express basic/complex needs (SLP) LTG: Patient will verbally express basic/complex needs, wants or ideas with cues (SLP)  Outcome: Not Met (add Reason) Pt inconsistent in verbalizations to make needs/wants known secondary to apraxia in addition to aphasia

## 2014-07-05 NOTE — Progress Notes (Signed)
Peripherally Inserted Central Catheter/Midline Placement  The IV Nurse has discussed with the patient and/or persons authorized to consent for the patient, the purpose of this procedure and the potential benefits and risks involved with this procedure.  The benefits include less needle sticks, lab draws from the catheter and patient may be discharged home with the catheter.  Risks include, but not limited to, infection, bleeding, blood clot (thrombus formation), and puncture of an artery; nerve damage and irregular heat beat.  Alternatives to this procedure were also discussed.  PICC/Midline Placement Documentation        Henderson Baltimore 07/05/2014, 12:14 PM Consent obtained by Claretha Cooper, RN

## 2014-07-05 NOTE — Discharge Summary (Signed)
Christy Miranda, Christy Miranda              ACCOUNT NO.:  [id number]  MEDICAL RECORD NO.:  [id number]  LOCATION:  [id number]                        FACILITY:  [facility]  PHYSICIAN:  Charlett Blake, M.D.DATE OF BIRTH:  1921/08/10  DATE OF ADMISSION:  06/19/2014 DATE OF DISCHARGE:  07/06/2014                              DISCHARGE SUMMARY   DISCHARGE DIAGNOSES: 1. Functional deficits secondary to left basal ganglia infarction with     hemorrhagic transformation. 2. Sequential compression devices for deep vein thrombosis     prophylaxis. 3. Anxiety. 4. Dysphagia. 5. History of atrial fibrillation pacemaker. 6. Recent pelvic fracture. 7. Hypothyroidism.  HISTORY OF PRESENT ILLNESS:  This is a 80 year old right-handed female with history of atrial fibrillation, pacemaker, chronic Coumadin therapy with recent fall, pelvic fracture, recently at a skilled nursing facility secondary to pelvic fractures.  Admitted June 13, 2014, with right-sided weakness as well as aphasia.  INR on admission of 1.35. Initial cranial CT scan negative for acute changes.  The patient did receive tPA.  Followup cranial CT scan showed a 3.6 cm left basal ganglia parenchymal hemorrhage consistent with hemorrhagic transformation of acute infarct 5 mm right midline shift. Echocardiogram with ejection fraction of 55%.  No wall motion abnormalities.  Carotid Dopplers with no ICA stenosis.  Neurology Services consulted.  Aspirin held for tPA hemorrhage.  Repeat cranial CT scan June 19, 2014, showed slight interval decrease in size of hematoma. Plan was to repeat a scan and begin aspirin therapy as stable.  Modified barium swallow on June 15, 2014, maintained on a dysphagia 1 honey thick liquid diet.  Physical and occupational therapy ongoing.  The patient was admitted for a comprehensive rehab program.  PAST MEDICAL HISTORY:  See discharge diagnoses.  SOCIAL HISTORY:  The patient recently at a skilled nursing  facility.  FUNCTIONAL HISTORY:  Prior to admission, needing assistance for ambulation transfers.  FUNCTIONAL STATUS:  Upon admission to rehab services was +2 physical assist sit to stand, +2 physical assist stand pivot transfers, mod to max assist activities of daily living.  PHYSICAL EXAMINATION:  VITAL SIGNS:  Blood pressure 134/56, pulse 89, temperature 99, respirations 18. GENERAL:  This was an alert, frail female head forward posture and leaning to the right.  HEENT:  Pupils reactive to light.  She was alert with a left gaze preference, expressive and receptive aphasia. LUNGS:  Decreased breath sounds.  Clear to auscultation. CARDIAC:  Rate controlled. ABDOMEN:  Soft, nontender.  Good bowel sounds.  REHABILITATION HOSPITAL COURSE:  The patient was admitted to Inpatient Rehab Services with therapies initiated on a 3-hour daily basis consisting of physical therapy, occupational therapy, speech therapy, and 24-hour rehabilitation nursing.  The following issues were addressed during the patient's rehabilitation stay.  Pertaining to Ms. Inscore's left basal ganglion infarct hemorrhagic transformation, plan was to repeat cranial CT scan, begin aspirin therapy if stable which was completed 07/05/2014 showing no new blood products. Scan was reviewed by neurology services advised to begin aspirin 81 mg daily..  Sequential compression devices for DVT prophylaxis.  She did have a history of anxiety with Xanax as needed, emotional support.  Followed intensively by Speech Therapy for her dysphagia.  Maintained on  a dysphagia 1 honey thick liquid diet with close monitoring of hydration.  A midline catheter was placed July 05, 2014, to enable the patient to receive the appropriate hydration and monitor.  She did have a history of atrial fibrillation, pacemaker, no Coumadin therapy due to hemorrhagic transformation.  This could be resumed postoperatively.  This could be resumed after follow up  with Neurology Services to determine possible need to resume Coumadin.  Question appropriate candidate for blood thinner.  Recent pelvic fracture weightbearing as tolerated.  She continued on hormone supplement for hypothyroidism.  The patient received weekly collaborative interdisciplinary team conferences to discuss estimated length of stay, family teaching, and any barriers to her discharge.  The patient with ongoing need assistance due to right- sided weakness, right gaze preference.  The patient performed stand pivot transfers with grab bar and minimal assist  wheelchair toilet, max assist for clothing management, total assist for hygiene.  She was able to communicate basic needs but limited due to her aphasia followed closely by Speech Therapy.  Noted limited physical assist at home, felt a skilled nursing facility was needed with bed becoming available on July 06, 2014.  DISCHARGE MEDICATIONS: 1. Xanax 0.25 mg p.o. t.i.d. as needed. 2. Synthroid 50 mcg p.o. daily. 3. Lopressor 50 mg p.o. b.i.d. 4. Ditropan 5 mg p.o. at bedtime. 5. Protonix 40 mg p.o. daily. 6.Aspirin 81 mg daily DIET:  Dysphagia 1 honey thick liquids.  She was on half-normal saline 75 an hour from 7:00 p.m. to 7:00 a.m. daily.  SPECIAL INSTRUCTIONS:  A followup cranial CT scan was to be completed to consider beginning aspirin therapy after hemorrhagic transformation.  No Coumadin at this time due to hemorrhagic transformation. Close monitoring of hydration while on IV fluids.     Lauraine Rinne, P.A.   ______________________________ Charlett Blake, M.D.    DA/MEDQ  D:  07/05/2014  T:  07/05/2014  Job:  [id number]  cc:   [name], MD

## 2014-07-06 DIAGNOSIS — G8190 Hemiplegia, unspecified affecting unspecified side: Secondary | ICD-10-CM | POA: Diagnosis not present

## 2014-07-06 DIAGNOSIS — E0789 Other specified disorders of thyroid: Secondary | ICD-10-CM | POA: Diagnosis not present

## 2014-07-06 DIAGNOSIS — R479 Unspecified speech disturbances: Secondary | ICD-10-CM | POA: Diagnosis not present

## 2014-07-06 DIAGNOSIS — I70209 Unspecified atherosclerosis of native arteries of extremities, unspecified extremity: Secondary | ICD-10-CM | POA: Diagnosis not present

## 2014-07-06 DIAGNOSIS — I69959 Hemiplegia and hemiparesis following unspecified cerebrovascular disease affecting unspecified side: Secondary | ICD-10-CM | POA: Diagnosis not present

## 2014-07-06 DIAGNOSIS — F418 Other specified anxiety disorders: Secondary | ICD-10-CM | POA: Diagnosis not present

## 2014-07-06 DIAGNOSIS — R4701 Aphasia: Secondary | ICD-10-CM | POA: Diagnosis not present

## 2014-07-06 DIAGNOSIS — R1310 Dysphagia, unspecified: Secondary | ICD-10-CM | POA: Diagnosis not present

## 2014-07-06 DIAGNOSIS — I10 Essential (primary) hypertension: Secondary | ICD-10-CM | POA: Diagnosis not present

## 2014-07-06 DIAGNOSIS — I69991 Dysphagia following unspecified cerebrovascular disease: Secondary | ICD-10-CM | POA: Diagnosis not present

## 2014-07-06 DIAGNOSIS — E039 Hypothyroidism, unspecified: Secondary | ICD-10-CM | POA: Diagnosis not present

## 2014-07-06 DIAGNOSIS — M6281 Muscle weakness (generalized): Secondary | ICD-10-CM | POA: Diagnosis not present

## 2014-07-06 DIAGNOSIS — S32509D Unspecified fracture of unspecified pubis, subsequent encounter for fracture with routine healing: Secondary | ICD-10-CM | POA: Diagnosis not present

## 2014-07-06 DIAGNOSIS — I6322 Cerebral infarction due to unspecified occlusion or stenosis of basilar arteries: Secondary | ICD-10-CM | POA: Diagnosis not present

## 2014-07-06 DIAGNOSIS — I6350 Cerebral infarction due to unspecified occlusion or stenosis of unspecified cerebral artery: Secondary | ICD-10-CM | POA: Diagnosis not present

## 2014-07-06 DIAGNOSIS — R4789 Other speech disturbances: Secondary | ICD-10-CM | POA: Diagnosis not present

## 2014-07-06 DIAGNOSIS — I739 Peripheral vascular disease, unspecified: Secondary | ICD-10-CM | POA: Diagnosis not present

## 2014-07-06 DIAGNOSIS — Z7901 Long term (current) use of anticoagulants: Secondary | ICD-10-CM | POA: Diagnosis not present

## 2014-07-06 DIAGNOSIS — F329 Major depressive disorder, single episode, unspecified: Secondary | ICD-10-CM | POA: Diagnosis not present

## 2014-07-06 DIAGNOSIS — S32509A Unspecified fracture of unspecified pubis, initial encounter for closed fracture: Secondary | ICD-10-CM | POA: Diagnosis not present

## 2014-07-06 DIAGNOSIS — M129 Arthropathy, unspecified: Secondary | ICD-10-CM | POA: Diagnosis not present

## 2014-07-06 DIAGNOSIS — M1610 Unilateral primary osteoarthritis, unspecified hip: Secondary | ICD-10-CM | POA: Diagnosis not present

## 2014-07-06 DIAGNOSIS — M4120 Other idiopathic scoliosis, site unspecified: Secondary | ICD-10-CM | POA: Diagnosis not present

## 2014-07-06 DIAGNOSIS — H109 Unspecified conjunctivitis: Secondary | ICD-10-CM | POA: Diagnosis not present

## 2014-07-06 DIAGNOSIS — F341 Dysthymic disorder: Secondary | ICD-10-CM | POA: Diagnosis not present

## 2014-07-06 DIAGNOSIS — L22 Diaper dermatitis: Secondary | ICD-10-CM | POA: Diagnosis not present

## 2014-07-06 DIAGNOSIS — K5732 Diverticulitis of large intestine without perforation or abscess without bleeding: Secondary | ICD-10-CM | POA: Diagnosis not present

## 2014-07-06 DIAGNOSIS — C384 Malignant neoplasm of pleura: Secondary | ICD-10-CM | POA: Diagnosis not present

## 2014-07-06 DIAGNOSIS — M169 Osteoarthritis of hip, unspecified: Secondary | ICD-10-CM | POA: Diagnosis not present

## 2014-07-06 DIAGNOSIS — M79609 Pain in unspecified limb: Secondary | ICD-10-CM | POA: Diagnosis not present

## 2014-07-06 DIAGNOSIS — R488 Other symbolic dysfunctions: Secondary | ICD-10-CM | POA: Diagnosis not present

## 2014-07-06 DIAGNOSIS — F411 Generalized anxiety disorder: Secondary | ICD-10-CM | POA: Diagnosis not present

## 2014-07-06 DIAGNOSIS — I4891 Unspecified atrial fibrillation: Secondary | ICD-10-CM | POA: Diagnosis not present

## 2014-07-06 DIAGNOSIS — Z5189 Encounter for other specified aftercare: Secondary | ICD-10-CM | POA: Diagnosis not present

## 2014-07-06 DIAGNOSIS — I6330 Cerebral infarction due to thrombosis of unspecified cerebral artery: Secondary | ICD-10-CM | POA: Diagnosis not present

## 2014-07-06 DIAGNOSIS — R1312 Dysphagia, oropharyngeal phase: Secondary | ICD-10-CM | POA: Diagnosis not present

## 2014-07-06 DIAGNOSIS — I6990 Unspecified sequelae of unspecified cerebrovascular disease: Secondary | ICD-10-CM | POA: Diagnosis not present

## 2014-07-06 DIAGNOSIS — F3289 Other specified depressive episodes: Secondary | ICD-10-CM | POA: Diagnosis not present

## 2014-07-06 DIAGNOSIS — R21 Rash and other nonspecific skin eruption: Secondary | ICD-10-CM | POA: Diagnosis not present

## 2014-07-06 DIAGNOSIS — I69920 Aphasia following unspecified cerebrovascular disease: Secondary | ICD-10-CM | POA: Diagnosis not present

## 2014-07-06 NOTE — Progress Notes (Signed)
Patient discharged to SNF via EMS, escorted by EMS staff.  All patient belongings taken with patient.  Report previously given to nurse at facility.  Appears to be in no immediate distress at this time.  VSS.  Brita Romp, RN

## 2014-07-06 NOTE — Progress Notes (Signed)
Social Work Discharge Note  The overall goal for the admission was met for:   Discharge location: No - plan changed from home to SNF  Length of Stay: No  Discharge activity level: No - pt at mod-max assist  Home/community participation: No - pt's plan changed to SNF  Services provided included: MD, RD, PT, OT, SLP, RN, Pharmacy and SW  Financial Services: Medicare and Private Insurance: [employer]  Follow-up services arranged: Other: Pt transferred to [facility]  Comments (or additional information):  Patient/Family verbalized understanding of follow-up arrangements: Yes  Individual responsible for coordination of the follow-up plan: pt's dtr  Confirmed correct DME delivered: Purva Vessell, [name] 07/06/2014    Verniece Encarnacion, [name]

## 2014-07-06 NOTE — Progress Notes (Signed)
Report called to [place] from [facility].  All questions answered.  Awaiting transport to facility.  Brita Romp, RN

## 2014-07-06 NOTE — Progress Notes (Signed)
Social Work Patient ID: Christy Miranda, female   DOB: 01-11-1921, 80 y.o.   MRN: [id number]  Christy Mesi Rheanne Cortopassi, LCSW Social Worker Signed  Patient Care Conference Service date: 07/05/2014 1:30 PM  Inpatient RehabilitationTeam Conference and Plan of Care Update Date: 07/05/2014   Time: 10:30 AM     Patient Name: Christy Miranda       Medical Record Number: [id number]   Date of Birth: 1921/04/02 Sex: Female         Room/Bed: 4W17C/4W17C-01 Payor Info: Payor: MEDICARE / Plan: MEDICARE PART A AND B / Product Type: *No Product type* /   Admitting Diagnosis: L CVA   Admit Date/Time:  06/19/2014  6:51 PM Admission Comments: No comment available   Primary Diagnosis:  <principal problem not specified> Principal Problem: <principal problem not specified>    Patient Active Problem List     Diagnosis  Date Noted   .  CVA (cerebral infarction)  06/19/2014   .  Undernutrition  06/19/2014   .  Fracture of multiple pubic rami  05/22/2014   .  Encounter for therapeutic drug monitoring  01/02/2014   .  Dizziness and giddiness  07/22/2013   .  Thrombocytopenia  11/16/2012   .  Deep venous thrombosis of lower leg  11/13/2012   .  Subtherapeutic international normalized ratio (INR)  11/13/2012   .  Pancytopenia  11/13/2012   .  Edema  11/07/2012   .  Pacemaker-[employer]  08/19/2012   .  Current use of long term anticoagulation  08/08/2011   .  Atrial fibrillation  02/28/2011   .  Tachycardia-bradycardia syndrome  02/28/2011   .  Hypertension  02/28/2011     Expected Discharge Date: Expected Discharge Date: 07/06/14  Team Members Present: Physician leading conference: Dr. Alger Simons Social Worker Present: Alfonse Alpers, LCSW Nurse Present: Elliot Cousin, RN PT Present: Carney Living, PT OT Present: Benay Pillow, OT SLP Present: Windell Moulding, SLP PPS Coordinator present : Daiva Nakayama, RN, CRRN;Becky Alwyn Ren, PT        Current Status/Progress  Goal  Weekly Team Focus   Medical    slow progress. ?functional decline this week---mood component  improve activity tolerance,   nutrition, hydration, mood   Bowel/Bladder     Incontinent of bowel and bladder LBM8/01/2014  Manage bowel and bladder, timed toileting   Assess bowel and bladder movements.   Swallow/Nutrition/ Hydration     dys 1 textures, honey thick liquids via teaspoon, min assist   least restrictive PO with min assist   carryover of swallowing precautions    ADL's     Min - Mod A for functional transfers, Total A for LB self care, Min A for UB dressing, Min A for grooming at sink side  overall mod assist for LB selfcare and functional transfers except for shower transfers which are set at max assist level currently.  ADL retraining, initiation, neuro re - ed, pt/family edu, functional transfer/mobility, and problem solving   Mobility     min A overall for stand pivot transfers, bed mobility, gait up to 30 ft with RW  min A transfers, bed mobility, ambulation up to 50 ft with LRAD  participation, transfers, gait, w/c positioning, activity tolerance   Communication     max-total assist for receptive and expressive functional communication   max assist   automatic sequences, responsive naming, recognition of object    Safety/Cognition/ Behavioral Observations    max assist   max assist   awareness  of perseveration and timely cessation of tasks    Pain     pt c/o of pain, could not give a accurate information concering her pain level or where  pain is.  Pain <3  Assess pt for verbal and non verbal signs of pain   Skin     Scab to L ankle, bruising to R lower leg, left eye bruising   Keep skin free of infection and breakdown.  Asses skin q shift for break down and infection.    Rehab Goals Patient on target to meet rehab goals: Yes Rehab Goals Revised: None *See Care Plan and progress notes for long and short-term goals.    Barriers to Discharge:  profound functional and linguistic deficits      Possible  Resolutions to Barriers:    full time care      Discharge Planning/Teaching Needs:    Plan for pt has changed to SNF.  As long as pt is medically stable for d/c, she should transfer to SNF 07-06-14.   None - SNF tx    Team Discussion:    Pt to receive a midline today for IV fluids to continue at night.  Pt has attempted to decline PT at times, but they have encouraged her to keep working and she has been able to in large part.  Pt was incontinent of bowel today with OT and is at total assist for lower body dressing, with mod assist with other ADLs.  Speech progress has been slow, but pt can communicate a little more and has used the call bell on occasion.    Revisions to Treatment Plan:    None    Continued Need for Acute Rehabilitation Level of Care: The patient requires daily medical management by a physician with specialized training in physical medicine and rehabilitation for the following conditions: Daily direction of a multidisciplinary physical rehabilitation program to ensure safe treatment while eliciting the highest outcome that is of practical value to the patient.: Yes Daily medical management of patient stability for increased activity during participation in an intensive rehabilitation regime.: Yes Daily analysis of laboratory values and/or radiology reports with any subsequent need for medication adjustment of medical intervention for : Neurological problems;Cardiac problems  Christy Miranda, Christy Mesi 07/06/2014, 10:03 AM

## 2014-07-06 NOTE — Progress Notes (Signed)
80 y.o. right-handed female with history of atrial fibrillation status post pacemaker with chronic Coumadin therapy, recent fall with pelvic fractures and had recently been at skilled nursing facility secondary to pelvic fractures.. Admitted 06/13/2049 with right-sided weakness as well as aphasia. INR on admission of 1.35. Initial cranial CT scan negative for acute changes. Patient did receive TPA. Followup cranial CT scan showed no 3.6 cm left basal ganglia parenchymal hemorrhage consistent with hemorrhagic transformation of acute infarct. 5 mm rightward midline shift. Echocardiogram with ejection fraction of 55% no wall motion abnormalities. Carotid Dopplers with no ICA stenosis. Neurology services consulted with workup ongoing. Aspirin held for post TPA hemorrhage. Repeat cranial CT scan 06/19/2014 shows slight interval decrease in size of hematoma centered in the left putamen, measuring 30 x 28 mm  Subjective/Complaints:   Up in bed. No new issues. Therapy reports less engagement. Seems more depressed ROS unable secondary to aphasia Objective: Vital Signs: Blood pressure 144/57, pulse 71, temperature 98.6 F (37 C), temperature source Oral, resp. rate 16, height 5' (1.524 m), weight 50.984 kg (112 lb 6.4 oz), SpO2 10.00%. Ct Head Wo Contrast  07/05/2014   CLINICAL DATA:  Followup left cerebral hemorrhage.  EXAM: CT HEAD WITHOUT CONTRAST  TECHNIQUE: Contiguous axial images were obtained from the base of the skull through the vertex without intravenous contrast.  COMPARISON:  06/15/2014  FINDINGS: Further evolutionary changes within the blood products in the left basal ganglia region, now predominantly low-density. No new hemorrhage. No mass effect or midline shift. Moderate chronic microvascular changes throughout the deep white matter.  Paranasal sinuses are clear.  Orbital soft tissues unremarkable.  IMPRESSION: Further evolutionary changes within the left basal ganglia bleed, now low-density.  No new  bleed.  Chronic microvascular disease.   Electronically Signed   By: Rolm Baptise M.D.   On: 07/05/2014 14:52   Results for orders placed during the hospital encounter of 06/19/14 (from the past 72 hour(s))  CBC     Status: Abnormal   Collection Time    07/05/14  5:30 AM      Result Value Ref Range   WBC 3.6 (*) 4.0 - 10.5 K/uL   RBC 3.72 (*) 3.87 - 5.11 MIL/uL   Hemoglobin 11.8 (*) 12.0 - 15.0 g/dL   HCT 37.2  36.0 - 46.0 %   MCV 100.0  78.0 - 100.0 fL   MCH 31.7  26.0 - 34.0 pg   MCHC 31.7  30.0 - 36.0 g/dL   RDW 15.9 (*) 11.5 - 15.5 %   Platelets 144 (*) 150 - 400 K/uL  BASIC METABOLIC PANEL     Status: Abnormal   Collection Time    07/05/14  5:30 AM      Result Value Ref Range   Sodium 142  137 - 147 mEq/L   Potassium 4.6  3.7 - 5.3 mEq/L   Chloride 102  96 - 112 mEq/L   CO2 27  19 - 32 mEq/L   Glucose, Bld 91  70 - 99 mg/dL   BUN 11  6 - 23 mg/dL   Creatinine, Ser 0.47 (*) 0.50 - 1.10 mg/dL   Calcium 8.7  8.4 - 10.5 mg/dL   GFR calc non Af Amer 84 (*) >90 mL/min   GFR calc Af Amer >90  >90 mL/min   Comment: (NOTE)     The eGFR has been calculated using the CKD EPI equation.     This calculation has not been validated in all clinical situations.  eGFR's persistently <90 mL/min signify possible Chronic Kidney     Disease.   Anion gap 13  5 - 15  CLOSTRIDIUM DIFFICILE BY PCR     Status: None   Collection Time    07/05/14 12:46 PM      Result Value Ref Range   C difficile by pcr NEGATIVE  NEGATIVE      Constitutional:  79 year old frail white female. Head forward posture and leaning to the right. Eyes:  Pupils reactive to light  Neck: Normal range of motion. Neck supple. No thyromegaly present.  Cardiovascular:  Irreg Irreg. No murmur  Respiratory: Effort normal and breath sounds normal. No respiratory distress.  GI: Soft. Bowel sounds are normal. She exhibits no distension. Non-tender Neurological:  Patient is fairly alert. Left gaze preference persists. She  was expressively and receptively aphasic with apraxia. Attempts to converse. Seems to have some comprehension. Made eye contact when cued.RUE: 2-/5 deltoid, bicep, tricep, 3-/5 finger flex. RLE: 3- HF, KE, and 2- ADF/PF but inconsistent.   R biceps and wirst  MAS 1-2  Skin: Skin is warm and dry  Psych: flat, without agitation Ext- neg edema  Assessment/Plan: 1. Functional deficits secondary to  left basal ganglia parenchymal hemorrhage consistent with hemorrhagic transformation of acute infarct which require 3+ hours per day of interdisciplinary therapy in a comprehensive inpatient rehab setting. Physiatrist is providing close team supervision and 24 hour management of active medical problems listed below. Physiatrist and rehab team continue to assess barriers to discharge/monitor patient progress toward functional and medical goals.  SNF placement pending  FIM: FIM - Bathing Bathing Steps Patient Completed: Chest;Right upper leg;Right Arm;Abdomen;Right lower leg (including foot);Left lower leg (including foot) Bathing: 3: Mod-Patient completes 5-7 67f10 parts or 50-74%  FIM - Upper Body Dressing/Undressing Upper body dressing/undressing steps patient completed: Thread/unthread left sleeve of pullover shirt/dress;Thread/unthread right sleeve of pullover shirt/dresss;Put head through opening of pull over shirt/dress Upper body dressing/undressing: 4: Min-Patient completed 75 plus % of tasks FIM - Lower Body Dressing/Undressing Lower body dressing/undressing steps patient completed: Thread/unthread left pants leg;Thread/unthread right pants leg Lower body dressing/undressing: 1: Total-Patient completed less than 25% of tasks  FIM - Toileting Toileting steps completed by patient: Adjust clothing prior to toileting Toileting Assistive Devices: Grab bar or rail for support Toileting: 2: Max-Patient completed 1 of 3 steps  FIM - TRadio producerDevices: Grab  bars Toilet Transfers: 4-To toilet/BSC: Min A (steadying Pt. > 75%);4-From toilet/BSC: Min A (steadying Pt. > 75%)  FIM - Bed/Chair Transfer Bed/Chair Transfer Assistive Devices: Arm rests Bed/Chair Transfer: 4: Sit > Supine: Min A (steadying pt. > 75%/lift 1 leg);4: Bed > Chair or W/C: Min A (steadying Pt. > 75%);4: Chair or W/C > Bed: Min A (steadying Pt. > 75%)  FIM - Locomotion: Wheelchair Locomotion: Wheelchair: 1: Total Assistance/staff pushes wheelchair (Pt<25%) FIM - Locomotion: Ambulation Locomotion: Ambulation Assistive Devices: WAdministratorAmbulation/Gait Assistance: 4: Min assist Locomotion: Ambulation: 2: Travels 50 - 149 ft with minimal assistance (Pt.>75%)  Comprehension Comprehension Mode: Auditory Comprehension: 3-Understands basic 50 - 74% of the time/requires cueing 25 - 50%  of the time  Expression Expression Mode: Verbal Expression: 2-Expresses basic 25 - 49% of the time/requires cueing 50 - 75% of the time. Uses single words/gestures.  Social Interaction Social Interaction: 3-Interacts appropriately 50 - 74% of the time - May be physically or verbally inappropriate.  Problem Solving Problem Solving: 2-Solves basic 25 - 49% of the  time - needs direction more than half the time to initiate, plan or complete simple activities  Memory Memory: 2-Recognizes or recalls 25 - 49% of the time/requires cueing 51 - 75% of the time  Medical Problem List and Plan:  1. 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 No aspirin at this time repeat CT scan in one week to decide upon initiation of aspirin  2. DVT Prophylaxis/Anticoagulation: SCDs. Monitor for any signs of DVT  3. Pain Management: Tylenol as needed  4. Mood/anxiety. Xanax 0.25 mg 3 times a day as needed.   -will add low dose celexa for mood  5. Neuropsych: This patient is not capable of making decisions on her own behalf.   6. Dysphagia. Dysphagia 1 with honey  thick liquids.  -intake remains adequate--will need ongoing supervision and encouragement however  -all follow up labwork ok 7. History of atrial fibrillation/pacemaker with chronic Coumadin. Coumadin held secondary to hemorrhagic transformation x 3-4 wk.  To f/u with Neuro post D/C to determine restart date on warfarin Cardiac rate controlled. Continue Toprol 50 mg twice a day for rate control  8. Recent pelvic fracture. Weightbearing as tolerated. No pain with LE ROM 9. Hypothyroidism. Synthroid 10.  urinary incont, UA neg, trialing anticholinergic which seems to be helping  LOS (Days) 17 A FACE TO FACE EVALUATION WAS PERFORMED  Alecea Trego T 07/06/2014, 10:06 AM

## 2014-07-23 DIAGNOSIS — I6990 Unspecified sequelae of unspecified cerebrovascular disease: Secondary | ICD-10-CM | POA: Diagnosis not present

## 2014-07-27 DIAGNOSIS — M79609 Pain in unspecified limb: Secondary | ICD-10-CM | POA: Diagnosis not present

## 2014-07-28 ENCOUNTER — Ambulatory Visit: Payer: Medicare Other | Admitting: Cardiology

## 2014-07-31 ENCOUNTER — Telehealth: Payer: Self-pay

## 2014-07-31 NOTE — Telephone Encounter (Signed)
Spoke to patient's daughter Bethena Roys she stated her mother is at [employer] recovering from a recent stroke.Stated she was not able to keep appointment with Dr.Jordan this past Friday 07/28/14.Stated stroke affected her rt side,speech slurred unable to understand what she is saying.Also difficulty swallowing.Daughter was told to let patient know we are thinking about her and hope she is better soon.Will let Dr.Jordan know.

## 2014-08-03 DIAGNOSIS — R21 Rash and other nonspecific skin eruption: Secondary | ICD-10-CM | POA: Diagnosis not present

## 2014-08-03 DIAGNOSIS — I6990 Unspecified sequelae of unspecified cerebrovascular disease: Secondary | ICD-10-CM | POA: Diagnosis not present

## 2014-08-04 DIAGNOSIS — M169 Osteoarthritis of hip, unspecified: Secondary | ICD-10-CM | POA: Diagnosis not present

## 2014-08-10 ENCOUNTER — Other Ambulatory Visit (HOSPITAL_COMMUNITY): Payer: Self-pay | Admitting: Geriatric Medicine

## 2014-08-10 DIAGNOSIS — R1310 Dysphagia, unspecified: Secondary | ICD-10-CM

## 2014-08-11 ENCOUNTER — Inpatient Hospital Stay: Payer: Medicare Other | Admitting: Physical Medicine & Rehabilitation

## 2014-08-15 ENCOUNTER — Encounter: Payer: Self-pay | Admitting: Cardiology

## 2014-08-16 ENCOUNTER — Ambulatory Visit (HOSPITAL_COMMUNITY)
Admission: RE | Admit: 2014-08-16 | Discharge: 2014-08-16 | Disposition: A | Discharge status: Home / Self Care | Payer: No Typology Code available for payment source | Source: Ambulatory Visit | Attending: Geriatric Medicine | Admitting: Geriatric Medicine

## 2014-08-16 DIAGNOSIS — I10 Essential (primary) hypertension: Secondary | ICD-10-CM | POA: Diagnosis not present

## 2014-08-16 DIAGNOSIS — I4891 Unspecified atrial fibrillation: Secondary | ICD-10-CM | POA: Diagnosis not present

## 2014-08-16 DIAGNOSIS — F411 Generalized anxiety disorder: Secondary | ICD-10-CM | POA: Diagnosis not present

## 2014-08-16 DIAGNOSIS — R1310 Dysphagia, unspecified: Secondary | ICD-10-CM | POA: Diagnosis not present

## 2014-08-16 DIAGNOSIS — M4120 Other idiopathic scoliosis, site unspecified: Secondary | ICD-10-CM | POA: Insufficient documentation

## 2014-08-16 DIAGNOSIS — E039 Hypothyroidism, unspecified: Secondary | ICD-10-CM | POA: Insufficient documentation

## 2014-08-16 DIAGNOSIS — Z7901 Long term (current) use of anticoagulants: Secondary | ICD-10-CM | POA: Insufficient documentation

## 2014-08-16 DIAGNOSIS — R4701 Aphasia: Secondary | ICD-10-CM | POA: Diagnosis not present

## 2014-08-16 NOTE — Procedures (Signed)
Objective Swallowing Evaluation: Modified Barium Swallowing Study  Patient Details  Name: Christy Miranda MRN: [id number] Date of Birth: 1921-07-25  Today's Date: 08/16/2014 Time: 1310-1340 SLP Time Calculation (min): 30 min  Past Medical History:  Past Medical History  Diagnosis Date  . HTN (hypertension)   . Permanent atrial fibrillation   . Bradycardia     s/p PPM  . Hypothyroidism   . Anxiety   . Diverticulitis     history of  . History of hysterectomy   . Long-term (current) use of anticoagulants   . Scoliosis (and kyphoscoliosis), idiopathic   . DVT (deep vein thrombosis) in pregnancy     right peroneal  . Dizziness and giddiness 07/22/2013   Past Surgical History:  Past Surgical History  Procedure Laterality Date  . Pacemaker insertion  03/03/2006    most recent generator (MDT) change 10/30/06 by Dr Verlon Setting  . Cardiac catheterization  01/08/1999    normal LV function  . Cardioversion  10/30/2006    Successful elective DC cardioversion  . Cardioversion  12/29/2002    Successful DC cardioversion  . Cardioversion  12/03/1999    Successful DC cardioversion  . Cholecystectomy     HPI:  80 yo female referred for outpt MBS due to h/o dysphagia from left basal ganglia CVA 06/13/14.  Pt also with global aphasia, HTN, Afib.    Pt has undergone previous MBS studies with recommendation for dys1/honey thick liquids via spoon.       Assessment / Plan / Recommendation Clinical Impression  Dysphagia Diagnosis: Moderate oral phase dysphagia;Moderate pharyngeal phase dysphagia  Clinical impression: Moderate oropharyngeal dysphagia characterized by oral discoordination/weakness resulting in decreased oral bolus cohesion and premature spillage of barium into pharynx.   Oral residuals of liquids prematurely spill into pt's pharynx with delayed pharyngeal swallow response.    Pharyngeal swallow with delay in swallow reflex and weakness resulting in vallecular stasis and trace amounts of  penetration of thin liquids.   Unfortunately with pt's aphasia and motor planning difficulties, she was unable to cough/clear her throat or dry swallow on command.    Pt's daughter reports pt has a good appetite and has not had pulmonary infections since neuro event.  Using video and verbal feedback, educated daughter to findings of test.  Defer to referring SLP for final diet recommendation.  Pt may be appropriate for dietary advancement to dys3/nectar or even thin given lack of aspiration and only trace penetration.       Treatment Recommendation  Defer treatment plan to SLP at (Comment)    Diet Recommendation Dysphagia 3 (Mechanical Soft);Nectar-thick liquid (? consider thin and/or Copywriter, advertising Protocol)   Liquid Administration via: Cup Medication Administration: Whole meds with puree Supervision: Full supervision/cueing for compensatory strategies Compensations: Slow rate;Small sips/bites;Check for pocketing (allow time for delayed extra swallows on occasion, encourage pt to conduct dry swallows if pt can succeed) Postural Changes and/or Swallow Maneuvers: Seated upright 90 degrees (sit upright as much as able)    Other  Recommendations Recommended Consults: Consider esophageal assessment Oral Care Recommendations: Oral care BID   Follow Up Recommendations         General Date of Onset: 08/16/14 HPI: 80 yo female referred for outpt MBS due to h/o dysphagia from left basal ganglia CVA 06/13/14.  Pt also with global aphasia, HTN, Afib.    Pt has undergone previous MBS studies with recommendation for dys1/honey thick liquids via spoon.   Type of Study: Modified Barium Swallowing Study Reason for Referral:  Objectively evaluate swallowing function Previous Swallow Assessment: MBS moderate oropharyngeal dysphagia with aspiration of honey, nectar - silent  Diet Prior to this Study: Dysphagia 1 (puree);Honey-thick liquids Temperature Spikes Noted: No Respiratory Status: Room air History of  Recent Intubation: No Behavior/Cognition: Alert;Other (comment) (pt has global aphasia from cva) Oral Cavity - Dentition: Adequate natural dentition (upper denture) Oral Motor / Sensory Function: Impaired motor;Impaired sensory Oral impairment:  (pt with decreased labial closure on right, decreased lingual ROM) Self-Feeding Abilities: Able to feed self;Needs assist;Needs set up (pt held her own cup) Patient Positioning: Postural control interferes with function (pt severely kyphotic impacting clearance) Baseline Vocal Quality: Clear Volitional Cough: Cognitively unable to elicit (pt with motor planning deficits from cva) Volitional Swallow: Unable to elicit Anatomy:  (pt appears kyphotic) Pharyngeal Secretions: Not observed secondary MBS    Reason for Referral Objectively evaluate swallowing function   Oral Phase Oral Preparation/Oral Phase Oral Phase: Impaired Oral - Honey Oral - Honey Teaspoon: Reduced posterior propulsion;Weak lingual manipulation;Lingual/palatal residue Oral - Honey Cup: Reduced posterior propulsion;Weak lingual manipulation;Lingual/palatal residue Oral - Nectar Oral - Nectar Teaspoon: Reduced posterior propulsion;Weak lingual manipulation;Lingual/palatal residue Oral - Nectar Cup: Reduced posterior propulsion;Weak lingual manipulation;Lingual/palatal residue;Right anterior bolus loss Oral - Nectar Straw: Reduced posterior propulsion;Weak lingual manipulation;Lingual/palatal residue Oral - Thin Oral - Thin Teaspoon: Reduced posterior propulsion;Weak lingual manipulation;Lingual/palatal residue;Right anterior bolus loss Oral - Thin Cup: Reduced posterior propulsion;Weak lingual manipulation;Lingual/palatal residue;Right anterior bolus loss Oral - Thin Straw: Reduced posterior propulsion;Weak lingual manipulation;Lingual/palatal residue Oral - Solids Oral - Puree: Reduced posterior propulsion;Weak lingual manipulation;Delayed oral transit Oral - Mechanical Soft:  Reduced posterior propulsion;Impaired mastication;Weak lingual manipulation;Delayed oral transit   Pharyngeal Phase Pharyngeal Phase Pharyngeal Phase: Impaired Pharyngeal - Honey Pharyngeal - Honey Teaspoon: Premature spillage to valleculae;Pharyngeal residue - valleculae Pharyngeal - Honey Cup: Premature spillage to valleculae;Pharyngeal residue - valleculae Pharyngeal - Nectar Pharyngeal - Nectar Teaspoon: Premature spillage to valleculae;Pharyngeal residue - valleculae Pharyngeal - Nectar Cup: Premature spillage to valleculae;Pharyngeal residue - valleculae Pharyngeal - Nectar Straw: Premature spillage to valleculae;Pharyngeal residue - valleculae Pharyngeal - Thin Pharyngeal - Thin Teaspoon: Premature spillage to valleculae;Penetration/Aspiration during swallow;Pharyngeal residue - valleculae Pharyngeal - Thin Cup: Premature spillage to valleculae;Penetration/Aspiration during swallow;Pharyngeal residue - valleculae Pharyngeal - Thin Straw: Premature spillage to valleculae;Penetration/Aspiration during swallow Pharyngeal - Solids Pharyngeal - Puree: Premature spillage to valleculae Pharyngeal - Mechanical Soft: Premature spillage to valleculae;Pharyngeal residue - valleculae Pharyngeal Phase - Comment Pharyngeal Comment: premature spillage of oral residuals noted with very delayed pharyngeal swallow reflex *barium reaching near level of proximal epiglottis  Cervical Esophageal Phase    GO    Cervical Esophageal Phase Cervical Esophageal Phase: Impaired Cervical Esophageal Phase - Comment Cervical Esophageal Comment: Upon esophageal sweep at end of study, pt appeared with barium stasis distal to proximal region.  Thin barium via straw did not faciliate clearance and only appeared to add to stasis.  Pt is kyphotic and suspect this contributes significantly to compromised clearance.  Pt may benefit from dedicated esophageal evaluation if referring SLP/MD indicates.      Functional  Assessment Tool Used: mbs, clinical judgement Functional Limitations: Swallowing Swallow Current Status (Z3086): At least 20 percent but less than 40 percent impaired, limited or restricted Swallow Goal Status [phone]): At least 20 percent but less than 40 percent impaired, limited or restricted Swallow Discharge Status [phone]): At least 20 percent but less than 40 percent impaired, limited or restricted    Luanna Salk, [facility] SLP [phone]

## 2014-08-17 DIAGNOSIS — I70209 Unspecified atherosclerosis of native arteries of extremities, unspecified extremity: Secondary | ICD-10-CM | POA: Diagnosis not present

## 2014-08-21 ENCOUNTER — Inpatient Hospital Stay: Payer: Medicare Other | Admitting: Physical Medicine & Rehabilitation

## 2014-08-21 ENCOUNTER — Encounter: Payer: Medicare Other | Attending: Physical Medicine & Rehabilitation

## 2014-08-21 DIAGNOSIS — M79609 Pain in unspecified limb: Secondary | ICD-10-CM | POA: Diagnosis not present

## 2014-08-22 ENCOUNTER — Other Ambulatory Visit (HOSPITAL_COMMUNITY): Payer: Self-pay | Admitting: Geriatric Medicine

## 2014-08-22 ENCOUNTER — Ambulatory Visit (HOSPITAL_COMMUNITY)
Admission: RE | Admit: 2014-08-22 | Discharge: 2014-08-22 | Disposition: A | Discharge status: Home / Self Care | Payer: No Typology Code available for payment source | Source: Ambulatory Visit | Attending: Geriatric Medicine | Admitting: Geriatric Medicine

## 2014-08-22 DIAGNOSIS — I739 Peripheral vascular disease, unspecified: Secondary | ICD-10-CM

## 2014-08-22 NOTE — Progress Notes (Signed)
VASCULAR LAB PRELIMINARY  ARTERIAL  ABI completed:    RIGHT    LEFT    PRESSURE WAVEFORM  PRESSURE WAVEFORM  BRACHIAL 122  triphasic BRACHIAL 126 triphasic  DP   DP    AT 143 monophasic AT 154 triphasic  PT 193 monophasic PT 203 biphasic  PER   PER    GREAT TOE  NA GREAT TOE  NA    RIGHT LEFT  ABI >1.0 >1.0     Abdurrahman Petersheim, RVT 08/22/2014, 1:40 PM

## 2014-08-23 DIAGNOSIS — F329 Major depressive disorder, single episode, unspecified: Secondary | ICD-10-CM | POA: Diagnosis not present

## 2014-08-23 DIAGNOSIS — F3289 Other specified depressive episodes: Secondary | ICD-10-CM | POA: Diagnosis not present

## 2014-08-31 DIAGNOSIS — E0789 Other specified disorders of thyroid: Secondary | ICD-10-CM | POA: Diagnosis not present

## 2014-08-31 DIAGNOSIS — R1312 Dysphagia, oropharyngeal phase: Secondary | ICD-10-CM | POA: Diagnosis not present

## 2014-08-31 DIAGNOSIS — E039 Hypothyroidism, unspecified: Secondary | ICD-10-CM | POA: Diagnosis not present

## 2014-08-31 DIAGNOSIS — F418 Other specified anxiety disorders: Secondary | ICD-10-CM | POA: Diagnosis not present

## 2014-08-31 DIAGNOSIS — I10 Essential (primary) hypertension: Secondary | ICD-10-CM | POA: Diagnosis not present

## 2014-08-31 DIAGNOSIS — R4789 Other speech disturbances: Secondary | ICD-10-CM | POA: Diagnosis not present

## 2014-08-31 DIAGNOSIS — I69991 Dysphagia following unspecified cerebrovascular disease: Secondary | ICD-10-CM | POA: Diagnosis not present

## 2014-08-31 DIAGNOSIS — M129 Arthropathy, unspecified: Secondary | ICD-10-CM | POA: Diagnosis not present

## 2014-08-31 DIAGNOSIS — I6322 Cerebral infarction due to unspecified occlusion or stenosis of basilar arteries: Secondary | ICD-10-CM | POA: Diagnosis not present

## 2014-08-31 DIAGNOSIS — I69920 Aphasia following unspecified cerebrovascular disease: Secondary | ICD-10-CM | POA: Diagnosis not present

## 2014-08-31 DIAGNOSIS — R488 Other symbolic dysfunctions: Secondary | ICD-10-CM | POA: Diagnosis not present

## 2014-08-31 DIAGNOSIS — F341 Dysthymic disorder: Secondary | ICD-10-CM | POA: Diagnosis not present

## 2014-08-31 DIAGNOSIS — I4891 Unspecified atrial fibrillation: Secondary | ICD-10-CM | POA: Diagnosis not present

## 2014-08-31 DIAGNOSIS — H109 Unspecified conjunctivitis: Secondary | ICD-10-CM | POA: Diagnosis not present

## 2014-08-31 DIAGNOSIS — R479 Unspecified speech disturbances: Secondary | ICD-10-CM | POA: Diagnosis not present

## 2014-08-31 DIAGNOSIS — R4701 Aphasia: Secondary | ICD-10-CM | POA: Diagnosis not present

## 2014-08-31 DIAGNOSIS — Z5189 Encounter for other specified aftercare: Secondary | ICD-10-CM | POA: Diagnosis not present

## 2014-08-31 DIAGNOSIS — S32509D Unspecified fracture of unspecified pubis, subsequent encounter for fracture with routine healing: Secondary | ICD-10-CM | POA: Diagnosis not present

## 2014-08-31 DIAGNOSIS — M6281 Muscle weakness (generalized): Secondary | ICD-10-CM | POA: Diagnosis not present

## 2014-09-15 ENCOUNTER — Telehealth: Payer: Self-pay | Admitting: Cardiology

## 2014-09-15 DIAGNOSIS — I10 Essential (primary) hypertension: Secondary | ICD-10-CM | POA: Diagnosis not present

## 2014-09-15 DIAGNOSIS — I69920 Aphasia following unspecified cerebrovascular disease: Secondary | ICD-10-CM | POA: Diagnosis not present

## 2014-09-15 DIAGNOSIS — Z9181 History of falling: Secondary | ICD-10-CM | POA: Diagnosis not present

## 2014-09-15 DIAGNOSIS — R1310 Dysphagia, unspecified: Secondary | ICD-10-CM | POA: Diagnosis not present

## 2014-09-15 DIAGNOSIS — S329XXD Fracture of unspecified parts of lumbosacral spine and pelvis, subsequent encounter for fracture with routine healing: Secondary | ICD-10-CM | POA: Diagnosis not present

## 2014-09-15 DIAGNOSIS — I4891 Unspecified atrial fibrillation: Secondary | ICD-10-CM | POA: Diagnosis not present

## 2014-09-15 DIAGNOSIS — I69951 Hemiplegia and hemiparesis following unspecified cerebrovascular disease affecting right dominant side: Secondary | ICD-10-CM | POA: Diagnosis not present

## 2014-09-15 DIAGNOSIS — F419 Anxiety disorder, unspecified: Secondary | ICD-10-CM | POA: Diagnosis not present

## 2014-09-15 DIAGNOSIS — I69991 Dysphagia following unspecified cerebrovascular disease: Secondary | ICD-10-CM | POA: Diagnosis not present

## 2014-09-15 NOTE — Telephone Encounter (Signed)
Christy Miranda called in stating that she would like for [name] to call her back in regards to her mother to discuss her being released from the rehab facility and her medications. Please call  Thanks

## 2014-09-15 NOTE — Telephone Encounter (Signed)
Returned call to patient's daughter Bethena Roys she stated mother discharged from rehab.Stated she moved back home yesterday 09/14/14.Stated she is unable to talk clearly and has rt sided weakness.Stated physical therapy is coming out and she was calling to schedule appointment with Dr.Jordan.Dr.Jordan's schedules are full until 12/2014.Stated she would like mother seen before then.Appointment scheduled with Kerin Ransom PA 10/02/14 at 4:00 pm.

## 2014-09-18 DIAGNOSIS — I69951 Hemiplegia and hemiparesis following unspecified cerebrovascular disease affecting right dominant side: Secondary | ICD-10-CM | POA: Diagnosis not present

## 2014-09-18 DIAGNOSIS — R1310 Dysphagia, unspecified: Secondary | ICD-10-CM | POA: Diagnosis not present

## 2014-09-18 DIAGNOSIS — I4891 Unspecified atrial fibrillation: Secondary | ICD-10-CM | POA: Diagnosis not present

## 2014-09-18 DIAGNOSIS — S329XXD Fracture of unspecified parts of lumbosacral spine and pelvis, subsequent encounter for fracture with routine healing: Secondary | ICD-10-CM | POA: Diagnosis not present

## 2014-09-18 DIAGNOSIS — I69920 Aphasia following unspecified cerebrovascular disease: Secondary | ICD-10-CM | POA: Diagnosis not present

## 2014-09-18 DIAGNOSIS — I69991 Dysphagia following unspecified cerebrovascular disease: Secondary | ICD-10-CM | POA: Diagnosis not present

## 2014-09-20 DIAGNOSIS — R1310 Dysphagia, unspecified: Secondary | ICD-10-CM | POA: Diagnosis not present

## 2014-09-20 DIAGNOSIS — I69920 Aphasia following unspecified cerebrovascular disease: Secondary | ICD-10-CM | POA: Diagnosis not present

## 2014-09-20 DIAGNOSIS — I69991 Dysphagia following unspecified cerebrovascular disease: Secondary | ICD-10-CM | POA: Diagnosis not present

## 2014-09-20 DIAGNOSIS — I69951 Hemiplegia and hemiparesis following unspecified cerebrovascular disease affecting right dominant side: Secondary | ICD-10-CM | POA: Diagnosis not present

## 2014-09-20 DIAGNOSIS — S329XXD Fracture of unspecified parts of lumbosacral spine and pelvis, subsequent encounter for fracture with routine healing: Secondary | ICD-10-CM | POA: Diagnosis not present

## 2014-09-20 DIAGNOSIS — I4891 Unspecified atrial fibrillation: Secondary | ICD-10-CM | POA: Diagnosis not present

## 2014-09-21 DIAGNOSIS — I10 Essential (primary) hypertension: Secondary | ICD-10-CM | POA: Diagnosis not present

## 2014-09-21 DIAGNOSIS — I639 Cerebral infarction, unspecified: Secondary | ICD-10-CM | POA: Diagnosis not present

## 2014-09-21 DIAGNOSIS — E039 Hypothyroidism, unspecified: Secondary | ICD-10-CM | POA: Diagnosis not present

## 2014-09-21 DIAGNOSIS — Z23 Encounter for immunization: Secondary | ICD-10-CM | POA: Diagnosis not present

## 2014-09-21 DIAGNOSIS — E559 Vitamin D deficiency, unspecified: Secondary | ICD-10-CM | POA: Diagnosis not present

## 2014-09-21 DIAGNOSIS — M899 Disorder of bone, unspecified: Secondary | ICD-10-CM | POA: Diagnosis not present

## 2014-09-21 DIAGNOSIS — Z79899 Other long term (current) drug therapy: Secondary | ICD-10-CM | POA: Diagnosis not present

## 2014-09-21 DIAGNOSIS — E780 Pure hypercholesterolemia: Secondary | ICD-10-CM | POA: Diagnosis not present

## 2014-09-22 DIAGNOSIS — I4891 Unspecified atrial fibrillation: Secondary | ICD-10-CM | POA: Diagnosis not present

## 2014-09-22 DIAGNOSIS — R1310 Dysphagia, unspecified: Secondary | ICD-10-CM | POA: Diagnosis not present

## 2014-09-22 DIAGNOSIS — I69951 Hemiplegia and hemiparesis following unspecified cerebrovascular disease affecting right dominant side: Secondary | ICD-10-CM | POA: Diagnosis not present

## 2014-09-22 DIAGNOSIS — S329XXD Fracture of unspecified parts of lumbosacral spine and pelvis, subsequent encounter for fracture with routine healing: Secondary | ICD-10-CM | POA: Diagnosis not present

## 2014-09-22 DIAGNOSIS — I69991 Dysphagia following unspecified cerebrovascular disease: Secondary | ICD-10-CM | POA: Diagnosis not present

## 2014-09-22 DIAGNOSIS — I69920 Aphasia following unspecified cerebrovascular disease: Secondary | ICD-10-CM | POA: Diagnosis not present

## 2014-09-25 DIAGNOSIS — I69920 Aphasia following unspecified cerebrovascular disease: Secondary | ICD-10-CM | POA: Diagnosis not present

## 2014-09-25 DIAGNOSIS — S329XXD Fracture of unspecified parts of lumbosacral spine and pelvis, subsequent encounter for fracture with routine healing: Secondary | ICD-10-CM | POA: Diagnosis not present

## 2014-09-25 DIAGNOSIS — I69951 Hemiplegia and hemiparesis following unspecified cerebrovascular disease affecting right dominant side: Secondary | ICD-10-CM | POA: Diagnosis not present

## 2014-09-25 DIAGNOSIS — R1310 Dysphagia, unspecified: Secondary | ICD-10-CM | POA: Diagnosis not present

## 2014-09-25 DIAGNOSIS — I69991 Dysphagia following unspecified cerebrovascular disease: Secondary | ICD-10-CM | POA: Diagnosis not present

## 2014-09-25 DIAGNOSIS — I4891 Unspecified atrial fibrillation: Secondary | ICD-10-CM | POA: Diagnosis not present

## 2014-09-26 DIAGNOSIS — R1310 Dysphagia, unspecified: Secondary | ICD-10-CM | POA: Diagnosis not present

## 2014-09-26 DIAGNOSIS — I69991 Dysphagia following unspecified cerebrovascular disease: Secondary | ICD-10-CM | POA: Diagnosis not present

## 2014-09-26 DIAGNOSIS — S329XXD Fracture of unspecified parts of lumbosacral spine and pelvis, subsequent encounter for fracture with routine healing: Secondary | ICD-10-CM | POA: Diagnosis not present

## 2014-09-26 DIAGNOSIS — I4891 Unspecified atrial fibrillation: Secondary | ICD-10-CM | POA: Diagnosis not present

## 2014-09-26 DIAGNOSIS — I69951 Hemiplegia and hemiparesis following unspecified cerebrovascular disease affecting right dominant side: Secondary | ICD-10-CM | POA: Diagnosis not present

## 2014-09-26 DIAGNOSIS — I69920 Aphasia following unspecified cerebrovascular disease: Secondary | ICD-10-CM | POA: Diagnosis not present

## 2014-09-27 DIAGNOSIS — R1310 Dysphagia, unspecified: Secondary | ICD-10-CM | POA: Diagnosis not present

## 2014-09-27 DIAGNOSIS — I69991 Dysphagia following unspecified cerebrovascular disease: Secondary | ICD-10-CM | POA: Diagnosis not present

## 2014-09-27 DIAGNOSIS — S329XXD Fracture of unspecified parts of lumbosacral spine and pelvis, subsequent encounter for fracture with routine healing: Secondary | ICD-10-CM | POA: Diagnosis not present

## 2014-09-27 DIAGNOSIS — I4891 Unspecified atrial fibrillation: Secondary | ICD-10-CM | POA: Diagnosis not present

## 2014-09-27 DIAGNOSIS — I69951 Hemiplegia and hemiparesis following unspecified cerebrovascular disease affecting right dominant side: Secondary | ICD-10-CM | POA: Diagnosis not present

## 2014-09-27 DIAGNOSIS — I69920 Aphasia following unspecified cerebrovascular disease: Secondary | ICD-10-CM | POA: Diagnosis not present

## 2014-09-29 DIAGNOSIS — I4891 Unspecified atrial fibrillation: Secondary | ICD-10-CM | POA: Diagnosis not present

## 2014-09-29 DIAGNOSIS — I69951 Hemiplegia and hemiparesis following unspecified cerebrovascular disease affecting right dominant side: Secondary | ICD-10-CM | POA: Diagnosis not present

## 2014-09-29 DIAGNOSIS — R1310 Dysphagia, unspecified: Secondary | ICD-10-CM | POA: Diagnosis not present

## 2014-09-29 DIAGNOSIS — I69991 Dysphagia following unspecified cerebrovascular disease: Secondary | ICD-10-CM | POA: Diagnosis not present

## 2014-09-29 DIAGNOSIS — S329XXD Fracture of unspecified parts of lumbosacral spine and pelvis, subsequent encounter for fracture with routine healing: Secondary | ICD-10-CM | POA: Diagnosis not present

## 2014-09-29 DIAGNOSIS — I69920 Aphasia following unspecified cerebrovascular disease: Secondary | ICD-10-CM | POA: Diagnosis not present

## 2014-10-02 ENCOUNTER — Ambulatory Visit: Payer: Medicare Other | Admitting: Cardiology

## 2014-10-02 DIAGNOSIS — I4891 Unspecified atrial fibrillation: Secondary | ICD-10-CM | POA: Diagnosis not present

## 2014-10-02 DIAGNOSIS — S329XXD Fracture of unspecified parts of lumbosacral spine and pelvis, subsequent encounter for fracture with routine healing: Secondary | ICD-10-CM | POA: Diagnosis not present

## 2014-10-02 DIAGNOSIS — I69991 Dysphagia following unspecified cerebrovascular disease: Secondary | ICD-10-CM | POA: Diagnosis not present

## 2014-10-02 DIAGNOSIS — R1310 Dysphagia, unspecified: Secondary | ICD-10-CM | POA: Diagnosis not present

## 2014-10-02 DIAGNOSIS — I69920 Aphasia following unspecified cerebrovascular disease: Secondary | ICD-10-CM | POA: Diagnosis not present

## 2014-10-02 DIAGNOSIS — I69951 Hemiplegia and hemiparesis following unspecified cerebrovascular disease affecting right dominant side: Secondary | ICD-10-CM | POA: Diagnosis not present

## 2014-10-04 DIAGNOSIS — S329XXD Fracture of unspecified parts of lumbosacral spine and pelvis, subsequent encounter for fracture with routine healing: Secondary | ICD-10-CM | POA: Diagnosis not present

## 2014-10-04 DIAGNOSIS — I4891 Unspecified atrial fibrillation: Secondary | ICD-10-CM | POA: Diagnosis not present

## 2014-10-04 DIAGNOSIS — I69991 Dysphagia following unspecified cerebrovascular disease: Secondary | ICD-10-CM | POA: Diagnosis not present

## 2014-10-04 DIAGNOSIS — I69951 Hemiplegia and hemiparesis following unspecified cerebrovascular disease affecting right dominant side: Secondary | ICD-10-CM | POA: Diagnosis not present

## 2014-10-04 DIAGNOSIS — R1310 Dysphagia, unspecified: Secondary | ICD-10-CM | POA: Diagnosis not present

## 2014-10-04 DIAGNOSIS — I69920 Aphasia following unspecified cerebrovascular disease: Secondary | ICD-10-CM | POA: Diagnosis not present

## 2014-10-05 DIAGNOSIS — I69920 Aphasia following unspecified cerebrovascular disease: Secondary | ICD-10-CM | POA: Diagnosis not present

## 2014-10-05 DIAGNOSIS — I69991 Dysphagia following unspecified cerebrovascular disease: Secondary | ICD-10-CM | POA: Diagnosis not present

## 2014-10-05 DIAGNOSIS — R1310 Dysphagia, unspecified: Secondary | ICD-10-CM | POA: Diagnosis not present

## 2014-10-05 DIAGNOSIS — S329XXD Fracture of unspecified parts of lumbosacral spine and pelvis, subsequent encounter for fracture with routine healing: Secondary | ICD-10-CM | POA: Diagnosis not present

## 2014-10-05 DIAGNOSIS — I4891 Unspecified atrial fibrillation: Secondary | ICD-10-CM | POA: Diagnosis not present

## 2014-10-05 DIAGNOSIS — I69951 Hemiplegia and hemiparesis following unspecified cerebrovascular disease affecting right dominant side: Secondary | ICD-10-CM | POA: Diagnosis not present

## 2014-10-06 DIAGNOSIS — I69951 Hemiplegia and hemiparesis following unspecified cerebrovascular disease affecting right dominant side: Secondary | ICD-10-CM | POA: Diagnosis not present

## 2014-10-06 DIAGNOSIS — I69991 Dysphagia following unspecified cerebrovascular disease: Secondary | ICD-10-CM | POA: Diagnosis not present

## 2014-10-06 DIAGNOSIS — I4891 Unspecified atrial fibrillation: Secondary | ICD-10-CM | POA: Diagnosis not present

## 2014-10-06 DIAGNOSIS — R1310 Dysphagia, unspecified: Secondary | ICD-10-CM | POA: Diagnosis not present

## 2014-10-06 DIAGNOSIS — S329XXD Fracture of unspecified parts of lumbosacral spine and pelvis, subsequent encounter for fracture with routine healing: Secondary | ICD-10-CM | POA: Diagnosis not present

## 2014-10-06 DIAGNOSIS — I69920 Aphasia following unspecified cerebrovascular disease: Secondary | ICD-10-CM | POA: Diagnosis not present

## 2014-10-09 DIAGNOSIS — S329XXD Fracture of unspecified parts of lumbosacral spine and pelvis, subsequent encounter for fracture with routine healing: Secondary | ICD-10-CM | POA: Diagnosis not present

## 2014-10-09 DIAGNOSIS — R1310 Dysphagia, unspecified: Secondary | ICD-10-CM | POA: Diagnosis not present

## 2014-10-09 DIAGNOSIS — I69920 Aphasia following unspecified cerebrovascular disease: Secondary | ICD-10-CM | POA: Diagnosis not present

## 2014-10-09 DIAGNOSIS — I4891 Unspecified atrial fibrillation: Secondary | ICD-10-CM | POA: Diagnosis not present

## 2014-10-09 DIAGNOSIS — I69991 Dysphagia following unspecified cerebrovascular disease: Secondary | ICD-10-CM | POA: Diagnosis not present

## 2014-10-09 DIAGNOSIS — I69951 Hemiplegia and hemiparesis following unspecified cerebrovascular disease affecting right dominant side: Secondary | ICD-10-CM | POA: Diagnosis not present

## 2014-10-10 DIAGNOSIS — I69920 Aphasia following unspecified cerebrovascular disease: Secondary | ICD-10-CM | POA: Diagnosis not present

## 2014-10-10 DIAGNOSIS — I69951 Hemiplegia and hemiparesis following unspecified cerebrovascular disease affecting right dominant side: Secondary | ICD-10-CM | POA: Diagnosis not present

## 2014-10-10 DIAGNOSIS — R1310 Dysphagia, unspecified: Secondary | ICD-10-CM | POA: Diagnosis not present

## 2014-10-10 DIAGNOSIS — I4891 Unspecified atrial fibrillation: Secondary | ICD-10-CM | POA: Diagnosis not present

## 2014-10-10 DIAGNOSIS — I69991 Dysphagia following unspecified cerebrovascular disease: Secondary | ICD-10-CM | POA: Diagnosis not present

## 2014-10-10 DIAGNOSIS — S329XXD Fracture of unspecified parts of lumbosacral spine and pelvis, subsequent encounter for fracture with routine healing: Secondary | ICD-10-CM | POA: Diagnosis not present

## 2014-10-11 DIAGNOSIS — I69920 Aphasia following unspecified cerebrovascular disease: Secondary | ICD-10-CM | POA: Diagnosis not present

## 2014-10-11 DIAGNOSIS — R1310 Dysphagia, unspecified: Secondary | ICD-10-CM | POA: Diagnosis not present

## 2014-10-11 DIAGNOSIS — I69951 Hemiplegia and hemiparesis following unspecified cerebrovascular disease affecting right dominant side: Secondary | ICD-10-CM | POA: Diagnosis not present

## 2014-10-11 DIAGNOSIS — I4891 Unspecified atrial fibrillation: Secondary | ICD-10-CM | POA: Diagnosis not present

## 2014-10-11 DIAGNOSIS — S329XXD Fracture of unspecified parts of lumbosacral spine and pelvis, subsequent encounter for fracture with routine healing: Secondary | ICD-10-CM | POA: Diagnosis not present

## 2014-10-11 DIAGNOSIS — I69991 Dysphagia following unspecified cerebrovascular disease: Secondary | ICD-10-CM | POA: Diagnosis not present

## 2014-10-12 DIAGNOSIS — I69991 Dysphagia following unspecified cerebrovascular disease: Secondary | ICD-10-CM | POA: Diagnosis not present

## 2014-10-12 DIAGNOSIS — I69951 Hemiplegia and hemiparesis following unspecified cerebrovascular disease affecting right dominant side: Secondary | ICD-10-CM | POA: Diagnosis not present

## 2014-10-12 DIAGNOSIS — I69920 Aphasia following unspecified cerebrovascular disease: Secondary | ICD-10-CM | POA: Diagnosis not present

## 2014-10-12 DIAGNOSIS — S329XXD Fracture of unspecified parts of lumbosacral spine and pelvis, subsequent encounter for fracture with routine healing: Secondary | ICD-10-CM | POA: Diagnosis not present

## 2014-10-12 DIAGNOSIS — I4891 Unspecified atrial fibrillation: Secondary | ICD-10-CM | POA: Diagnosis not present

## 2014-10-12 DIAGNOSIS — R1310 Dysphagia, unspecified: Secondary | ICD-10-CM | POA: Diagnosis not present

## 2014-10-13 ENCOUNTER — Ambulatory Visit (INDEPENDENT_AMBULATORY_CARE_PROVIDER_SITE_OTHER): Payer: Medicare Other | Admitting: Physician Assistant

## 2014-10-13 ENCOUNTER — Encounter: Payer: Self-pay | Admitting: Physician Assistant

## 2014-10-13 VITALS — BP 141/76 | HR 91 | Ht <= 58 in | Wt 103.0 lb

## 2014-10-13 DIAGNOSIS — I10 Essential (primary) hypertension: Secondary | ICD-10-CM

## 2014-10-13 DIAGNOSIS — I482 Chronic atrial fibrillation, unspecified: Secondary | ICD-10-CM

## 2014-10-13 DIAGNOSIS — Z950 Presence of cardiac pacemaker: Secondary | ICD-10-CM

## 2014-10-13 DIAGNOSIS — S329XXD Fracture of unspecified parts of lumbosacral spine and pelvis, subsequent encounter for fracture with routine healing: Secondary | ICD-10-CM | POA: Diagnosis not present

## 2014-10-13 DIAGNOSIS — I69951 Hemiplegia and hemiparesis following unspecified cerebrovascular disease affecting right dominant side: Secondary | ICD-10-CM | POA: Diagnosis not present

## 2014-10-13 DIAGNOSIS — I69991 Dysphagia following unspecified cerebrovascular disease: Secondary | ICD-10-CM | POA: Diagnosis not present

## 2014-10-13 DIAGNOSIS — I639 Cerebral infarction, unspecified: Secondary | ICD-10-CM | POA: Diagnosis not present

## 2014-10-13 DIAGNOSIS — I69920 Aphasia following unspecified cerebrovascular disease: Secondary | ICD-10-CM | POA: Diagnosis not present

## 2014-10-13 DIAGNOSIS — I4891 Unspecified atrial fibrillation: Secondary | ICD-10-CM | POA: Diagnosis not present

## 2014-10-13 DIAGNOSIS — R1310 Dysphagia, unspecified: Secondary | ICD-10-CM | POA: Diagnosis not present

## 2014-10-13 NOTE — Assessment & Plan Note (Signed)
Blood pressure is mildly elevated today however, according to her daughter and her caregiver pressures usually in the 120s.

## 2014-10-13 NOTE — Assessment & Plan Note (Signed)
This is followed by Dr. Rayann Heman she has an appointment after the first of the year

## 2014-10-13 NOTE — Patient Instructions (Signed)
Your physician recommends that you schedule a follow-up appointment in: 3 Months with Dr Neita Garnet  Check your weight on a daily basic and watch your diet Low sodium  Your physician recommends that you schedule a follow-up appointment with Neurologist Dr Leonie Man, Dr Corinna Lines, or Dr Daralene Milch

## 2014-10-13 NOTE — Progress Notes (Signed)
Date:  10/13/2014   ID:  Christy Miranda, DOB December 19, 1920, MRN [id number]  PCP:  Christy Coma, MD  Primary Cardiologist:  [place]     History of Present Illness: Christy Miranda is a 80 y.o. female history of stroke in July 2015. She had TPA administered with hemorrhagic conversion.Christy Miranda  2-D echocardiogram revealed an ejection fraction of 50-55% with normal wall motion. No regional wall motion abnormalities. Left atrium was moderately dilated. The right atrium was moderately dilated. She had peak PA pressures of 50 mmHg.  Her history also includes chronic atrial fibrillation, bradycardia, SSS, Pacemaker implantation. Hypothyroidism. DVT.  She is no longer on anticoagulation other than aspirin.  She's not had any follow-up with neurology since the hospitalization.  Presented today with her daughter and caregiver. She is still on metoprolol but is not back on Coumadin. She also used to be on 20 mg of Lasix daily.   She has a hard time communicating does appear to be able to answer with yes or no or head nods.  She says she sleeps well and does not appear that she is short of breath.  No lower extremity edema.    Wt Readings from Last 3 Encounters:  10/13/14 103 lb (46.72 kg)  06/21/14 112 lb 6.4 oz (50.984 kg)  06/13/14 89 lb (40.37 kg)     Past Medical History  Diagnosis Date  . HTN (hypertension)   . Permanent atrial fibrillation   . Bradycardia     s/p PPM  . Hypothyroidism   . Anxiety   . Diverticulitis     history of  . History of hysterectomy   . Long-term (current) use of anticoagulants   . Scoliosis (and kyphoscoliosis), idiopathic   . DVT (deep vein thrombosis) in pregnancy     right peroneal  . Dizziness and giddiness 07/22/2013    Current Outpatient Prescriptions  Medication Sig Dispense Refill  . acetaminophen (TYLENOL) 325 MG tablet Take 1 tablet (325 mg total) by mouth every 6 (six) hours as needed for mild pain, fever or headache.    . ALPRAZolam (XANAX) 0.25 MG  tablet Take 1 tablet (0.25 mg total) by mouth 3 (three) times daily as needed for anxiety. For anxiety 30 tablet 0  . aspirin 81 MG tablet Take 81 mg by mouth daily.    . cholecalciferol (VITAMIN D) 1000 UNITS tablet Take 1,000 Units by mouth daily.    Christy Miranda levothyroxine (SYNTHROID, LEVOTHROID) 50 MCG tablet Take 1 tablet (50 mcg total) by mouth daily. 90 tablet 3  . metoprolol (LOPRESSOR) 50 MG tablet Take 50 mg by mouth 2 (two) times daily.    . pantoprazole (PROTONIX) 40 MG tablet Take 40 mg by mouth daily.    . sertraline (ZOLOFT) 50 MG tablet Take 50 mg by mouth daily.     No current facility-administered medications for this visit.    Allergies:   No Known Allergies  Social History:  The patient  reports that she has never smoked. She has never used smokeless tobacco. She reports that she does not drink alcohol or use illicit drugs.   Family history:   Family History  Problem Relation Age of Onset  . Coronary artery disease Mother   . Cancer - Other Father     Pancreatic Cancer    ROS:  Please see the history of present illness.  All other systems reviewed and negative.   PHYSICAL EXAM: VS:  BP 141/76 mmHg  Pulse 91  Ht 4\' 10"  (  1.473 m)  Wt 103 lb (46.72 kg)  BMI 21.53 kg/m2 Well nourished, well developed, in no acute distress.  She appears to be sitting comfortably in the wheelchair HEENT: Pupils are equal round react to light accommodation extraocular movements are intact.  Neck: no JVDNo cervical lymphadenopathy. Cardiac: irregular rate and rhythm without murmurs rubs or gallops. Lungs:  clear to auscultation bilaterally, no wheezing, rhonchi or rales Abd: soft, nontender, positive bowel sounds all quadrants, no hepatosplenomegaly Ext: no lower extremity edema.  2+ radial and dorsalis pedis pulses. Skin: warm and dry Neuro:  expressive aphasia.right-sided weakness      ASSESSMENT AND PLAN:  Problem List Items Addressed This Visit    Atrial fibrillation - Primary     Patient's rate is stable.  We'll continue metoprolol and aspirin. She had a stroke earlier this summer with hemorrhage transformation after TPA administration.  She is also high fall risk think Coumadin is not advisable at this time.  Patient appears euvolemic at this visit.  It does not appear that she needs to be restarted on her Lasix at this time.her weight is actually decreased from 112 pounds on July 22, to 103 today.  Family will continue with daily weight monitoring.    Relevant Medications      aspirin 81 MG tablet      metoprolol (LOPRESSOR) 50 MG tablet   Hypertension    Blood pressure is mildly elevated today however, according to her daughter and her caregiver pressures usually in the 120s.    Relevant Medications      aspirin 81 MG tablet      metoprolol (LOPRESSOR) 50 MG tablet   Pacemaker-[employer]    This is followed by Dr. Rayann Heman she has an appointment after the first of the year

## 2014-10-13 NOTE — Assessment & Plan Note (Addendum)
Patient's rate is stable.  We'll continue metoprolol and aspirin. She had a stroke earlier this summer with hemorrhage transformation after TPA administration.  She is also high fall risk think Coumadin is not advisable at this time.  Patient appears euvolemic at this visit.  It does not appear that she needs to be restarted on her Lasix at this time.her weight is actually decreased from 112 pounds on July 22, to 103 today.  Family will continue with daily weight monitoring.

## 2014-10-16 DIAGNOSIS — I69991 Dysphagia following unspecified cerebrovascular disease: Secondary | ICD-10-CM | POA: Diagnosis not present

## 2014-10-16 DIAGNOSIS — I4891 Unspecified atrial fibrillation: Secondary | ICD-10-CM | POA: Diagnosis not present

## 2014-10-16 DIAGNOSIS — I69920 Aphasia following unspecified cerebrovascular disease: Secondary | ICD-10-CM | POA: Diagnosis not present

## 2014-10-16 DIAGNOSIS — I69951 Hemiplegia and hemiparesis following unspecified cerebrovascular disease affecting right dominant side: Secondary | ICD-10-CM | POA: Diagnosis not present

## 2014-10-16 DIAGNOSIS — R1310 Dysphagia, unspecified: Secondary | ICD-10-CM | POA: Diagnosis not present

## 2014-10-16 DIAGNOSIS — S329XXD Fracture of unspecified parts of lumbosacral spine and pelvis, subsequent encounter for fracture with routine healing: Secondary | ICD-10-CM | POA: Diagnosis not present

## 2014-10-17 DIAGNOSIS — S329XXD Fracture of unspecified parts of lumbosacral spine and pelvis, subsequent encounter for fracture with routine healing: Secondary | ICD-10-CM | POA: Diagnosis not present

## 2014-10-17 DIAGNOSIS — R1310 Dysphagia, unspecified: Secondary | ICD-10-CM | POA: Diagnosis not present

## 2014-10-17 DIAGNOSIS — I69951 Hemiplegia and hemiparesis following unspecified cerebrovascular disease affecting right dominant side: Secondary | ICD-10-CM | POA: Diagnosis not present

## 2014-10-17 DIAGNOSIS — I69991 Dysphagia following unspecified cerebrovascular disease: Secondary | ICD-10-CM | POA: Diagnosis not present

## 2014-10-17 DIAGNOSIS — I69920 Aphasia following unspecified cerebrovascular disease: Secondary | ICD-10-CM | POA: Diagnosis not present

## 2014-10-17 DIAGNOSIS — I4891 Unspecified atrial fibrillation: Secondary | ICD-10-CM | POA: Diagnosis not present

## 2014-10-17 NOTE — Addendum Note (Signed)
Addended by: Vennie Homans on: 10/17/2014 10:55 AM   Modules accepted: Orders

## 2014-10-18 ENCOUNTER — Encounter: Payer: Self-pay | Admitting: Neurology

## 2014-10-18 DIAGNOSIS — R1310 Dysphagia, unspecified: Secondary | ICD-10-CM | POA: Diagnosis not present

## 2014-10-18 DIAGNOSIS — I4891 Unspecified atrial fibrillation: Secondary | ICD-10-CM | POA: Diagnosis not present

## 2014-10-18 DIAGNOSIS — I69920 Aphasia following unspecified cerebrovascular disease: Secondary | ICD-10-CM | POA: Diagnosis not present

## 2014-10-18 DIAGNOSIS — I69951 Hemiplegia and hemiparesis following unspecified cerebrovascular disease affecting right dominant side: Secondary | ICD-10-CM | POA: Diagnosis not present

## 2014-10-18 DIAGNOSIS — I69991 Dysphagia following unspecified cerebrovascular disease: Secondary | ICD-10-CM | POA: Diagnosis not present

## 2014-10-18 DIAGNOSIS — S329XXD Fracture of unspecified parts of lumbosacral spine and pelvis, subsequent encounter for fracture with routine healing: Secondary | ICD-10-CM | POA: Diagnosis not present

## 2014-10-20 DIAGNOSIS — S329XXD Fracture of unspecified parts of lumbosacral spine and pelvis, subsequent encounter for fracture with routine healing: Secondary | ICD-10-CM | POA: Diagnosis not present

## 2014-10-20 DIAGNOSIS — I69991 Dysphagia following unspecified cerebrovascular disease: Secondary | ICD-10-CM | POA: Diagnosis not present

## 2014-10-20 DIAGNOSIS — I69920 Aphasia following unspecified cerebrovascular disease: Secondary | ICD-10-CM | POA: Diagnosis not present

## 2014-10-20 DIAGNOSIS — I4891 Unspecified atrial fibrillation: Secondary | ICD-10-CM | POA: Diagnosis not present

## 2014-10-20 DIAGNOSIS — R1310 Dysphagia, unspecified: Secondary | ICD-10-CM | POA: Diagnosis not present

## 2014-10-20 DIAGNOSIS — I69951 Hemiplegia and hemiparesis following unspecified cerebrovascular disease affecting right dominant side: Secondary | ICD-10-CM | POA: Diagnosis not present

## 2014-10-23 DIAGNOSIS — I69920 Aphasia following unspecified cerebrovascular disease: Secondary | ICD-10-CM | POA: Diagnosis not present

## 2014-10-23 DIAGNOSIS — I4891 Unspecified atrial fibrillation: Secondary | ICD-10-CM | POA: Diagnosis not present

## 2014-10-23 DIAGNOSIS — I69951 Hemiplegia and hemiparesis following unspecified cerebrovascular disease affecting right dominant side: Secondary | ICD-10-CM | POA: Diagnosis not present

## 2014-10-23 DIAGNOSIS — R1310 Dysphagia, unspecified: Secondary | ICD-10-CM | POA: Diagnosis not present

## 2014-10-23 DIAGNOSIS — I69991 Dysphagia following unspecified cerebrovascular disease: Secondary | ICD-10-CM | POA: Diagnosis not present

## 2014-10-23 DIAGNOSIS — S329XXD Fracture of unspecified parts of lumbosacral spine and pelvis, subsequent encounter for fracture with routine healing: Secondary | ICD-10-CM | POA: Diagnosis not present

## 2014-10-24 ENCOUNTER — Encounter: Payer: Self-pay | Admitting: Neurology

## 2014-10-24 DIAGNOSIS — S329XXD Fracture of unspecified parts of lumbosacral spine and pelvis, subsequent encounter for fracture with routine healing: Secondary | ICD-10-CM | POA: Diagnosis not present

## 2014-10-24 DIAGNOSIS — I69951 Hemiplegia and hemiparesis following unspecified cerebrovascular disease affecting right dominant side: Secondary | ICD-10-CM | POA: Diagnosis not present

## 2014-10-24 DIAGNOSIS — I69920 Aphasia following unspecified cerebrovascular disease: Secondary | ICD-10-CM | POA: Diagnosis not present

## 2014-10-24 DIAGNOSIS — R1310 Dysphagia, unspecified: Secondary | ICD-10-CM | POA: Diagnosis not present

## 2014-10-24 DIAGNOSIS — I4891 Unspecified atrial fibrillation: Secondary | ICD-10-CM | POA: Diagnosis not present

## 2014-10-24 DIAGNOSIS — I69991 Dysphagia following unspecified cerebrovascular disease: Secondary | ICD-10-CM | POA: Diagnosis not present

## 2014-10-27 DIAGNOSIS — I69991 Dysphagia following unspecified cerebrovascular disease: Secondary | ICD-10-CM | POA: Diagnosis not present

## 2014-10-27 DIAGNOSIS — I4891 Unspecified atrial fibrillation: Secondary | ICD-10-CM | POA: Diagnosis not present

## 2014-10-27 DIAGNOSIS — I69951 Hemiplegia and hemiparesis following unspecified cerebrovascular disease affecting right dominant side: Secondary | ICD-10-CM | POA: Diagnosis not present

## 2014-10-27 DIAGNOSIS — R1310 Dysphagia, unspecified: Secondary | ICD-10-CM | POA: Diagnosis not present

## 2014-10-27 DIAGNOSIS — S329XXD Fracture of unspecified parts of lumbosacral spine and pelvis, subsequent encounter for fracture with routine healing: Secondary | ICD-10-CM | POA: Diagnosis not present

## 2014-10-27 DIAGNOSIS — I69920 Aphasia following unspecified cerebrovascular disease: Secondary | ICD-10-CM | POA: Diagnosis not present

## 2014-10-30 ENCOUNTER — Encounter: Payer: Self-pay | Admitting: Neurology

## 2014-10-30 ENCOUNTER — Ambulatory Visit (INDEPENDENT_AMBULATORY_CARE_PROVIDER_SITE_OTHER): Payer: Medicare Other | Admitting: Neurology

## 2014-10-30 VITALS — BP 118/65 | HR 72 | Resp 18 | Ht <= 58 in | Wt 105.0 lb

## 2014-10-30 DIAGNOSIS — S329XXD Fracture of unspecified parts of lumbosacral spine and pelvis, subsequent encounter for fracture with routine healing: Secondary | ICD-10-CM | POA: Diagnosis not present

## 2014-10-30 DIAGNOSIS — I63032 Cerebral infarction due to thrombosis of left carotid artery: Secondary | ICD-10-CM | POA: Diagnosis not present

## 2014-10-30 DIAGNOSIS — I480 Paroxysmal atrial fibrillation: Secondary | ICD-10-CM

## 2014-10-30 DIAGNOSIS — I69951 Hemiplegia and hemiparesis following unspecified cerebrovascular disease affecting right dominant side: Secondary | ICD-10-CM | POA: Diagnosis not present

## 2014-10-30 DIAGNOSIS — I639 Cerebral infarction, unspecified: Secondary | ICD-10-CM | POA: Diagnosis not present

## 2014-10-30 DIAGNOSIS — I69920 Aphasia following unspecified cerebrovascular disease: Secondary | ICD-10-CM | POA: Diagnosis not present

## 2014-10-30 DIAGNOSIS — R1310 Dysphagia, unspecified: Secondary | ICD-10-CM | POA: Diagnosis not present

## 2014-10-30 DIAGNOSIS — I4891 Unspecified atrial fibrillation: Secondary | ICD-10-CM | POA: Diagnosis not present

## 2014-10-30 DIAGNOSIS — I69991 Dysphagia following unspecified cerebrovascular disease: Secondary | ICD-10-CM | POA: Diagnosis not present

## 2014-10-30 NOTE — Progress Notes (Signed)
HISTORY:  This patient is seen as a stroke follow up for Dr. Leonie Man, MD from 06-13-14  by Dr Brett Fairy, MD.  This patient has Dr. Floyde Parkins as her primary neurologist , who treated her last year for dizziness.   Christy Miranda is a 80 year old, caucasian right handed female, who suffered a stroke likely related to atrial fibrillation on 06-13-14. She had right-sided body weakness with left gaze preference and aphagia presenting as a left hemispheric brain stroke. TPA was given. The patient did develop a small bleed after the TPA treatment. She was then discharged for rehabilitation until 09-13-14. Follow-up with Dr. Leonie Man  he had not taken place. The patient is now seen upon request of her cardiologist .   Her speech output, her aphasia have remained. Her cognitive understanding of language is not impaired.   She tends to answer with yes or no only.   General: The patient is awake, alert and appears not in acute distress. The patient is well groomed. Head: Normocephalic, atraumatic. Neck is supple. Mallampati 2 with deviated uvula and tongue, neck circumference:13  Inches. Cardiovascular:  irregular rate and rhythm,  without  murmurs or carotid bruit, and without distended neck veins. Respiratory: Lungs are clear to auscultation. Skin:  Without evidence of edema, or rash Trunk: wheelchair bound.   Neurologic exam : The patient is awake and alert, oriented to place and time.   Memory not tested - neither  attention span & concentration ability.  Speech is non  fluent with dysarthria, dysphonia , aphasia. Mood and affect are appropriate.  Cranial nerves: Pupils are equal and briskly reactive to light. Funduscopic exam - cataract . [name] Extraocular movements  in vertical and horizontal planes intact and without nystagmus.  Left ptosis, right facial droop. Drifts to the left. Visual fields by finger perimetry are intact. Hearing to finger rub intact.  Facial sensation intact to fine  touch. Motor exam:  Normal tone bi ut right sided restricted strength-  Weaker grip, weaker shoulder shrug and weaker leg flexion.  DTR - asymmetric, right brisker than left . upgoing Babinski  Right And Left !   Coordination: Rapid alternating movements in the fingers/hands is abnormal, cannot follow instruction, right hand finger's were not fully flexed.  Finger-to-nose maneuver tested and abnormal.   Gait and station: deferred,       Assessment:  After physical and neurologic examination, review of laboratory studies, imaging, neurophysiology testing and pre-existing records, assessment;  Patent of Dr Jannifer Franklin and Dr Clydene Fake with a  a fib related embolic stroke, presenting after 3 month of rehab stay after TPA treatment.   Plan:  Treatment plan and additional workup will be reviewed.  Coumadin is posing a higher bleeding risk for this patient with high fall risk. There is a higher risk of re bleeding.  She should be evaluated by Dr. Leonie Man or Jannifer Franklin  6 mnth after her stroke and TPA, for possible Eloquis or Pradexa.       Dr Leonie Man , MD  on Stroke Service.  Christy Miranda is a 80 y.o. female with a history of afib on coumadin , who has recently had difficulty with regulating her INR.  She was seen to be normal at 7:15 am on 06/13/2014 when her tray was taken to her. At 8 - 8:30 when the tech went to get her tray, she noticed that she was different and EMS was called.  On arrival to the hospital, she was found  to be weak on the right with a left gaze preference and aphasia. after discussion with daughter and son the risks and benefits and fact that there is little data on patients in her age range with the 3 - 4 .5 hour window, but given the benefit seen in the population studied and severe nature of her deficits, I did offer tPA and family agreed she would want it despite risk. Following, She was admitted to the neuro ICU for further evaluation and treatment.  SUBJECTIVE Daughter at  bedside. ST just finished evaluating swallow.plan to chart diet . Await rehab decision  OBJECTIVE Most recent Vital Signs: Filed Vitals:   10/30/14 1412  BP: 118/65  Pulse: 72  Resp: 14  Height: 4\' 10"  (1.473 m)  Weight: 105 lb (47.628 kg)   CBG (last 3)  No results for input(s): GLUCAP in the last 72 hours.  IV Fluid Intake:     MEDICATIONS   PRN:      Diet:  NPO   Activity:  Up with assistance DVT Prophylaxis: SCDs   CLINICALLY SIGNIFICANT STUDIES Basic Metabolic Panel:  No results for input(s): NA, K, CL, CO2, GLUCOSE, BUN, CREATININE, CALCIUM, MG, PHOS in the last 168 hours. Liver Function Tests:  No results for input(s): AST, ALT, ALKPHOS, BILITOT, PROT, ALBUMIN in the last 168 hours. CBC:  No results for input(s): WBC, NEUTROABS, HGB, HCT, MCV, PLT in the last 168 hours. Coagulation:  No results for input(s): LABPROT, INR in the last 168 hours. Cardiac Enzymes: No results for input(s): CKTOTAL, CKMB, CKMBINDEX, TROPONINI in the last 168 hours. Urinalysis:  No results for input(s): COLORURINE, LABSPEC, PHURINE, GLUCOSEU, HGBUR, BILIRUBINUR, KETONESUR, PROTEINUR, UROBILINOGEN, NITRITE, LEUKOCYTESUR in the last 168 hours.  Invalid input(s): APPERANCEUR Lipid Panel    Component Value Date/Time   CHOL 132 06/14/2014 0229   TRIG 37 06/14/2014 0229   HDL 57 06/14/2014 0229   CHOLHDL 2.3 06/14/2014 0229   VLDL 7 06/14/2014 0229   LDLCALC 68 06/14/2014 0229   HgbA1C  Lab Results  Component Value Date   HGBA1C 6.1* 06/14/2014    Urine Drug Screen:      Component Value Date/Time   LABOPIA NONE DETECTED 06/13/2014 1000   COCAINSCRNUR NONE DETECTED 06/13/2014 1000   LABBENZ NONE DETECTED 06/13/2014 1000   AMPHETMU NONE DETECTED 06/13/2014 1000   THCU NONE DETECTED 06/13/2014 1000   LABBARB NONE DETECTED 06/13/2014 1000    Alcohol Level:  No results for input(s): ETH in the last 168 hours.  Ct Head Wo Contrast 06/15/2014     IMPRESSION: 1. Size stable  hematoma centered in the left putamen, up to 36 mm. Unchanged midline shift of 5 mm. 2. Increasingly conspicuous infarct in the left basal ganglia, insula, and left occipital lobe. Despite the occipital lobe involvement, the infarct is still from the left carotid circulation - the patient has a fetal left PCA on CTA 05/17/2013.    06/14/2014     IMPRESSION: New 3.6 cm left basal ganglia parenchymal hemorrhage, consistent with hemorrhagic transformation of an acute infarct. 5 mm rightward midline shift.   06/13/2014     IMPRESSION: No acute intracranial pathology.    MRI/MRIA of the brain  pacer  Carotid Doppler  No evidence of hemodynamically significant internal carotid artery stenosis. Vertebral artery flow is antegrade.   2D Echocardiogram  Left ventricle: The cavity size was normal. Wall thickness was increased in a pattern of mild LVH. There was focal basal hypertrophy. Systolic function was  normal. The estimated ejection fraction was in the range of 50% to 55%. Wall motion was normal; there were no regional wall motion abnormalities  Dg Chest Port 1 View 06/13/2014    IMPRESSION: Allowing for differences in positioning there has not been significant interval change since the previous study. Atelectasis or scarring at the lung bases is present.     EKG   . Atrial fibrillation. Right axis deviation. Borderline repolarization abnormality  Therapy Recommendations CIR  Physical Exam Blood pressure 118/65, pulse 72, resp. rate 14, height 4\' 10"  (1.473 m), weight 105 lb (47.628 kg). Gen: Patient is frail elderly woman in no acute distress.  Cardiac: RRR. S1S2 audible. No M/R/G.  Extremities: Cap refill <2 secs. No cyanosis or edema. Pulses 2+ radial and DP. Pulmonary: Respirations regular, symmetric. Lungs clear to auscultation bilat. Abd: Soft, non-tender. BS audible x 4 quadrants.  G/U: Deferred  MS: awake and tracks a little follows occasional midline commands. Globally aphasic. Speech  incomprehensible. gibberish Speech: Speech fluent and  mildy dysarthric. Not able to name and repeat.     CN: left gaze deviation. PERRL. EOMs intact. Facial sensation intact V1-3. No facial droop. Hearing grossly intact.   Sternocleidomastoids and trapezius 5/5 strength. Tongue midline,   no atrophy or fasciculations.  Strength: 5/5  In left extremities proximally and distally. Right hemiparesis with 1-2/5 RUE and 3/5 RLE weakness Sensation: Intact to light touch in all four extremities.  Coordination: cannot test due to aphasia Proprioception: Negative Romberg.   Reflexes: 2+ biceps, brachioradialis bilat. 2+ patellar, achilles bilat. No clonus. Downgoing toes on left and upgoing on right side    ASSESSMENT [name] is a 80 y.o. female presenting with altered mental status, aphasia and right hemiparesis. Status post IV t-PA 1034 on 06/13/2014 . Imaging shows left MCA branch infarct with asymptomatic post TPA hemorrhagic transformation. Infarct felt to be embolic secondary to  Atrial fibrillation with suboptimal anticoagulation.  On warfarin prior to admission, INR subtherapeutic on arrival at 1.35. Now on no anticoagulation secondary to hemorrhagic transfromation for secondary stroke prevention. Patient with resultant global aphasia, dysphagia and right hemiparesis. Stroke work up completed.dysphagi improved slightly   atrial fibrillation, not an anticoagulation candidate due to hemorrhage  LDL 68  A1c 6.4  Cachectic/frail Body mass index is 21.95 kg/(m^2).   Family agrees to DO NOT RESUSCITATE   Hospital day #   TREATMENT/PLAN  Repeat CT head on Monday to evaluate for hemorrhage resolution with consideration of starting aspirin 81 mg daily  IP rehab consult pending  Start D1 honey thick liquid diet as per speech therapy eval  D/w daughter x 10 mins   SIGNED Burnetta Sabin, MSN, RN, ANVP-BC, ANP-BC, GNP-BC [facility] Pager: [phone] 10/30/2014 2:34 PM    I, the attending vascular neurologist, have personally obtained a history, examined the patient, evaluated laboratory data and imaging studies, and formulated the assessment and plan of care.   I have made any additions or clarifications directly to the above note and agree with the findings and plan as currently documented.  SIGNED   Antony Contras, MD  To contact Stroke Continuity provider, please refer to [email or web address] After hours, contact General Neurology

## 2014-10-30 NOTE — Patient Instructions (Signed)
Revisit with Dr. Leonie Man. In January 2015.     Stroke Prevention Some health problems and behaviors may make it more likely for you to have a stroke. Below are ways to lessen your risk of having a stroke.   Be active for at least 30 minutes on most or all days.  Do not smoke. Try not to be around others who smoke.  Do not drink too much alcohol.  Do not have more than 2 drinks a day if you are a man.  Do not have more than 1 drink a day if you are a woman and are not pregnant.  Eat healthy foods, such as fruits and vegetables. If you were put on a specific diet, follow the diet as told.  Keep your cholesterol levels under control through diet and medicines. Look for foods that are low in saturated fat, trans fat, cholesterol, and are high in fiber.  If you have diabetes, follow all diet plans and take your medicine as told.  If you have high blood pressure (hypertension), follow all diet plans and take your medicine as told.  Keep a healthy weight. Eat foods that are low in calories, salt, saturated fat, trans fat, and cholesterol.  Do not take drugs.  Avoid birth control pills, if this applies. Talk to your doctor about the risks of taking birth control pills.  Talk to your doctor if you have sleep problems (sleep apnea).  Take all medicine as told by your doctor.  You may be told to take aspirin or blood thinner medicine. Take this medicine as told by your doctor.  Understand your medicine instructions.  Make sure any other conditions you have are being taken care of. GET HELP RIGHT AWAY IF:  You suddenly lose feeling (you feel numb) or have weakness in your face, arm, or leg.  Your face or eyelid hangs down to one side.  You suddenly feel confused.  You have trouble talking (aphasia) or understanding what people are saying.  You suddenly have trouble seeing in one or both eyes.  You suddenly have trouble walking.  You are dizzy.  You lose your balance or your  movements are clumsy (uncoordinated).  You suddenly have a very bad headache and you do not know the cause.  You have new chest pain.  Your heart feels like it is fluttering or skipping a beat (irregular heartbeat). Do not wait to see if the symptoms above go away. Get help right away. Call your local emergency services (911 in [place] Do not drive yourself to the hospital. Document Released: 05/18/2012 Document Revised: 04/03/2014 Document Reviewed: 05/20/2013 [facility] Patient Information 2015 [place], [place]. This information is not intended to replace advice given to you by your health care provider. Make sure you discuss any questions you have with your health care provider. r Leonie Man or Jannifer Franklin.

## 2014-10-31 DIAGNOSIS — I69991 Dysphagia following unspecified cerebrovascular disease: Secondary | ICD-10-CM | POA: Diagnosis not present

## 2014-10-31 DIAGNOSIS — S329XXD Fracture of unspecified parts of lumbosacral spine and pelvis, subsequent encounter for fracture with routine healing: Secondary | ICD-10-CM | POA: Diagnosis not present

## 2014-10-31 DIAGNOSIS — R1310 Dysphagia, unspecified: Secondary | ICD-10-CM | POA: Diagnosis not present

## 2014-10-31 DIAGNOSIS — I4891 Unspecified atrial fibrillation: Secondary | ICD-10-CM | POA: Diagnosis not present

## 2014-10-31 DIAGNOSIS — I69951 Hemiplegia and hemiparesis following unspecified cerebrovascular disease affecting right dominant side: Secondary | ICD-10-CM | POA: Diagnosis not present

## 2014-10-31 DIAGNOSIS — I69920 Aphasia following unspecified cerebrovascular disease: Secondary | ICD-10-CM | POA: Diagnosis not present

## 2014-11-01 DIAGNOSIS — I69951 Hemiplegia and hemiparesis following unspecified cerebrovascular disease affecting right dominant side: Secondary | ICD-10-CM | POA: Diagnosis not present

## 2014-11-01 DIAGNOSIS — S329XXD Fracture of unspecified parts of lumbosacral spine and pelvis, subsequent encounter for fracture with routine healing: Secondary | ICD-10-CM | POA: Diagnosis not present

## 2014-11-01 DIAGNOSIS — I69991 Dysphagia following unspecified cerebrovascular disease: Secondary | ICD-10-CM | POA: Diagnosis not present

## 2014-11-01 DIAGNOSIS — R1310 Dysphagia, unspecified: Secondary | ICD-10-CM | POA: Diagnosis not present

## 2014-11-01 DIAGNOSIS — I69920 Aphasia following unspecified cerebrovascular disease: Secondary | ICD-10-CM | POA: Diagnosis not present

## 2014-11-01 DIAGNOSIS — I4891 Unspecified atrial fibrillation: Secondary | ICD-10-CM | POA: Diagnosis not present

## 2014-11-03 DIAGNOSIS — I69951 Hemiplegia and hemiparesis following unspecified cerebrovascular disease affecting right dominant side: Secondary | ICD-10-CM | POA: Diagnosis not present

## 2014-11-03 DIAGNOSIS — I4891 Unspecified atrial fibrillation: Secondary | ICD-10-CM | POA: Diagnosis not present

## 2014-11-03 DIAGNOSIS — I69991 Dysphagia following unspecified cerebrovascular disease: Secondary | ICD-10-CM | POA: Diagnosis not present

## 2014-11-03 DIAGNOSIS — R1310 Dysphagia, unspecified: Secondary | ICD-10-CM | POA: Diagnosis not present

## 2014-11-03 DIAGNOSIS — S329XXD Fracture of unspecified parts of lumbosacral spine and pelvis, subsequent encounter for fracture with routine healing: Secondary | ICD-10-CM | POA: Diagnosis not present

## 2014-11-03 DIAGNOSIS — I69920 Aphasia following unspecified cerebrovascular disease: Secondary | ICD-10-CM | POA: Diagnosis not present

## 2014-11-06 DIAGNOSIS — R1310 Dysphagia, unspecified: Secondary | ICD-10-CM | POA: Diagnosis not present

## 2014-11-06 DIAGNOSIS — I69920 Aphasia following unspecified cerebrovascular disease: Secondary | ICD-10-CM | POA: Diagnosis not present

## 2014-11-06 DIAGNOSIS — I69991 Dysphagia following unspecified cerebrovascular disease: Secondary | ICD-10-CM | POA: Diagnosis not present

## 2014-11-06 DIAGNOSIS — I4891 Unspecified atrial fibrillation: Secondary | ICD-10-CM | POA: Diagnosis not present

## 2014-11-06 DIAGNOSIS — S329XXD Fracture of unspecified parts of lumbosacral spine and pelvis, subsequent encounter for fracture with routine healing: Secondary | ICD-10-CM | POA: Diagnosis not present

## 2014-11-06 DIAGNOSIS — I69951 Hemiplegia and hemiparesis following unspecified cerebrovascular disease affecting right dominant side: Secondary | ICD-10-CM | POA: Diagnosis not present

## 2014-11-07 DIAGNOSIS — S329XXD Fracture of unspecified parts of lumbosacral spine and pelvis, subsequent encounter for fracture with routine healing: Secondary | ICD-10-CM | POA: Diagnosis not present

## 2014-11-07 DIAGNOSIS — I69991 Dysphagia following unspecified cerebrovascular disease: Secondary | ICD-10-CM | POA: Diagnosis not present

## 2014-11-07 DIAGNOSIS — I69920 Aphasia following unspecified cerebrovascular disease: Secondary | ICD-10-CM | POA: Diagnosis not present

## 2014-11-07 DIAGNOSIS — R1310 Dysphagia, unspecified: Secondary | ICD-10-CM | POA: Diagnosis not present

## 2014-11-07 DIAGNOSIS — I4891 Unspecified atrial fibrillation: Secondary | ICD-10-CM | POA: Diagnosis not present

## 2014-11-07 DIAGNOSIS — I69951 Hemiplegia and hemiparesis following unspecified cerebrovascular disease affecting right dominant side: Secondary | ICD-10-CM | POA: Diagnosis not present

## 2014-11-08 DIAGNOSIS — I69951 Hemiplegia and hemiparesis following unspecified cerebrovascular disease affecting right dominant side: Secondary | ICD-10-CM | POA: Diagnosis not present

## 2014-11-08 DIAGNOSIS — I69991 Dysphagia following unspecified cerebrovascular disease: Secondary | ICD-10-CM | POA: Diagnosis not present

## 2014-11-08 DIAGNOSIS — S329XXD Fracture of unspecified parts of lumbosacral spine and pelvis, subsequent encounter for fracture with routine healing: Secondary | ICD-10-CM | POA: Diagnosis not present

## 2014-11-08 DIAGNOSIS — I69920 Aphasia following unspecified cerebrovascular disease: Secondary | ICD-10-CM | POA: Diagnosis not present

## 2014-11-08 DIAGNOSIS — R1310 Dysphagia, unspecified: Secondary | ICD-10-CM | POA: Diagnosis not present

## 2014-11-08 DIAGNOSIS — I4891 Unspecified atrial fibrillation: Secondary | ICD-10-CM | POA: Diagnosis not present

## 2014-11-09 ENCOUNTER — Encounter (HOSPITAL_COMMUNITY): Payer: Self-pay | Admitting: Internal Medicine

## 2014-11-09 DIAGNOSIS — I69951 Hemiplegia and hemiparesis following unspecified cerebrovascular disease affecting right dominant side: Secondary | ICD-10-CM | POA: Diagnosis not present

## 2014-11-09 DIAGNOSIS — I69920 Aphasia following unspecified cerebrovascular disease: Secondary | ICD-10-CM | POA: Diagnosis not present

## 2014-11-09 DIAGNOSIS — S329XXD Fracture of unspecified parts of lumbosacral spine and pelvis, subsequent encounter for fracture with routine healing: Secondary | ICD-10-CM | POA: Diagnosis not present

## 2014-11-09 DIAGNOSIS — R1310 Dysphagia, unspecified: Secondary | ICD-10-CM | POA: Diagnosis not present

## 2014-11-09 DIAGNOSIS — I69991 Dysphagia following unspecified cerebrovascular disease: Secondary | ICD-10-CM | POA: Diagnosis not present

## 2014-11-09 DIAGNOSIS — I4891 Unspecified atrial fibrillation: Secondary | ICD-10-CM | POA: Diagnosis not present

## 2014-11-10 DIAGNOSIS — I69951 Hemiplegia and hemiparesis following unspecified cerebrovascular disease affecting right dominant side: Secondary | ICD-10-CM | POA: Diagnosis not present

## 2014-11-10 DIAGNOSIS — S329XXD Fracture of unspecified parts of lumbosacral spine and pelvis, subsequent encounter for fracture with routine healing: Secondary | ICD-10-CM | POA: Diagnosis not present

## 2014-11-10 DIAGNOSIS — R1310 Dysphagia, unspecified: Secondary | ICD-10-CM | POA: Diagnosis not present

## 2014-11-10 DIAGNOSIS — I69991 Dysphagia following unspecified cerebrovascular disease: Secondary | ICD-10-CM | POA: Diagnosis not present

## 2014-11-10 DIAGNOSIS — I69920 Aphasia following unspecified cerebrovascular disease: Secondary | ICD-10-CM | POA: Diagnosis not present

## 2014-11-10 DIAGNOSIS — I4891 Unspecified atrial fibrillation: Secondary | ICD-10-CM | POA: Diagnosis not present

## 2014-11-12 DIAGNOSIS — I69920 Aphasia following unspecified cerebrovascular disease: Secondary | ICD-10-CM | POA: Diagnosis not present

## 2014-11-12 DIAGNOSIS — S329XXD Fracture of unspecified parts of lumbosacral spine and pelvis, subsequent encounter for fracture with routine healing: Secondary | ICD-10-CM | POA: Diagnosis not present

## 2014-11-12 DIAGNOSIS — R1310 Dysphagia, unspecified: Secondary | ICD-10-CM | POA: Diagnosis not present

## 2014-11-12 DIAGNOSIS — I4891 Unspecified atrial fibrillation: Secondary | ICD-10-CM | POA: Diagnosis not present

## 2014-11-12 DIAGNOSIS — I69991 Dysphagia following unspecified cerebrovascular disease: Secondary | ICD-10-CM | POA: Diagnosis not present

## 2014-11-12 DIAGNOSIS — I69951 Hemiplegia and hemiparesis following unspecified cerebrovascular disease affecting right dominant side: Secondary | ICD-10-CM | POA: Diagnosis not present

## 2014-11-13 DIAGNOSIS — R1310 Dysphagia, unspecified: Secondary | ICD-10-CM | POA: Diagnosis not present

## 2014-11-13 DIAGNOSIS — S329XXD Fracture of unspecified parts of lumbosacral spine and pelvis, subsequent encounter for fracture with routine healing: Secondary | ICD-10-CM | POA: Diagnosis not present

## 2014-11-13 DIAGNOSIS — I69920 Aphasia following unspecified cerebrovascular disease: Secondary | ICD-10-CM | POA: Diagnosis not present

## 2014-11-13 DIAGNOSIS — I69991 Dysphagia following unspecified cerebrovascular disease: Secondary | ICD-10-CM | POA: Diagnosis not present

## 2014-11-13 DIAGNOSIS — I69951 Hemiplegia and hemiparesis following unspecified cerebrovascular disease affecting right dominant side: Secondary | ICD-10-CM | POA: Diagnosis not present

## 2014-11-13 DIAGNOSIS — I4891 Unspecified atrial fibrillation: Secondary | ICD-10-CM | POA: Diagnosis not present

## 2014-11-17 ENCOUNTER — Encounter: Payer: Self-pay | Admitting: Internal Medicine

## 2014-12-04 ENCOUNTER — Ambulatory Visit (INDEPENDENT_AMBULATORY_CARE_PROVIDER_SITE_OTHER): Payer: Medicare Other | Admitting: Podiatry

## 2014-12-04 ENCOUNTER — Encounter: Payer: Self-pay | Admitting: Podiatry

## 2014-12-04 DIAGNOSIS — B351 Tinea unguium: Secondary | ICD-10-CM

## 2014-12-04 DIAGNOSIS — M79676 Pain in unspecified toe(s): Secondary | ICD-10-CM

## 2014-12-05 NOTE — Progress Notes (Signed)
Patient ID: Christy Miranda, female   DOB: 11/19/1921, 81 y.o.   MRN: [id number]  Subjective: This patient presents with her daughter and caregiver requesting debridement of painful toenails. The last visit for this service was 03/13/2014. Since this visit patient has fractured her pelvis and had a stroke.  Objective: The toenails second right and first and second left are not present The remaining toenails are elongated, hypertrophic, discolored and tender to palpation  Assessment: Symptomatic onychomycoses 7  Plan: Debridement toenails 7 without a bleeding  Reappoint 3 months

## 2014-12-11 ENCOUNTER — Encounter: Payer: Medicare Other | Admitting: Internal Medicine

## 2015-01-01 ENCOUNTER — Ambulatory Visit (INDEPENDENT_AMBULATORY_CARE_PROVIDER_SITE_OTHER): Payer: Medicare Other | Admitting: Neurology

## 2015-01-01 ENCOUNTER — Encounter: Payer: Self-pay | Admitting: Neurology

## 2015-01-01 VITALS — BP 113/65 | HR 70

## 2015-01-01 DIAGNOSIS — I69920 Aphasia following unspecified cerebrovascular disease: Secondary | ICD-10-CM | POA: Diagnosis not present

## 2015-01-01 DIAGNOSIS — I69320 Aphasia following cerebral infarction: Secondary | ICD-10-CM

## 2015-01-01 NOTE — Progress Notes (Signed)
Stroke Follow Up Visit Note   Christy Miranda is a 81 year old, caucasian right handed female, who suffered a stroke likely related to atrial fibrillation on 06-13-14. She had right-sided body weakness with left gaze preference and aphagsa presenting as a left hemispheric brain stroke. TPA was given. The patient did develop a small bleed after the TPA treatment. She was then discharged for rehabilitation until 09-13-14.  [name] Her   aphasia have remained. Her cognitive understanding of language is slightly impaired.  She was seen for follow-up by Dr. Brett Fairy on 10/30/14. Update 01/01/2015 : She is accompanied today by her daughter who states that the patient remains globally aphasic with mild right hemiparesis. She requires full 24-hour care. Patient has a caregiver during the day and the daughter is there at night. She is able to ambulate with one person assist using a walker but has had one fall a few months ago. She talks in [place] most of the time but occasionally she can understand short sentences of few words. She is on aspirin 81 mg daily and tolerating it well without bleeding or bruising. She has not had any recurrent stroke or TIA symptoms                 OBJECTIVE Most recent Vital Signs: Filed Vitals:   01/01/15 1453  BP: 113/65  Pulse: 70   CBG (last 3)  No results for input(s): GLUCAP in the last 72 hours.    CLINICALLY SIGNIFICANT STUDIES Basic Metabolic Panel:  No results for input(s): NA, K, CL, CO2, GLUCOSE, BUN, CREATININE, CALCIUM, MG, PHOS in the last 168 hours. Liver Function Tests:  No results for input(s): AST, ALT, ALKPHOS, BILITOT, PROT, ALBUMIN in the last 168 hours. CBC:  No results for input(s): WBC, NEUTROABS, HGB, HCT, MCV, PLT in the last 168 hours. Coagulation:  No results for input(s): LABPROT, INR in the last 168 hours. Cardiac Enzymes: No results for input(s): CKTOTAL, CKMB, CKMBINDEX, TROPONINI in the last 168 hours. Urinalysis:  No results for  input(s): COLORURINE, LABSPEC, PHURINE, GLUCOSEU, HGBUR, BILIRUBINUR, KETONESUR, PROTEINUR, UROBILINOGEN, NITRITE, LEUKOCYTESUR in the last 168 hours.  Invalid input(s): APPERANCEUR Lipid Panel    Component Value Date/Time   CHOL 132 06/14/2014 0229   TRIG 37 06/14/2014 0229   HDL 57 06/14/2014 0229   CHOLHDL 2.3 06/14/2014 0229   VLDL 7 06/14/2014 0229   LDLCALC 68 06/14/2014 0229   HgbA1C  Lab Results  Component Value Date   HGBA1C 6.1* 06/14/2014    Urine Drug Screen:      Component Value Date/Time   LABOPIA NONE DETECTED 06/13/2014 1000   COCAINSCRNUR NONE DETECTED 06/13/2014 1000   LABBENZ NONE DETECTED 06/13/2014 1000   AMPHETMU NONE DETECTED 06/13/2014 1000   THCU NONE DETECTED 06/13/2014 1000   LABBARB NONE DETECTED 06/13/2014 1000    Alcohol Level:  No results for input(s): ETH in the last 168 hours.  Ct Head Wo Contrast 06/15/2014     IMPRESSION: 1. Size stable hematoma centered in the left putamen, up to 36 mm. Unchanged midline shift of 5 mm. 2. Increasingly conspicuous infarct in the left basal ganglia, insula, and left occipital lobe. Despite the occipital lobe involvement, the infarct is still from the left carotid circulation - the patient has a fetal left PCA on CTA 05/17/2013.    06/14/2014     IMPRESSION: New 3.6 cm left basal ganglia parenchymal hemorrhage, consistent with hemorrhagic transformation of an acute infarct. 5 mm rightward midline  shift.   06/13/2014     IMPRESSION: No acute intracranial pathology.    MRI/MRIA of the brain  pacer  Carotid Doppler  No evidence of hemodynamically significant internal carotid artery stenosis. Vertebral artery flow is antegrade.   2D Echocardiogram  Left ventricle: The cavity size was normal. Wall thickness was increased in a pattern of mild LVH. There was focal basal hypertrophy. Systolic function was normal. The estimated ejection fraction was in the range of 50% to 55%. Wall motion was normal; there were no  regional wall motion abnormalities   General: The patient is awake, alert and appears not in acute distress. The patient is well groomed. Head: Normocephalic, atraumatic. Neck is supple. Mallampati 2 with deviated uvula and tongue, neck circumference:13  Inches. Cardiovascular:  irregular rate and rhythm,  without  murmurs or carotid bruit, and without distended neck veins. Respiratory: Lungs are clear to auscultation. Skin:  Without evidence of edema, or rash Trunk: wheelchair bound.      MS: awake and tracks a little follows occasional midline commands. Globally aphasic. Speech incomprehensible. gibberish Speech: Speech fluent and  mildy dysarthric. Not able to name and repeat.     CN: left gaze deviation. PERRL. EOMs intact. Facial sensation intact V1-3. No facial droop. Hearing grossly intact.  Right homonymous hemianopsia Sternocleidomastoids and trapezius 5/5 strength. Tongue midline,   no atrophy or fasciculations.  Strength: 5/5  In left extremities proximally and distally. Right hemiparesis with 3/5 RUE and 4/5 RLE weakness Sensation: Intact to light touch in all four extremities.  Coordination: cannot test due to aphasia Proprioception: Negative Romberg.   Reflexes: 2+ biceps, brachioradialis bilat. 2+ patellar, achilles bilat. No clonus. Downgoing toes on left and upgoing on right side    ASSESSMENT [name] is a 81 y.o. female presenting with altered mental status, aphasia and right hemiparesis. Status post IV t-PA 1034 on 06/13/2014 . Imaging shows left MCA branch infarct with asymptomatic post TPA hemorrhagic transformation. Infarct felt to be embolic secondary to  Atrial fibrillation with suboptimal anticoagulation.  On warfarin prior to admission, INR subtherapeutic on arrival at 1.35. Now on aspirin 81 mg   for secondary stroke prevention. Patient with resultant global aphasia,  and right hemiparesis.    Plan : I had a long d/w patient , daughter and caregiver about  her  stroke, risk for recurrent stroke/TIAs, personally independently reviewed imaging studies and stroke evaluation results and answered questions.Continue aspirin 81 mg orally every day  for secondary stroke prevention and maintain strict control of hypertension with blood pressure goal below 130/90, diabetes with hemoglobin A1c goal below 6.5% and lipids with LDL cholesterol goal below 100 mg/dL. I agree with not changing over to anticoagulation given her advanced age, prior history of tPA related hemorrhagic transformation and high fall risk makes her a poor candidate. Long discussion with the daughter with regards to risk for recurrent strokes with atrial fibrillation compared with increased risk of hemorrhage with anticoagulation. Followup in the future with me in  6 months or call earlier if necessary.   SIGNED   Antony Contras, MD   01/01/2015 3:29 PM      To contact Stroke Continuity provider, please refer to [email or web address] After hours, contact General Neurology

## 2015-01-01 NOTE — Patient Instructions (Signed)
I had a long d/w patient , daughter and caregiver about her  stroke, risk for recurrent stroke/TIAs, personally independently reviewed imaging studies and stroke evaluation results and answered questions.Continue aspirin 81 mg orally every day  for secondary stroke prevention and maintain strict control of hypertension with blood pressure goal below 130/90, diabetes with hemoglobin A1c goal below 6.5% and lipids with LDL cholesterol goal below 100 mg/dL. I agree with not changing over to anticoagulation given her advanced age, prior history of tPA related hemorrhagic transformation and high fall risk makes her a poor candidate. Long discussion with the daughter with regards to risk for recurrent strokes with atrial fibrillation compared with increased risk of hemorrhage with anticoagulation. Followup in the future with me in  6 months or call earlier if necessary.

## 2015-01-04 ENCOUNTER — Ambulatory Visit: Payer: Medicare Other | Admitting: Neurology

## 2015-01-12 DIAGNOSIS — L400 Psoriasis vulgaris: Secondary | ICD-10-CM | POA: Diagnosis not present

## 2015-01-12 DIAGNOSIS — Z85828 Personal history of other malignant neoplasm of skin: Secondary | ICD-10-CM | POA: Diagnosis not present

## 2015-01-15 ENCOUNTER — Encounter: Payer: Medicare Other | Admitting: Internal Medicine

## 2015-01-15 ENCOUNTER — Telehealth: Payer: Self-pay | Admitting: Internal Medicine

## 2015-01-15 NOTE — Telephone Encounter (Signed)
New Message  Pt daughter had to resch [facility] appt to April and wants to speak w/ Rn about a possible earlier appt (w/ [facility]red or device clinic) Please call back and discuss.

## 2015-01-16 NOTE — Telephone Encounter (Signed)
Melissa will call and schedule for Wed 2/24

## 2015-01-18 ENCOUNTER — Encounter: Payer: Self-pay | Admitting: Cardiology

## 2015-01-18 ENCOUNTER — Ambulatory Visit (INDEPENDENT_AMBULATORY_CARE_PROVIDER_SITE_OTHER): Payer: Medicare Other | Admitting: Cardiology

## 2015-01-18 VITALS — BP 142/74 | HR 66 | Ht <= 58 in | Wt 103.4 lb

## 2015-01-18 DIAGNOSIS — Z950 Presence of cardiac pacemaker: Secondary | ICD-10-CM

## 2015-01-18 DIAGNOSIS — I482 Chronic atrial fibrillation, unspecified: Secondary | ICD-10-CM

## 2015-01-18 DIAGNOSIS — I10 Essential (primary) hypertension: Secondary | ICD-10-CM

## 2015-01-18 DIAGNOSIS — I63032 Cerebral infarction due to thrombosis of left carotid artery: Secondary | ICD-10-CM | POA: Diagnosis not present

## 2015-01-18 MED ORDER — METOPROLOL TARTRATE 50 MG PO TABS: 50.0000 mg | ORAL_TABLET | Freq: Two times a day (BID) | ORAL | Status: DC

## 2015-01-18 NOTE — Progress Notes (Signed)
Date:  01/18/2015   ID:  Christy Miranda, DOB Apr 08, 1921, MRN [id number]  PCP:  [name], MD     History of Present Illness: Christy Miranda is a 81 y.o. female history of stroke in July 2015. She had TPA administered with hemorrhagic conversion.[name]  2-D echocardiogram revealed an ejection fraction of 50-55% with normal wall motion. No regional wall motion abnormalities. Left atrium was moderately dilated. The right atrium was moderately dilated. She had peak PA pressures of 50 mmHg.  Her history also includes chronic atrial fibrillation, bradycardia, SSS, Pacemaker implantation. Hypothyroidism. DVT.  She is no longer on anticoagulation other than aspirin.   She is seen today with her daughter and caregiver. She is still on metoprolol but is not back on Coumadin.   She still has some aphasia and it is difficult for her to communicate. She is living at home with 24 hour care. She is able to walk with a walker with assistance but still has right sided weakness.  She has no edema off lasix. No SOB. Her CVA occurred after she fell and had a pelvic fracture. She was in a nursing facility and it appears her coumadin levels were fluctuation. INR was low on admission.    Wt Readings from Last 3 Encounters:  01/18/15 103 lb 6.4 oz (46.902 kg)  10/30/14 105 lb (47.628 kg)  10/13/14 103 lb (46.72 kg)     Past Medical History  Diagnosis Date  . HTN (hypertension)   . Permanent atrial fibrillation   . Bradycardia     s/p PPM  . Hypothyroidism   . Anxiety   . Diverticulitis     history of  . History of hysterectomy   . Long-term (current) use of anticoagulants   . Scoliosis (and kyphoscoliosis), idiopathic   . DVT (deep vein thrombosis) in pregnancy     right peroneal  . Dizziness and giddiness 07/22/2013  . Stroke   . Pelvis fracture 05-24-14  . Atrial fibrillation     Current Outpatient Prescriptions  Medication Sig Dispense Refill  . acetaminophen (TYLENOL) 325 MG tablet Take 1  tablet (325 mg total) by mouth every 6 (six) hours as needed for mild pain, fever or headache.    . ALPRAZolam (XANAX) 0.25 MG tablet Take 1 tablet (0.25 mg total) by mouth 3 (three) times daily as needed for anxiety. For anxiety 30 tablet 0  . aspirin 81 MG tablet Take 81 mg by mouth daily.    . furosemide (LASIX) 20 MG tablet Take 20 mg by mouth as needed.     [name] levothyroxine (SYNTHROID, LEVOTHROID) 50 MCG tablet Take 1 tablet (50 mcg total) by mouth daily. 90 tablet 3  . metoprolol (LOPRESSOR) 50 MG tablet Take 1 tablet (50 mg total) by mouth 2 (two) times daily. 180 tablet 3  . pantoprazole (PROTONIX) 40 MG tablet Take 40 mg by mouth daily.    . sertraline (ZOLOFT) 50 MG tablet Take 50 mg by mouth daily.     No current facility-administered medications for this visit.    Allergies:   No Known Allergies  Social History:  The patient  reports that she has never smoked. She has never used smokeless tobacco. She reports that she does not drink alcohol or use illicit drugs.   Family history:   Family History  Problem Relation Age of Onset  . Coronary artery disease Mother   . Cancer - Other Father     Pancreatic Cancer    ROS:  Please see the history of present illness.  All other systems reviewed and negative.   PHYSICAL EXAM: VS:  BP 142/74 mmHg  Pulse 66  Ht 4\' 10"  (1.473 m)  Wt 103 lb 6.4 oz (46.902 kg)  BMI 21.62 kg/m2 Well nourished, well developed, in no acute distress.  She is sitting comfortably in the wheelchair HEENT: Pupils are equal round react to light accommodation extraocular movements are intact. Speech is difficult to understand Neck: no JVDNo cervical lymphadenopathy. Cardiac: irregular rate and rhythm without murmurs rubs or gallops. Lungs:  clear to auscultation bilaterally, no wheezing, rhonchi or rales Abd: soft, nontender, positive bowel sounds all quadrants, no hepatosplenomegaly Ext: no lower extremity edema.  2+ radial and dorsalis pedis pulses. Skin:  warm and dry Neuro:  expressive aphasia.right-sided weakness      ASSESSMENT AND PLAN:  1. Atrial fibrillation- chronic. Rate is well controlled. She is on ASA now. Coumadin has not been resumed due to increased risk of bleeding.   2. S/p embolic CVA  3. HTN controlled.  4. SSS s/p pacemaker. For followup in pacer clinic in August. It is difficult to transport patient now so maybe pacemaker follow up can be yearly.

## 2015-01-18 NOTE — Patient Instructions (Signed)
Continue your current therapy  I will see you in 6 months.   

## 2015-01-22 DIAGNOSIS — I509 Heart failure, unspecified: Secondary | ICD-10-CM | POA: Diagnosis not present

## 2015-01-23 ENCOUNTER — Telehealth: Payer: Self-pay | Admitting: Cardiology

## 2015-01-23 NOTE — Telephone Encounter (Signed)
Per Answering Service: Pt have been admitted to [facility].

## 2015-01-23 NOTE — Telephone Encounter (Signed)
Returned call to [place] [facility].Patient did qualify for their services.Will fax orders to PCP.

## 2015-01-24 ENCOUNTER — Encounter: Payer: Medicare Other | Admitting: Internal Medicine

## 2015-01-25 DIAGNOSIS — R4701 Aphasia: Secondary | ICD-10-CM | POA: Diagnosis not present

## 2015-01-25 DIAGNOSIS — I639 Cerebral infarction, unspecified: Secondary | ICD-10-CM | POA: Diagnosis not present

## 2015-01-25 DIAGNOSIS — F322 Major depressive disorder, single episode, severe without psychotic features: Secondary | ICD-10-CM | POA: Diagnosis not present

## 2015-01-25 DIAGNOSIS — I4891 Unspecified atrial fibrillation: Secondary | ICD-10-CM | POA: Diagnosis not present

## 2015-01-25 DIAGNOSIS — I482 Chronic atrial fibrillation: Secondary | ICD-10-CM | POA: Diagnosis not present

## 2015-01-25 DIAGNOSIS — R1310 Dysphagia, unspecified: Secondary | ICD-10-CM | POA: Diagnosis not present

## 2015-01-26 DIAGNOSIS — I509 Heart failure, unspecified: Secondary | ICD-10-CM | POA: Diagnosis not present

## 2015-01-30 DIAGNOSIS — I509 Heart failure, unspecified: Secondary | ICD-10-CM | POA: Diagnosis not present

## 2015-02-01 DIAGNOSIS — I509 Heart failure, unspecified: Secondary | ICD-10-CM | POA: Diagnosis not present

## 2015-02-06 DIAGNOSIS — I509 Heart failure, unspecified: Secondary | ICD-10-CM | POA: Diagnosis not present

## 2015-02-12 DIAGNOSIS — I509 Heart failure, unspecified: Secondary | ICD-10-CM | POA: Diagnosis not present

## 2015-02-20 ENCOUNTER — Ambulatory Visit: Payer: Self-pay | Admitting: Internal Medicine

## 2015-02-20 DIAGNOSIS — I509 Heart failure, unspecified: Secondary | ICD-10-CM | POA: Diagnosis not present

## 2015-02-20 DIAGNOSIS — I4891 Unspecified atrial fibrillation: Secondary | ICD-10-CM

## 2015-02-20 DIAGNOSIS — Z5181 Encounter for therapeutic drug level monitoring: Secondary | ICD-10-CM

## 2015-02-27 DIAGNOSIS — I509 Heart failure, unspecified: Secondary | ICD-10-CM | POA: Diagnosis not present

## 2015-03-02 DIAGNOSIS — I509 Heart failure, unspecified: Secondary | ICD-10-CM | POA: Diagnosis not present

## 2015-03-05 DIAGNOSIS — C4441 Basal cell carcinoma of skin of scalp and neck: Secondary | ICD-10-CM | POA: Diagnosis not present

## 2015-03-05 DIAGNOSIS — Z85828 Personal history of other malignant neoplasm of skin: Secondary | ICD-10-CM | POA: Diagnosis not present

## 2015-03-05 DIAGNOSIS — L400 Psoriasis vulgaris: Secondary | ICD-10-CM | POA: Diagnosis not present

## 2015-03-06 DIAGNOSIS — I509 Heart failure, unspecified: Secondary | ICD-10-CM | POA: Diagnosis not present

## 2015-03-12 ENCOUNTER — Encounter: Payer: Medicare Other | Admitting: Internal Medicine

## 2015-03-12 ENCOUNTER — Encounter: Payer: Self-pay | Admitting: Internal Medicine

## 2015-03-12 ENCOUNTER — Encounter: Payer: Self-pay | Admitting: Podiatry

## 2015-03-12 ENCOUNTER — Ambulatory Visit (INDEPENDENT_AMBULATORY_CARE_PROVIDER_SITE_OTHER): Payer: Medicare Other | Admitting: Podiatry

## 2015-03-12 ENCOUNTER — Ambulatory Visit (INDEPENDENT_AMBULATORY_CARE_PROVIDER_SITE_OTHER): Payer: Medicare Other | Admitting: Internal Medicine

## 2015-03-12 VITALS — BP 110/62 | HR 69 | Ht <= 58 in | Wt 106.4 lb

## 2015-03-12 DIAGNOSIS — I63032 Cerebral infarction due to thrombosis of left carotid artery: Secondary | ICD-10-CM | POA: Diagnosis not present

## 2015-03-12 DIAGNOSIS — M79676 Pain in unspecified toe(s): Secondary | ICD-10-CM

## 2015-03-12 DIAGNOSIS — I482 Chronic atrial fibrillation, unspecified: Secondary | ICD-10-CM

## 2015-03-12 DIAGNOSIS — I10 Essential (primary) hypertension: Secondary | ICD-10-CM

## 2015-03-12 DIAGNOSIS — I495 Sick sinus syndrome: Secondary | ICD-10-CM | POA: Diagnosis not present

## 2015-03-12 DIAGNOSIS — B351 Tinea unguium: Secondary | ICD-10-CM | POA: Diagnosis not present

## 2015-03-12 LAB — MDC_IDC_ENUM_SESS_TYPE_INCLINIC
Battery Impedance: 302 Ohm
Battery Voltage: 2.79 V
Date Time Interrogation Session: 20160411161826
Lead Channel Impedance Value: 0 Ohm
Lead Channel Impedance Value: 453 Ohm
Lead Channel Pacing Threshold Pulse Width: 0.4 ms
Lead Channel Setting Pacing Amplitude: 2.5 V
Lead Channel Setting Pacing Pulse Width: 0.4 ms
Lead Channel Setting Sensing Sensitivity: 2 mV
MDC IDC MSMT BATTERY REMAINING LONGEVITY: 105 mo
MDC IDC MSMT LEADCHNL RV PACING THRESHOLD AMPLITUDE: 1 V
MDC IDC MSMT LEADCHNL RV SENSING INTR AMPL: 5.6 mV
MDC IDC STAT BRADY RV PERCENT PACED: 21 %

## 2015-03-12 NOTE — Patient Instructions (Signed)
Your physician wants you to follow-up in: 12 months with Chanetta Marshall, NP You will receive a reminder letter in the mail two months in advance. If you don't receive a letter, please call our office to schedule the follow-up appointment.  Remote monitoring is used to monitor your Pacemaker of ICD from home. This monitoring reduces the number of office visits required to check your device to one time per year. It allows [place] to keep an eye on the functioning of your device to ensure it is working properly. You are scheduled for a device check from home on 06/11/15. You may send your transmission at any time that day. If you have a wireless device, the transmission will be sent automatically. After your physician reviews your transmission, you will receive a postcard with your next transmission date.

## 2015-03-12 NOTE — Progress Notes (Signed)
Electrophysiology Office Note   Date:  03/12/2015   ID:  Christy Miranda, DOB 1921/02/03, MRN [id number]  PCP:  [name], MD  Cardiologist:  Dr Martinique Primary Electrophysiologist: Thompson Grayer, MD    Chief Complaint  Patient presents with  . Atrial Fibrillation     History of Present Illness: Christy Miranda is a 81 y.o. female who presents today for electrophysiology evaluation.   Very slow recovery s/p prior stroke.  Not very active. Today, she denies symptoms of palpitations, chest pain, shortness of breath, orthopnea, PND, lower extremity edema, claudication, dizziness, presyncope, syncope, bleeding, or falls. The patient is tolerating medications without difficulties and is otherwise without complaint today.    Past Medical History  Diagnosis Date  . HTN (hypertension)   . Permanent atrial fibrillation   . Bradycardia     s/p PPM  . Hypothyroidism   . Anxiety   . Diverticulitis     history of  . History of hysterectomy   . Long-term (current) use of anticoagulants   . Scoliosis (and kyphoscoliosis), idiopathic   . DVT (deep vein thrombosis) in pregnancy     right peroneal  . Dizziness and giddiness 07/22/2013  . Stroke   . Pelvis fracture 05-24-14  . Atrial fibrillation    Past Surgical History  Procedure Laterality Date  . Pacemaker insertion  03/03/2006    most recent generator (MDT) change 10/30/06 by Dr Verlon Setting  . Cardiac catheterization  01/08/1999    normal LV function  . Cardioversion  10/30/2006    Successful elective DC cardioversion  . Cardioversion  12/29/2002    Successful DC cardioversion  . Cardioversion  12/03/1999    Successful DC cardioversion  . Cholecystectomy    . Pacemaker generator change N/A 11/09/2012    Procedure: PACEMAKER GENERATOR CHANGE;  Surgeon: Thompson Grayer, MD;  Location: [facility] CATH LAB;  Service: Cardiovascular;  Laterality: N/A;     Current Outpatient Prescriptions  Medication Sig Dispense Refill  . acetaminophen  (TYLENOL) 325 MG tablet Take 1 tablet (325 mg total) by mouth every 6 (six) hours as needed for mild pain, fever or headache.    . ALPRAZolam (XANAX) 0.25 MG tablet Take 1 tablet (0.25 mg total) by mouth 3 (three) times daily as needed for anxiety. For anxiety 30 tablet 0  . aspirin 81 MG tablet Take 81 mg by mouth daily.    . clobetasol cream (TEMOVATE) 0.05 % Apply to affected area on skin twice daily for 2 weeks then use daily as needed for flare ups  1  . furosemide (LASIX) 20 MG tablet Take 20 mg by mouth daily as needed (swelling or retaining fluid).     [name] levothyroxine (SYNTHROID, LEVOTHROID) 50 MCG tablet Take 1 tablet (50 mcg total) by mouth daily. 90 tablet 3  . metoprolol (LOPRESSOR) 50 MG tablet Take 1 tablet (50 mg total) by mouth 2 (two) times daily. 180 tablet 3  . pantoprazole (PROTONIX) 40 MG tablet Take 40 mg by mouth daily.    . sertraline (ZOLOFT) 50 MG tablet Take 50 mg by mouth daily.    [name] loratadine (CLARITIN) 10 MG tablet Take 10 mg by mouth daily as needed for allergies, rhinitis or itching.     No current facility-administered medications for this visit.    Allergies:   Review of patient's allergies indicates no known allergies.   Social History:  The patient  reports that she has never smoked. She has never used smokeless tobacco. She reports that  she does not drink alcohol or use illicit drugs.   Family History:  The patient's family history includes Cancer - Other in her father; Coronary artery disease in her mother.    ROS:  Please see the history of present illness.   All other systems are reviewed and negative.    PHYSICAL EXAM: VS:  BP 110/62 mmHg  Pulse 69  Ht 4\' 10"  (1.473 m)  Wt 106 lb 6.4 oz (48.263 kg)  BMI 22.24 kg/m2 , BMI Body mass index is 22.24 kg/(m^2). GEN: elderly and frail, in a wheelchair today in no acute distress HEENT: normal Neck: no JVD, carotid bruits, or masses Cardiac: iRRR  Respiratory:  clear to auscultation bilaterally,  normal work of breathing GI: soft, nontender, nondistended, + BS MS: diffuse muscle atrophy Skin: warm and dry, device pocket is well healed Psych: euthymic mood, full affect  Device interrogation is reviewed today in detail.  See PaceArt for details.   Recent Labs: 06/20/2014: ALT 10 07/05/2014: BUN 11; Creatinine 0.47*; Hemoglobin 11.8*; Platelets 144*; Potassium 4.6; Sodium 142    Lipid Panel     Component Value Date/Time   CHOL 132 06/14/2014 0229   TRIG 37 06/14/2014 0229   HDL 57 06/14/2014 0229   CHOLHDL 2.3 06/14/2014 0229   VLDL 7 06/14/2014 0229   LDLCALC 68 06/14/2014 0229     Wt Readings from Last 3 Encounters:  03/12/15 106 lb 6.4 oz (48.263 kg)  01/18/15 103 lb 6.4 oz (46.902 kg)  10/30/14 105 lb (47.628 kg)      Other studies Reviewed: Additional studies/ records that were reviewed today include: Dr Martinique    ASSESSMENT AND PLAN:   1, Symptomatic bradycardia Normal pacemaker function See Pace Art report No changes today  2. Permanent afib Continue rate control long term Dr Leonie Man (per family) has advised ASA over anticoagulation due to bleeding concerns at this time  3. HTN Stable No change required today  Enroll in carelink Return to see EP NP in 1 year  Current medicines are reviewed at length with the patient today.   The patient does not have concerns regarding her medicines.  The following changes were made today:  none  Signed, Thompson Grayer, MD  03/12/2015 3:33 PM     [place] [address] [phone] (office) [phone] (fax)

## 2015-03-13 NOTE — Progress Notes (Signed)
Patient ID: Christy Miranda, female   DOB: 03-08-1921, 81 y.o.   MRN: [id number]  Subjective: This patient presents with her daughter present in the room request debridement of painful toenails  Objective: Toenails 7 are discolored, elongated, hypertrophic and tender to direct palpation  Assessment: Symptomatic onychomycoses 7  Plan: Debrided toenails 7 without any bleeding  Reappoint 3 month

## 2015-03-15 DIAGNOSIS — I509 Heart failure, unspecified: Secondary | ICD-10-CM | POA: Diagnosis not present

## 2015-03-20 DIAGNOSIS — I509 Heart failure, unspecified: Secondary | ICD-10-CM | POA: Diagnosis not present

## 2015-03-21 ENCOUNTER — Telehealth: Payer: Self-pay | Admitting: Cardiology

## 2015-03-21 ENCOUNTER — Telehealth: Payer: Self-pay

## 2015-03-21 NOTE — Telephone Encounter (Signed)
°  1. Which medications need to be refilled? Levothyroxine  2. Which pharmacy is medication to be sent to?[phone]  3. Do they need a 30 day or 90 day supply? 90 and refills  4. Would they like a call back once the medication has been sent to the pharmacy? yes

## 2015-03-22 ENCOUNTER — Telehealth: Payer: Self-pay

## 2015-03-22 NOTE — Telephone Encounter (Signed)
Levothyroxine refill sent to PCP.

## 2015-03-22 NOTE — Telephone Encounter (Signed)
Prior note from Christy Miranda,this refill was sent to her PCP

## 2015-03-22 NOTE — Telephone Encounter (Signed)
Already sent back to pharmacy for PCP to refill.

## 2015-03-23 DIAGNOSIS — I509 Heart failure, unspecified: Secondary | ICD-10-CM | POA: Diagnosis not present

## 2015-03-26 DIAGNOSIS — I509 Heart failure, unspecified: Secondary | ICD-10-CM | POA: Diagnosis not present

## 2015-04-01 DIAGNOSIS — I509 Heart failure, unspecified: Secondary | ICD-10-CM | POA: Diagnosis not present

## 2015-04-02 DIAGNOSIS — I509 Heart failure, unspecified: Secondary | ICD-10-CM | POA: Diagnosis not present

## 2015-04-03 DIAGNOSIS — I509 Heart failure, unspecified: Secondary | ICD-10-CM | POA: Diagnosis not present

## 2015-04-04 DIAGNOSIS — I509 Heart failure, unspecified: Secondary | ICD-10-CM | POA: Diagnosis not present

## 2015-04-05 DIAGNOSIS — I509 Heart failure, unspecified: Secondary | ICD-10-CM | POA: Diagnosis not present

## 2015-04-06 DIAGNOSIS — I509 Heart failure, unspecified: Secondary | ICD-10-CM | POA: Diagnosis not present

## 2015-04-07 DIAGNOSIS — I509 Heart failure, unspecified: Secondary | ICD-10-CM | POA: Diagnosis not present

## 2015-04-08 DIAGNOSIS — I509 Heart failure, unspecified: Secondary | ICD-10-CM | POA: Diagnosis not present

## 2015-04-09 DIAGNOSIS — I509 Heart failure, unspecified: Secondary | ICD-10-CM | POA: Diagnosis not present

## 2015-04-10 DIAGNOSIS — I509 Heart failure, unspecified: Secondary | ICD-10-CM | POA: Diagnosis not present

## 2015-04-11 DIAGNOSIS — I509 Heart failure, unspecified: Secondary | ICD-10-CM | POA: Diagnosis not present

## 2015-04-12 DIAGNOSIS — I509 Heart failure, unspecified: Secondary | ICD-10-CM | POA: Diagnosis not present

## 2015-04-13 DIAGNOSIS — I509 Heart failure, unspecified: Secondary | ICD-10-CM | POA: Diagnosis not present

## 2015-04-14 DIAGNOSIS — I509 Heart failure, unspecified: Secondary | ICD-10-CM | POA: Diagnosis not present

## 2015-04-15 DIAGNOSIS — I509 Heart failure, unspecified: Secondary | ICD-10-CM | POA: Diagnosis not present

## 2015-04-16 DIAGNOSIS — I509 Heart failure, unspecified: Secondary | ICD-10-CM | POA: Diagnosis not present

## 2015-04-17 DIAGNOSIS — I509 Heart failure, unspecified: Secondary | ICD-10-CM | POA: Diagnosis not present

## 2015-04-18 DIAGNOSIS — I509 Heart failure, unspecified: Secondary | ICD-10-CM | POA: Diagnosis not present

## 2015-04-19 DIAGNOSIS — I509 Heart failure, unspecified: Secondary | ICD-10-CM | POA: Diagnosis not present

## 2015-04-20 DIAGNOSIS — I509 Heart failure, unspecified: Secondary | ICD-10-CM | POA: Diagnosis not present

## 2015-04-21 DIAGNOSIS — I509 Heart failure, unspecified: Secondary | ICD-10-CM | POA: Diagnosis not present

## 2015-04-22 DIAGNOSIS — I509 Heart failure, unspecified: Secondary | ICD-10-CM | POA: Diagnosis not present

## 2015-04-23 DIAGNOSIS — I509 Heart failure, unspecified: Secondary | ICD-10-CM | POA: Diagnosis not present

## 2015-04-24 DIAGNOSIS — I509 Heart failure, unspecified: Secondary | ICD-10-CM | POA: Diagnosis not present

## 2015-04-25 DIAGNOSIS — I509 Heart failure, unspecified: Secondary | ICD-10-CM | POA: Diagnosis not present

## 2015-04-26 DIAGNOSIS — I509 Heart failure, unspecified: Secondary | ICD-10-CM | POA: Diagnosis not present

## 2015-04-27 DIAGNOSIS — I509 Heart failure, unspecified: Secondary | ICD-10-CM | POA: Diagnosis not present

## 2015-04-28 DIAGNOSIS — I509 Heart failure, unspecified: Secondary | ICD-10-CM | POA: Diagnosis not present

## 2015-04-29 DIAGNOSIS — I509 Heart failure, unspecified: Secondary | ICD-10-CM | POA: Diagnosis not present

## 2015-04-30 DIAGNOSIS — I509 Heart failure, unspecified: Secondary | ICD-10-CM | POA: Diagnosis not present

## 2015-05-01 DIAGNOSIS — I509 Heart failure, unspecified: Secondary | ICD-10-CM | POA: Diagnosis not present

## 2015-05-02 DIAGNOSIS — I509 Heart failure, unspecified: Secondary | ICD-10-CM | POA: Diagnosis not present

## 2015-05-10 DIAGNOSIS — I509 Heart failure, unspecified: Secondary | ICD-10-CM | POA: Diagnosis not present

## 2015-05-15 DIAGNOSIS — I509 Heart failure, unspecified: Secondary | ICD-10-CM | POA: Diagnosis not present

## 2015-05-24 DIAGNOSIS — I509 Heart failure, unspecified: Secondary | ICD-10-CM | POA: Diagnosis not present

## 2015-05-29 DIAGNOSIS — I509 Heart failure, unspecified: Secondary | ICD-10-CM | POA: Diagnosis not present

## 2015-06-01 DIAGNOSIS — I509 Heart failure, unspecified: Secondary | ICD-10-CM | POA: Diagnosis not present

## 2015-06-08 DIAGNOSIS — I509 Heart failure, unspecified: Secondary | ICD-10-CM | POA: Diagnosis not present

## 2015-06-11 ENCOUNTER — Telehealth: Payer: Self-pay | Admitting: Cardiology

## 2015-06-11 ENCOUNTER — Ambulatory Visit (INDEPENDENT_AMBULATORY_CARE_PROVIDER_SITE_OTHER): Admitting: *Deleted

## 2015-06-11 DIAGNOSIS — I509 Heart failure, unspecified: Secondary | ICD-10-CM | POA: Diagnosis not present

## 2015-06-11 DIAGNOSIS — I495 Sick sinus syndrome: Secondary | ICD-10-CM

## 2015-06-11 NOTE — Telephone Encounter (Signed)
LMOVM reminding pt to send remote transmission.   

## 2015-06-12 ENCOUNTER — Encounter: Payer: Self-pay | Admitting: Cardiology

## 2015-06-12 DIAGNOSIS — I509 Heart failure, unspecified: Secondary | ICD-10-CM | POA: Diagnosis not present

## 2015-06-13 DIAGNOSIS — I495 Sick sinus syndrome: Secondary | ICD-10-CM

## 2015-06-14 NOTE — Progress Notes (Signed)
Remote pacemaker transmission.   

## 2015-06-18 LAB — CUP PACEART REMOTE DEVICE CHECK
Battery Impedance: 376 Ohm
Battery Remaining Longevity: 97 mo
Battery Voltage: 2.79 V
Brady Statistic RV Percent Paced: 38 %
Date Time Interrogation Session: 20160713194315
Lead Channel Impedance Value: 0 Ohm
Lead Channel Impedance Value: 474 Ohm
Lead Channel Pacing Threshold Amplitude: 0.875 V
Lead Channel Pacing Threshold Pulse Width: 0.4 ms
Lead Channel Sensing Intrinsic Amplitude: 5.6 mV
Lead Channel Setting Pacing Amplitude: 2.5 V
Lead Channel Setting Pacing Pulse Width: 0.4 ms
Lead Channel Setting Sensing Sensitivity: 2 mV

## 2015-06-21 DIAGNOSIS — I509 Heart failure, unspecified: Secondary | ICD-10-CM | POA: Diagnosis not present

## 2015-06-26 DIAGNOSIS — I509 Heart failure, unspecified: Secondary | ICD-10-CM | POA: Diagnosis not present

## 2015-07-02 ENCOUNTER — Ambulatory Visit: Payer: Self-pay | Admitting: Podiatry

## 2015-07-02 DIAGNOSIS — I509 Heart failure, unspecified: Secondary | ICD-10-CM | POA: Diagnosis not present

## 2015-07-03 ENCOUNTER — Ambulatory Visit: Payer: Self-pay | Admitting: Neurology

## 2015-07-04 ENCOUNTER — Ambulatory Visit: Admitting: Podiatry

## 2015-07-06 ENCOUNTER — Encounter: Payer: Self-pay | Admitting: Internal Medicine

## 2015-07-06 ENCOUNTER — Encounter: Payer: Self-pay | Admitting: Cardiology

## 2015-07-06 DIAGNOSIS — I509 Heart failure, unspecified: Secondary | ICD-10-CM | POA: Diagnosis not present

## 2015-07-12 DIAGNOSIS — I509 Heart failure, unspecified: Secondary | ICD-10-CM | POA: Diagnosis not present

## 2015-07-13 DIAGNOSIS — I509 Heart failure, unspecified: Secondary | ICD-10-CM | POA: Diagnosis not present

## 2015-07-19 DIAGNOSIS — I509 Heart failure, unspecified: Secondary | ICD-10-CM | POA: Diagnosis not present

## 2015-07-24 DIAGNOSIS — I509 Heart failure, unspecified: Secondary | ICD-10-CM | POA: Diagnosis not present

## 2015-07-26 DIAGNOSIS — M899 Disorder of bone, unspecified: Secondary | ICD-10-CM | POA: Diagnosis not present

## 2015-07-26 DIAGNOSIS — E780 Pure hypercholesterolemia: Secondary | ICD-10-CM | POA: Diagnosis not present

## 2015-07-26 DIAGNOSIS — E559 Vitamin D deficiency, unspecified: Secondary | ICD-10-CM | POA: Diagnosis not present

## 2015-07-26 DIAGNOSIS — Z79899 Other long term (current) drug therapy: Secondary | ICD-10-CM | POA: Diagnosis not present

## 2015-07-26 DIAGNOSIS — I69320 Aphasia following cerebral infarction: Secondary | ICD-10-CM | POA: Diagnosis not present

## 2015-07-26 DIAGNOSIS — I10 Essential (primary) hypertension: Secondary | ICD-10-CM | POA: Diagnosis not present

## 2015-07-26 DIAGNOSIS — F322 Major depressive disorder, single episode, severe without psychotic features: Secondary | ICD-10-CM | POA: Diagnosis not present

## 2015-07-26 DIAGNOSIS — Z23 Encounter for immunization: Secondary | ICD-10-CM | POA: Diagnosis not present

## 2015-07-26 DIAGNOSIS — M4004 Postural kyphosis, thoracic region: Secondary | ICD-10-CM | POA: Diagnosis not present

## 2015-07-26 DIAGNOSIS — D696 Thrombocytopenia, unspecified: Secondary | ICD-10-CM | POA: Diagnosis not present

## 2015-07-26 DIAGNOSIS — E039 Hypothyroidism, unspecified: Secondary | ICD-10-CM | POA: Diagnosis not present

## 2015-07-26 DIAGNOSIS — I481 Persistent atrial fibrillation: Secondary | ICD-10-CM | POA: Diagnosis not present

## 2015-07-30 DIAGNOSIS — I509 Heart failure, unspecified: Secondary | ICD-10-CM | POA: Diagnosis not present

## 2015-08-01 ENCOUNTER — Ambulatory Visit: Admitting: Podiatry

## 2015-08-02 ENCOUNTER — Encounter (HOSPITAL_COMMUNITY): Payer: Self-pay | Admitting: Emergency Medicine

## 2015-08-02 ENCOUNTER — Emergency Department (HOSPITAL_COMMUNITY): Payer: Medicare Other

## 2015-08-02 ENCOUNTER — Emergency Department (HOSPITAL_COMMUNITY)
Admission: EM | Admit: 2015-08-02 | Discharge: 2015-08-02 | Disposition: A | Discharge status: Home / Self Care | Payer: Medicare Other | Attending: Physician Assistant | Admitting: Physician Assistant

## 2015-08-02 DIAGNOSIS — Z79899 Other long term (current) drug therapy: Secondary | ICD-10-CM | POA: Diagnosis not present

## 2015-08-02 DIAGNOSIS — Z7901 Long term (current) use of anticoagulants: Secondary | ICD-10-CM | POA: Insufficient documentation

## 2015-08-02 DIAGNOSIS — R072 Precordial pain: Secondary | ICD-10-CM | POA: Diagnosis not present

## 2015-08-02 DIAGNOSIS — I639 Cerebral infarction, unspecified: Secondary | ICD-10-CM | POA: Diagnosis present

## 2015-08-02 DIAGNOSIS — F419 Anxiety disorder, unspecified: Secondary | ICD-10-CM | POA: Diagnosis not present

## 2015-08-02 DIAGNOSIS — I495 Sick sinus syndrome: Secondary | ICD-10-CM | POA: Diagnosis present

## 2015-08-02 DIAGNOSIS — Z86718 Personal history of other venous thrombosis and embolism: Secondary | ICD-10-CM | POA: Diagnosis not present

## 2015-08-02 DIAGNOSIS — Z7982 Long term (current) use of aspirin: Secondary | ICD-10-CM | POA: Diagnosis not present

## 2015-08-02 DIAGNOSIS — Z8719 Personal history of other diseases of the digestive system: Secondary | ICD-10-CM | POA: Diagnosis not present

## 2015-08-02 DIAGNOSIS — E039 Hypothyroidism, unspecified: Secondary | ICD-10-CM | POA: Insufficient documentation

## 2015-08-02 DIAGNOSIS — Z8673 Personal history of transient ischemic attack (TIA), and cerebral infarction without residual deficits: Secondary | ICD-10-CM | POA: Insufficient documentation

## 2015-08-02 DIAGNOSIS — I10 Essential (primary) hypertension: Secondary | ICD-10-CM | POA: Diagnosis not present

## 2015-08-02 DIAGNOSIS — R0789 Other chest pain: Secondary | ICD-10-CM | POA: Insufficient documentation

## 2015-08-02 DIAGNOSIS — R079 Chest pain, unspecified: Secondary | ICD-10-CM | POA: Diagnosis present

## 2015-08-02 DIAGNOSIS — I509 Heart failure, unspecified: Secondary | ICD-10-CM | POA: Diagnosis not present

## 2015-08-02 DIAGNOSIS — I482 Chronic atrial fibrillation: Secondary | ICD-10-CM | POA: Insufficient documentation

## 2015-08-02 DIAGNOSIS — Z8781 Personal history of (healed) traumatic fracture: Secondary | ICD-10-CM | POA: Diagnosis not present

## 2015-08-02 DIAGNOSIS — M40209 Unspecified kyphosis, site unspecified: Secondary | ICD-10-CM | POA: Diagnosis not present

## 2015-08-02 DIAGNOSIS — I4891 Unspecified atrial fibrillation: Secondary | ICD-10-CM | POA: Diagnosis present

## 2015-08-02 DIAGNOSIS — Z950 Presence of cardiac pacemaker: Secondary | ICD-10-CM | POA: Diagnosis present

## 2015-08-02 LAB — CBC WITH DIFFERENTIAL/PLATELET
Basophils Absolute: 0 10*3/uL (ref 0.0–0.1)
Basophils Relative: 1 % (ref 0–1)
Eosinophils Absolute: 0 10*3/uL (ref 0.0–0.7)
Eosinophils Relative: 1 % (ref 0–5)
HEMATOCRIT: 37.2 % (ref 36.0–46.0)
Hemoglobin: 12 g/dL (ref 12.0–15.0)
LYMPHS PCT: 25 % (ref 12–46)
Lymphs Abs: 0.8 10*3/uL (ref 0.7–4.0)
MCH: 30.6 pg (ref 26.0–34.0)
MCHC: 32.3 g/dL (ref 30.0–36.0)
MCV: 94.9 fL (ref 78.0–100.0)
MONOS PCT: 6 % (ref 3–12)
Monocytes Absolute: 0.2 10*3/uL (ref 0.1–1.0)
NEUTROS ABS: 2.3 10*3/uL (ref 1.7–7.7)
Neutrophils Relative %: 69 % (ref 43–77)
Platelets: 155 10*3/uL (ref 150–400)
RBC: 3.92 MIL/uL (ref 3.87–5.11)
RDW: 14.6 % (ref 11.5–15.5)
WBC: 3.3 10*3/uL — ABNORMAL LOW (ref 4.0–10.5)

## 2015-08-02 LAB — I-STAT TROPONIN, ED
Troponin i, poc: 0.05 ng/mL (ref 0.00–0.08)
Troponin i, poc: 0.04 ng/mL (ref 0.00–0.08)

## 2015-08-02 LAB — BASIC METABOLIC PANEL
ANION GAP: 5 (ref 5–15)
BUN: 12 mg/dL (ref 6–20)
CALCIUM: 8.9 mg/dL (ref 8.9–10.3)
CHLORIDE: 102 mmol/L (ref 101–111)
CO2: 29 mmol/L (ref 22–32)
Creatinine, Ser: 0.53 mg/dL (ref 0.44–1.00)
GFR calc Af Amer: >60 mL/min (ref >60)
GFR calc non Af Amer: >60 mL/min (ref >60)
GLUCOSE: 96 mg/dL (ref 65–99)
Potassium: 4.4 mmol/L (ref 3.5–5.1)
Sodium: 136 mmol/L (ref 135–145)

## 2015-08-02 LAB — PROTIME-INR
INR: 1.17 (ref 0.00–1.49)
PROTHROMBIN TIME: 15 s (ref 11.6–15.2)

## 2015-08-02 MED ORDER — ASPIRIN EC 325 MG PO TBEC
325.0000 mg | DELAYED_RELEASE_TABLET | Freq: Once | ORAL | Status: AC
  Administered 2015-08-02: 325 mg via ORAL
  Filled 2015-08-02: qty 1

## 2015-08-02 NOTE — ED Notes (Signed)
Pt refused meat and potato on tray, offered to cut up as daughter instructed.  Pt ate carrots and some cobbler.

## 2015-08-02 NOTE — ED Notes (Signed)
Initial EKG upon arrival on portable EKG machine did not cross over. Did a second EKG on phillips monitor and given to current MD.

## 2015-08-02 NOTE — ED Notes (Signed)
Ordered diet tray 

## 2015-08-02 NOTE — ED Notes (Signed)
[facility] at bedside

## 2015-08-02 NOTE — Discharge Instructions (Signed)
Please return with any increasing chest pain.  Chest Pain (Nonspecific) It is often hard to give a specific diagnosis for the cause of chest pain. There is always a chance that your pain could be related to something serious, such as a heart attack or a blood clot in the lungs. You need to follow up with your health care provider for further evaluation. CAUSES   Heartburn.  Pneumonia or bronchitis.  Anxiety or stress.  Inflammation around your heart (pericarditis) or lung (pleuritis or pleurisy).  A blood clot in the lung.  A collapsed lung (pneumothorax). It can develop suddenly on its own (spontaneous pneumothorax) or from trauma to the chest.  Shingles infection (herpes zoster virus). The chest wall is composed of bones, muscles, and cartilage. Any of these can be the source of the pain.  The bones can be bruised by injury.  The muscles or cartilage can be strained by coughing or overwork.  The cartilage can be affected by inflammation and become sore (costochondritis). DIAGNOSIS  Lab tests or other studies may be needed to find the cause of your pain. Your health care provider may have you take a test called an ambulatory electrocardiogram (ECG). An ECG records your heartbeat patterns over a 24-hour period. You may also have other tests, such as:  Transthoracic echocardiogram (TTE). During echocardiography, sound waves are used to evaluate how blood flows through your heart.  Transesophageal echocardiogram (TEE).  Cardiac monitoring. This allows your health care provider to monitor your heart rate and rhythm in real time.  Holter monitor. This is a portable device that records your heartbeat and can help diagnose heart arrhythmias. It allows your health care provider to track your heart activity for several days, if needed.  Stress tests by exercise or by giving medicine that makes the heart beat faster. TREATMENT   Treatment depends on what may be causing your chest pain.  Treatment may include:  Acid blockers for heartburn.  Anti-inflammatory medicine.  Pain medicine for inflammatory conditions.  Antibiotics if an infection is present.  You may be advised to change lifestyle habits. This includes stopping smoking and avoiding alcohol, caffeine, and chocolate.  You may be advised to keep your head raised (elevated) when sleeping. This reduces the chance of acid going backward from your stomach into your esophagus. Most of the time, nonspecific chest pain will improve within 2-3 days with rest and mild pain medicine.  HOME CARE INSTRUCTIONS   If antibiotics were prescribed, take them as directed. Finish them even if you start to feel better.  For the next few days, avoid physical activities that bring on chest pain. Continue physical activities as directed.  Do not use any tobacco products, including cigarettes, chewing tobacco, or electronic cigarettes.  Avoid drinking alcohol.  Only take medicine as directed by your health care provider.  Follow your health care provider's suggestions for further testing if your chest pain does not go away.  Keep any follow-up appointments you made. If you do not go to an appointment, you could develop lasting (chronic) problems with pain. If there is any problem keeping an appointment, call to reschedule. SEEK MEDICAL CARE IF:   Your chest pain does not go away, even after treatment.  You have a rash with blisters on your chest.  You have a fever. SEEK IMMEDIATE MEDICAL CARE IF:   You have increased chest pain or pain that spreads to your arm, neck, jaw, back, or abdomen.  You have shortness of breath.  You have an increasing cough, or you cough up blood.  You have severe back or abdominal pain.  You feel nauseous or vomit.  You have severe weakness.  You faint.  You have chills. This is an emergency. Do not wait to see if the pain will go away. Get medical help at once. Call your local emergency  services (911 in [place] Do not drive yourself to the hospital. MAKE SURE YOU:   Understand these instructions.  Will watch your condition.  Will get help right away if you are not doing well or get worse. Document Released: 08/27/2005 Document Revised: 11/22/2013 Document Reviewed: 06/22/2008 [facility] Patient Information 2015 [place], [place]. This information is not intended to replace advice given to you by your health care provider. Make sure you discuss any questions you have with your health care provider.

## 2015-08-02 NOTE — H&P (Signed)
Christy Miranda is an 81 y.o. female.    Primary Cardiologist: Dr. Martinique EP: Dr. Rayann Heman  PCP: [name], MD  Chief Complaint: chest pain, noted by pt grabbing her chest  HPI: 81 year old female with hx CVA 2015, TPA and hemorrhagic conversion. She still has some aphasia and it is difficult for her to communicate.  Other hx with chronic a fib, PPM for SSS, hypothyroidism, DVT.  Last echo 2015 with EF 50-55%, mild LVH, LA was moderately dilated, RA was moderately dilated, PA pk pressure 50 mmHg.[name] No caths no nuc done recently.  No hx of MI.   No longer on coumadin.  Today she grabbed her chest and had some SOB, which the SOB has been occuring briefly for last 3 weeks or so.  They now have home oxygen which she did not wish to use today due to the way it smelled.  She has aphasia and I cannot understand her, her daughter seems to understand some of what she states.    EKG with new T wave inversion in lat leads and her old tw wave in version in III is now deeper.  Though looking back at previous EKG similar pattern. Troponin 0.05.   No pain now appears to be comfortable.  Seems to want to talk about her legs.    Past Medical History  Diagnosis Date  . HTN (hypertension)   . Permanent atrial fibrillation   . Bradycardia     s/p PPM  . Hypothyroidism   . Anxiety   . Diverticulitis     history of  . History of hysterectomy   . Long-term (current) use of anticoagulants   . Scoliosis (and kyphoscoliosis), idiopathic   . DVT (deep vein thrombosis) in pregnancy     right peroneal  . Dizziness and giddiness 07/22/2013  . Stroke   . Pelvis fracture 05-24-14  . Atrial fibrillation     Past Surgical History  Procedure Laterality Date  . Pacemaker insertion  03/03/2006    most recent generator (MDT) change 10/30/06 by Dr Verlon Setting  . Cardiac catheterization  01/08/1999    normal LV function  . Cardioversion  10/30/2006    Successful elective DC cardioversion  .  Cardioversion  12/29/2002    Successful DC cardioversion  . Cardioversion  12/03/1999    Successful DC cardioversion  . Cholecystectomy    . Pacemaker generator change N/A 11/09/2012    Procedure: PACEMAKER GENERATOR CHANGE;  Surgeon: Thompson Grayer, MD;  Location: [facility] CATH LAB;  Service: Cardiovascular;  Laterality: N/A;    Family History  Problem Relation Age of Onset  . Coronary artery disease Mother   . Cancer - Other Father     Pancreatic Cancer   Social History:  reports that she has never smoked. She has never used smokeless tobacco. She reports that she does not drink alcohol or use illicit drugs.  Allergies: No Known Allergies  OUTPATIENT MEDICATIONS: No current facility-administered medications on file prior to encounter.   Current Outpatient Prescriptions on File Prior to Encounter  Medication Sig Dispense Refill  . acetaminophen (TYLENOL) 325 MG tablet Take 1 tablet (325 mg total) by mouth every 6 (six) hours as needed for mild pain, fever or headache.    . ALPRAZolam (XANAX) 0.25 MG tablet Take 1 tablet (0.25 mg total) by mouth 3 (three) times daily as needed for anxiety. For anxiety (Patient taking differently: Take 0.25 mg by mouth 3 (three) times  daily. For anxiety) 30 tablet 0  . aspirin 81 MG tablet Take 81 mg by mouth daily.    . furosemide (LASIX) 20 MG tablet Take 20 mg by mouth daily as needed (swelling or retaining fluid).     [name] levothyroxine (SYNTHROID, LEVOTHROID) 50 MCG tablet Take 1 tablet (50 mcg total) by mouth daily. 90 tablet 3  . loratadine (CLARITIN) 10 MG tablet Take 10 mg by mouth daily as needed for allergies, rhinitis or itching.    . metoprolol (LOPRESSOR) 50 MG tablet Take 1 tablet (50 mg total) by mouth 2 (two) times daily. 180 tablet 3  . pantoprazole (PROTONIX) 40 MG tablet Take 40 mg by mouth daily.    . sertraline (ZOLOFT) 50 MG tablet Take 50 mg by mouth daily.       Results for orders placed or performed during the hospital encounter of  08/02/15 (from the past 48 hour(s))  CBC with Differential/Platelet     Status: Abnormal   Collection Time: 08/02/15  3:43 PM  Result Value Ref Range   WBC 3.3 (L) 4.0 - 10.5 K/uL   RBC 3.92 3.87 - 5.11 MIL/uL   Hemoglobin 12.0 12.0 - 15.0 g/dL   HCT 37.2 36.0 - 46.0 %   MCV 94.9 78.0 - 100.0 fL   MCH 30.6 26.0 - 34.0 pg   MCHC 32.3 30.0 - 36.0 g/dL   RDW 14.6 11.5 - 15.5 %   Platelets 155 150 - 400 K/uL   Neutrophils Relative % 69 43 - 77 %   Neutro Abs 2.3 1.7 - 7.7 K/uL   Lymphocytes Relative 25 12 - 46 %   Lymphs Abs 0.8 0.7 - 4.0 K/uL   Monocytes Relative 6 3 - 12 %   Monocytes Absolute 0.2 0.1 - 1.0 K/uL   Eosinophils Relative 1 0 - 5 %   Eosinophils Absolute 0.0 0.0 - 0.7 K/uL   Basophils Relative 1 0 - 1 %   Basophils Absolute 0.0 0.0 - 0.1 K/uL  Basic metabolic panel     Status: None   Collection Time: 08/02/15  3:43 PM  Result Value Ref Range   Sodium 136 135 - 145 mmol/L   Potassium 4.4 3.5 - 5.1 mmol/L   Chloride 102 101 - 111 mmol/L   CO2 29 22 - 32 mmol/L   Glucose, Bld 96 65 - 99 mg/dL   BUN 12 6 - 20 mg/dL   Creatinine, Ser 0.53 0.44 - 1.00 mg/dL   Calcium 8.9 8.9 - 10.3 mg/dL   GFR calc non Af Amer >60 >60 mL/min   GFR calc Af Amer >60 >60 mL/min    Comment: (NOTE) The eGFR has been calculated using the CKD EPI equation. This calculation has not been validated in all clinical situations. eGFR's persistently <60 mL/min signify possible Chronic Kidney Disease.    Anion gap 5 5 - 15  Protime-INR     Status: None   Collection Time: 08/02/15  3:43 PM  Result Value Ref Range   Prothrombin Time 15.0 11.6 - 15.2 seconds   INR 1.17 0.00 - 1.49  I-stat troponin, ED     Status: None   Collection Time: 08/02/15  3:54 PM  Result Value Ref Range   Troponin i, poc 0.05 0.00 - 0.08 ng/mL   Comment 3            Comment: Due to the release kinetics of cTnI, a negative result within the first hours of the onset of  symptoms does not rule out myocardial infarction  with certainty. If myocardial infarction is still suspected, repeat the test at appropriate intervals.    Dg Chest 2 View  08/02/2015   CLINICAL DATA:  Chest pain, dizziness and bradycardia.  EXAM: CHEST  2 VIEW  COMPARISON:  Single view of the chest 06/13/2014.  FINDINGS: Asymmetric elevation of the right hemidiaphragm is again seen. The lungs are clear. Mild cardiomegaly is noted. No pneumothorax or pleural effusion.  IMPRESSION: No acute disease stable compared to prior exam.   Electronically Signed   By: Inge Rise M.D.   On: 08/02/2015 16:44    ROS: unable to obtain   Blood pressure 148/66, pulse 83, temperature 98.2 F (36.8 C), temperature source Oral, resp. rate 21, SpO2 98 %. PE: General:Pleasant affect, NAD unable to understand Skin:Warm and dry, brisk capillary refill HEENT:normocephalic, sclera clear, mucus membranes moist Neck:supple, no JVD, no bruits  Heart:irreg irreg  without murmur, gallup, rub or click Lungs:clear without rales, rhonchi, or wheezes XAJ:OINO, non tender, + BS, do not palpate liver spleen or masses Ext:no lower ext edema, 1+ pedal pulses, 2+ radial pulses Neuro:alert and oriented from her actions but I did not understand speech. , MAE, follows commands, + facial symmetry    Assessment/Plan Principal Problem:   Chest pain at rest Active Problems:   Atrial fibrillation   Tachycardia-bradycardia syndrome   Pacemaker-[employer]   CVA (cerebral infarction)  Recheck troponin and hopefully discharge home. Will have follow up in the office.  With pt's dementia it would be best to go home unless significant + troponin.     [place] Nurse Practitioner Certified [place] Pager [phone] or after 5pm or weekends call [phone] 08/02/2015, 5:51 PM  History and all data above reviewed.  Patient examined.  I agree with the findings as above.  The patient is difficult to interview because of aphasia.  However, she does not  report any discomfort currently.  She seems to have had some discomfort earlier today.  She has had an EKG which looks like LVH with repolarization.  Although this is different than her most recent EKG it is similar to past EKGs that I was able to reviewThe patient exam reveals OBS:[id number]  ,  Lungs: Clear  ,  Abd: Positive bowel sounds, no rebound no guarding, Ext No edema   .  All available labs, radiology testing, previous records reviewed. Agree with documented assessment and plan. Chest pain:  It is very vague.  I think that conservative therapy is indicated and she is clearly not having pain now and is quite comfortable. Her daughter would like to take her home and I think this is safe and reasonable.  She can call [phone] if she has recurrent symptoms.    Jeneen Rinks Che Rachal  6:17 PM  08/02/2015

## 2015-08-02 NOTE — ED Notes (Addendum)
EMS - Patient coming from home with c/o of central chest pain with no verbal complaint only visible sign of grabbing chest with CNA that lasted approximatelyh 10 minutes.  Pacemaker in place.  No medications administered.

## 2015-08-02 NOTE — ED Notes (Signed)
Pt stable, ambulatory, states understanding of discharge instructions, daughter at bedside 

## 2015-08-02 NOTE — ED Provider Notes (Signed)
CSN: [id number]     Arrival date & time 08/02/15  1448 History   First MD Initiated Contact with Patient 08/02/15 1500     Chief Complaint  Patient presents with  . Chest Pain     (Consider location/radiation/quality/duration/timing/severity/associated sxs/prior Treatment) HPI   Patient is a 81 year old female with past history significant for hypertension A. fib bradycardia pacemaker and multiple cardioversions, presenting today with chest pain. Patient is demented and therefore difficult to get a history from. However caretaker states that she clutched her chest around an hour and a half ago. It went away and then when asked again, 1/2 hour ago, patient clutched her chest again. Patient appears comfortable on exam.  Level V caveat dementia  Past Medical History  Diagnosis Date  . HTN (hypertension)   . Permanent atrial fibrillation   . Bradycardia     s/p PPM  . Hypothyroidism   . Anxiety   . Diverticulitis     history of  . History of hysterectomy   . Long-term (current) use of anticoagulants   . Scoliosis (and kyphoscoliosis), idiopathic   . DVT (deep vein thrombosis) in pregnancy     right peroneal  . Dizziness and giddiness 07/22/2013  . Stroke   . Pelvis fracture 05-24-14  . Atrial fibrillation    Past Surgical History  Procedure Laterality Date  . Pacemaker insertion  03/03/2006    most recent generator (MDT) change 10/30/06 by Dr Verlon Setting  . Cardiac catheterization  01/08/1999    normal LV function  . Cardioversion  10/30/2006    Successful elective DC cardioversion  . Cardioversion  12/29/2002    Successful DC cardioversion  . Cardioversion  12/03/1999    Successful DC cardioversion  . Cholecystectomy    . Pacemaker generator change N/A 11/09/2012    Procedure: PACEMAKER GENERATOR CHANGE;  Surgeon: Thompson Grayer, MD;  Location: [facility] CATH LAB;  Service: Cardiovascular;  Laterality: N/A;   Family History  Problem Relation Age of Onset  . Coronary artery disease  Mother   . Cancer - Other Father     Pancreatic Cancer   Social History  Substance Use Topics  . Smoking status: Never Smoker   . Smokeless tobacco: Never Used  . Alcohol Use: No   OB History    No data available     Review of Systems  Unable to perform ROS: Dementia  Constitutional: Negative for activity change and fatigue.  Eyes: Negative for discharge.  Respiratory: Positive for chest tightness. Negative for cough.   Cardiovascular: Positive for chest pain.  Gastrointestinal: Negative for abdominal distention.  Skin: Negative for rash.  Allergic/Immunologic: Negative for immunocompromised state.  Psychiatric/Behavioral: Negative for behavioral problems and agitation.      Allergies  Review of patient's allergies indicates no known allergies.  Home Medications   Prior to Admission medications   Medication Sig Start Date End Date Taking? Authorizing Provider  acetaminophen (TYLENOL) 325 MG tablet Take 1 tablet (325 mg total) by mouth every 6 (six) hours as needed for mild pain, fever or headache. 05/25/14   Kinnie Feil, MD  ALPRAZolam Duanne Moron) 0.25 MG tablet Take 1 tablet (0.25 mg total) by mouth 3 (three) times daily as needed for anxiety. For anxiety 05/25/14   Kinnie Feil, MD  aspirin 81 MG tablet Take 81 mg by mouth daily.    Historical Provider, MD  clobetasol cream (TEMOVATE) 0.05 % Apply to affected area on skin twice daily for 2 weeks then use daily  as needed for flare ups 03/06/15   Historical Provider, MD  furosemide (LASIX) 20 MG tablet Take 20 mg by mouth daily as needed (swelling or retaining fluid).  10/24/14   Historical Provider, MD  levothyroxine (SYNTHROID, LEVOTHROID) 50 MCG tablet Take 1 tablet (50 mcg total) by mouth daily. 01/19/14   Peter M Martinique, MD  loratadine (CLARITIN) 10 MG tablet Take 10 mg by mouth daily as needed for allergies, rhinitis or itching.    Historical Provider, MD  metoprolol (LOPRESSOR) 50 MG tablet Take 1 tablet (50 mg total)  by mouth 2 (two) times daily. 01/18/15   Peter M Martinique, MD  pantoprazole (PROTONIX) 40 MG tablet Take 40 mg by mouth daily.    Historical Provider, MD  sertraline (ZOLOFT) 50 MG tablet Take 50 mg by mouth daily. 10/11/14   Historical Provider, MD   BP 151/80 mmHg  Pulse 70  Temp(Src) 98.2 F (36.8 C) (Oral)  Resp 19  SpO2 100% Physical Exam  Constitutional: She is oriented to person, place, and time. She appears well-developed and well-nourished.  24 elderly female  HENT:  Head: Normocephalic and atraumatic.  Eyes: Conjunctivae are normal. Right eye exhibits no discharge.  Neck: Neck supple.  Cardiovascular: Normal rate, regular rhythm and normal heart sounds.   No murmur heard. Pulmonary/Chest: Effort normal and breath sounds normal. She has no wheezes. She has no rales.  Abdominal: Soft. She exhibits no distension. There is no tenderness.  Musculoskeletal: Normal range of motion. She exhibits no edema.  Neurological: She is oriented to person, place, and time. No cranial nerve deficit.  Skin: Skin is warm and dry. No rash noted. She is not diaphoretic.  Psychiatric: She has a normal mood and affect.  Nursing note and vitals reviewed.   ED Course  Procedures (including critical care time) Labs Review Labs Reviewed  URINE CULTURE  CBC WITH DIFFERENTIAL/PLATELET  BASIC METABOLIC PANEL  URINALYSIS, ROUTINE W REFLEX MICROSCOPIC (NOT AT [facility])  I-STAT TROPOININ, ED    Imaging Review No results found. I have personally reviewed and evaluated these images and lab results as part of my medical decision-making.   EKG Interpretation   Date/Time:  Thursday August 02 2015 15:38:59 EDT Ventricular Rate:  77 PR Interval:    QRS Duration: 80 QT Interval:  368 QTC Calculation: 416 R Axis:   94 Text Interpretation:  Atrial fibrillation Right axis deviation Consider  left ventricular hypertrophy Repol abnrm suggests ischemia, diffuse leads  inverted T laterally,  diffferent than last ekg, similar to ekg in 2000  Confirmed by Sheldon, COURTNEY ([id number]) on 08/02/2015 4:35:23 PM      MDM   Final diagnoses:  None    Patient is a 81 year old female with extensive cardiac history presenting today with chest pain. Patient is demented and therefore hard to get history from. She has normal vital signs and his physical exam. She appears in no acute distress. The chest pain did not occur during exertion.   Extensive conversation had with patient's daughter. Patient has very advanced dementia and does much better at home than she would in a new environment. We discussed that fact with the patietn, daughter and cardiologist. Cardiologist saw patient at bedside. We all agreed that a delta troponin would be a reasonable next step for this 81 year old female. If we were to admit her we would just do an additional troponin and likely no interventions. Cardiology recommends discharge home after delta troponin. She will return with any increased chest pain.  7:38 PM Patient ate a full meal. Troponin negative. We'll have her follow-up with her primary cardiologist Dr. Susa Day, MD 08/02/15 [phone]

## 2015-08-03 DIAGNOSIS — I509 Heart failure, unspecified: Secondary | ICD-10-CM | POA: Diagnosis not present

## 2015-08-10 DIAGNOSIS — I509 Heart failure, unspecified: Secondary | ICD-10-CM | POA: Diagnosis not present

## 2015-08-14 DIAGNOSIS — I509 Heart failure, unspecified: Secondary | ICD-10-CM | POA: Diagnosis not present

## 2015-08-16 ENCOUNTER — Telehealth: Payer: Self-pay | Admitting: Cardiology

## 2015-08-16 ENCOUNTER — Ambulatory Visit: Payer: Medicare Other | Admitting: Cardiology

## 2015-08-16 NOTE — Telephone Encounter (Signed)
Returned call to patient's daughter Christy Miranda she was calling to report she had to cancel mother's appointment today with Dr.Jordan due to mother's condition is worse.Stated she is more lethargic wants to sleep a lot.Hospice coming in to help.Advised to call back if needed.

## 2015-08-16 NOTE — Telephone Encounter (Signed)
Christy Miranda is wanting to speak with you [name].( Left a message) .Please call

## 2015-08-22 ENCOUNTER — Ambulatory Visit: Admitting: Podiatry

## 2015-08-23 DIAGNOSIS — I509 Heart failure, unspecified: Secondary | ICD-10-CM | POA: Diagnosis not present

## 2015-08-29 DIAGNOSIS — I509 Heart failure, unspecified: Secondary | ICD-10-CM | POA: Diagnosis not present

## 2015-08-30 ENCOUNTER — Ambulatory Visit: Payer: Self-pay | Admitting: Neurology

## 2015-08-30 DIAGNOSIS — I509 Heart failure, unspecified: Secondary | ICD-10-CM | POA: Diagnosis not present

## 2015-09-01 DIAGNOSIS — I509 Heart failure, unspecified: Secondary | ICD-10-CM | POA: Diagnosis not present

## 2015-09-03 DIAGNOSIS — I509 Heart failure, unspecified: Secondary | ICD-10-CM | POA: Diagnosis not present

## 2015-09-11 DIAGNOSIS — I509 Heart failure, unspecified: Secondary | ICD-10-CM | POA: Diagnosis not present

## 2015-09-13 DIAGNOSIS — I509 Heart failure, unspecified: Secondary | ICD-10-CM | POA: Diagnosis not present

## 2015-09-17 ENCOUNTER — Telehealth: Payer: Self-pay | Admitting: Cardiology

## 2015-09-17 ENCOUNTER — Ambulatory Visit (INDEPENDENT_AMBULATORY_CARE_PROVIDER_SITE_OTHER): Admitting: *Deleted

## 2015-09-17 DIAGNOSIS — I495 Sick sinus syndrome: Secondary | ICD-10-CM

## 2015-09-17 NOTE — Telephone Encounter (Signed)
LMOVM reminding pt to send remote transmission.   

## 2015-09-18 ENCOUNTER — Encounter: Payer: Self-pay | Admitting: Cardiology

## 2015-09-18 DIAGNOSIS — I509 Heart failure, unspecified: Secondary | ICD-10-CM | POA: Diagnosis not present

## 2015-09-20 NOTE — Progress Notes (Signed)
Remote pacemaker transmission.   

## 2015-09-24 ENCOUNTER — Encounter: Payer: Self-pay | Admitting: Cardiology

## 2015-09-24 LAB — CUP PACEART REMOTE DEVICE CHECK
Battery Voltage: 2.78 V
Date Time Interrogation Session: 20161018182042
Lead Channel Impedance Value: 0 Ohm
Lead Channel Impedance Value: 514 Ohm
Lead Channel Pacing Threshold Pulse Width: 0.4 ms
Lead Channel Sensing Intrinsic Amplitude: 5.6 mV
MDC IDC LEAD IMPLANT DT: 20010403
MDC IDC LEAD LOCATION: 753860
MDC IDC LEAD SERIAL: 204151
MDC IDC MSMT BATTERY IMPEDANCE: 450 Ohm
MDC IDC MSMT BATTERY REMAINING LONGEVITY: 93 mo
MDC IDC MSMT LEADCHNL RV PACING THRESHOLD AMPLITUDE: 1.125 V
MDC IDC SET LEADCHNL RV PACING AMPLITUDE: 2.5 V
MDC IDC SET LEADCHNL RV PACING PULSEWIDTH: 0.4 ms
MDC IDC SET LEADCHNL RV SENSING SENSITIVITY: 2 mV
MDC IDC STAT BRADY RV PERCENT PACED: 37 %

## 2015-09-26 DIAGNOSIS — I509 Heart failure, unspecified: Secondary | ICD-10-CM | POA: Diagnosis not present

## 2015-09-27 DIAGNOSIS — I509 Heart failure, unspecified: Secondary | ICD-10-CM | POA: Diagnosis not present

## 2015-10-02 DIAGNOSIS — I509 Heart failure, unspecified: Secondary | ICD-10-CM | POA: Diagnosis not present

## 2015-10-03 ENCOUNTER — Encounter: Payer: Self-pay | Admitting: Podiatry

## 2015-10-03 ENCOUNTER — Ambulatory Visit (INDEPENDENT_AMBULATORY_CARE_PROVIDER_SITE_OTHER): Payer: Medicare Other | Admitting: Podiatry

## 2015-10-03 DIAGNOSIS — M79676 Pain in unspecified toe(s): Secondary | ICD-10-CM | POA: Diagnosis not present

## 2015-10-03 DIAGNOSIS — B351 Tinea unguium: Secondary | ICD-10-CM

## 2015-10-03 NOTE — Progress Notes (Signed)
Patient ID: Christy Miranda, female   DOB: Apr 01, 1921, 81 y.o.   MRN: [id number]  Subjective: This patient presents today complaining of painful toenails and requests nail debridement. Patient's daughter is present treatment room says that her mother has had difficulty with keeping schedule visits and they requesting follow-up visit at the request  Objective: Patient appears pleasant and responsive to questioning No open skin lesions bilaterally Toenails 7 are brittle, discolored, hypertrophic and tender to direct palpation  Assessment: Symptomatic onychomycoses 6-10  Plan: Debrided toenails 10 mechanically and electrically without any bleeding  Reappoint at patient's request

## 2015-10-04 DIAGNOSIS — I509 Heart failure, unspecified: Secondary | ICD-10-CM | POA: Diagnosis not present

## 2015-10-09 DIAGNOSIS — I509 Heart failure, unspecified: Secondary | ICD-10-CM | POA: Diagnosis not present

## 2015-10-10 DIAGNOSIS — I509 Heart failure, unspecified: Secondary | ICD-10-CM | POA: Diagnosis not present

## 2015-10-17 DIAGNOSIS — I509 Heart failure, unspecified: Secondary | ICD-10-CM | POA: Diagnosis not present

## 2015-10-24 DIAGNOSIS — I509 Heart failure, unspecified: Secondary | ICD-10-CM | POA: Diagnosis not present

## 2015-10-28 DIAGNOSIS — I509 Heart failure, unspecified: Secondary | ICD-10-CM | POA: Diagnosis not present

## 2015-10-29 DIAGNOSIS — E559 Vitamin D deficiency, unspecified: Secondary | ICD-10-CM | POA: Diagnosis not present

## 2015-11-01 DIAGNOSIS — I509 Heart failure, unspecified: Secondary | ICD-10-CM | POA: Diagnosis not present

## 2015-11-09 DIAGNOSIS — I509 Heart failure, unspecified: Secondary | ICD-10-CM | POA: Diagnosis not present

## 2015-11-12 DIAGNOSIS — I509 Heart failure, unspecified: Secondary | ICD-10-CM | POA: Diagnosis not present

## 2015-11-14 DIAGNOSIS — I509 Heart failure, unspecified: Secondary | ICD-10-CM | POA: Diagnosis not present

## 2015-12-17 ENCOUNTER — Encounter: Payer: Medicare Other | Admitting: *Deleted

## 2015-12-17 ENCOUNTER — Telehealth: Payer: Self-pay | Admitting: Cardiology

## 2015-12-17 NOTE — Telephone Encounter (Signed)
LMOVM reminding pt to send remote transmission.   

## 2015-12-19 ENCOUNTER — Encounter: Payer: Self-pay | Admitting: Cardiology

## 2015-12-21 ENCOUNTER — Ambulatory Visit (INDEPENDENT_AMBULATORY_CARE_PROVIDER_SITE_OTHER): Payer: Medicare Other | Admitting: *Deleted

## 2015-12-21 DIAGNOSIS — I495 Sick sinus syndrome: Secondary | ICD-10-CM

## 2015-12-25 NOTE — Progress Notes (Signed)
Remote pacemaker transmission.   

## 2016-01-01 LAB — CUP PACEART REMOTE DEVICE CHECK
Brady Statistic RV Percent Paced: 37 %
Date Time Interrogation Session: 20170120202742
Lead Channel Impedance Value: 0 Ohm
Lead Channel Impedance Value: 522 Ohm
Lead Channel Setting Pacing Amplitude: 2.5 V
Lead Channel Setting Pacing Pulse Width: 0.4 ms
Lead Channel Setting Sensing Sensitivity: 2 mV
MDC IDC LEAD IMPLANT DT: 20010403
MDC IDC LEAD LOCATION: 753860
MDC IDC LEAD SERIAL: 204151
MDC IDC MSMT BATTERY IMPEDANCE: 499 Ohm
MDC IDC MSMT BATTERY REMAINING LONGEVITY: 89 mo
MDC IDC MSMT BATTERY VOLTAGE: 2.78 V

## 2016-01-02 ENCOUNTER — Encounter: Payer: Self-pay | Admitting: Cardiology

## 2016-01-09 DIAGNOSIS — J209 Acute bronchitis, unspecified: Secondary | ICD-10-CM | POA: Diagnosis not present

## 2016-01-09 DIAGNOSIS — R05 Cough: Secondary | ICD-10-CM | POA: Diagnosis not present

## 2016-01-16 ENCOUNTER — Ambulatory Visit (INDEPENDENT_AMBULATORY_CARE_PROVIDER_SITE_OTHER): Payer: Medicare Other | Admitting: Podiatry

## 2016-01-16 ENCOUNTER — Encounter: Payer: Self-pay | Admitting: Podiatry

## 2016-01-16 DIAGNOSIS — B351 Tinea unguium: Secondary | ICD-10-CM | POA: Diagnosis not present

## 2016-01-16 DIAGNOSIS — M79676 Pain in unspecified toe(s): Secondary | ICD-10-CM | POA: Diagnosis not present

## 2016-01-16 NOTE — Patient Instructions (Addendum)
Apply topical antibiotic ointment such as Triple Antibiotic ointment to the fifth right toenail bed daily and cover with a Band-Aid until discomfort ends Unsure as to the cause of generalized foot pain Use one  500 mg acetaminophen one hour before you encourage weightbearing

## 2016-01-17 NOTE — Progress Notes (Signed)
Patient ID: Christy Miranda, female   DOB: 1921-09-09, 82 y.o.   MRN: [id number]  Subjective: Today this patient presents for scheduled visit for the purpose of debrided using toenails are uncomfortable walking wearing shoes. Patient's daughter and caregiver are requesting evaluation today cause Christy Miranda is complaining of diffuse foot pain and does not want to walk. She ambulates in soft shoes. Patient is not able to articulate wear her foot pain has Patient's caregiver describes completing azithromycin  Dosepak in the past 5 days  Objective: Patient pleasant nonresponsive  Vascular: DP and PT pulses trace palpable bilaterally No open skin lesions bilaterally Fifth right toenail is partially detached from nailbed with crusting and mild erythema in the fifth right toe The toenails are elongated, brittle, deformed, discolored and tender to direct palpation  Patient tender to palpation diffusely dorsally and plantarly, bilaterally   Assessment: Undetermined cause of diffuse foot pain bilaterally Possible low-grade cellulitis fifth right toe? Patient has recently completed antibiotics which would also provide coverage for an additional 57 days Symptomatic onychomycoses 6-10  Plan: Today I recommended to the patient's daughter and caregiver to apply topical antibiotic ointment and Band-Aids and fifth right toe daily. I advised him to observe the fifth right toe to see if there is any increasing signs of redness, pain, warmth and present to office or emergency department if symptoms progressed  Also, I recommended  The give onr 500 mg acetaminophen when hour before encouraging patient to walk  Reappoint at three-month intervals for nail debridement or sooner if patient has a concern

## 2016-01-30 ENCOUNTER — Other Ambulatory Visit: Payer: Self-pay | Admitting: *Deleted

## 2016-01-30 MED ORDER — METOPROLOL TARTRATE 50 MG PO TABS: 50.0000 mg | ORAL_TABLET | Freq: Two times a day (BID) | ORAL | Status: DC

## 2016-01-31 ENCOUNTER — Other Ambulatory Visit: Payer: Self-pay | Admitting: *Deleted

## 2016-01-31 MED ORDER — METOPROLOL TARTRATE 50 MG PO TABS: 50.0000 mg | ORAL_TABLET | Freq: Two times a day (BID) | ORAL | Status: DC

## 2016-02-04 ENCOUNTER — Telehealth: Payer: Self-pay | Admitting: *Deleted

## 2016-02-04 MED ORDER — METOPROLOL TARTRATE 50 MG PO TABS: 50.0000 mg | ORAL_TABLET | Freq: Two times a day (BID) | ORAL | Status: DC

## 2016-02-04 NOTE — Telephone Encounter (Signed)
Refill sent.

## 2016-02-26 DIAGNOSIS — M4003 Postural kyphosis, cervicothoracic region: Secondary | ICD-10-CM | POA: Diagnosis not present

## 2016-02-26 DIAGNOSIS — I4891 Unspecified atrial fibrillation: Secondary | ICD-10-CM | POA: Diagnosis not present

## 2016-02-26 DIAGNOSIS — R4701 Aphasia: Secondary | ICD-10-CM | POA: Diagnosis not present

## 2016-02-26 DIAGNOSIS — L89301 Pressure ulcer of unspecified buttock, stage 1: Secondary | ICD-10-CM | POA: Diagnosis not present

## 2016-02-26 DIAGNOSIS — I69320 Aphasia following cerebral infarction: Secondary | ICD-10-CM | POA: Diagnosis not present

## 2016-03-06 DIAGNOSIS — I679 Cerebrovascular disease, unspecified: Secondary | ICD-10-CM | POA: Diagnosis not present

## 2016-03-10 DIAGNOSIS — I679 Cerebrovascular disease, unspecified: Secondary | ICD-10-CM | POA: Diagnosis not present

## 2016-03-14 DIAGNOSIS — I679 Cerebrovascular disease, unspecified: Secondary | ICD-10-CM | POA: Diagnosis not present

## 2016-03-18 DIAGNOSIS — I679 Cerebrovascular disease, unspecified: Secondary | ICD-10-CM | POA: Diagnosis not present

## 2016-03-24 DIAGNOSIS — I679 Cerebrovascular disease, unspecified: Secondary | ICD-10-CM | POA: Diagnosis not present

## 2016-03-27 DIAGNOSIS — I679 Cerebrovascular disease, unspecified: Secondary | ICD-10-CM | POA: Diagnosis not present

## 2016-03-31 DIAGNOSIS — I10 Essential (primary) hypertension: Secondary | ICD-10-CM | POA: Diagnosis not present

## 2016-03-31 DIAGNOSIS — I679 Cerebrovascular disease, unspecified: Secondary | ICD-10-CM | POA: Diagnosis not present

## 2016-03-31 DIAGNOSIS — I69320 Aphasia following cerebral infarction: Secondary | ICD-10-CM | POA: Diagnosis not present

## 2016-03-31 DIAGNOSIS — I4891 Unspecified atrial fibrillation: Secondary | ICD-10-CM | POA: Diagnosis not present

## 2016-03-31 DIAGNOSIS — I509 Heart failure, unspecified: Secondary | ICD-10-CM | POA: Diagnosis not present

## 2016-04-01 DIAGNOSIS — I10 Essential (primary) hypertension: Secondary | ICD-10-CM | POA: Diagnosis not present

## 2016-04-01 DIAGNOSIS — I4891 Unspecified atrial fibrillation: Secondary | ICD-10-CM | POA: Diagnosis not present

## 2016-04-01 DIAGNOSIS — I69320 Aphasia following cerebral infarction: Secondary | ICD-10-CM | POA: Diagnosis not present

## 2016-04-01 DIAGNOSIS — I509 Heart failure, unspecified: Secondary | ICD-10-CM | POA: Diagnosis not present

## 2016-04-01 DIAGNOSIS — I679 Cerebrovascular disease, unspecified: Secondary | ICD-10-CM | POA: Diagnosis not present

## 2016-04-08 DIAGNOSIS — I69320 Aphasia following cerebral infarction: Secondary | ICD-10-CM | POA: Diagnosis not present

## 2016-04-08 DIAGNOSIS — I4891 Unspecified atrial fibrillation: Secondary | ICD-10-CM | POA: Diagnosis not present

## 2016-04-08 DIAGNOSIS — I10 Essential (primary) hypertension: Secondary | ICD-10-CM | POA: Diagnosis not present

## 2016-04-08 DIAGNOSIS — I679 Cerebrovascular disease, unspecified: Secondary | ICD-10-CM | POA: Diagnosis not present

## 2016-04-08 DIAGNOSIS — I509 Heart failure, unspecified: Secondary | ICD-10-CM | POA: Diagnosis not present

## 2016-04-11 DIAGNOSIS — I4891 Unspecified atrial fibrillation: Secondary | ICD-10-CM | POA: Diagnosis not present

## 2016-04-11 DIAGNOSIS — I10 Essential (primary) hypertension: Secondary | ICD-10-CM | POA: Diagnosis not present

## 2016-04-11 DIAGNOSIS — I679 Cerebrovascular disease, unspecified: Secondary | ICD-10-CM | POA: Diagnosis not present

## 2016-04-11 DIAGNOSIS — I69320 Aphasia following cerebral infarction: Secondary | ICD-10-CM | POA: Diagnosis not present

## 2016-04-11 DIAGNOSIS — I509 Heart failure, unspecified: Secondary | ICD-10-CM | POA: Diagnosis not present

## 2016-04-15 DIAGNOSIS — I10 Essential (primary) hypertension: Secondary | ICD-10-CM | POA: Diagnosis not present

## 2016-04-15 DIAGNOSIS — I679 Cerebrovascular disease, unspecified: Secondary | ICD-10-CM | POA: Diagnosis not present

## 2016-04-15 DIAGNOSIS — I509 Heart failure, unspecified: Secondary | ICD-10-CM | POA: Diagnosis not present

## 2016-04-15 DIAGNOSIS — I4891 Unspecified atrial fibrillation: Secondary | ICD-10-CM | POA: Diagnosis not present

## 2016-04-15 DIAGNOSIS — I69320 Aphasia following cerebral infarction: Secondary | ICD-10-CM | POA: Diagnosis not present

## 2016-04-18 DIAGNOSIS — I4891 Unspecified atrial fibrillation: Secondary | ICD-10-CM | POA: Diagnosis not present

## 2016-04-18 DIAGNOSIS — I679 Cerebrovascular disease, unspecified: Secondary | ICD-10-CM | POA: Diagnosis not present

## 2016-04-18 DIAGNOSIS — I10 Essential (primary) hypertension: Secondary | ICD-10-CM | POA: Diagnosis not present

## 2016-04-18 DIAGNOSIS — I69320 Aphasia following cerebral infarction: Secondary | ICD-10-CM | POA: Diagnosis not present

## 2016-04-18 DIAGNOSIS — I509 Heart failure, unspecified: Secondary | ICD-10-CM | POA: Diagnosis not present

## 2016-04-23 ENCOUNTER — Ambulatory Visit: Payer: Medicare Other | Admitting: Podiatry

## 2016-04-24 DIAGNOSIS — I10 Essential (primary) hypertension: Secondary | ICD-10-CM | POA: Diagnosis not present

## 2016-04-24 DIAGNOSIS — I69320 Aphasia following cerebral infarction: Secondary | ICD-10-CM | POA: Diagnosis not present

## 2016-04-24 DIAGNOSIS — I679 Cerebrovascular disease, unspecified: Secondary | ICD-10-CM | POA: Diagnosis not present

## 2016-04-24 DIAGNOSIS — I4891 Unspecified atrial fibrillation: Secondary | ICD-10-CM | POA: Diagnosis not present

## 2016-04-24 DIAGNOSIS — I509 Heart failure, unspecified: Secondary | ICD-10-CM | POA: Diagnosis not present

## 2016-04-30 ENCOUNTER — Encounter: Payer: Self-pay | Admitting: Podiatry

## 2016-04-30 ENCOUNTER — Ambulatory Visit (INDEPENDENT_AMBULATORY_CARE_PROVIDER_SITE_OTHER): Payer: Medicare Other | Admitting: Podiatry

## 2016-04-30 DIAGNOSIS — I10 Essential (primary) hypertension: Secondary | ICD-10-CM | POA: Diagnosis not present

## 2016-04-30 DIAGNOSIS — M79676 Pain in unspecified toe(s): Secondary | ICD-10-CM | POA: Diagnosis not present

## 2016-04-30 DIAGNOSIS — B351 Tinea unguium: Secondary | ICD-10-CM | POA: Diagnosis not present

## 2016-04-30 DIAGNOSIS — I4891 Unspecified atrial fibrillation: Secondary | ICD-10-CM | POA: Diagnosis not present

## 2016-04-30 DIAGNOSIS — I509 Heart failure, unspecified: Secondary | ICD-10-CM | POA: Diagnosis not present

## 2016-04-30 DIAGNOSIS — I679 Cerebrovascular disease, unspecified: Secondary | ICD-10-CM | POA: Diagnosis not present

## 2016-04-30 DIAGNOSIS — I69320 Aphasia following cerebral infarction: Secondary | ICD-10-CM | POA: Diagnosis not present

## 2016-05-01 DIAGNOSIS — I10 Essential (primary) hypertension: Secondary | ICD-10-CM | POA: Diagnosis not present

## 2016-05-01 DIAGNOSIS — I4891 Unspecified atrial fibrillation: Secondary | ICD-10-CM | POA: Diagnosis not present

## 2016-05-01 DIAGNOSIS — I69320 Aphasia following cerebral infarction: Secondary | ICD-10-CM | POA: Diagnosis not present

## 2016-05-01 DIAGNOSIS — I509 Heart failure, unspecified: Secondary | ICD-10-CM | POA: Diagnosis not present

## 2016-05-01 DIAGNOSIS — I679 Cerebrovascular disease, unspecified: Secondary | ICD-10-CM | POA: Diagnosis not present

## 2016-05-01 NOTE — Progress Notes (Signed)
Patient ID: Christy Miranda, female   DOB: 1921/02/19, 82 y.o.   MRN: [id number]   Subjective: This patient presents again with daughter and caregiver present in treatment room. They're requesting debridement of patient's toenails. Patient has a history of diffuse foot pain and has developed difficulty locating the exact areas of pain  Objective: With help of patient's daughter patient can respond. Patient when questioned whites with her fingers to the areas of pain. The lower legs and medial ankle area, bilaterally. With manual palpation of the lower leg and ankle there are no palpable palpable lesions, however, patient is reactive to manual motor pressure in her lower legs and ankles bilaterally  Vascular: DP and PT pulses trace palpable No open skin lesions bilaterally No erythema, edema, warmth noted bilaterally No calf edema bilaterally General reactive tenderness to palpation pretibial areas and ankles bilaterally without any palpable lesions The toenails are brittle, deformed without any surrounding erythema edema or active drainage  Assessment: Diminished pedal pulses suggestive proximal peripheral arterial disease Undetermined cause of diffuse leg and foot pain Mycotic toenails with symptoms  Plan: Today I offered patient daughter a vascular referral for a lower extremity arterial Doppler because of diminished pedal pulses. Patient's daughter declined this referral to the vascular lab I suggested BenGay type rub to see if this would reduce some of these diffuse symptoms in her legs and feet Debridement of toenails 6-10 mechanically and legs without any bleeding  Reappoint 3 months

## 2016-05-05 DIAGNOSIS — I679 Cerebrovascular disease, unspecified: Secondary | ICD-10-CM | POA: Diagnosis not present

## 2016-05-05 DIAGNOSIS — I4891 Unspecified atrial fibrillation: Secondary | ICD-10-CM | POA: Diagnosis not present

## 2016-05-05 DIAGNOSIS — I509 Heart failure, unspecified: Secondary | ICD-10-CM | POA: Diagnosis not present

## 2016-05-05 DIAGNOSIS — I69320 Aphasia following cerebral infarction: Secondary | ICD-10-CM | POA: Diagnosis not present

## 2016-05-05 DIAGNOSIS — I10 Essential (primary) hypertension: Secondary | ICD-10-CM | POA: Diagnosis not present

## 2016-05-07 ENCOUNTER — Encounter: Payer: Medicare Other | Admitting: Nurse Practitioner

## 2016-05-07 DIAGNOSIS — I4891 Unspecified atrial fibrillation: Secondary | ICD-10-CM | POA: Diagnosis not present

## 2016-05-07 DIAGNOSIS — I679 Cerebrovascular disease, unspecified: Secondary | ICD-10-CM | POA: Diagnosis not present

## 2016-05-07 DIAGNOSIS — I69320 Aphasia following cerebral infarction: Secondary | ICD-10-CM | POA: Diagnosis not present

## 2016-05-07 DIAGNOSIS — I10 Essential (primary) hypertension: Secondary | ICD-10-CM | POA: Diagnosis not present

## 2016-05-07 DIAGNOSIS — I509 Heart failure, unspecified: Secondary | ICD-10-CM | POA: Diagnosis not present

## 2016-05-09 DIAGNOSIS — I4891 Unspecified atrial fibrillation: Secondary | ICD-10-CM | POA: Diagnosis not present

## 2016-05-09 DIAGNOSIS — I509 Heart failure, unspecified: Secondary | ICD-10-CM | POA: Diagnosis not present

## 2016-05-09 DIAGNOSIS — I679 Cerebrovascular disease, unspecified: Secondary | ICD-10-CM | POA: Diagnosis not present

## 2016-05-09 DIAGNOSIS — I69320 Aphasia following cerebral infarction: Secondary | ICD-10-CM | POA: Diagnosis not present

## 2016-05-09 DIAGNOSIS — I10 Essential (primary) hypertension: Secondary | ICD-10-CM | POA: Diagnosis not present

## 2016-05-15 DIAGNOSIS — I509 Heart failure, unspecified: Secondary | ICD-10-CM | POA: Diagnosis not present

## 2016-05-15 DIAGNOSIS — I10 Essential (primary) hypertension: Secondary | ICD-10-CM | POA: Diagnosis not present

## 2016-05-15 DIAGNOSIS — I679 Cerebrovascular disease, unspecified: Secondary | ICD-10-CM | POA: Diagnosis not present

## 2016-05-15 DIAGNOSIS — I4891 Unspecified atrial fibrillation: Secondary | ICD-10-CM | POA: Diagnosis not present

## 2016-05-15 DIAGNOSIS — I69320 Aphasia following cerebral infarction: Secondary | ICD-10-CM | POA: Diagnosis not present

## 2016-05-16 DIAGNOSIS — I10 Essential (primary) hypertension: Secondary | ICD-10-CM | POA: Diagnosis not present

## 2016-05-16 DIAGNOSIS — I69320 Aphasia following cerebral infarction: Secondary | ICD-10-CM | POA: Diagnosis not present

## 2016-05-16 DIAGNOSIS — I509 Heart failure, unspecified: Secondary | ICD-10-CM | POA: Diagnosis not present

## 2016-05-16 DIAGNOSIS — I4891 Unspecified atrial fibrillation: Secondary | ICD-10-CM | POA: Diagnosis not present

## 2016-05-16 DIAGNOSIS — I679 Cerebrovascular disease, unspecified: Secondary | ICD-10-CM | POA: Diagnosis not present

## 2016-05-20 DIAGNOSIS — I679 Cerebrovascular disease, unspecified: Secondary | ICD-10-CM | POA: Diagnosis not present

## 2016-05-20 DIAGNOSIS — I509 Heart failure, unspecified: Secondary | ICD-10-CM | POA: Diagnosis not present

## 2016-05-20 DIAGNOSIS — I69320 Aphasia following cerebral infarction: Secondary | ICD-10-CM | POA: Diagnosis not present

## 2016-05-20 DIAGNOSIS — I10 Essential (primary) hypertension: Secondary | ICD-10-CM | POA: Diagnosis not present

## 2016-05-20 DIAGNOSIS — I4891 Unspecified atrial fibrillation: Secondary | ICD-10-CM | POA: Diagnosis not present

## 2016-05-20 IMAGING — CT CT HEAD W/O CM
2 series · 15 of 30 positions shown, 19 images · non-contrast
Comparison: 05/21/2014

CLINICAL DATA: Code stroke, right-sided weakness

EXAM:
CT HEAD WITHOUT CONTRAST
TECHNIQUE: Contiguous axial images were obtained from the base of the skull
through the vertex without intravenous contrast.

[Series 201: head w/o, idose (1) · axial · non-contrast · 0.49mm/px · z∈[+74,+194]mm · 13 of 30 slices shown, 17 images]
[im 3/30  brain]
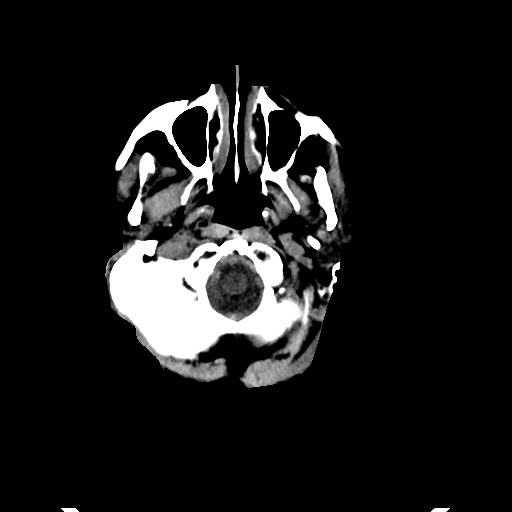
[im 3/30  bone]
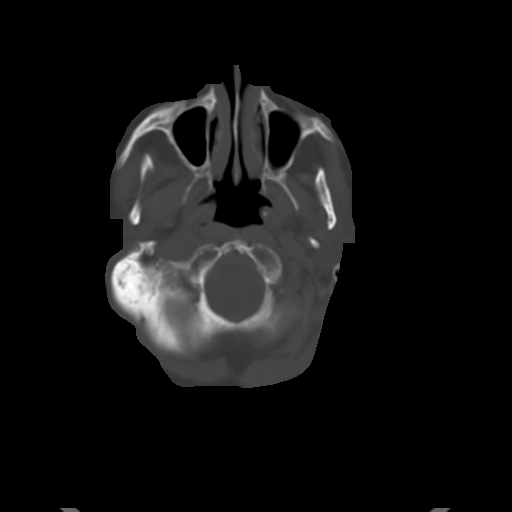
[im 5/30  brain]
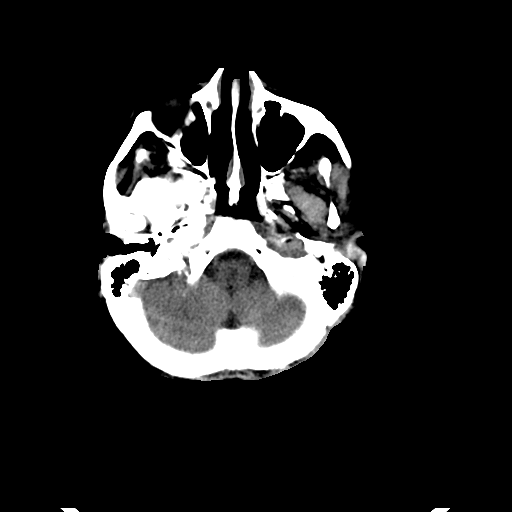
[im 7/30  brain]
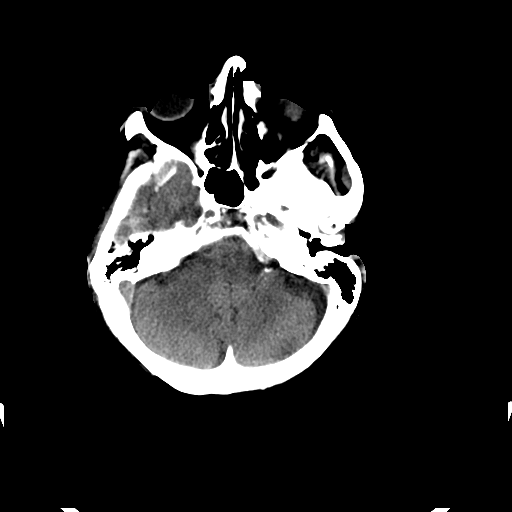
[im 9/30  brain]
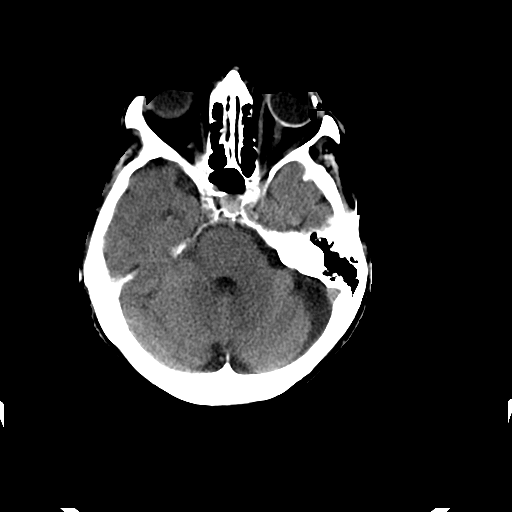
[im 11/30  brain]
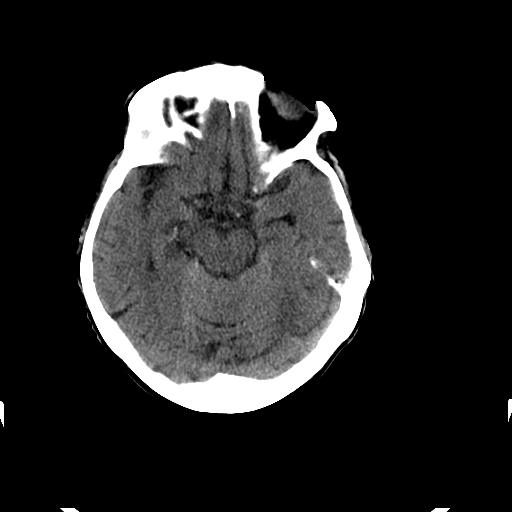
[im 11/30  bone]
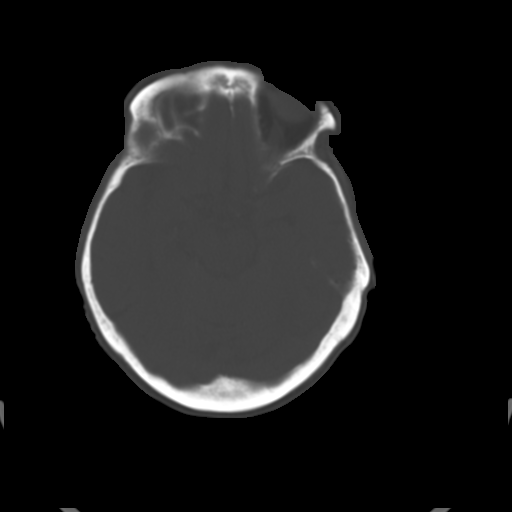
[im 13/30  brain]
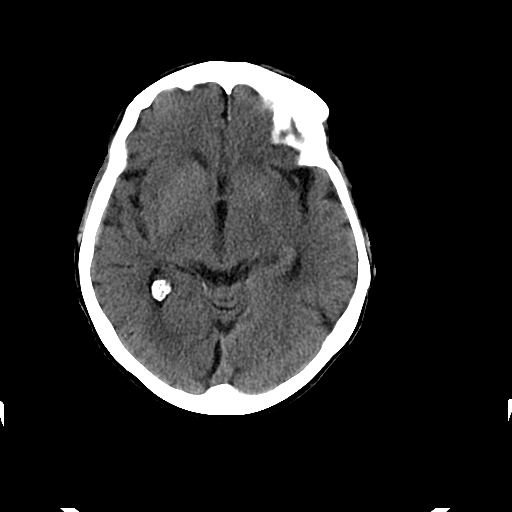
[im 15/30  brain]
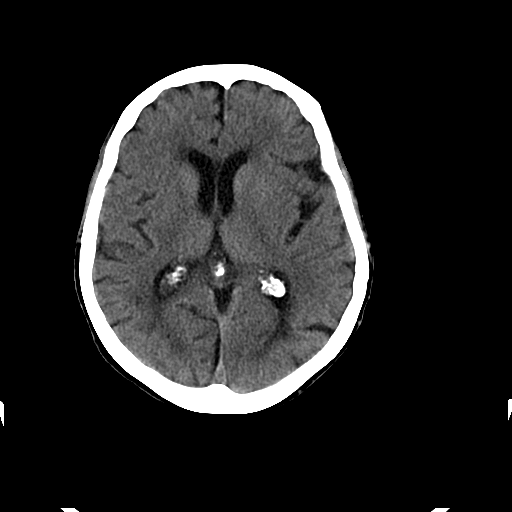
[im 17/30  brain]
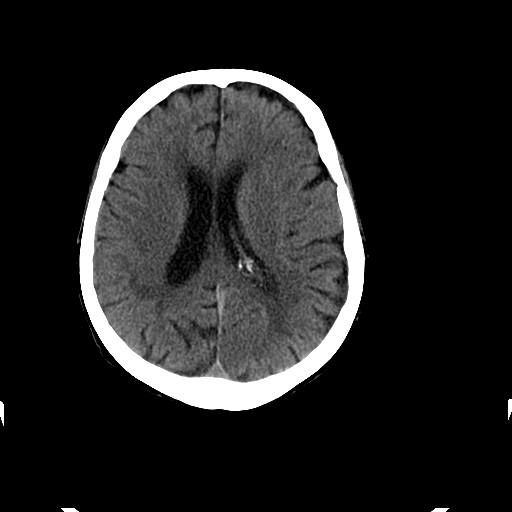
[im 19/30  brain]
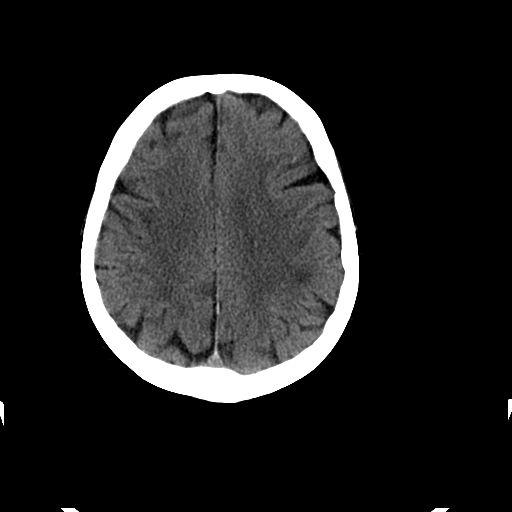
[im 19/30  bone]
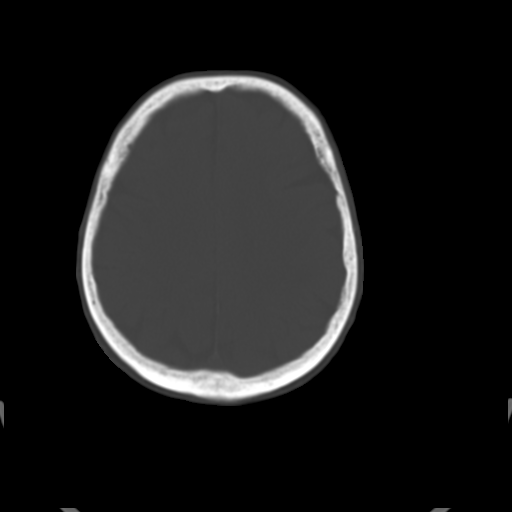
[im 21/30  brain]
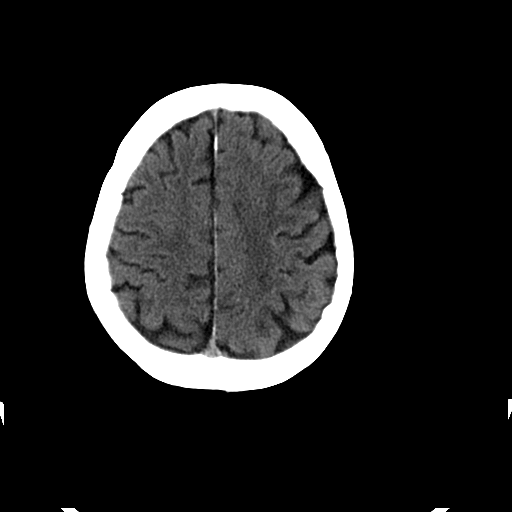
[im 23/30  brain]
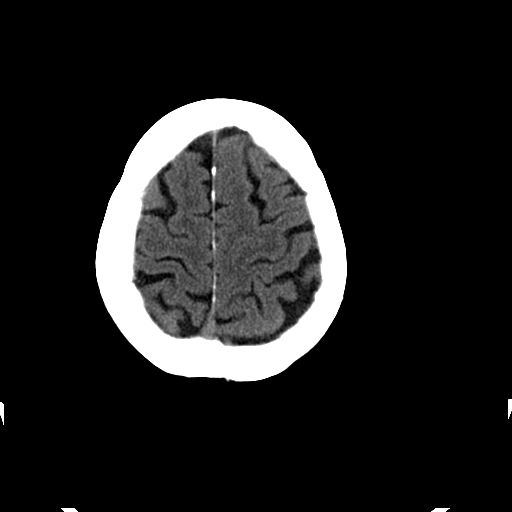
[im 25/30  brain]
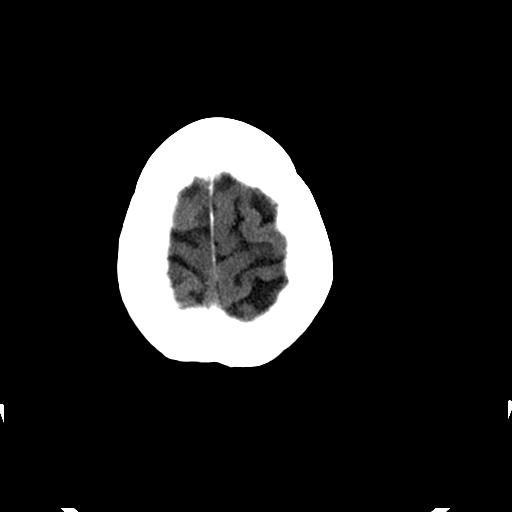
[im 27/30  brain]
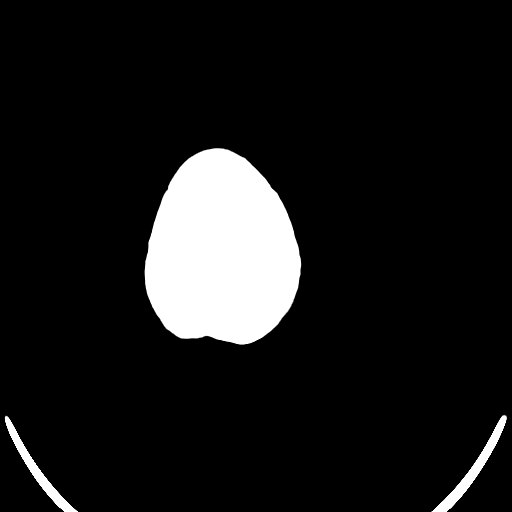
[im 27/30  bone]
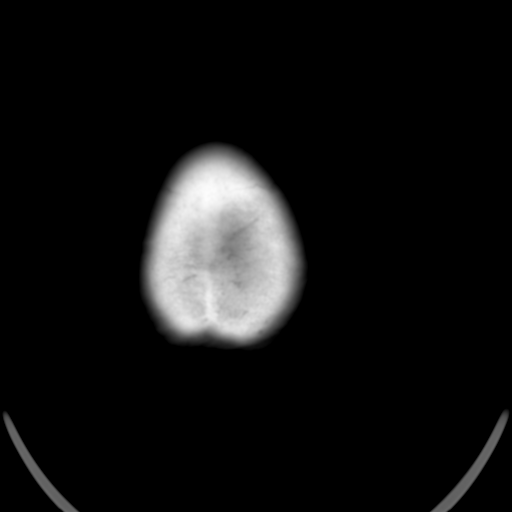

[Series 202: head w/o bone, idose (1) · axial · non-contrast · 0.49mm/px · z∈[+74,+94]mm · 2 of 30 slices shown]
[im 3/30  bone]
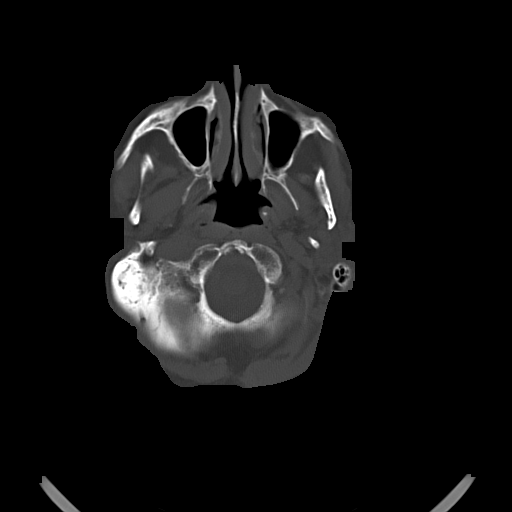
[im 7/30  bone]
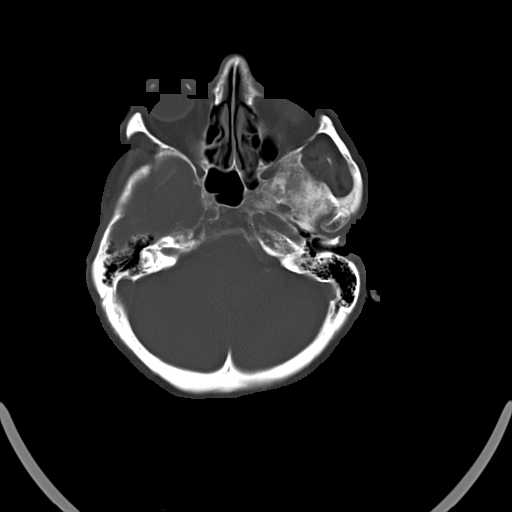

[15 of 30 positions shown; findings below may reference images not displayed]

FINDINGS: There is no evidence of mass effect, midline shift, or extra-axial
fluid collections. There is no evidence of a space-occupying lesion
or intracranial hemorrhage. There is no evidence of a cortical-based
area of acute infarction. There is generalized cerebral atrophy.
There is periventricular white matter low attenuation likely
secondary to microangiopathy.

The ventricles and sulci are appropriate for the patient's age. The
basal cisterns are patent.

Visualized portions of the orbits are unremarkable. The visualized
portions of the paranasal sinuses and mastoid air cells are
unremarkable. Cerebrovascular atherosclerotic calcifications are
noted.

The osseous structures are unremarkable.
IMPRESSION: No acute intracranial pathology. These results were called by
telephone at the time of interpretation on 06/13/2014 at [DATE] to
Dr. DR.LAXMI ROWLAND, who verbally acknowledged these results.

## 2016-05-21 IMAGING — CT CT HEAD W/O CM
1 of 2 series · 15 of 30 positions shown, 19 images · non-contrast
Comparison: 06/13/2014

CLINICAL DATA: Stroke.  TPA.

EXAM:
CT HEAD WITHOUT CONTRAST
TECHNIQUE: Contiguous axial images were obtained from the base of the skull
through the vertex without intravenous contrast.

[Series 4: head 2.0 h70h · axial · 0.39mm/px · z∈[+1062,+1208]mm · 15 of 83 slices shown, 19 images]
[im 5/83  brain]
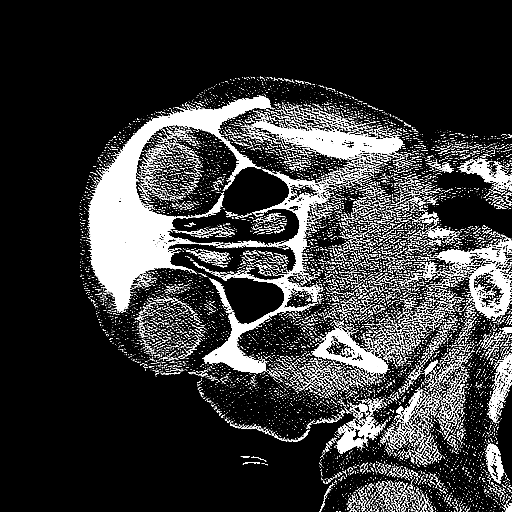
[im 5/83  bone]
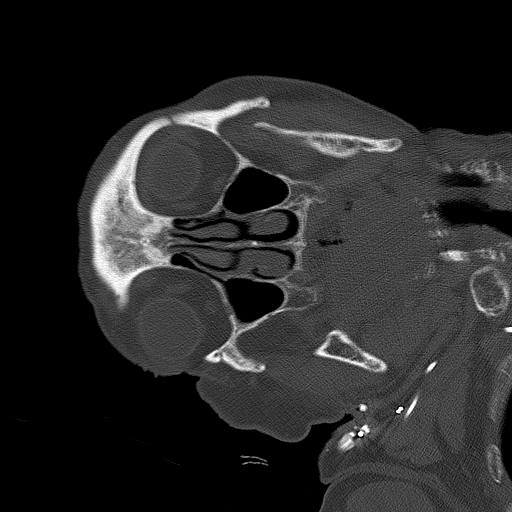
[im 9/83  brain]
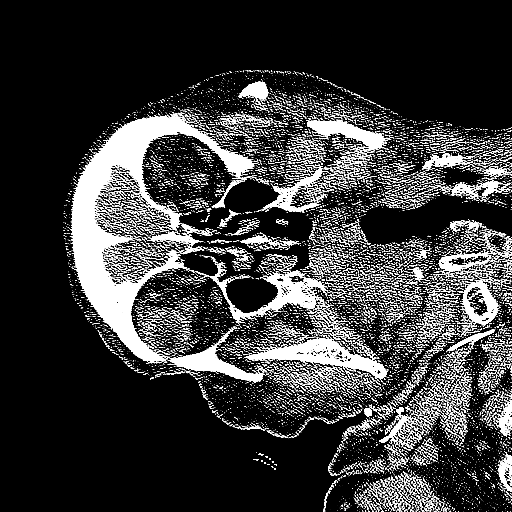
[im 17/83  brain]
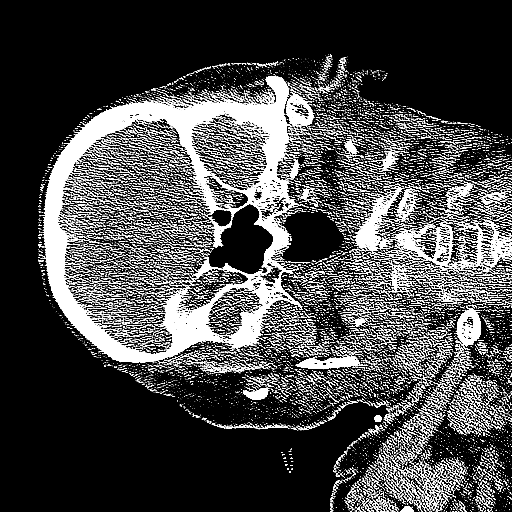
[im 21/83  brain]
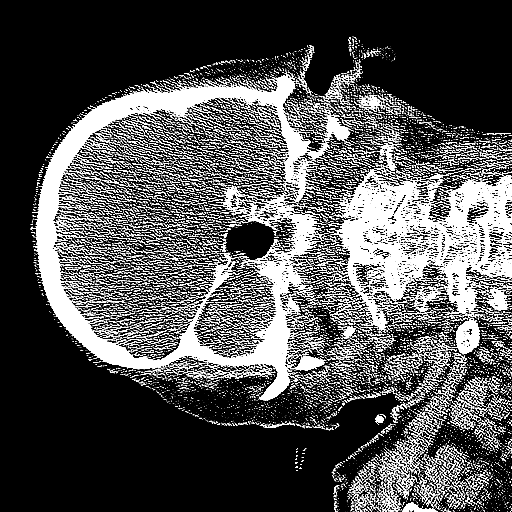
[im 25/83  brain]
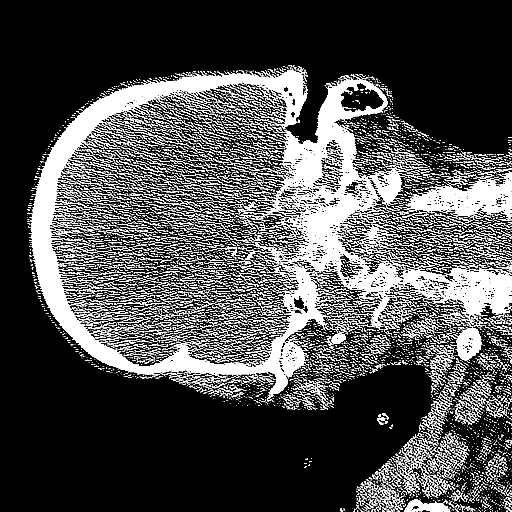
[im 25/83  bone]
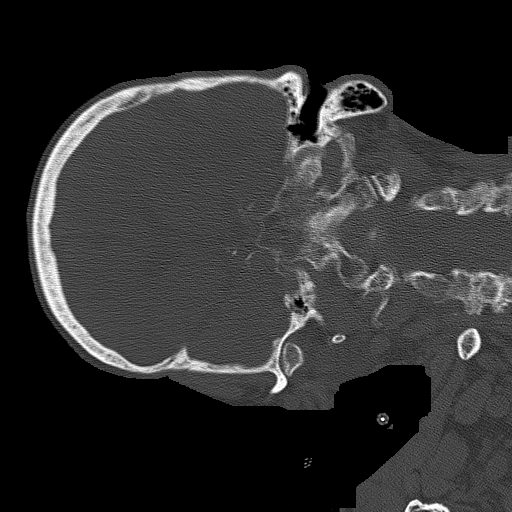
[im 29/83  brain]
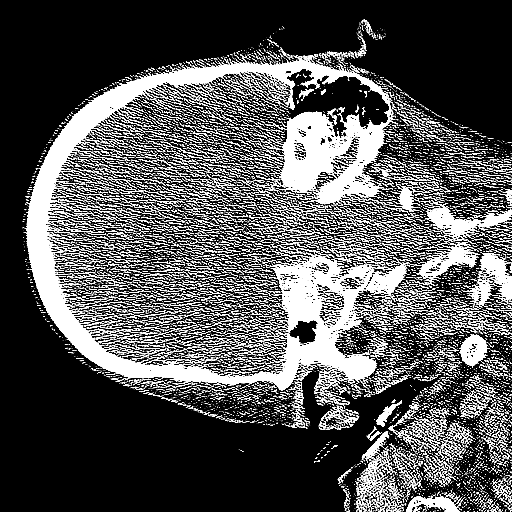
[im 37/83  brain]
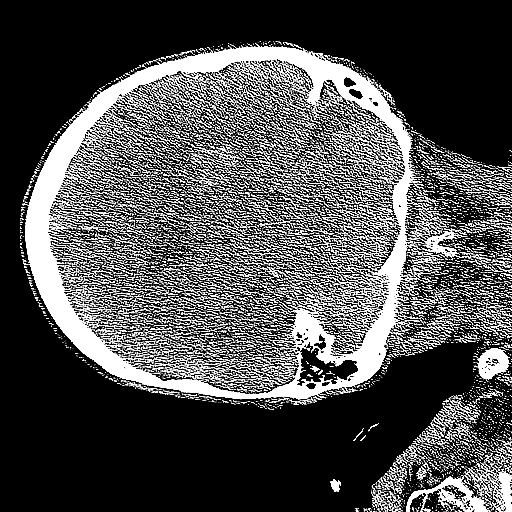
[im 42/83  brain]
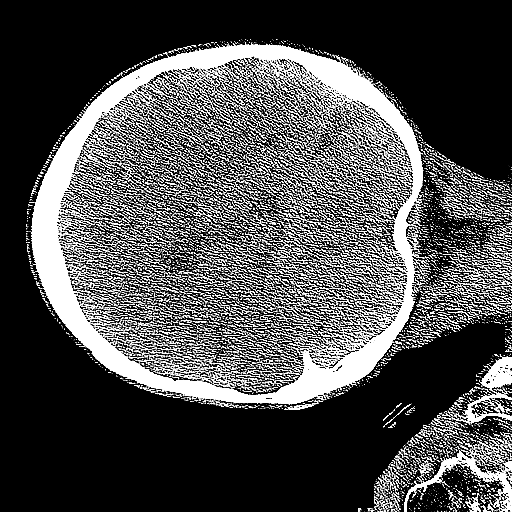
[im 46/83  brain]
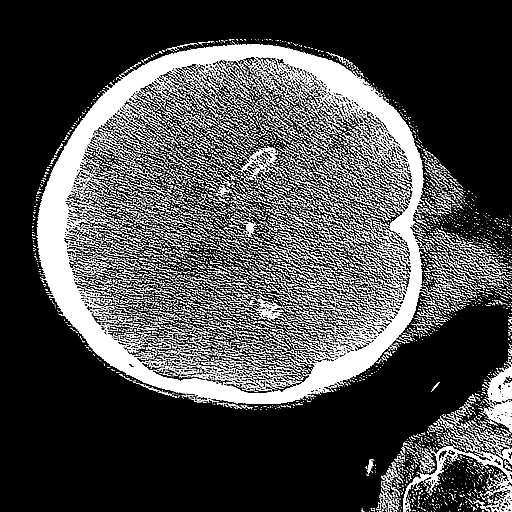
[im 46/83  bone]
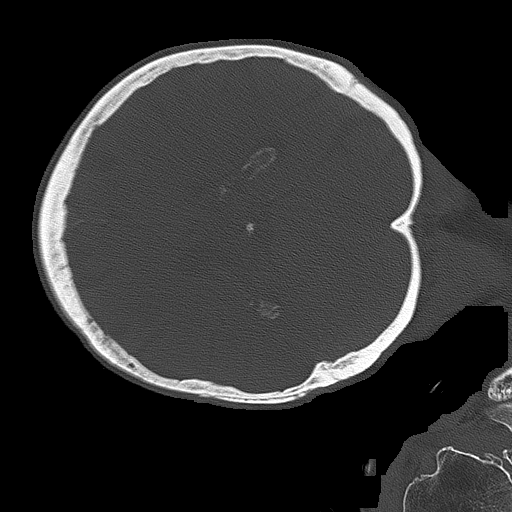
[im 54/83  brain]
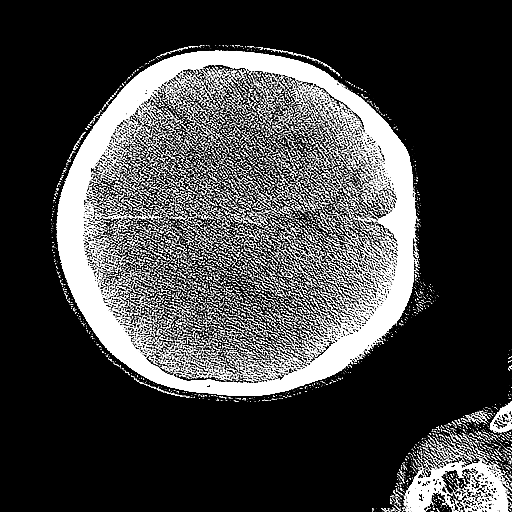
[im 58/83  brain]
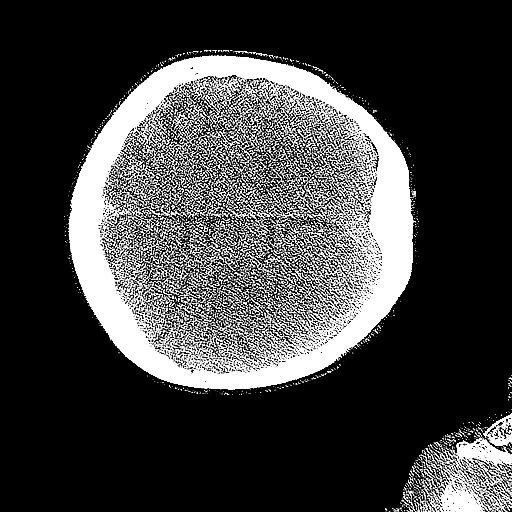
[im 62/83  brain]
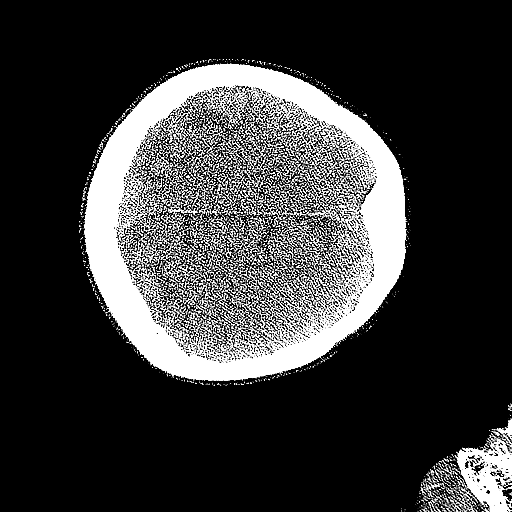
[im 66/83  brain]
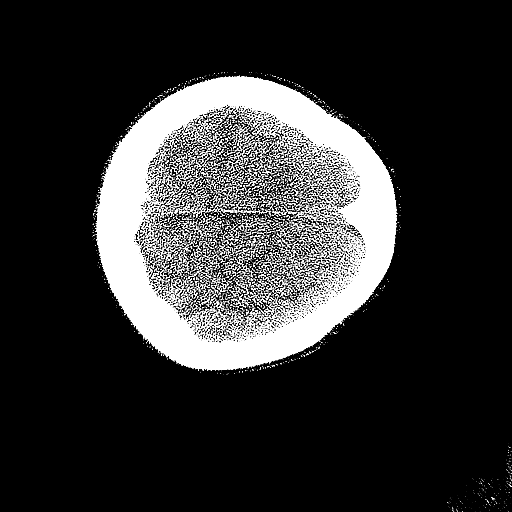
[im 66/83  bone]
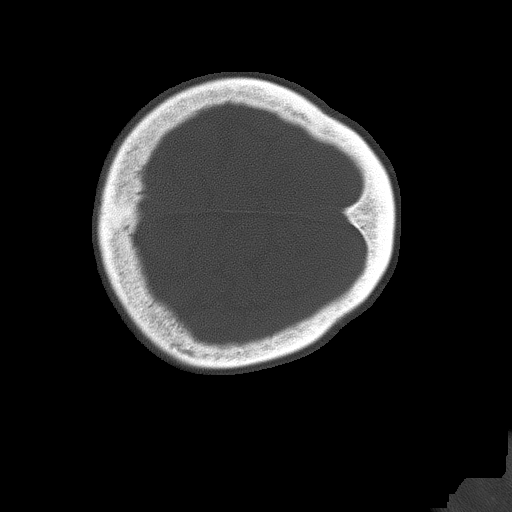
[im 74/83  brain]
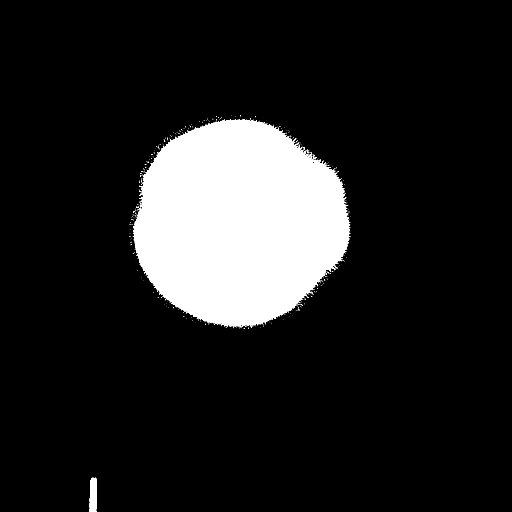
[im 78/83  brain]
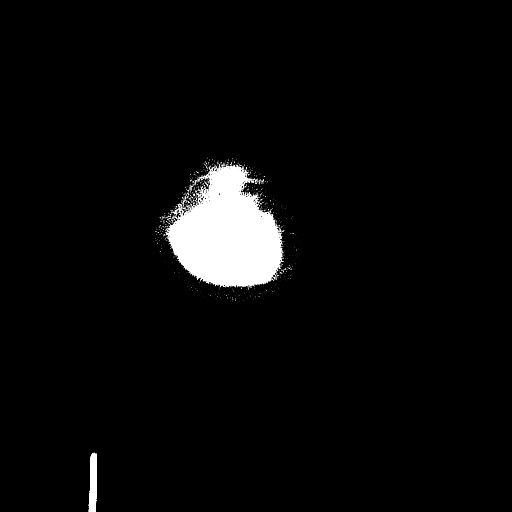

[15 of 30 positions shown; findings below may reference images not displayed]

FINDINGS: There is a new 3.6 x 3.3 cm acute parenchymal hemorrhage centered in
the left lentiform nucleus. There is mild surrounding edema with
mild mass effect on the left lateral ventricle and 5 mm of rightward
midline shift. Patchy hypodensities in the subcortical and deep
cerebral white matter are compatible with mild chronic small vessel
ischemic disease with a more focal hypoattenuation in the posterior
left parietal lobe possibly reflecting evolving acute infarct. There
is no extra-axial fluid collection. Cerebral volume is within normal
limits for age. Visualized mastoid air cells and paranasal sinuses
are clear.
IMPRESSION: New 3.6 cm left basal ganglia parenchymal hemorrhage, consistent
with hemorrhagic transformation of an acute infarct. 5 mm rightward
midline shift.

Critical Value/emergent results were called by telephone at the time
of interpretation on 06/14/2014 at [DATE] to Dr. Tiger, who
verbally acknowledged these results.

## 2016-05-23 DIAGNOSIS — I4891 Unspecified atrial fibrillation: Secondary | ICD-10-CM | POA: Diagnosis not present

## 2016-05-23 DIAGNOSIS — I69320 Aphasia following cerebral infarction: Secondary | ICD-10-CM | POA: Diagnosis not present

## 2016-05-23 DIAGNOSIS — I10 Essential (primary) hypertension: Secondary | ICD-10-CM | POA: Diagnosis not present

## 2016-05-23 DIAGNOSIS — I679 Cerebrovascular disease, unspecified: Secondary | ICD-10-CM | POA: Diagnosis not present

## 2016-05-23 DIAGNOSIS — I509 Heart failure, unspecified: Secondary | ICD-10-CM | POA: Diagnosis not present

## 2016-05-28 DIAGNOSIS — I4891 Unspecified atrial fibrillation: Secondary | ICD-10-CM | POA: Diagnosis not present

## 2016-05-28 DIAGNOSIS — I679 Cerebrovascular disease, unspecified: Secondary | ICD-10-CM | POA: Diagnosis not present

## 2016-05-28 DIAGNOSIS — I10 Essential (primary) hypertension: Secondary | ICD-10-CM | POA: Diagnosis not present

## 2016-05-28 DIAGNOSIS — I69320 Aphasia following cerebral infarction: Secondary | ICD-10-CM | POA: Diagnosis not present

## 2016-05-28 DIAGNOSIS — I509 Heart failure, unspecified: Secondary | ICD-10-CM | POA: Diagnosis not present

## 2016-05-31 DIAGNOSIS — I4891 Unspecified atrial fibrillation: Secondary | ICD-10-CM | POA: Diagnosis not present

## 2016-05-31 DIAGNOSIS — I69320 Aphasia following cerebral infarction: Secondary | ICD-10-CM | POA: Diagnosis not present

## 2016-05-31 DIAGNOSIS — I10 Essential (primary) hypertension: Secondary | ICD-10-CM | POA: Diagnosis not present

## 2016-05-31 DIAGNOSIS — I509 Heart failure, unspecified: Secondary | ICD-10-CM | POA: Diagnosis not present

## 2016-05-31 DIAGNOSIS — I679 Cerebrovascular disease, unspecified: Secondary | ICD-10-CM | POA: Diagnosis not present

## 2016-06-01 DIAGNOSIS — I4891 Unspecified atrial fibrillation: Secondary | ICD-10-CM | POA: Diagnosis not present

## 2016-06-01 DIAGNOSIS — I10 Essential (primary) hypertension: Secondary | ICD-10-CM | POA: Diagnosis not present

## 2016-06-01 DIAGNOSIS — I69320 Aphasia following cerebral infarction: Secondary | ICD-10-CM | POA: Diagnosis not present

## 2016-06-01 DIAGNOSIS — I509 Heart failure, unspecified: Secondary | ICD-10-CM | POA: Diagnosis not present

## 2016-06-01 DIAGNOSIS — I679 Cerebrovascular disease, unspecified: Secondary | ICD-10-CM | POA: Diagnosis not present

## 2016-06-04 DIAGNOSIS — I509 Heart failure, unspecified: Secondary | ICD-10-CM | POA: Diagnosis not present

## 2016-06-04 DIAGNOSIS — I10 Essential (primary) hypertension: Secondary | ICD-10-CM | POA: Diagnosis not present

## 2016-06-04 DIAGNOSIS — I679 Cerebrovascular disease, unspecified: Secondary | ICD-10-CM | POA: Diagnosis not present

## 2016-06-04 DIAGNOSIS — I69320 Aphasia following cerebral infarction: Secondary | ICD-10-CM | POA: Diagnosis not present

## 2016-06-04 DIAGNOSIS — I4891 Unspecified atrial fibrillation: Secondary | ICD-10-CM | POA: Diagnosis not present

## 2016-06-11 DIAGNOSIS — I69320 Aphasia following cerebral infarction: Secondary | ICD-10-CM | POA: Diagnosis not present

## 2016-06-11 DIAGNOSIS — I509 Heart failure, unspecified: Secondary | ICD-10-CM | POA: Diagnosis not present

## 2016-06-11 DIAGNOSIS — I4891 Unspecified atrial fibrillation: Secondary | ICD-10-CM | POA: Diagnosis not present

## 2016-06-11 DIAGNOSIS — I679 Cerebrovascular disease, unspecified: Secondary | ICD-10-CM | POA: Diagnosis not present

## 2016-06-11 DIAGNOSIS — I10 Essential (primary) hypertension: Secondary | ICD-10-CM | POA: Diagnosis not present

## 2016-06-12 ENCOUNTER — Encounter: Payer: Self-pay | Admitting: Nurse Practitioner

## 2016-06-12 ENCOUNTER — Encounter (INDEPENDENT_AMBULATORY_CARE_PROVIDER_SITE_OTHER): Payer: Self-pay

## 2016-06-12 ENCOUNTER — Ambulatory Visit (INDEPENDENT_AMBULATORY_CARE_PROVIDER_SITE_OTHER): Payer: Medicare Other | Admitting: Nurse Practitioner

## 2016-06-12 VITALS — BP 98/58 | HR 76 | Ht <= 58 in | Wt 97.8 lb

## 2016-06-12 DIAGNOSIS — I10 Essential (primary) hypertension: Secondary | ICD-10-CM

## 2016-06-12 DIAGNOSIS — I482 Chronic atrial fibrillation: Secondary | ICD-10-CM | POA: Diagnosis not present

## 2016-06-12 DIAGNOSIS — R001 Bradycardia, unspecified: Secondary | ICD-10-CM | POA: Diagnosis not present

## 2016-06-12 DIAGNOSIS — I4821 Permanent atrial fibrillation: Secondary | ICD-10-CM

## 2016-06-12 LAB — CUP PACEART INCLINIC DEVICE CHECK
Implantable Lead Implant Date: 20010403
Implantable Lead Model: 4463
MDC IDC LEAD LOCATION: 753860
MDC IDC LEAD SERIAL: 204151
MDC IDC SESS DTM: 20170713145342

## 2016-06-12 NOTE — Patient Instructions (Addendum)
Medication Instructions:   Your physician recommends that you continue on your current medications as directed. Please refer to the Current Medication list given to you today.   If you need a refill on your cardiac medications before your next appointment, please call your pharmacy.  Labwork: NONE ORDER TODAY    Testing/Procedures: NONE ORDER TODAY    Follow-Up:  Remote monitoring is used to monitor your Pacemaker of ICD from home. This monitoring reduces the number of office visits required to check your device to one time per year. It allows [place] to keep an eye on the functioning of your device to ensure it is working properly. You are scheduled for a device check from home on . 09/12/2016..You may send your transmission at any time that day. If you have a wireless device, the transmission will be sent automatically. After your physician reviews your transmission, you will receive a postcard with your next transmission date.     Any Other Special Instructions Will Be Listed Below (If Applicable).

## 2016-06-12 NOTE — Progress Notes (Signed)
Electrophysiology Office Note Date: 06/12/2016  ID:  Christy Miranda, DOB [birth year], MRN [id number]  PCP: [name], MD Primary Cardiologist: [place] Electrophysiologist: [place]  CC: Pacemaker follow-up  Christy Miranda is a 82 y.o. female seen today for Dr Rayann Heman.  She presents today for routine electrophysiology followup.  She is seen with her daughter and caregiver today. She is non-verbal and becoming increasingly difficult to transport. She still lives at home. ROS unable to be obtained.   Device History: MDT dual chamber PPM implanted 2001 for symptomatic bradycardia; gen change 2007; gen change 2013   Past Medical History  Diagnosis Date  . HTN (hypertension)   . Permanent atrial fibrillation ([place])   . Bradycardia     s/p PPM  . Hypothyroidism   . Anxiety   . Diverticulitis     history of  . History of hysterectomy   . Long-term (current) use of anticoagulants   . Scoliosis (and kyphoscoliosis), idiopathic   . DVT (deep vein thrombosis) in pregnancy     right peroneal  . Dizziness and giddiness 07/22/2013  . Stroke ([place])   . Pelvis fracture ([place]) 05-24-14  . Atrial fibrillation [facility])    Past Surgical History  Procedure Laterality Date  . Pacemaker insertion  03/03/2006    most recent generator (MDT) change 10/30/06 by Dr Verlon Setting  . Cardiac catheterization  01/08/1999    normal LV function  . Cardioversion  10/30/2006    Successful elective DC cardioversion  . Cardioversion  12/29/2002    Successful DC cardioversion  . Cardioversion  12/03/1999    Successful DC cardioversion  . Cholecystectomy    . Pacemaker generator change N/A 11/09/2012    Procedure: PACEMAKER GENERATOR CHANGE;  Surgeon: Thompson Grayer, MD;  Location: [facility] CATH LAB;  Service: Cardiovascular;  Laterality: N/A;    Current Outpatient Prescriptions  Medication Sig Dispense Refill  . acetaminophen (TYLENOL) 325 MG tablet Take 1 tablet (325 mg total) by mouth every 6 (six) hours as needed  for mild pain, fever or headache.    [name] aspirin 81 MG tablet Take 81 mg by mouth daily.    [name] levothyroxine (SYNTHROID, LEVOTHROID) 50 MCG tablet Take 1 tablet (50 mcg total) by mouth daily. 90 tablet 3  . metoprolol (LOPRESSOR) 50 MG tablet Take 1 tablet (50 mg total) by mouth 2 (two) times daily. 180 tablet 0  . pantoprazole (PROTONIX) 40 MG tablet Take 40 mg by mouth daily.    . sertraline (ZOLOFT) 50 MG tablet Take 50 mg by mouth daily.     No current facility-administered medications for this visit.    Allergies:   Review of patient's allergies indicates no known allergies.   Social History: Social History   Social History  . Marital Status: Widowed    Spouse Name: N/A  . Number of Children: 1  . Years of Education: 12   Occupational History  . Retired    Social History Main Topics  . Smoking status: Never Smoker   . Smokeless tobacco: Never Used  . Alcohol Use: No  . Drug Use: No  . Sexual Activity: No   Other Topics Concern  . Not on file   Social History Narrative   Lives with care giver in [place], [place] .  Right handed.  Caffeine None.      Family History: Family History  Problem Relation Age of Onset  . Coronary artery disease Mother   . Cancer - Other Father     Pancreatic  Cancer    Review of Systems: All other systems reviewed and are otherwise negative except as noted above.   Physical Exam: VS:  BP 98/58 mmHg  Pulse 76  Ht 4\' 10"  (1.473 m)  Wt 97 lb 12.8 oz (44.362 kg)  BMI 20.45 kg/m2  SpO2 96% , BMI Body mass index is 20.45 kg/(m^2).  GEN- The patient is elderly and kyphotic appearing, non-verbal HEENT: normocephalic, atraumatic; sclera clear, conjunctiva pink; hearing intact; oropharynx clear; neck supple Lungs- Clear to ausculation bilaterally, normal work of breathing.  No wheezes, rales, rhonchi Heart- Irregular rate and rhythm GI- soft, non-tender, non-distended, bowel sounds present Extremities- no clubbing, cyanosis, or edema;  DP/PT/radial pulses 2+ bilaterally MS- no significant deformity or atrophy Skin- warm and dry, no rash or lesion; PPM pocket well healed Psych- euthymic mood, full affect Neuro- strength and sensation are intact  PPM Interrogation- reviewed in detail today,  See PACEART report  EKG:  EKG is not ordered today.  Recent Labs: 08/02/2015: BUN 12; Creatinine, Ser 0.53; Hemoglobin 12.0; Platelets 155; Potassium 4.4; Sodium 136   Wt Readings from Last 3 Encounters:  06/12/16 97 lb 12.8 oz (44.362 kg)  03/12/15 106 lb 6.4 oz (48.263 kg)  01/18/15 103 lb 6.4 oz (46.902 kg)     Other studies Reviewed: Additional studies/ records that were reviewed today include: Dr Martinique and Dr Jackalyn Lombard office notes  Assessment and Plan:  1.  Symptomatic bradycardia Normal PPM function See Pace Art report No changes today  2.  Permanent atrial fibrillation V rates controlled by device interrogation today No OAC 2/2 bleeding concerns  3.  HTN Stable No change required today  The patient is becoming increasingly difficult to transport. As she has had incredibly stable thresholds, I have recommended remote follow up on for pacemaker at this point.  Her daughter is aware to call if she has any concerns and thinks patient needs to be seen.    Current medicines are reviewed at length with the patient today.   The patient does not have concerns regarding her medicines.  The following changes were made today:  none  Labs/ tests ordered today include: none  No orders of the defined types were placed in this encounter.     Disposition:   Follow up with [facility], no further in office pacemaker checks.    Signed, Chanetta Marshall, NP 06/12/2016 2:49 PM  [address] [place] [address] [phone] (office) [phone] (fax)

## 2016-06-13 DIAGNOSIS — I10 Essential (primary) hypertension: Secondary | ICD-10-CM | POA: Diagnosis not present

## 2016-06-13 DIAGNOSIS — I69320 Aphasia following cerebral infarction: Secondary | ICD-10-CM | POA: Diagnosis not present

## 2016-06-13 DIAGNOSIS — I4891 Unspecified atrial fibrillation: Secondary | ICD-10-CM | POA: Diagnosis not present

## 2016-06-13 DIAGNOSIS — I509 Heart failure, unspecified: Secondary | ICD-10-CM | POA: Diagnosis not present

## 2016-06-13 DIAGNOSIS — I679 Cerebrovascular disease, unspecified: Secondary | ICD-10-CM | POA: Diagnosis not present

## 2016-06-17 DIAGNOSIS — I10 Essential (primary) hypertension: Secondary | ICD-10-CM | POA: Diagnosis not present

## 2016-06-17 DIAGNOSIS — I4891 Unspecified atrial fibrillation: Secondary | ICD-10-CM | POA: Diagnosis not present

## 2016-06-17 DIAGNOSIS — I69320 Aphasia following cerebral infarction: Secondary | ICD-10-CM | POA: Diagnosis not present

## 2016-06-17 DIAGNOSIS — I679 Cerebrovascular disease, unspecified: Secondary | ICD-10-CM | POA: Diagnosis not present

## 2016-06-17 DIAGNOSIS — I509 Heart failure, unspecified: Secondary | ICD-10-CM | POA: Diagnosis not present

## 2016-06-18 DIAGNOSIS — I4891 Unspecified atrial fibrillation: Secondary | ICD-10-CM | POA: Diagnosis not present

## 2016-06-18 DIAGNOSIS — I10 Essential (primary) hypertension: Secondary | ICD-10-CM | POA: Diagnosis not present

## 2016-06-18 DIAGNOSIS — I69320 Aphasia following cerebral infarction: Secondary | ICD-10-CM | POA: Diagnosis not present

## 2016-06-18 DIAGNOSIS — I509 Heart failure, unspecified: Secondary | ICD-10-CM | POA: Diagnosis not present

## 2016-06-18 DIAGNOSIS — I679 Cerebrovascular disease, unspecified: Secondary | ICD-10-CM | POA: Diagnosis not present

## 2016-06-24 DIAGNOSIS — I679 Cerebrovascular disease, unspecified: Secondary | ICD-10-CM | POA: Diagnosis not present

## 2016-06-24 DIAGNOSIS — I4891 Unspecified atrial fibrillation: Secondary | ICD-10-CM | POA: Diagnosis not present

## 2016-06-24 DIAGNOSIS — I509 Heart failure, unspecified: Secondary | ICD-10-CM | POA: Diagnosis not present

## 2016-06-24 DIAGNOSIS — I69320 Aphasia following cerebral infarction: Secondary | ICD-10-CM | POA: Diagnosis not present

## 2016-06-24 DIAGNOSIS — I10 Essential (primary) hypertension: Secondary | ICD-10-CM | POA: Diagnosis not present

## 2016-06-26 ENCOUNTER — Encounter: Payer: Self-pay | Admitting: Internal Medicine

## 2016-06-30 DIAGNOSIS — I4891 Unspecified atrial fibrillation: Secondary | ICD-10-CM | POA: Diagnosis not present

## 2016-06-30 DIAGNOSIS — I509 Heart failure, unspecified: Secondary | ICD-10-CM | POA: Diagnosis not present

## 2016-06-30 DIAGNOSIS — I69320 Aphasia following cerebral infarction: Secondary | ICD-10-CM | POA: Diagnosis not present

## 2016-06-30 DIAGNOSIS — I10 Essential (primary) hypertension: Secondary | ICD-10-CM | POA: Diagnosis not present

## 2016-06-30 DIAGNOSIS — I679 Cerebrovascular disease, unspecified: Secondary | ICD-10-CM | POA: Diagnosis not present

## 2016-07-01 DIAGNOSIS — I679 Cerebrovascular disease, unspecified: Secondary | ICD-10-CM | POA: Diagnosis not present

## 2016-07-01 DIAGNOSIS — I69320 Aphasia following cerebral infarction: Secondary | ICD-10-CM | POA: Diagnosis not present

## 2016-07-01 DIAGNOSIS — I10 Essential (primary) hypertension: Secondary | ICD-10-CM | POA: Diagnosis not present

## 2016-07-01 DIAGNOSIS — I4891 Unspecified atrial fibrillation: Secondary | ICD-10-CM | POA: Diagnosis not present

## 2016-07-01 DIAGNOSIS — I509 Heart failure, unspecified: Secondary | ICD-10-CM | POA: Diagnosis not present

## 2016-07-02 DIAGNOSIS — I10 Essential (primary) hypertension: Secondary | ICD-10-CM | POA: Diagnosis not present

## 2016-07-02 DIAGNOSIS — I509 Heart failure, unspecified: Secondary | ICD-10-CM | POA: Diagnosis not present

## 2016-07-02 DIAGNOSIS — I679 Cerebrovascular disease, unspecified: Secondary | ICD-10-CM | POA: Diagnosis not present

## 2016-07-02 DIAGNOSIS — I69320 Aphasia following cerebral infarction: Secondary | ICD-10-CM | POA: Diagnosis not present

## 2016-07-02 DIAGNOSIS — I4891 Unspecified atrial fibrillation: Secondary | ICD-10-CM | POA: Diagnosis not present

## 2016-07-03 DIAGNOSIS — I69320 Aphasia following cerebral infarction: Secondary | ICD-10-CM | POA: Diagnosis not present

## 2016-07-03 DIAGNOSIS — I10 Essential (primary) hypertension: Secondary | ICD-10-CM | POA: Diagnosis not present

## 2016-07-03 DIAGNOSIS — I4891 Unspecified atrial fibrillation: Secondary | ICD-10-CM | POA: Diagnosis not present

## 2016-07-03 DIAGNOSIS — I509 Heart failure, unspecified: Secondary | ICD-10-CM | POA: Diagnosis not present

## 2016-07-03 DIAGNOSIS — I679 Cerebrovascular disease, unspecified: Secondary | ICD-10-CM | POA: Diagnosis not present

## 2016-07-08 DIAGNOSIS — I69320 Aphasia following cerebral infarction: Secondary | ICD-10-CM | POA: Diagnosis not present

## 2016-07-08 DIAGNOSIS — I4891 Unspecified atrial fibrillation: Secondary | ICD-10-CM | POA: Diagnosis not present

## 2016-07-08 DIAGNOSIS — I509 Heart failure, unspecified: Secondary | ICD-10-CM | POA: Diagnosis not present

## 2016-07-08 DIAGNOSIS — I679 Cerebrovascular disease, unspecified: Secondary | ICD-10-CM | POA: Diagnosis not present

## 2016-07-08 DIAGNOSIS — I10 Essential (primary) hypertension: Secondary | ICD-10-CM | POA: Diagnosis not present

## 2016-07-14 DIAGNOSIS — I679 Cerebrovascular disease, unspecified: Secondary | ICD-10-CM | POA: Diagnosis not present

## 2016-07-14 DIAGNOSIS — I509 Heart failure, unspecified: Secondary | ICD-10-CM | POA: Diagnosis not present

## 2016-07-14 DIAGNOSIS — I10 Essential (primary) hypertension: Secondary | ICD-10-CM | POA: Diagnosis not present

## 2016-07-14 DIAGNOSIS — I4891 Unspecified atrial fibrillation: Secondary | ICD-10-CM | POA: Diagnosis not present

## 2016-07-14 DIAGNOSIS — I69320 Aphasia following cerebral infarction: Secondary | ICD-10-CM | POA: Diagnosis not present

## 2016-07-17 DIAGNOSIS — I10 Essential (primary) hypertension: Secondary | ICD-10-CM | POA: Diagnosis not present

## 2016-07-17 DIAGNOSIS — I69320 Aphasia following cerebral infarction: Secondary | ICD-10-CM | POA: Diagnosis not present

## 2016-07-17 DIAGNOSIS — I4891 Unspecified atrial fibrillation: Secondary | ICD-10-CM | POA: Diagnosis not present

## 2016-07-17 DIAGNOSIS — I679 Cerebrovascular disease, unspecified: Secondary | ICD-10-CM | POA: Diagnosis not present

## 2016-07-17 DIAGNOSIS — I509 Heart failure, unspecified: Secondary | ICD-10-CM | POA: Diagnosis not present

## 2016-07-21 DIAGNOSIS — I69320 Aphasia following cerebral infarction: Secondary | ICD-10-CM | POA: Diagnosis not present

## 2016-07-21 DIAGNOSIS — I10 Essential (primary) hypertension: Secondary | ICD-10-CM | POA: Diagnosis not present

## 2016-07-21 DIAGNOSIS — I509 Heart failure, unspecified: Secondary | ICD-10-CM | POA: Diagnosis not present

## 2016-07-21 DIAGNOSIS — I4891 Unspecified atrial fibrillation: Secondary | ICD-10-CM | POA: Diagnosis not present

## 2016-07-21 DIAGNOSIS — I679 Cerebrovascular disease, unspecified: Secondary | ICD-10-CM | POA: Diagnosis not present

## 2016-07-23 DIAGNOSIS — I4891 Unspecified atrial fibrillation: Secondary | ICD-10-CM | POA: Diagnosis not present

## 2016-07-23 DIAGNOSIS — I10 Essential (primary) hypertension: Secondary | ICD-10-CM | POA: Diagnosis not present

## 2016-07-23 DIAGNOSIS — I509 Heart failure, unspecified: Secondary | ICD-10-CM | POA: Diagnosis not present

## 2016-07-23 DIAGNOSIS — I679 Cerebrovascular disease, unspecified: Secondary | ICD-10-CM | POA: Diagnosis not present

## 2016-07-23 DIAGNOSIS — I69320 Aphasia following cerebral infarction: Secondary | ICD-10-CM | POA: Diagnosis not present

## 2016-07-30 DIAGNOSIS — I10 Essential (primary) hypertension: Secondary | ICD-10-CM | POA: Diagnosis not present

## 2016-07-30 DIAGNOSIS — I69320 Aphasia following cerebral infarction: Secondary | ICD-10-CM | POA: Diagnosis not present

## 2016-07-30 DIAGNOSIS — I679 Cerebrovascular disease, unspecified: Secondary | ICD-10-CM | POA: Diagnosis not present

## 2016-07-30 DIAGNOSIS — I509 Heart failure, unspecified: Secondary | ICD-10-CM | POA: Diagnosis not present

## 2016-07-30 DIAGNOSIS — I4891 Unspecified atrial fibrillation: Secondary | ICD-10-CM | POA: Diagnosis not present

## 2016-07-31 DIAGNOSIS — I69320 Aphasia following cerebral infarction: Secondary | ICD-10-CM | POA: Diagnosis not present

## 2016-07-31 DIAGNOSIS — I4891 Unspecified atrial fibrillation: Secondary | ICD-10-CM | POA: Diagnosis not present

## 2016-07-31 DIAGNOSIS — I10 Essential (primary) hypertension: Secondary | ICD-10-CM | POA: Diagnosis not present

## 2016-07-31 DIAGNOSIS — I509 Heart failure, unspecified: Secondary | ICD-10-CM | POA: Diagnosis not present

## 2016-07-31 DIAGNOSIS — I679 Cerebrovascular disease, unspecified: Secondary | ICD-10-CM | POA: Diagnosis not present

## 2016-08-01 DIAGNOSIS — I4891 Unspecified atrial fibrillation: Secondary | ICD-10-CM | POA: Diagnosis not present

## 2016-08-01 DIAGNOSIS — I10 Essential (primary) hypertension: Secondary | ICD-10-CM | POA: Diagnosis not present

## 2016-08-01 DIAGNOSIS — I679 Cerebrovascular disease, unspecified: Secondary | ICD-10-CM | POA: Diagnosis not present

## 2016-08-01 DIAGNOSIS — I69320 Aphasia following cerebral infarction: Secondary | ICD-10-CM | POA: Diagnosis not present

## 2016-08-01 DIAGNOSIS — I509 Heart failure, unspecified: Secondary | ICD-10-CM | POA: Diagnosis not present

## 2016-08-02 ENCOUNTER — Other Ambulatory Visit: Payer: Self-pay | Admitting: Cardiology

## 2016-08-05 DIAGNOSIS — I4891 Unspecified atrial fibrillation: Secondary | ICD-10-CM | POA: Diagnosis not present

## 2016-08-05 DIAGNOSIS — I509 Heart failure, unspecified: Secondary | ICD-10-CM | POA: Diagnosis not present

## 2016-08-05 DIAGNOSIS — I679 Cerebrovascular disease, unspecified: Secondary | ICD-10-CM | POA: Diagnosis not present

## 2016-08-05 DIAGNOSIS — I69320 Aphasia following cerebral infarction: Secondary | ICD-10-CM | POA: Diagnosis not present

## 2016-08-05 DIAGNOSIS — I10 Essential (primary) hypertension: Secondary | ICD-10-CM | POA: Diagnosis not present

## 2016-08-05 NOTE — Telephone Encounter (Signed)
Rx(s) sent to pharmacy electronically.  

## 2016-08-13 ENCOUNTER — Ambulatory Visit: Payer: Medicare Other | Admitting: Podiatry

## 2016-08-14 DIAGNOSIS — I10 Essential (primary) hypertension: Secondary | ICD-10-CM | POA: Diagnosis not present

## 2016-08-14 DIAGNOSIS — I4891 Unspecified atrial fibrillation: Secondary | ICD-10-CM | POA: Diagnosis not present

## 2016-08-14 DIAGNOSIS — I679 Cerebrovascular disease, unspecified: Secondary | ICD-10-CM | POA: Diagnosis not present

## 2016-08-14 DIAGNOSIS — I509 Heart failure, unspecified: Secondary | ICD-10-CM | POA: Diagnosis not present

## 2016-08-14 DIAGNOSIS — I69320 Aphasia following cerebral infarction: Secondary | ICD-10-CM | POA: Diagnosis not present

## 2016-08-18 DIAGNOSIS — I4891 Unspecified atrial fibrillation: Secondary | ICD-10-CM | POA: Diagnosis not present

## 2016-08-18 DIAGNOSIS — I10 Essential (primary) hypertension: Secondary | ICD-10-CM | POA: Diagnosis not present

## 2016-08-18 DIAGNOSIS — I679 Cerebrovascular disease, unspecified: Secondary | ICD-10-CM | POA: Diagnosis not present

## 2016-08-18 DIAGNOSIS — I69320 Aphasia following cerebral infarction: Secondary | ICD-10-CM | POA: Diagnosis not present

## 2016-08-18 DIAGNOSIS — I509 Heart failure, unspecified: Secondary | ICD-10-CM | POA: Diagnosis not present

## 2016-08-22 DIAGNOSIS — I509 Heart failure, unspecified: Secondary | ICD-10-CM | POA: Diagnosis not present

## 2016-08-22 DIAGNOSIS — I4891 Unspecified atrial fibrillation: Secondary | ICD-10-CM | POA: Diagnosis not present

## 2016-08-22 DIAGNOSIS — I679 Cerebrovascular disease, unspecified: Secondary | ICD-10-CM | POA: Diagnosis not present

## 2016-08-22 DIAGNOSIS — I10 Essential (primary) hypertension: Secondary | ICD-10-CM | POA: Diagnosis not present

## 2016-08-22 DIAGNOSIS — I69320 Aphasia following cerebral infarction: Secondary | ICD-10-CM | POA: Diagnosis not present

## 2016-08-25 ENCOUNTER — Ambulatory Visit (INDEPENDENT_AMBULATORY_CARE_PROVIDER_SITE_OTHER): Payer: Medicare Other | Admitting: Podiatry

## 2016-08-25 DIAGNOSIS — L609 Nail disorder, unspecified: Secondary | ICD-10-CM

## 2016-08-25 DIAGNOSIS — B351 Tinea unguium: Secondary | ICD-10-CM

## 2016-08-25 DIAGNOSIS — L97911 Non-pressure chronic ulcer of unspecified part of right lower leg limited to breakdown of skin: Secondary | ICD-10-CM

## 2016-08-25 DIAGNOSIS — L608 Other nail disorders: Secondary | ICD-10-CM

## 2016-08-25 DIAGNOSIS — L603 Nail dystrophy: Secondary | ICD-10-CM

## 2016-08-25 DIAGNOSIS — M79676 Pain in unspecified toe(s): Secondary | ICD-10-CM

## 2016-08-25 DIAGNOSIS — I872 Venous insufficiency (chronic) (peripheral): Secondary | ICD-10-CM

## 2016-08-25 DIAGNOSIS — M79609 Pain in unspecified limb: Secondary | ICD-10-CM

## 2016-08-25 DIAGNOSIS — L89891 Pressure ulcer of other site, stage 1: Secondary | ICD-10-CM

## 2016-08-25 NOTE — Progress Notes (Signed)
SUBJECTIVE Patient  presents to office today complaining of elongated, thickened nails. Pain while ambulating in shoes. Patient is unable to trim their own nails. Patient also has a complaint of a right posterior leg ulceration secondary to trauma.  OBJECTIVE General Patient is awake, alert, and oriented x 3 and in no acute distress. Derm Ulceration noted to the right posterior leg measuring 0.5 cm in length by 0.5 cm in width by 0.1 splinters in depth to the noted ulceration there is no eschar there is minimal slough fibrin and necrotic tissue graduation tissue is red wound base is red. There is no exposed bone muscle tendon ligament or joint. There is no odor. Periwound integrity is intact.   Skin is dry and supple bilateral. Negative open lesions or macerations. Remaining integument unremarkable. Nails are tender, long, thickened and dystrophic with subungual debris, consistent with onychomycosis, 1-5 bilateral. No signs of infection noted. Vasc  DP and PT pedal pulses palpable bilaterally. Temperature gradient within normal limits.  Neuro Epicritic and protective threshold sensation diminished bilaterally.  Musculoskeletal Exam No symptomatic pedal deformities noted bilateral. Muscular strength within normal limits.  ASSESSMENT 1. Onychodystrophic nails 1-5 bilateral with hyperkeratosis of nails.  2. Onychomycosis of nail due to dermatophyte bilateral 3. Pain in foot bilateral 4. Ulcer right posterior leg secondary to venous insufficiency  PLAN OF CARE 1. Patient evaluated today.  2. Instructed to maintain good pedal hygiene and foot care.  3. Mechanical debridement of nails 1-5 bilaterally performed using a nail nipper. Filed with dremel without incident.  4. Medically necessary excisional debridement of posterior leg ulcer was performed using a tissue nipper. 5. Recommend over-the-counter foot cream to be applied daily  6. Return to clinic in 3 mos.    Edrick Kins, DPM

## 2016-08-28 DIAGNOSIS — I679 Cerebrovascular disease, unspecified: Secondary | ICD-10-CM | POA: Diagnosis not present

## 2016-08-28 DIAGNOSIS — I4891 Unspecified atrial fibrillation: Secondary | ICD-10-CM | POA: Diagnosis not present

## 2016-08-28 DIAGNOSIS — I69320 Aphasia following cerebral infarction: Secondary | ICD-10-CM | POA: Diagnosis not present

## 2016-08-28 DIAGNOSIS — I509 Heart failure, unspecified: Secondary | ICD-10-CM | POA: Diagnosis not present

## 2016-08-28 DIAGNOSIS — I10 Essential (primary) hypertension: Secondary | ICD-10-CM | POA: Diagnosis not present

## 2016-08-31 DIAGNOSIS — I4891 Unspecified atrial fibrillation: Secondary | ICD-10-CM | POA: Diagnosis not present

## 2016-08-31 DIAGNOSIS — I69320 Aphasia following cerebral infarction: Secondary | ICD-10-CM | POA: Diagnosis not present

## 2016-08-31 DIAGNOSIS — I10 Essential (primary) hypertension: Secondary | ICD-10-CM | POA: Diagnosis not present

## 2016-08-31 DIAGNOSIS — I509 Heart failure, unspecified: Secondary | ICD-10-CM | POA: Diagnosis not present

## 2016-08-31 DIAGNOSIS — I679 Cerebrovascular disease, unspecified: Secondary | ICD-10-CM | POA: Diagnosis not present

## 2016-09-02 DIAGNOSIS — I509 Heart failure, unspecified: Secondary | ICD-10-CM | POA: Diagnosis not present

## 2016-09-02 DIAGNOSIS — I69320 Aphasia following cerebral infarction: Secondary | ICD-10-CM | POA: Diagnosis not present

## 2016-09-02 DIAGNOSIS — I4891 Unspecified atrial fibrillation: Secondary | ICD-10-CM | POA: Diagnosis not present

## 2016-09-02 DIAGNOSIS — I10 Essential (primary) hypertension: Secondary | ICD-10-CM | POA: Diagnosis not present

## 2016-09-02 DIAGNOSIS — I679 Cerebrovascular disease, unspecified: Secondary | ICD-10-CM | POA: Diagnosis not present

## 2016-09-06 DIAGNOSIS — I509 Heart failure, unspecified: Secondary | ICD-10-CM | POA: Diagnosis not present

## 2016-09-06 DIAGNOSIS — I679 Cerebrovascular disease, unspecified: Secondary | ICD-10-CM | POA: Diagnosis not present

## 2016-09-06 DIAGNOSIS — I69320 Aphasia following cerebral infarction: Secondary | ICD-10-CM | POA: Diagnosis not present

## 2016-09-06 DIAGNOSIS — I4891 Unspecified atrial fibrillation: Secondary | ICD-10-CM | POA: Diagnosis not present

## 2016-09-06 DIAGNOSIS — I10 Essential (primary) hypertension: Secondary | ICD-10-CM | POA: Diagnosis not present

## 2016-09-09 DIAGNOSIS — I69320 Aphasia following cerebral infarction: Secondary | ICD-10-CM | POA: Diagnosis not present

## 2016-09-09 DIAGNOSIS — I509 Heart failure, unspecified: Secondary | ICD-10-CM | POA: Diagnosis not present

## 2016-09-09 DIAGNOSIS — I4891 Unspecified atrial fibrillation: Secondary | ICD-10-CM | POA: Diagnosis not present

## 2016-09-09 DIAGNOSIS — I679 Cerebrovascular disease, unspecified: Secondary | ICD-10-CM | POA: Diagnosis not present

## 2016-09-09 DIAGNOSIS — I10 Essential (primary) hypertension: Secondary | ICD-10-CM | POA: Diagnosis not present

## 2016-09-12 ENCOUNTER — Ambulatory Visit (INDEPENDENT_AMBULATORY_CARE_PROVIDER_SITE_OTHER): Payer: Medicare Other | Admitting: *Deleted

## 2016-09-12 ENCOUNTER — Telehealth: Payer: Self-pay | Admitting: Cardiology

## 2016-09-12 DIAGNOSIS — I4891 Unspecified atrial fibrillation: Secondary | ICD-10-CM | POA: Diagnosis not present

## 2016-09-12 DIAGNOSIS — I679 Cerebrovascular disease, unspecified: Secondary | ICD-10-CM | POA: Diagnosis not present

## 2016-09-12 DIAGNOSIS — I69320 Aphasia following cerebral infarction: Secondary | ICD-10-CM | POA: Diagnosis not present

## 2016-09-12 DIAGNOSIS — I10 Essential (primary) hypertension: Secondary | ICD-10-CM | POA: Diagnosis not present

## 2016-09-12 DIAGNOSIS — I509 Heart failure, unspecified: Secondary | ICD-10-CM | POA: Diagnosis not present

## 2016-09-12 DIAGNOSIS — I495 Sick sinus syndrome: Secondary | ICD-10-CM

## 2016-09-12 NOTE — Telephone Encounter (Signed)
LMOVM reminding pt to send remote transmission.   

## 2016-09-15 NOTE — Progress Notes (Signed)
Remote pacemaker transmission.   

## 2016-09-16 DIAGNOSIS — I10 Essential (primary) hypertension: Secondary | ICD-10-CM | POA: Diagnosis not present

## 2016-09-16 DIAGNOSIS — I679 Cerebrovascular disease, unspecified: Secondary | ICD-10-CM | POA: Diagnosis not present

## 2016-09-16 DIAGNOSIS — I69320 Aphasia following cerebral infarction: Secondary | ICD-10-CM | POA: Diagnosis not present

## 2016-09-16 DIAGNOSIS — I4891 Unspecified atrial fibrillation: Secondary | ICD-10-CM | POA: Diagnosis not present

## 2016-09-16 DIAGNOSIS — I509 Heart failure, unspecified: Secondary | ICD-10-CM | POA: Diagnosis not present

## 2016-09-17 ENCOUNTER — Encounter: Payer: Self-pay | Admitting: Cardiology

## 2016-09-23 DIAGNOSIS — I679 Cerebrovascular disease, unspecified: Secondary | ICD-10-CM | POA: Diagnosis not present

## 2016-09-23 DIAGNOSIS — I509 Heart failure, unspecified: Secondary | ICD-10-CM | POA: Diagnosis not present

## 2016-09-23 DIAGNOSIS — I10 Essential (primary) hypertension: Secondary | ICD-10-CM | POA: Diagnosis not present

## 2016-09-23 DIAGNOSIS — I69320 Aphasia following cerebral infarction: Secondary | ICD-10-CM | POA: Diagnosis not present

## 2016-09-23 DIAGNOSIS — I4891 Unspecified atrial fibrillation: Secondary | ICD-10-CM | POA: Diagnosis not present

## 2016-09-24 DIAGNOSIS — I10 Essential (primary) hypertension: Secondary | ICD-10-CM | POA: Diagnosis not present

## 2016-09-24 DIAGNOSIS — I69320 Aphasia following cerebral infarction: Secondary | ICD-10-CM | POA: Diagnosis not present

## 2016-09-24 DIAGNOSIS — I4891 Unspecified atrial fibrillation: Secondary | ICD-10-CM | POA: Diagnosis not present

## 2016-09-24 DIAGNOSIS — I509 Heart failure, unspecified: Secondary | ICD-10-CM | POA: Diagnosis not present

## 2016-09-24 DIAGNOSIS — I679 Cerebrovascular disease, unspecified: Secondary | ICD-10-CM | POA: Diagnosis not present

## 2016-09-29 DIAGNOSIS — I69320 Aphasia following cerebral infarction: Secondary | ICD-10-CM | POA: Diagnosis not present

## 2016-09-29 DIAGNOSIS — I679 Cerebrovascular disease, unspecified: Secondary | ICD-10-CM | POA: Diagnosis not present

## 2016-09-29 DIAGNOSIS — I10 Essential (primary) hypertension: Secondary | ICD-10-CM | POA: Diagnosis not present

## 2016-09-29 DIAGNOSIS — I4891 Unspecified atrial fibrillation: Secondary | ICD-10-CM | POA: Diagnosis not present

## 2016-09-29 DIAGNOSIS — I509 Heart failure, unspecified: Secondary | ICD-10-CM | POA: Diagnosis not present

## 2016-10-01 DIAGNOSIS — I4891 Unspecified atrial fibrillation: Secondary | ICD-10-CM | POA: Diagnosis not present

## 2016-10-01 DIAGNOSIS — I69320 Aphasia following cerebral infarction: Secondary | ICD-10-CM | POA: Diagnosis not present

## 2016-10-01 DIAGNOSIS — I679 Cerebrovascular disease, unspecified: Secondary | ICD-10-CM | POA: Diagnosis not present

## 2016-10-01 DIAGNOSIS — I10 Essential (primary) hypertension: Secondary | ICD-10-CM | POA: Diagnosis not present

## 2016-10-01 DIAGNOSIS — I509 Heart failure, unspecified: Secondary | ICD-10-CM | POA: Diagnosis not present

## 2016-10-06 DIAGNOSIS — I10 Essential (primary) hypertension: Secondary | ICD-10-CM | POA: Diagnosis not present

## 2016-10-06 DIAGNOSIS — I4891 Unspecified atrial fibrillation: Secondary | ICD-10-CM | POA: Diagnosis not present

## 2016-10-06 DIAGNOSIS — I509 Heart failure, unspecified: Secondary | ICD-10-CM | POA: Diagnosis not present

## 2016-10-06 DIAGNOSIS — I679 Cerebrovascular disease, unspecified: Secondary | ICD-10-CM | POA: Diagnosis not present

## 2016-10-06 DIAGNOSIS — I69320 Aphasia following cerebral infarction: Secondary | ICD-10-CM | POA: Diagnosis not present

## 2016-10-07 DIAGNOSIS — F322 Major depressive disorder, single episode, severe without psychotic features: Secondary | ICD-10-CM | POA: Diagnosis not present

## 2016-10-07 DIAGNOSIS — I10 Essential (primary) hypertension: Secondary | ICD-10-CM | POA: Diagnosis not present

## 2016-10-07 DIAGNOSIS — D709 Neutropenia, unspecified: Secondary | ICD-10-CM | POA: Diagnosis not present

## 2016-10-07 DIAGNOSIS — E039 Hypothyroidism, unspecified: Secondary | ICD-10-CM | POA: Diagnosis not present

## 2016-10-07 DIAGNOSIS — I509 Heart failure, unspecified: Secondary | ICD-10-CM | POA: Diagnosis not present

## 2016-10-07 DIAGNOSIS — K219 Gastro-esophageal reflux disease without esophagitis: Secondary | ICD-10-CM | POA: Diagnosis not present

## 2016-10-07 DIAGNOSIS — I69320 Aphasia following cerebral infarction: Secondary | ICD-10-CM | POA: Diagnosis not present

## 2016-10-07 DIAGNOSIS — I679 Cerebrovascular disease, unspecified: Secondary | ICD-10-CM | POA: Diagnosis not present

## 2016-10-07 DIAGNOSIS — I481 Persistent atrial fibrillation: Secondary | ICD-10-CM | POA: Diagnosis not present

## 2016-10-07 DIAGNOSIS — I639 Cerebral infarction, unspecified: Secondary | ICD-10-CM | POA: Diagnosis not present

## 2016-10-07 DIAGNOSIS — I4891 Unspecified atrial fibrillation: Secondary | ICD-10-CM | POA: Diagnosis not present

## 2016-10-14 LAB — CUP PACEART REMOTE DEVICE CHECK
Battery Remaining Longevity: 74 mo
Date Time Interrogation Session: 20171013184700
Implantable Lead Serial Number: 204151
Lead Channel Impedance Value: 0 Ohm
Lead Channel Pacing Threshold Amplitude: 0.625 V
Lead Channel Pacing Threshold Pulse Width: 0.4 ms
Lead Channel Setting Pacing Amplitude: 2.5 V
MDC IDC LEAD IMPLANT DT: 20010403
MDC IDC LEAD LOCATION: 753860
MDC IDC MSMT BATTERY IMPEDANCE: 777 Ohm
MDC IDC MSMT BATTERY VOLTAGE: 2.78 V
MDC IDC MSMT LEADCHNL RV IMPEDANCE VALUE: 513 Ohm
MDC IDC PG IMPLANT DT: 20131210
MDC IDC SET LEADCHNL RV PACING PULSEWIDTH: 0.4 ms
MDC IDC SET LEADCHNL RV SENSING SENSITIVITY: 2 mV
MDC IDC STAT BRADY RV PERCENT PACED: 33 %

## 2016-10-15 DIAGNOSIS — I679 Cerebrovascular disease, unspecified: Secondary | ICD-10-CM | POA: Diagnosis not present

## 2016-10-15 DIAGNOSIS — I4891 Unspecified atrial fibrillation: Secondary | ICD-10-CM | POA: Diagnosis not present

## 2016-10-15 DIAGNOSIS — I10 Essential (primary) hypertension: Secondary | ICD-10-CM | POA: Diagnosis not present

## 2016-10-15 DIAGNOSIS — I509 Heart failure, unspecified: Secondary | ICD-10-CM | POA: Diagnosis not present

## 2016-10-15 DIAGNOSIS — I69320 Aphasia following cerebral infarction: Secondary | ICD-10-CM | POA: Diagnosis not present

## 2016-10-16 DIAGNOSIS — I4891 Unspecified atrial fibrillation: Secondary | ICD-10-CM | POA: Diagnosis not present

## 2016-10-16 DIAGNOSIS — I10 Essential (primary) hypertension: Secondary | ICD-10-CM | POA: Diagnosis not present

## 2016-10-16 DIAGNOSIS — I69320 Aphasia following cerebral infarction: Secondary | ICD-10-CM | POA: Diagnosis not present

## 2016-10-16 DIAGNOSIS — I509 Heart failure, unspecified: Secondary | ICD-10-CM | POA: Diagnosis not present

## 2016-10-16 DIAGNOSIS — I679 Cerebrovascular disease, unspecified: Secondary | ICD-10-CM | POA: Diagnosis not present

## 2016-10-21 DIAGNOSIS — I10 Essential (primary) hypertension: Secondary | ICD-10-CM | POA: Diagnosis not present

## 2016-10-21 DIAGNOSIS — I4891 Unspecified atrial fibrillation: Secondary | ICD-10-CM | POA: Diagnosis not present

## 2016-10-21 DIAGNOSIS — I69320 Aphasia following cerebral infarction: Secondary | ICD-10-CM | POA: Diagnosis not present

## 2016-10-21 DIAGNOSIS — I679 Cerebrovascular disease, unspecified: Secondary | ICD-10-CM | POA: Diagnosis not present

## 2016-10-21 DIAGNOSIS — I509 Heart failure, unspecified: Secondary | ICD-10-CM | POA: Diagnosis not present

## 2016-10-30 DIAGNOSIS — I509 Heart failure, unspecified: Secondary | ICD-10-CM | POA: Diagnosis not present

## 2016-10-30 DIAGNOSIS — I10 Essential (primary) hypertension: Secondary | ICD-10-CM | POA: Diagnosis not present

## 2016-10-30 DIAGNOSIS — I69320 Aphasia following cerebral infarction: Secondary | ICD-10-CM | POA: Diagnosis not present

## 2016-10-30 DIAGNOSIS — I4891 Unspecified atrial fibrillation: Secondary | ICD-10-CM | POA: Diagnosis not present

## 2016-10-30 DIAGNOSIS — I679 Cerebrovascular disease, unspecified: Secondary | ICD-10-CM | POA: Diagnosis not present

## 2016-10-31 DIAGNOSIS — I69320 Aphasia following cerebral infarction: Secondary | ICD-10-CM | POA: Diagnosis not present

## 2016-10-31 DIAGNOSIS — I509 Heart failure, unspecified: Secondary | ICD-10-CM | POA: Diagnosis not present

## 2016-10-31 DIAGNOSIS — I4891 Unspecified atrial fibrillation: Secondary | ICD-10-CM | POA: Diagnosis not present

## 2016-10-31 DIAGNOSIS — I679 Cerebrovascular disease, unspecified: Secondary | ICD-10-CM | POA: Diagnosis not present

## 2016-10-31 DIAGNOSIS — I10 Essential (primary) hypertension: Secondary | ICD-10-CM | POA: Diagnosis not present

## 2016-11-07 DIAGNOSIS — I10 Essential (primary) hypertension: Secondary | ICD-10-CM | POA: Diagnosis not present

## 2016-11-07 DIAGNOSIS — I69320 Aphasia following cerebral infarction: Secondary | ICD-10-CM | POA: Diagnosis not present

## 2016-11-07 DIAGNOSIS — I679 Cerebrovascular disease, unspecified: Secondary | ICD-10-CM | POA: Diagnosis not present

## 2016-11-07 DIAGNOSIS — I509 Heart failure, unspecified: Secondary | ICD-10-CM | POA: Diagnosis not present

## 2016-11-07 DIAGNOSIS — I4891 Unspecified atrial fibrillation: Secondary | ICD-10-CM | POA: Diagnosis not present

## 2016-11-09 DIAGNOSIS — I69320 Aphasia following cerebral infarction: Secondary | ICD-10-CM | POA: Diagnosis not present

## 2016-11-09 DIAGNOSIS — I10 Essential (primary) hypertension: Secondary | ICD-10-CM | POA: Diagnosis not present

## 2016-11-09 DIAGNOSIS — I679 Cerebrovascular disease, unspecified: Secondary | ICD-10-CM | POA: Diagnosis not present

## 2016-11-09 DIAGNOSIS — I509 Heart failure, unspecified: Secondary | ICD-10-CM | POA: Diagnosis not present

## 2016-11-09 DIAGNOSIS — I4891 Unspecified atrial fibrillation: Secondary | ICD-10-CM | POA: Diagnosis not present

## 2016-11-14 DIAGNOSIS — I4891 Unspecified atrial fibrillation: Secondary | ICD-10-CM | POA: Diagnosis not present

## 2016-11-14 DIAGNOSIS — I69320 Aphasia following cerebral infarction: Secondary | ICD-10-CM | POA: Diagnosis not present

## 2016-11-14 DIAGNOSIS — I10 Essential (primary) hypertension: Secondary | ICD-10-CM | POA: Diagnosis not present

## 2016-11-14 DIAGNOSIS — I679 Cerebrovascular disease, unspecified: Secondary | ICD-10-CM | POA: Diagnosis not present

## 2016-11-14 DIAGNOSIS — I509 Heart failure, unspecified: Secondary | ICD-10-CM | POA: Diagnosis not present

## 2016-11-20 DIAGNOSIS — I10 Essential (primary) hypertension: Secondary | ICD-10-CM | POA: Diagnosis not present

## 2016-11-20 DIAGNOSIS — I509 Heart failure, unspecified: Secondary | ICD-10-CM | POA: Diagnosis not present

## 2016-11-20 DIAGNOSIS — I69320 Aphasia following cerebral infarction: Secondary | ICD-10-CM | POA: Diagnosis not present

## 2016-11-20 DIAGNOSIS — I4891 Unspecified atrial fibrillation: Secondary | ICD-10-CM | POA: Diagnosis not present

## 2016-11-20 DIAGNOSIS — I679 Cerebrovascular disease, unspecified: Secondary | ICD-10-CM | POA: Diagnosis not present

## 2016-11-29 DIAGNOSIS — I679 Cerebrovascular disease, unspecified: Secondary | ICD-10-CM | POA: Diagnosis not present

## 2016-11-29 DIAGNOSIS — I10 Essential (primary) hypertension: Secondary | ICD-10-CM | POA: Diagnosis not present

## 2016-11-29 DIAGNOSIS — I509 Heart failure, unspecified: Secondary | ICD-10-CM | POA: Diagnosis not present

## 2016-11-29 DIAGNOSIS — I69320 Aphasia following cerebral infarction: Secondary | ICD-10-CM | POA: Diagnosis not present

## 2016-11-29 DIAGNOSIS — I4891 Unspecified atrial fibrillation: Secondary | ICD-10-CM | POA: Diagnosis not present

## 2016-12-01 DIAGNOSIS — I509 Heart failure, unspecified: Secondary | ICD-10-CM | POA: Diagnosis not present

## 2016-12-01 DIAGNOSIS — I679 Cerebrovascular disease, unspecified: Secondary | ICD-10-CM | POA: Diagnosis not present

## 2016-12-01 DIAGNOSIS — I4891 Unspecified atrial fibrillation: Secondary | ICD-10-CM | POA: Diagnosis not present

## 2016-12-01 DIAGNOSIS — I10 Essential (primary) hypertension: Secondary | ICD-10-CM | POA: Diagnosis not present

## 2016-12-01 DIAGNOSIS — I69320 Aphasia following cerebral infarction: Secondary | ICD-10-CM | POA: Diagnosis not present

## 2016-12-04 DIAGNOSIS — I69320 Aphasia following cerebral infarction: Secondary | ICD-10-CM | POA: Diagnosis not present

## 2016-12-04 DIAGNOSIS — I509 Heart failure, unspecified: Secondary | ICD-10-CM | POA: Diagnosis not present

## 2016-12-04 DIAGNOSIS — I10 Essential (primary) hypertension: Secondary | ICD-10-CM | POA: Diagnosis not present

## 2016-12-04 DIAGNOSIS — I679 Cerebrovascular disease, unspecified: Secondary | ICD-10-CM | POA: Diagnosis not present

## 2016-12-04 DIAGNOSIS — I4891 Unspecified atrial fibrillation: Secondary | ICD-10-CM | POA: Diagnosis not present

## 2016-12-08 ENCOUNTER — Ambulatory Visit: Payer: Medicare Other | Admitting: Podiatry

## 2016-12-09 DIAGNOSIS — I509 Heart failure, unspecified: Secondary | ICD-10-CM | POA: Diagnosis not present

## 2016-12-09 DIAGNOSIS — I69320 Aphasia following cerebral infarction: Secondary | ICD-10-CM | POA: Diagnosis not present

## 2016-12-09 DIAGNOSIS — I10 Essential (primary) hypertension: Secondary | ICD-10-CM | POA: Diagnosis not present

## 2016-12-09 DIAGNOSIS — I679 Cerebrovascular disease, unspecified: Secondary | ICD-10-CM | POA: Diagnosis not present

## 2016-12-09 DIAGNOSIS — I4891 Unspecified atrial fibrillation: Secondary | ICD-10-CM | POA: Diagnosis not present

## 2016-12-15 ENCOUNTER — Ambulatory Visit (INDEPENDENT_AMBULATORY_CARE_PROVIDER_SITE_OTHER): Payer: Medicare Other | Admitting: *Deleted

## 2016-12-15 ENCOUNTER — Telehealth: Payer: Self-pay | Admitting: Cardiology

## 2016-12-15 DIAGNOSIS — I495 Sick sinus syndrome: Secondary | ICD-10-CM | POA: Diagnosis not present

## 2016-12-15 NOTE — Telephone Encounter (Signed)
LMOVM reminding pt to send remote transmission.   

## 2016-12-16 NOTE — Progress Notes (Signed)
Remote pacemaker transmission.   

## 2016-12-18 DIAGNOSIS — I4891 Unspecified atrial fibrillation: Secondary | ICD-10-CM | POA: Diagnosis not present

## 2016-12-18 DIAGNOSIS — I679 Cerebrovascular disease, unspecified: Secondary | ICD-10-CM | POA: Diagnosis not present

## 2016-12-18 DIAGNOSIS — I69320 Aphasia following cerebral infarction: Secondary | ICD-10-CM | POA: Diagnosis not present

## 2016-12-18 DIAGNOSIS — I509 Heart failure, unspecified: Secondary | ICD-10-CM | POA: Diagnosis not present

## 2016-12-18 DIAGNOSIS — I10 Essential (primary) hypertension: Secondary | ICD-10-CM | POA: Diagnosis not present

## 2016-12-19 DIAGNOSIS — I69320 Aphasia following cerebral infarction: Secondary | ICD-10-CM | POA: Diagnosis not present

## 2016-12-19 DIAGNOSIS — I10 Essential (primary) hypertension: Secondary | ICD-10-CM | POA: Diagnosis not present

## 2016-12-19 DIAGNOSIS — I509 Heart failure, unspecified: Secondary | ICD-10-CM | POA: Diagnosis not present

## 2016-12-19 DIAGNOSIS — I4891 Unspecified atrial fibrillation: Secondary | ICD-10-CM | POA: Diagnosis not present

## 2016-12-19 DIAGNOSIS — I679 Cerebrovascular disease, unspecified: Secondary | ICD-10-CM | POA: Diagnosis not present

## 2016-12-19 LAB — CUP PACEART REMOTE DEVICE CHECK
Battery Impedance: 932 Ohm
Battery Voltage: 2.78 V
Implantable Lead Location: 753860
Lead Channel Impedance Value: 0 Ohm
Lead Channel Impedance Value: 510 Ohm
Lead Channel Setting Pacing Amplitude: 2.5 V
Lead Channel Setting Pacing Pulse Width: 0.4 ms
Lead Channel Setting Sensing Sensitivity: 2 mV
MDC IDC LEAD IMPLANT DT: 20010403
MDC IDC LEAD SERIAL: 204151
MDC IDC MSMT BATTERY REMAINING LONGEVITY: 67 mo
MDC IDC MSMT LEADCHNL RV PACING THRESHOLD AMPLITUDE: 0.875 V
MDC IDC MSMT LEADCHNL RV PACING THRESHOLD PULSEWIDTH: 0.4 ms
MDC IDC MSMT LEADCHNL RV SENSING INTR AMPL: 5.6 mV
MDC IDC PG IMPLANT DT: 20131210
MDC IDC SESS DTM: 20180116200804
MDC IDC STAT BRADY RV PERCENT PACED: 32 %

## 2016-12-22 ENCOUNTER — Encounter: Payer: Self-pay | Admitting: Podiatry

## 2016-12-22 ENCOUNTER — Ambulatory Visit (INDEPENDENT_AMBULATORY_CARE_PROVIDER_SITE_OTHER): Payer: Medicare Other | Admitting: Podiatry

## 2016-12-22 VITALS — BP 160/102 | HR 74

## 2016-12-22 DIAGNOSIS — B351 Tinea unguium: Secondary | ICD-10-CM | POA: Diagnosis not present

## 2016-12-22 DIAGNOSIS — R209 Unspecified disturbances of skin sensation: Secondary | ICD-10-CM

## 2016-12-22 DIAGNOSIS — M79609 Pain in unspecified limb: Secondary | ICD-10-CM | POA: Diagnosis not present

## 2016-12-22 DIAGNOSIS — M79676 Pain in unspecified toe(s): Secondary | ICD-10-CM | POA: Diagnosis not present

## 2016-12-22 DIAGNOSIS — M79673 Pain in unspecified foot: Secondary | ICD-10-CM | POA: Diagnosis not present

## 2016-12-22 DIAGNOSIS — L608 Other nail disorders: Secondary | ICD-10-CM

## 2016-12-22 DIAGNOSIS — I739 Peripheral vascular disease, unspecified: Secondary | ICD-10-CM

## 2016-12-22 DIAGNOSIS — L603 Nail dystrophy: Secondary | ICD-10-CM

## 2016-12-23 ENCOUNTER — Telehealth: Payer: Self-pay | Admitting: *Deleted

## 2016-12-23 DIAGNOSIS — I509 Heart failure, unspecified: Secondary | ICD-10-CM | POA: Diagnosis not present

## 2016-12-23 DIAGNOSIS — I4891 Unspecified atrial fibrillation: Secondary | ICD-10-CM | POA: Diagnosis not present

## 2016-12-23 DIAGNOSIS — I69320 Aphasia following cerebral infarction: Secondary | ICD-10-CM | POA: Diagnosis not present

## 2016-12-23 DIAGNOSIS — I739 Peripheral vascular disease, unspecified: Secondary | ICD-10-CM

## 2016-12-23 DIAGNOSIS — I10 Essential (primary) hypertension: Secondary | ICD-10-CM | POA: Diagnosis not present

## 2016-12-23 DIAGNOSIS — I679 Cerebrovascular disease, unspecified: Secondary | ICD-10-CM | POA: Diagnosis not present

## 2016-12-23 NOTE — Telephone Encounter (Addendum)
-----   Message from Edrick Kins, DPM sent at 12/22/2016  2:50 PM EST ----- Regarding: Referral for vascular Interventional cardiology referral.   Dx : PVD. Blue toes 1-5 bilateral.   Thanks, Dr. Amalia Hailey. 12/23/2016-Referral made to [facility] for PVD, B/L 1-5  Blue toes. 12/24/2016-Faxed referral, demographics and clinicals to [facility].

## 2016-12-23 NOTE — Telephone Encounter (Deleted)
-----   Message from Edrick Kins, DPM sent at 12/22/2016  2:50 PM EST ----- Regarding: Referral for vascular Interventional cardiology referral.   Dx : PVD. Blue toes 1-5 bilateral.   Thanks, Dr. Amalia Hailey

## 2016-12-24 ENCOUNTER — Encounter: Payer: Self-pay | Admitting: Cardiology

## 2016-12-24 NOTE — Telephone Encounter (Signed)
Done. Thanks, Dr. Rehmat Murtagh

## 2016-12-24 NOTE — Progress Notes (Signed)
   SUBJECTIVE Patient  presents to office today complaining of elongated, thickened nails. Pain while ambulating in shoes. Patient is unable to trim their own nails.  Patient is currently wheelchair-bound.  OBJECTIVE General Patient is awake, alert, and oriented x 3 and in no acute distress. Derm Skin is dry and supple bilateral. Negative open lesions or macerations. Remaining integument unremarkable. Nails are tender, long, thickened and dystrophic with subungual debris, consistent with onychomycosis, 1-5 bilateral. No signs of infection noted. Vasc  DP and PT pedal pulses nonpalpable bilaterally. Cold distal extremity digits 1-5 bilateral consistent with peripheral vascular disease. Delayed capillary refill to all digits of the bilateral lower extremities. There is also blue discoloration noted to the digits of the bilateral lower extremities.  Neuro Epicritic and protective threshold sensation diminished bilaterally.  Musculoskeletal Exam No symptomatic pedal deformities noted bilateral. Muscular strength within normal limits.  ASSESSMENT 1. Onychodystrophic nails 1-5 bilateral with hyperkeratosis of nails.  2. Onychomycosis of nail due to dermatophyte bilateral 3. Pain in foot bilateral  4. Peripheral vascular disease bilateral lower extremities 5. Cold distal extremities  PLAN OF CARE 1. Patient evaluated today.  2. Instructed to maintain good pedal hygiene and foot care.  3. Mechanical debridement of nails 1-5 bilaterally performed using a nail nipper. Filed with dremel without incident.  4.  referral for a vascular consult was placed today to evaluate the patient's vascular status and for possible vascular optimization.  5. Return to clinic in 3 mos.    Edrick Kins, DPM [facility]  Dr. Edrick Kins, [address]                                        [place], [place] [address]                Office [phone]  Fax [phone]

## 2016-12-30 DIAGNOSIS — I10 Essential (primary) hypertension: Secondary | ICD-10-CM | POA: Diagnosis not present

## 2016-12-30 DIAGNOSIS — I509 Heart failure, unspecified: Secondary | ICD-10-CM | POA: Diagnosis not present

## 2016-12-30 DIAGNOSIS — I69320 Aphasia following cerebral infarction: Secondary | ICD-10-CM | POA: Diagnosis not present

## 2016-12-30 DIAGNOSIS — I679 Cerebrovascular disease, unspecified: Secondary | ICD-10-CM | POA: Diagnosis not present

## 2016-12-30 DIAGNOSIS — I4891 Unspecified atrial fibrillation: Secondary | ICD-10-CM | POA: Diagnosis not present

## 2016-12-31 DIAGNOSIS — I4891 Unspecified atrial fibrillation: Secondary | ICD-10-CM | POA: Diagnosis not present

## 2016-12-31 DIAGNOSIS — I679 Cerebrovascular disease, unspecified: Secondary | ICD-10-CM | POA: Diagnosis not present

## 2016-12-31 DIAGNOSIS — I69320 Aphasia following cerebral infarction: Secondary | ICD-10-CM | POA: Diagnosis not present

## 2016-12-31 DIAGNOSIS — I509 Heart failure, unspecified: Secondary | ICD-10-CM | POA: Diagnosis not present

## 2016-12-31 DIAGNOSIS — I10 Essential (primary) hypertension: Secondary | ICD-10-CM | POA: Diagnosis not present

## 2017-01-01 DIAGNOSIS — I4891 Unspecified atrial fibrillation: Secondary | ICD-10-CM | POA: Diagnosis not present

## 2017-01-01 DIAGNOSIS — I69320 Aphasia following cerebral infarction: Secondary | ICD-10-CM | POA: Diagnosis not present

## 2017-01-01 DIAGNOSIS — I10 Essential (primary) hypertension: Secondary | ICD-10-CM | POA: Diagnosis not present

## 2017-01-01 DIAGNOSIS — I679 Cerebrovascular disease, unspecified: Secondary | ICD-10-CM | POA: Diagnosis not present

## 2017-01-01 DIAGNOSIS — I509 Heart failure, unspecified: Secondary | ICD-10-CM | POA: Diagnosis not present

## 2017-01-07 DIAGNOSIS — I679 Cerebrovascular disease, unspecified: Secondary | ICD-10-CM | POA: Diagnosis not present

## 2017-01-07 DIAGNOSIS — I10 Essential (primary) hypertension: Secondary | ICD-10-CM | POA: Diagnosis not present

## 2017-01-07 DIAGNOSIS — I69320 Aphasia following cerebral infarction: Secondary | ICD-10-CM | POA: Diagnosis not present

## 2017-01-07 DIAGNOSIS — I4891 Unspecified atrial fibrillation: Secondary | ICD-10-CM | POA: Diagnosis not present

## 2017-01-07 DIAGNOSIS — I509 Heart failure, unspecified: Secondary | ICD-10-CM | POA: Diagnosis not present

## 2017-01-12 DIAGNOSIS — I69320 Aphasia following cerebral infarction: Secondary | ICD-10-CM | POA: Diagnosis not present

## 2017-01-12 DIAGNOSIS — I679 Cerebrovascular disease, unspecified: Secondary | ICD-10-CM | POA: Diagnosis not present

## 2017-01-12 DIAGNOSIS — I509 Heart failure, unspecified: Secondary | ICD-10-CM | POA: Diagnosis not present

## 2017-01-12 DIAGNOSIS — I4891 Unspecified atrial fibrillation: Secondary | ICD-10-CM | POA: Diagnosis not present

## 2017-01-12 DIAGNOSIS — I10 Essential (primary) hypertension: Secondary | ICD-10-CM | POA: Diagnosis not present

## 2017-01-15 DIAGNOSIS — I509 Heart failure, unspecified: Secondary | ICD-10-CM | POA: Diagnosis not present

## 2017-01-15 DIAGNOSIS — I69320 Aphasia following cerebral infarction: Secondary | ICD-10-CM | POA: Diagnosis not present

## 2017-01-15 DIAGNOSIS — I4891 Unspecified atrial fibrillation: Secondary | ICD-10-CM | POA: Diagnosis not present

## 2017-01-15 DIAGNOSIS — I10 Essential (primary) hypertension: Secondary | ICD-10-CM | POA: Diagnosis not present

## 2017-01-15 DIAGNOSIS — I679 Cerebrovascular disease, unspecified: Secondary | ICD-10-CM | POA: Diagnosis not present

## 2017-01-16 DIAGNOSIS — I679 Cerebrovascular disease, unspecified: Secondary | ICD-10-CM | POA: Diagnosis not present

## 2017-01-16 DIAGNOSIS — I4891 Unspecified atrial fibrillation: Secondary | ICD-10-CM | POA: Diagnosis not present

## 2017-01-16 DIAGNOSIS — I10 Essential (primary) hypertension: Secondary | ICD-10-CM | POA: Diagnosis not present

## 2017-01-16 DIAGNOSIS — I69320 Aphasia following cerebral infarction: Secondary | ICD-10-CM | POA: Diagnosis not present

## 2017-01-16 DIAGNOSIS — I509 Heart failure, unspecified: Secondary | ICD-10-CM | POA: Diagnosis not present

## 2017-01-19 DIAGNOSIS — I10 Essential (primary) hypertension: Secondary | ICD-10-CM | POA: Diagnosis not present

## 2017-01-19 DIAGNOSIS — I679 Cerebrovascular disease, unspecified: Secondary | ICD-10-CM | POA: Diagnosis not present

## 2017-01-19 DIAGNOSIS — I509 Heart failure, unspecified: Secondary | ICD-10-CM | POA: Diagnosis not present

## 2017-01-19 DIAGNOSIS — I69320 Aphasia following cerebral infarction: Secondary | ICD-10-CM | POA: Diagnosis not present

## 2017-01-19 DIAGNOSIS — I4891 Unspecified atrial fibrillation: Secondary | ICD-10-CM | POA: Diagnosis not present

## 2017-01-27 DIAGNOSIS — I10 Essential (primary) hypertension: Secondary | ICD-10-CM | POA: Diagnosis not present

## 2017-01-27 DIAGNOSIS — I679 Cerebrovascular disease, unspecified: Secondary | ICD-10-CM | POA: Diagnosis not present

## 2017-01-27 DIAGNOSIS — I509 Heart failure, unspecified: Secondary | ICD-10-CM | POA: Diagnosis not present

## 2017-01-27 DIAGNOSIS — I4891 Unspecified atrial fibrillation: Secondary | ICD-10-CM | POA: Diagnosis not present

## 2017-01-27 DIAGNOSIS — I69320 Aphasia following cerebral infarction: Secondary | ICD-10-CM | POA: Diagnosis not present

## 2017-01-29 DIAGNOSIS — I69320 Aphasia following cerebral infarction: Secondary | ICD-10-CM | POA: Diagnosis not present

## 2017-01-29 DIAGNOSIS — I10 Essential (primary) hypertension: Secondary | ICD-10-CM | POA: Diagnosis not present

## 2017-01-29 DIAGNOSIS — I679 Cerebrovascular disease, unspecified: Secondary | ICD-10-CM | POA: Diagnosis not present

## 2017-01-29 DIAGNOSIS — I4891 Unspecified atrial fibrillation: Secondary | ICD-10-CM | POA: Diagnosis not present

## 2017-01-29 DIAGNOSIS — I509 Heart failure, unspecified: Secondary | ICD-10-CM | POA: Diagnosis not present

## 2017-01-31 DIAGNOSIS — I4891 Unspecified atrial fibrillation: Secondary | ICD-10-CM | POA: Diagnosis not present

## 2017-01-31 DIAGNOSIS — I679 Cerebrovascular disease, unspecified: Secondary | ICD-10-CM | POA: Diagnosis not present

## 2017-01-31 DIAGNOSIS — I10 Essential (primary) hypertension: Secondary | ICD-10-CM | POA: Diagnosis not present

## 2017-01-31 DIAGNOSIS — I509 Heart failure, unspecified: Secondary | ICD-10-CM | POA: Diagnosis not present

## 2017-01-31 DIAGNOSIS — I69320 Aphasia following cerebral infarction: Secondary | ICD-10-CM | POA: Diagnosis not present

## 2017-02-05 DIAGNOSIS — I509 Heart failure, unspecified: Secondary | ICD-10-CM | POA: Diagnosis not present

## 2017-02-05 DIAGNOSIS — I679 Cerebrovascular disease, unspecified: Secondary | ICD-10-CM | POA: Diagnosis not present

## 2017-02-05 DIAGNOSIS — I4891 Unspecified atrial fibrillation: Secondary | ICD-10-CM | POA: Diagnosis not present

## 2017-02-05 DIAGNOSIS — I10 Essential (primary) hypertension: Secondary | ICD-10-CM | POA: Diagnosis not present

## 2017-02-05 DIAGNOSIS — I69320 Aphasia following cerebral infarction: Secondary | ICD-10-CM | POA: Diagnosis not present

## 2017-02-12 DIAGNOSIS — I10 Essential (primary) hypertension: Secondary | ICD-10-CM | POA: Diagnosis not present

## 2017-02-12 DIAGNOSIS — I679 Cerebrovascular disease, unspecified: Secondary | ICD-10-CM | POA: Diagnosis not present

## 2017-02-12 DIAGNOSIS — I69320 Aphasia following cerebral infarction: Secondary | ICD-10-CM | POA: Diagnosis not present

## 2017-02-12 DIAGNOSIS — I4891 Unspecified atrial fibrillation: Secondary | ICD-10-CM | POA: Diagnosis not present

## 2017-02-12 DIAGNOSIS — I509 Heart failure, unspecified: Secondary | ICD-10-CM | POA: Diagnosis not present

## 2017-02-16 DIAGNOSIS — I679 Cerebrovascular disease, unspecified: Secondary | ICD-10-CM | POA: Diagnosis not present

## 2017-02-16 DIAGNOSIS — N390 Urinary tract infection, site not specified: Secondary | ICD-10-CM | POA: Diagnosis not present

## 2017-02-16 DIAGNOSIS — I69320 Aphasia following cerebral infarction: Secondary | ICD-10-CM | POA: Diagnosis not present

## 2017-02-16 DIAGNOSIS — I509 Heart failure, unspecified: Secondary | ICD-10-CM | POA: Diagnosis not present

## 2017-02-16 DIAGNOSIS — I4891 Unspecified atrial fibrillation: Secondary | ICD-10-CM | POA: Diagnosis not present

## 2017-02-16 DIAGNOSIS — I10 Essential (primary) hypertension: Secondary | ICD-10-CM | POA: Diagnosis not present

## 2017-02-18 DIAGNOSIS — I509 Heart failure, unspecified: Secondary | ICD-10-CM | POA: Diagnosis not present

## 2017-02-18 DIAGNOSIS — I4891 Unspecified atrial fibrillation: Secondary | ICD-10-CM | POA: Diagnosis not present

## 2017-02-18 DIAGNOSIS — I69320 Aphasia following cerebral infarction: Secondary | ICD-10-CM | POA: Diagnosis not present

## 2017-02-18 DIAGNOSIS — I10 Essential (primary) hypertension: Secondary | ICD-10-CM | POA: Diagnosis not present

## 2017-02-18 DIAGNOSIS — I679 Cerebrovascular disease, unspecified: Secondary | ICD-10-CM | POA: Diagnosis not present

## 2017-02-23 DIAGNOSIS — I4891 Unspecified atrial fibrillation: Secondary | ICD-10-CM | POA: Diagnosis not present

## 2017-02-23 DIAGNOSIS — I509 Heart failure, unspecified: Secondary | ICD-10-CM | POA: Diagnosis not present

## 2017-02-23 DIAGNOSIS — I10 Essential (primary) hypertension: Secondary | ICD-10-CM | POA: Diagnosis not present

## 2017-02-23 DIAGNOSIS — I69320 Aphasia following cerebral infarction: Secondary | ICD-10-CM | POA: Diagnosis not present

## 2017-02-23 DIAGNOSIS — I679 Cerebrovascular disease, unspecified: Secondary | ICD-10-CM | POA: Diagnosis not present

## 2017-02-26 DIAGNOSIS — I679 Cerebrovascular disease, unspecified: Secondary | ICD-10-CM | POA: Diagnosis not present

## 2017-02-26 DIAGNOSIS — I509 Heart failure, unspecified: Secondary | ICD-10-CM | POA: Diagnosis not present

## 2017-02-26 DIAGNOSIS — I4891 Unspecified atrial fibrillation: Secondary | ICD-10-CM | POA: Diagnosis not present

## 2017-02-26 DIAGNOSIS — I69320 Aphasia following cerebral infarction: Secondary | ICD-10-CM | POA: Diagnosis not present

## 2017-02-26 DIAGNOSIS — I10 Essential (primary) hypertension: Secondary | ICD-10-CM | POA: Diagnosis not present

## 2017-03-01 DIAGNOSIS — I4891 Unspecified atrial fibrillation: Secondary | ICD-10-CM | POA: Diagnosis not present

## 2017-03-01 DIAGNOSIS — I679 Cerebrovascular disease, unspecified: Secondary | ICD-10-CM | POA: Diagnosis not present

## 2017-03-01 DIAGNOSIS — I509 Heart failure, unspecified: Secondary | ICD-10-CM | POA: Diagnosis not present

## 2017-03-01 DIAGNOSIS — I10 Essential (primary) hypertension: Secondary | ICD-10-CM | POA: Diagnosis not present

## 2017-03-01 DIAGNOSIS — I69320 Aphasia following cerebral infarction: Secondary | ICD-10-CM | POA: Diagnosis not present

## 2017-03-02 DIAGNOSIS — I679 Cerebrovascular disease, unspecified: Secondary | ICD-10-CM | POA: Diagnosis not present

## 2017-03-02 DIAGNOSIS — I4891 Unspecified atrial fibrillation: Secondary | ICD-10-CM | POA: Diagnosis not present

## 2017-03-02 DIAGNOSIS — I69320 Aphasia following cerebral infarction: Secondary | ICD-10-CM | POA: Diagnosis not present

## 2017-03-02 DIAGNOSIS — I10 Essential (primary) hypertension: Secondary | ICD-10-CM | POA: Diagnosis not present

## 2017-03-02 DIAGNOSIS — I509 Heart failure, unspecified: Secondary | ICD-10-CM | POA: Diagnosis not present

## 2017-03-03 DIAGNOSIS — I10 Essential (primary) hypertension: Secondary | ICD-10-CM | POA: Diagnosis not present

## 2017-03-03 DIAGNOSIS — I679 Cerebrovascular disease, unspecified: Secondary | ICD-10-CM | POA: Diagnosis not present

## 2017-03-03 DIAGNOSIS — I4891 Unspecified atrial fibrillation: Secondary | ICD-10-CM | POA: Diagnosis not present

## 2017-03-03 DIAGNOSIS — I69320 Aphasia following cerebral infarction: Secondary | ICD-10-CM | POA: Diagnosis not present

## 2017-03-03 DIAGNOSIS — I509 Heart failure, unspecified: Secondary | ICD-10-CM | POA: Diagnosis not present

## 2017-03-10 DIAGNOSIS — I4891 Unspecified atrial fibrillation: Secondary | ICD-10-CM | POA: Diagnosis not present

## 2017-03-10 DIAGNOSIS — I69320 Aphasia following cerebral infarction: Secondary | ICD-10-CM | POA: Diagnosis not present

## 2017-03-10 DIAGNOSIS — I509 Heart failure, unspecified: Secondary | ICD-10-CM | POA: Diagnosis not present

## 2017-03-10 DIAGNOSIS — I10 Essential (primary) hypertension: Secondary | ICD-10-CM | POA: Diagnosis not present

## 2017-03-10 DIAGNOSIS — I679 Cerebrovascular disease, unspecified: Secondary | ICD-10-CM | POA: Diagnosis not present

## 2017-03-12 DIAGNOSIS — I10 Essential (primary) hypertension: Secondary | ICD-10-CM | POA: Diagnosis not present

## 2017-03-12 DIAGNOSIS — I69320 Aphasia following cerebral infarction: Secondary | ICD-10-CM | POA: Diagnosis not present

## 2017-03-12 DIAGNOSIS — I679 Cerebrovascular disease, unspecified: Secondary | ICD-10-CM | POA: Diagnosis not present

## 2017-03-12 DIAGNOSIS — I4891 Unspecified atrial fibrillation: Secondary | ICD-10-CM | POA: Diagnosis not present

## 2017-03-12 DIAGNOSIS — I509 Heart failure, unspecified: Secondary | ICD-10-CM | POA: Diagnosis not present

## 2017-03-16 DIAGNOSIS — I509 Heart failure, unspecified: Secondary | ICD-10-CM | POA: Diagnosis not present

## 2017-03-16 DIAGNOSIS — I4891 Unspecified atrial fibrillation: Secondary | ICD-10-CM | POA: Diagnosis not present

## 2017-03-16 DIAGNOSIS — I679 Cerebrovascular disease, unspecified: Secondary | ICD-10-CM | POA: Diagnosis not present

## 2017-03-16 DIAGNOSIS — I69320 Aphasia following cerebral infarction: Secondary | ICD-10-CM | POA: Diagnosis not present

## 2017-03-16 DIAGNOSIS — I10 Essential (primary) hypertension: Secondary | ICD-10-CM | POA: Diagnosis not present

## 2017-03-17 ENCOUNTER — Ambulatory Visit (INDEPENDENT_AMBULATORY_CARE_PROVIDER_SITE_OTHER): Payer: Medicare Other | Admitting: *Deleted

## 2017-03-17 DIAGNOSIS — I495 Sick sinus syndrome: Secondary | ICD-10-CM | POA: Diagnosis not present

## 2017-03-17 NOTE — Progress Notes (Signed)
Remote pacemaker transmission.   

## 2017-03-19 DIAGNOSIS — I679 Cerebrovascular disease, unspecified: Secondary | ICD-10-CM | POA: Diagnosis not present

## 2017-03-19 DIAGNOSIS — I69320 Aphasia following cerebral infarction: Secondary | ICD-10-CM | POA: Diagnosis not present

## 2017-03-19 DIAGNOSIS — I509 Heart failure, unspecified: Secondary | ICD-10-CM | POA: Diagnosis not present

## 2017-03-19 DIAGNOSIS — I4891 Unspecified atrial fibrillation: Secondary | ICD-10-CM | POA: Diagnosis not present

## 2017-03-19 DIAGNOSIS — I10 Essential (primary) hypertension: Secondary | ICD-10-CM | POA: Diagnosis not present

## 2017-03-19 LAB — CUP PACEART REMOTE DEVICE CHECK
Battery Impedance: 1062 Ohm
Battery Voltage: 2.77 V
Brady Statistic RV Percent Paced: 33 %
Implantable Lead Implant Date: 20010403
Implantable Lead Location: 753860
Implantable Lead Serial Number: 204151
Implantable Pulse Generator Implant Date: 20131210
Lead Channel Impedance Value: 525 Ohm
Lead Channel Pacing Threshold Pulse Width: 0.4 ms
Lead Channel Setting Pacing Pulse Width: 0.4 ms
Lead Channel Setting Sensing Sensitivity: 2 mV
MDC IDC MSMT BATTERY REMAINING LONGEVITY: 63 mo
MDC IDC MSMT LEADCHNL RA IMPEDANCE VALUE: 0 Ohm
MDC IDC MSMT LEADCHNL RV PACING THRESHOLD AMPLITUDE: 0.75 V
MDC IDC SESS DTM: 20180417135517
MDC IDC SET LEADCHNL RV PACING AMPLITUDE: 2.5 V

## 2017-03-20 ENCOUNTER — Encounter: Payer: Self-pay | Admitting: Cardiology

## 2017-03-23 ENCOUNTER — Ambulatory Visit (INDEPENDENT_AMBULATORY_CARE_PROVIDER_SITE_OTHER): Payer: Medicare Other | Admitting: Podiatry

## 2017-03-23 DIAGNOSIS — L603 Nail dystrophy: Secondary | ICD-10-CM

## 2017-03-23 DIAGNOSIS — B351 Tinea unguium: Secondary | ICD-10-CM

## 2017-03-23 DIAGNOSIS — M79609 Pain in unspecified limb: Secondary | ICD-10-CM | POA: Diagnosis not present

## 2017-03-23 DIAGNOSIS — L608 Other nail disorders: Secondary | ICD-10-CM

## 2017-03-24 DIAGNOSIS — I69320 Aphasia following cerebral infarction: Secondary | ICD-10-CM | POA: Diagnosis not present

## 2017-03-24 DIAGNOSIS — I4891 Unspecified atrial fibrillation: Secondary | ICD-10-CM | POA: Diagnosis not present

## 2017-03-24 DIAGNOSIS — I10 Essential (primary) hypertension: Secondary | ICD-10-CM | POA: Diagnosis not present

## 2017-03-24 DIAGNOSIS — I509 Heart failure, unspecified: Secondary | ICD-10-CM | POA: Diagnosis not present

## 2017-03-24 DIAGNOSIS — I679 Cerebrovascular disease, unspecified: Secondary | ICD-10-CM | POA: Diagnosis not present

## 2017-03-24 NOTE — Progress Notes (Signed)
   SUBJECTIVE Patient  presents to office today complaining of elongated, thickened nails. Pain while ambulating in shoes. Patient is unable to trim their own nails.   OBJECTIVE General Patient is awake, alert, and oriented x 3 and in no acute distress. Derm Skin is dry and supple bilateral. Negative open lesions or macerations. Remaining integument unremarkable. Nails are tender, long, thickened and dystrophic with subungual debris, consistent with onychomycosis, 1-5 bilateral. No signs of infection noted. Vasc  DP and PT pedal pulses palpable bilaterally. Temperature gradient within normal limits.  Neuro Epicritic and protective threshold sensation diminished bilaterally.  Musculoskeletal Exam No symptomatic pedal deformities noted bilateral. Muscular strength within normal limits.  ASSESSMENT 1. Onychodystrophic nails 1-5 bilateral with hyperkeratosis of nails.  2. Onychomycosis of nail due to dermatophyte bilateral 3. Pain in foot bilateral  PLAN OF CARE 1. Patient evaluated today.  2. Instructed to maintain good pedal hygiene and foot care.  3. Mechanical debridement of nails 1-5 bilaterally performed using a nail nipper. Filed with dremel without incident.  4. Return to clinic in 3 mos.    Brent M. Evans, DPM [facility]  Dr. Brent M. Evans, DPM    [address]                                        [place], [place] [address]                Office [phone]  Fax [phone]      

## 2017-03-30 DIAGNOSIS — I69320 Aphasia following cerebral infarction: Secondary | ICD-10-CM | POA: Diagnosis not present

## 2017-03-30 DIAGNOSIS — I679 Cerebrovascular disease, unspecified: Secondary | ICD-10-CM | POA: Diagnosis not present

## 2017-03-30 DIAGNOSIS — I10 Essential (primary) hypertension: Secondary | ICD-10-CM | POA: Diagnosis not present

## 2017-03-30 DIAGNOSIS — I4891 Unspecified atrial fibrillation: Secondary | ICD-10-CM | POA: Diagnosis not present

## 2017-03-30 DIAGNOSIS — I509 Heart failure, unspecified: Secondary | ICD-10-CM | POA: Diagnosis not present

## 2017-03-31 DIAGNOSIS — I10 Essential (primary) hypertension: Secondary | ICD-10-CM | POA: Diagnosis not present

## 2017-03-31 DIAGNOSIS — I4891 Unspecified atrial fibrillation: Secondary | ICD-10-CM | POA: Diagnosis not present

## 2017-03-31 DIAGNOSIS — I679 Cerebrovascular disease, unspecified: Secondary | ICD-10-CM | POA: Diagnosis not present

## 2017-03-31 DIAGNOSIS — I69320 Aphasia following cerebral infarction: Secondary | ICD-10-CM | POA: Diagnosis not present

## 2017-03-31 DIAGNOSIS — I509 Heart failure, unspecified: Secondary | ICD-10-CM | POA: Diagnosis not present

## 2017-04-06 DIAGNOSIS — I69320 Aphasia following cerebral infarction: Secondary | ICD-10-CM | POA: Diagnosis not present

## 2017-04-06 DIAGNOSIS — I679 Cerebrovascular disease, unspecified: Secondary | ICD-10-CM | POA: Diagnosis not present

## 2017-04-06 DIAGNOSIS — I509 Heart failure, unspecified: Secondary | ICD-10-CM | POA: Diagnosis not present

## 2017-04-06 DIAGNOSIS — I4891 Unspecified atrial fibrillation: Secondary | ICD-10-CM | POA: Diagnosis not present

## 2017-04-06 DIAGNOSIS — I10 Essential (primary) hypertension: Secondary | ICD-10-CM | POA: Diagnosis not present

## 2017-04-08 DIAGNOSIS — I509 Heart failure, unspecified: Secondary | ICD-10-CM | POA: Diagnosis not present

## 2017-04-08 DIAGNOSIS — I10 Essential (primary) hypertension: Secondary | ICD-10-CM | POA: Diagnosis not present

## 2017-04-08 DIAGNOSIS — I69320 Aphasia following cerebral infarction: Secondary | ICD-10-CM | POA: Diagnosis not present

## 2017-04-08 DIAGNOSIS — I4891 Unspecified atrial fibrillation: Secondary | ICD-10-CM | POA: Diagnosis not present

## 2017-04-08 DIAGNOSIS — I679 Cerebrovascular disease, unspecified: Secondary | ICD-10-CM | POA: Diagnosis not present

## 2017-04-11 DIAGNOSIS — I10 Essential (primary) hypertension: Secondary | ICD-10-CM | POA: Diagnosis not present

## 2017-04-11 DIAGNOSIS — I679 Cerebrovascular disease, unspecified: Secondary | ICD-10-CM | POA: Diagnosis not present

## 2017-04-11 DIAGNOSIS — I4891 Unspecified atrial fibrillation: Secondary | ICD-10-CM | POA: Diagnosis not present

## 2017-04-11 DIAGNOSIS — I69320 Aphasia following cerebral infarction: Secondary | ICD-10-CM | POA: Diagnosis not present

## 2017-04-11 DIAGNOSIS — I509 Heart failure, unspecified: Secondary | ICD-10-CM | POA: Diagnosis not present

## 2017-04-14 DIAGNOSIS — I679 Cerebrovascular disease, unspecified: Secondary | ICD-10-CM | POA: Diagnosis not present

## 2017-04-14 DIAGNOSIS — I10 Essential (primary) hypertension: Secondary | ICD-10-CM | POA: Diagnosis not present

## 2017-04-14 DIAGNOSIS — I4891 Unspecified atrial fibrillation: Secondary | ICD-10-CM | POA: Diagnosis not present

## 2017-04-14 DIAGNOSIS — I509 Heart failure, unspecified: Secondary | ICD-10-CM | POA: Diagnosis not present

## 2017-04-14 DIAGNOSIS — I69320 Aphasia following cerebral infarction: Secondary | ICD-10-CM | POA: Diagnosis not present

## 2017-04-23 DIAGNOSIS — I509 Heart failure, unspecified: Secondary | ICD-10-CM | POA: Diagnosis not present

## 2017-04-23 DIAGNOSIS — I10 Essential (primary) hypertension: Secondary | ICD-10-CM | POA: Diagnosis not present

## 2017-04-23 DIAGNOSIS — I69320 Aphasia following cerebral infarction: Secondary | ICD-10-CM | POA: Diagnosis not present

## 2017-04-23 DIAGNOSIS — I679 Cerebrovascular disease, unspecified: Secondary | ICD-10-CM | POA: Diagnosis not present

## 2017-04-23 DIAGNOSIS — I4891 Unspecified atrial fibrillation: Secondary | ICD-10-CM | POA: Diagnosis not present

## 2017-06-16 ENCOUNTER — Ambulatory Visit (INDEPENDENT_AMBULATORY_CARE_PROVIDER_SITE_OTHER): Payer: Medicare Other | Admitting: *Deleted

## 2017-06-16 ENCOUNTER — Telehealth: Payer: Self-pay | Admitting: Cardiology

## 2017-06-16 DIAGNOSIS — I495 Sick sinus syndrome: Secondary | ICD-10-CM

## 2017-06-16 NOTE — Telephone Encounter (Signed)
Confirmed remote transmission w/ pt daughter.   

## 2017-06-17 ENCOUNTER — Encounter: Payer: Self-pay | Admitting: Cardiology

## 2017-06-17 NOTE — Progress Notes (Signed)
Remote pacemaker transmission.   

## 2017-06-18 LAB — CUP PACEART REMOTE DEVICE CHECK
Battery Impedance: 1169 Ohm
Battery Remaining Longevity: 59 mo
Battery Voltage: 2.77 V
Date Time Interrogation Session: 20180718153429
Implantable Lead Implant Date: 20010403
Implantable Lead Location: 753860
Implantable Lead Model: 4463
Implantable Lead Serial Number: 204151
Implantable Pulse Generator Implant Date: 20131210
Lead Channel Pacing Threshold Pulse Width: 0.4 ms
Lead Channel Setting Sensing Sensitivity: 2 mV
MDC IDC MSMT LEADCHNL RA IMPEDANCE VALUE: 0 Ohm
MDC IDC MSMT LEADCHNL RV IMPEDANCE VALUE: 531 Ohm
MDC IDC MSMT LEADCHNL RV PACING THRESHOLD AMPLITUDE: 0.875 V
MDC IDC SET LEADCHNL RV PACING AMPLITUDE: 2.5 V
MDC IDC SET LEADCHNL RV PACING PULSEWIDTH: 0.4 ms
MDC IDC STAT BRADY RV PERCENT PACED: 34 %

## 2017-06-19 ENCOUNTER — Other Ambulatory Visit: Payer: Self-pay

## 2017-06-19 MED ORDER — METOPROLOL TARTRATE 50 MG PO TABS: 50.0000 mg | ORAL_TABLET | Freq: Two times a day (BID) | ORAL | 0 refills | Status: DC

## 2017-07-27 ENCOUNTER — Ambulatory Visit: Payer: Medicare Other | Admitting: Podiatry

## 2017-07-27 DIAGNOSIS — I4891 Unspecified atrial fibrillation: Secondary | ICD-10-CM | POA: Diagnosis not present

## 2017-07-27 DIAGNOSIS — F322 Major depressive disorder, single episode, severe without psychotic features: Secondary | ICD-10-CM | POA: Diagnosis not present

## 2017-07-27 DIAGNOSIS — E039 Hypothyroidism, unspecified: Secondary | ICD-10-CM | POA: Diagnosis not present

## 2017-07-27 DIAGNOSIS — I69320 Aphasia following cerebral infarction: Secondary | ICD-10-CM | POA: Diagnosis not present

## 2017-07-27 DIAGNOSIS — Z23 Encounter for immunization: Secondary | ICD-10-CM | POA: Diagnosis not present

## 2017-07-27 DIAGNOSIS — I639 Cerebral infarction, unspecified: Secondary | ICD-10-CM | POA: Diagnosis not present

## 2017-07-27 DIAGNOSIS — M4004 Postural kyphosis, thoracic region: Secondary | ICD-10-CM | POA: Diagnosis not present

## 2017-07-27 DIAGNOSIS — Z79899 Other long term (current) drug therapy: Secondary | ICD-10-CM | POA: Diagnosis not present

## 2017-08-10 ENCOUNTER — Ambulatory Visit: Payer: Medicare Other | Admitting: Podiatry

## 2017-09-07 ENCOUNTER — Ambulatory Visit (INDEPENDENT_AMBULATORY_CARE_PROVIDER_SITE_OTHER): Payer: Medicare Other | Admitting: Podiatry

## 2017-09-07 ENCOUNTER — Encounter: Payer: Self-pay | Admitting: Podiatry

## 2017-09-07 DIAGNOSIS — B351 Tinea unguium: Secondary | ICD-10-CM | POA: Diagnosis not present

## 2017-09-07 DIAGNOSIS — M79609 Pain in unspecified limb: Secondary | ICD-10-CM

## 2017-09-08 NOTE — Progress Notes (Signed)
   SUBJECTIVE Patient presents to office today complaining of elongated, thickened nails. Pain while ambulating in shoes. Patient is unable to trim their own nails.     Past Medical History:  Diagnosis Date  . Anxiety   . Atrial fibrillation ([place])   . Bradycardia    s/p PPM  . Diverticulitis    history of  . Dizziness and giddiness 07/22/2013  . DVT (deep vein thrombosis) in pregnancy ([place])    right peroneal  . History of hysterectomy   . HTN (hypertension)   . Hypothyroidism   . Long-term (current) use of anticoagulants   . Pelvis fracture ([place]) 05-24-14  . Permanent atrial fibrillation ([place])   . Scoliosis (and kyphoscoliosis), idiopathic   . Stroke [facility])     OBJECTIVE General Patient is awake, alert, and oriented x 3 and in no acute distress. Derm Skin is dry and supple bilateral. Negative open lesions or macerations. Remaining integument unremarkable. Nails are tender, long, thickened and dystrophic with subungual debris, consistent with onychomycosis, 1-5 bilateral. No signs of infection noted. Vasc  DP and PT pedal pulses diminished bilaterally. Delayed capillary refill with bluish discoloration to the bilateral forefoot.  Neuro Epicritic and protective threshold sensation diminished bilaterally.  Musculoskeletal Exam No symptomatic pedal deformities noted bilateral. Muscular strength within normal limits.  ASSESSMENT 1. Onychodystrophic nails 1-5 bilateral with hyperkeratosis of nails.  2. Onychomycosis of nail due to dermatophyte bilateral 3. Pain in foot bilateral 4. Non symptomatic PVD  PLAN OF CARE 1. Patient evaluated today.  2. Instructed to maintain good pedal hygiene and foot care.  3. Mechanical debridement of nails 1-5 bilaterally performed using a nail nipper. Filed with dremel without incident.  4. Return to clinic when necessary.   Daughter has [place] walkers (horses).  Edrick Kins, DPM [facility]  Dr. Edrick Kins, [address]                                        [place], [place] [address]                Office [phone]  Fax [phone]

## 2017-09-16 ENCOUNTER — Telehealth: Payer: Self-pay | Admitting: Cardiology

## 2017-09-16 ENCOUNTER — Ambulatory Visit (INDEPENDENT_AMBULATORY_CARE_PROVIDER_SITE_OTHER): Payer: Medicare Other | Admitting: *Deleted

## 2017-09-16 DIAGNOSIS — I495 Sick sinus syndrome: Secondary | ICD-10-CM | POA: Diagnosis not present

## 2017-09-16 NOTE — Telephone Encounter (Signed)
LMOVM reminding pt to send remote transmission.   

## 2017-09-17 NOTE — Progress Notes (Signed)
Remote pacemaker transmission.   

## 2017-09-18 ENCOUNTER — Encounter: Payer: Self-pay | Admitting: Cardiology

## 2017-10-07 LAB — CUP PACEART REMOTE DEVICE CHECK
Battery Impedance: 1276 Ohm
Brady Statistic RV Percent Paced: 34 %
Implantable Lead Implant Date: 20010403
Implantable Lead Location: 753860
Implantable Lead Model: 4463
Lead Channel Impedance Value: 482 Ohm
Lead Channel Setting Pacing Pulse Width: 0.4 ms
Lead Channel Setting Sensing Sensitivity: 2 mV
MDC IDC LEAD SERIAL: 204151
MDC IDC MSMT BATTERY REMAINING LONGEVITY: 56 mo
MDC IDC MSMT BATTERY VOLTAGE: 2.77 V
MDC IDC MSMT LEADCHNL RA IMPEDANCE VALUE: 0 Ohm
MDC IDC MSMT LEADCHNL RV PACING THRESHOLD AMPLITUDE: 0.75 V
MDC IDC MSMT LEADCHNL RV PACING THRESHOLD PULSEWIDTH: 0.4 ms
MDC IDC PG IMPLANT DT: 20131210
MDC IDC SESS DTM: 20181017185158
MDC IDC SET LEADCHNL RV PACING AMPLITUDE: 2.5 V

## 2017-10-21 ENCOUNTER — Other Ambulatory Visit: Payer: Self-pay | Admitting: Cardiology

## 2017-10-21 NOTE — Telephone Encounter (Signed)
REFILL 

## 2017-12-15 DIAGNOSIS — R4182 Altered mental status, unspecified: Secondary | ICD-10-CM | POA: Diagnosis not present

## 2017-12-15 DIAGNOSIS — N390 Urinary tract infection, site not specified: Secondary | ICD-10-CM | POA: Diagnosis not present

## 2017-12-16 ENCOUNTER — Telehealth: Payer: Self-pay | Admitting: Cardiology

## 2017-12-16 ENCOUNTER — Ambulatory Visit (INDEPENDENT_AMBULATORY_CARE_PROVIDER_SITE_OTHER): Payer: Medicare Other | Admitting: *Deleted

## 2017-12-16 DIAGNOSIS — I495 Sick sinus syndrome: Secondary | ICD-10-CM

## 2017-12-16 NOTE — Telephone Encounter (Signed)
LMOVM reminding pt to send remote transmission.   

## 2017-12-17 NOTE — Progress Notes (Signed)
Remote pacemaker transmission.   

## 2017-12-18 ENCOUNTER — Encounter: Payer: Self-pay | Admitting: Cardiology

## 2017-12-25 LAB — CUP PACEART REMOTE DEVICE CHECK
Battery Impedance: 1384 Ohm
Brady Statistic RV Percent Paced: 32 %
Implantable Lead Implant Date: 20010403
Implantable Lead Model: 4463
Implantable Pulse Generator Implant Date: 20131210
Lead Channel Impedance Value: 488 Ohm
Lead Channel Pacing Threshold Amplitude: 0.5 V
Lead Channel Setting Pacing Amplitude: 2.5 V
Lead Channel Setting Pacing Pulse Width: 0.4 ms
Lead Channel Setting Sensing Sensitivity: 2 mV
MDC IDC LEAD LOCATION: 753860
MDC IDC LEAD SERIAL: 204151
MDC IDC MSMT BATTERY REMAINING LONGEVITY: 53 mo
MDC IDC MSMT BATTERY VOLTAGE: 2.77 V
MDC IDC MSMT LEADCHNL RA IMPEDANCE VALUE: 0 Ohm
MDC IDC MSMT LEADCHNL RV PACING THRESHOLD PULSEWIDTH: 0.4 ms
MDC IDC SESS DTM: 20190116220729

## 2018-01-12 DIAGNOSIS — N390 Urinary tract infection, site not specified: Secondary | ICD-10-CM | POA: Diagnosis not present

## 2018-01-18 ENCOUNTER — Other Ambulatory Visit: Payer: Self-pay

## 2018-01-18 MED ORDER — METOPROLOL TARTRATE 50 MG PO TABS: 50.0000 mg | ORAL_TABLET | Freq: Two times a day (BID) | ORAL | 3 refills | Status: AC

## 2018-03-04 DIAGNOSIS — I639 Cerebral infarction, unspecified: Secondary | ICD-10-CM | POA: Diagnosis not present

## 2018-03-17 ENCOUNTER — Telehealth: Payer: Self-pay | Admitting: Cardiology

## 2018-03-17 ENCOUNTER — Encounter: Payer: Medicare Other | Admitting: *Deleted

## 2018-03-17 NOTE — Telephone Encounter (Signed)
LMOVM reminding pt to send remote transmission.   

## 2018-03-18 ENCOUNTER — Encounter: Payer: Self-pay | Admitting: Cardiology

## 2018-03-22 ENCOUNTER — Emergency Department (HOSPITAL_COMMUNITY): Payer: Medicare Other

## 2018-03-22 ENCOUNTER — Ambulatory Visit (INDEPENDENT_AMBULATORY_CARE_PROVIDER_SITE_OTHER): Payer: Medicare Other | Admitting: *Deleted

## 2018-03-22 ENCOUNTER — Inpatient Hospital Stay (HOSPITAL_COMMUNITY)
Admission: EM | Admit: 2018-03-22 | Discharge: 2018-03-25 | DRG: 065 | Disposition: A | Discharge status: Home / Self Care | Payer: Medicare Other | Attending: Family Medicine | Admitting: Family Medicine

## 2018-03-22 ENCOUNTER — Encounter (HOSPITAL_COMMUNITY): Payer: Self-pay | Admitting: Emergency Medicine

## 2018-03-22 DIAGNOSIS — R404 Transient alteration of awareness: Secondary | ICD-10-CM | POA: Diagnosis not present

## 2018-03-22 DIAGNOSIS — I480 Paroxysmal atrial fibrillation: Secondary | ICD-10-CM | POA: Diagnosis present

## 2018-03-22 DIAGNOSIS — F329 Major depressive disorder, single episode, unspecified: Secondary | ICD-10-CM | POA: Diagnosis present

## 2018-03-22 DIAGNOSIS — R9401 Abnormal electroencephalogram [EEG]: Secondary | ICD-10-CM | POA: Diagnosis present

## 2018-03-22 DIAGNOSIS — Z86718 Personal history of other venous thrombosis and embolism: Secondary | ICD-10-CM | POA: Diagnosis not present

## 2018-03-22 DIAGNOSIS — Z993 Dependence on wheelchair: Secondary | ICD-10-CM | POA: Diagnosis not present

## 2018-03-22 DIAGNOSIS — Z66 Do not resuscitate: Secondary | ICD-10-CM | POA: Diagnosis present

## 2018-03-22 DIAGNOSIS — I63312 Cerebral infarction due to thrombosis of left middle cerebral artery: Secondary | ICD-10-CM | POA: Diagnosis not present

## 2018-03-22 DIAGNOSIS — G459 Transient cerebral ischemic attack, unspecified: Secondary | ICD-10-CM | POA: Diagnosis present

## 2018-03-22 DIAGNOSIS — I340 Nonrheumatic mitral (valve) insufficiency: Secondary | ICD-10-CM | POA: Diagnosis not present

## 2018-03-22 DIAGNOSIS — R64 Cachexia: Secondary | ICD-10-CM | POA: Diagnosis present

## 2018-03-22 DIAGNOSIS — G9340 Encephalopathy, unspecified: Secondary | ICD-10-CM

## 2018-03-22 DIAGNOSIS — I69351 Hemiplegia and hemiparesis following cerebral infarction affecting right dominant side: Secondary | ICD-10-CM | POA: Diagnosis not present

## 2018-03-22 DIAGNOSIS — F0150 Vascular dementia without behavioral disturbance: Secondary | ICD-10-CM | POA: Diagnosis present

## 2018-03-22 DIAGNOSIS — F419 Anxiety disorder, unspecified: Secondary | ICD-10-CM | POA: Diagnosis present

## 2018-03-22 DIAGNOSIS — I4891 Unspecified atrial fibrillation: Secondary | ICD-10-CM | POA: Diagnosis not present

## 2018-03-22 DIAGNOSIS — I495 Sick sinus syndrome: Secondary | ICD-10-CM

## 2018-03-22 DIAGNOSIS — E785 Hyperlipidemia, unspecified: Secondary | ICD-10-CM | POA: Diagnosis present

## 2018-03-22 DIAGNOSIS — I63412 Cerebral infarction due to embolism of left middle cerebral artery: Secondary | ICD-10-CM

## 2018-03-22 DIAGNOSIS — R29712 NIHSS score 12: Secondary | ICD-10-CM | POA: Diagnosis present

## 2018-03-22 DIAGNOSIS — R54 Age-related physical debility: Secondary | ICD-10-CM

## 2018-03-22 DIAGNOSIS — Z681 Body mass index (BMI) 19 or less, adult: Secondary | ICD-10-CM | POA: Diagnosis not present

## 2018-03-22 DIAGNOSIS — I272 Pulmonary hypertension, unspecified: Secondary | ICD-10-CM | POA: Diagnosis present

## 2018-03-22 DIAGNOSIS — I482 Chronic atrial fibrillation: Secondary | ICD-10-CM | POA: Diagnosis not present

## 2018-03-22 DIAGNOSIS — E46 Unspecified protein-calorie malnutrition: Secondary | ICD-10-CM | POA: Diagnosis present

## 2018-03-22 DIAGNOSIS — E639 Nutritional deficiency, unspecified: Secondary | ICD-10-CM | POA: Diagnosis present

## 2018-03-22 DIAGNOSIS — Z7982 Long term (current) use of aspirin: Secondary | ICD-10-CM

## 2018-03-22 DIAGNOSIS — Z515 Encounter for palliative care: Secondary | ICD-10-CM | POA: Diagnosis not present

## 2018-03-22 DIAGNOSIS — R531 Weakness: Secondary | ICD-10-CM | POA: Diagnosis not present

## 2018-03-22 DIAGNOSIS — Z950 Presence of cardiac pacemaker: Secondary | ICD-10-CM

## 2018-03-22 DIAGNOSIS — E039 Hypothyroidism, unspecified: Secondary | ICD-10-CM | POA: Diagnosis not present

## 2018-03-22 DIAGNOSIS — G9349 Other encephalopathy: Secondary | ICD-10-CM | POA: Diagnosis present

## 2018-03-22 DIAGNOSIS — I824Z9 Acute embolism and thrombosis of unspecified deep veins of unspecified distal lower extremity: Secondary | ICD-10-CM | POA: Diagnosis present

## 2018-03-22 DIAGNOSIS — Z800 Family history of malignant neoplasm of digestive organs: Secondary | ICD-10-CM

## 2018-03-22 DIAGNOSIS — I10 Essential (primary) hypertension: Secondary | ICD-10-CM | POA: Diagnosis present

## 2018-03-22 DIAGNOSIS — Z7189 Other specified counseling: Secondary | ICD-10-CM | POA: Diagnosis not present

## 2018-03-22 DIAGNOSIS — Z8249 Family history of ischemic heart disease and other diseases of the circulatory system: Secondary | ICD-10-CM

## 2018-03-22 DIAGNOSIS — R1312 Dysphagia, oropharyngeal phase: Secondary | ICD-10-CM

## 2018-03-22 DIAGNOSIS — I639 Cerebral infarction, unspecified: Secondary | ICD-10-CM | POA: Diagnosis not present

## 2018-03-22 DIAGNOSIS — I69320 Aphasia following cerebral infarction: Secondary | ICD-10-CM

## 2018-03-22 DIAGNOSIS — Z9181 History of falling: Secondary | ICD-10-CM

## 2018-03-22 LAB — CBC
HCT: 33.6 % — ABNORMAL LOW (ref 36.0–46.0)
Hemoglobin: 10.9 g/dL — ABNORMAL LOW (ref 12.0–15.0)
MCH: 31.7 pg (ref 26.0–34.0)
MCHC: 32.4 g/dL (ref 30.0–36.0)
MCV: 97.7 fL (ref 78.0–100.0)
Platelets: 149 10*3/uL — ABNORMAL LOW (ref 150–400)
RBC: 3.44 MIL/uL — ABNORMAL LOW (ref 3.87–5.11)
RDW: 15 % (ref 11.5–15.5)
WBC: 3.5 10*3/uL — AB (ref 4.0–10.5)

## 2018-03-22 LAB — URINALYSIS, ROUTINE W REFLEX MICROSCOPIC
Bilirubin Urine: NEGATIVE
GLUCOSE, UA: NEGATIVE mg/dL
HGB URINE DIPSTICK: NEGATIVE
Ketones, ur: NEGATIVE mg/dL
Leukocytes, UA: NEGATIVE
Nitrite: NEGATIVE
PROTEIN: NEGATIVE mg/dL
Specific Gravity, Urine: 1.013 (ref 1.005–1.030)
pH: 9 — ABNORMAL HIGH (ref 5.0–8.0)

## 2018-03-22 LAB — DIFFERENTIAL
BASOS ABS: 0 10*3/uL (ref 0.0–0.1)
BASOS PCT: 1 %
EOS ABS: 0 10*3/uL (ref 0.0–0.7)
Eosinophils Relative: 1 %
Lymphocytes Relative: 39 %
Lymphs Abs: 1.3 10*3/uL (ref 0.7–4.0)
Monocytes Absolute: 0.3 10*3/uL (ref 0.1–1.0)
Monocytes Relative: 8 %
NEUTROS ABS: 1.8 10*3/uL (ref 1.7–7.7)
NEUTROS PCT: 51 %

## 2018-03-22 LAB — I-STAT CHEM 8, ED
BUN: 20 mg/dL (ref 6–20)
CHLORIDE: 98 mmol/L — AB (ref 101–111)
CREATININE: 0.8 mg/dL (ref 0.44–1.00)
Calcium, Ion: 1.21 mmol/L (ref 1.15–1.40)
Glucose, Bld: 88 mg/dL (ref 65–99)
HEMATOCRIT: 33 % — AB (ref 36.0–46.0)
HEMOGLOBIN: 11.2 g/dL — AB (ref 12.0–15.0)
POTASSIUM: 4.2 mmol/L (ref 3.5–5.1)
Sodium: 139 mmol/L (ref 135–145)
TCO2: 33 mmol/L — ABNORMAL HIGH (ref 22–32)

## 2018-03-22 LAB — COMPREHENSIVE METABOLIC PANEL
ALBUMIN: 3.4 g/dL — AB (ref 3.5–5.0)
ALT: 12 U/L — ABNORMAL LOW (ref 14–54)
AST: 20 U/L (ref 15–41)
Alkaline Phosphatase: 49 U/L (ref 38–126)
Anion gap: 10 (ref 5–15)
BUN: 17 mg/dL (ref 6–20)
CHLORIDE: 101 mmol/L (ref 101–111)
CO2: 27 mmol/L (ref 22–32)
Calcium: 8.8 mg/dL — ABNORMAL LOW (ref 8.9–10.3)
Creatinine, Ser: 0.78 mg/dL (ref 0.44–1.00)
GFR calc Af Amer: >60 mL/min (ref >60)
GFR calc non Af Amer: >60 mL/min (ref >60)
GLUCOSE: 89 mg/dL (ref 65–99)
POTASSIUM: 4.2 mmol/L (ref 3.5–5.1)
SODIUM: 138 mmol/L (ref 135–145)
Total Bilirubin: 0.6 mg/dL (ref 0.3–1.2)
Total Protein: 6.7 g/dL (ref 6.5–8.1)

## 2018-03-22 LAB — I-STAT TROPONIN, ED: Troponin i, poc: 0 ng/mL (ref 0.00–0.08)

## 2018-03-22 LAB — RAPID URINE DRUG SCREEN, HOSP PERFORMED
AMPHETAMINES: NOT DETECTED
BARBITURATES: NOT DETECTED
BENZODIAZEPINES: POSITIVE — AB
COCAINE: NOT DETECTED
Opiates: NOT DETECTED
TETRAHYDROCANNABINOL: NOT DETECTED

## 2018-03-22 LAB — HEMOGLOBIN A1C
Hgb A1c MFr Bld: 5.3 % (ref 4.8–5.6)
Mean Plasma Glucose: 105.41 mg/dL

## 2018-03-22 LAB — CBG MONITORING, ED: Glucose-Capillary: 76 mg/dL (ref 65–99)

## 2018-03-22 LAB — PROTIME-INR
INR: 1.09
Prothrombin Time: 14.1 seconds (ref 11.4–15.2)

## 2018-03-22 LAB — ETHANOL

## 2018-03-22 LAB — APTT: APTT: 29 s (ref 24–36)

## 2018-03-22 LAB — TSH: TSH: 3.494 u[IU]/mL (ref 0.350–4.500)

## 2018-03-22 MED ORDER — SODIUM CHLORIDE 0.9 % IV SOLN
INTRAVENOUS | Status: DC
  Administered 2018-03-22: 19:00:00 via INTRAVENOUS

## 2018-03-22 MED ORDER — HEPARIN SODIUM (PORCINE) 5000 UNIT/ML IJ SOLN
5000.0000 [IU] | Freq: Three times a day (TID) | INTRAMUSCULAR | Status: DC
  Administered 2018-03-22: 5000 [IU] via SUBCUTANEOUS

## 2018-03-22 MED ORDER — STROKE: EARLY STAGES OF RECOVERY BOOK
Freq: Once | Status: AC
  Administered 2018-03-22: 23:00:00
  Filled 2018-03-22: qty 1

## 2018-03-22 MED ORDER — HYDRALAZINE HCL 20 MG/ML IJ SOLN: 5.0000 mg | Freq: Three times a day (TID) | INTRAMUSCULAR | Status: DC | PRN

## 2018-03-22 MED ORDER — LEVOTHYROXINE SODIUM 100 MCG IV SOLR
12.5000 ug | Freq: Every day | INTRAVENOUS | Status: DC
  Administered 2018-03-23: 12.5 ug via INTRAVENOUS
  Filled 2018-03-22 (×2): qty 5

## 2018-03-22 MED ORDER — ASPIRIN 300 MG RE SUPP
300.0000 mg | Freq: Every day | RECTAL | Status: DC
  Administered 2018-03-22 – 2018-03-25 (×4): 300 mg via RECTAL
  Filled 2018-03-22 (×3): qty 1

## 2018-03-22 MED ORDER — ATORVASTATIN CALCIUM 80 MG PO TABS
80.0000 mg | ORAL_TABLET | Freq: Every day | ORAL | Status: DC
  Administered 2018-03-22: 80 mg via ORAL
  Filled 2018-03-22: qty 1

## 2018-03-22 NOTE — Consult Note (Addendum)
Requesting Physician: Dr. Johnney Killian    Chief Complaint: Aphasia, worsening right side weakness  History obtained from: EMS and Chart review  HPI:                                                                                                                                       Christy Miranda is an 84 y.o. 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  She was last seen normal at 8.30 am by home health staff speaking. Around 12.30 pm she was found to be non verbal and increasingly weak on her right side. EMS arrives, BP was around 546 systolic. Her baseline is wheelchair bound to residual right hemiparesis. '   ER course:  She underwent CT Head which redomestrated her old left MCA stroke. No acute infarcts identified. Patient was not a candidate for tPA due to multiple relative contraindications including presenting in the cusp of the 4.5 hr window and age >66 ( not meeting ECASS 2 criteria), high risk of hemorrhage to prior hemorrhagic conversion after tPA and poor baseline.  Date last known well: 4.22.19 Time last known well: 8.30 am tPA Given: no,  NIHSS: 12 Baseline MRS 4   Past Medical History:  Diagnosis Date  . Anxiety   . Atrial fibrillation ([place])   . Bradycardia    s/p PPM  . Diverticulitis    history of  . Dizziness and giddiness 07/22/2013  . DVT (deep vein thrombosis) in pregnancy ([place])    right peroneal  . History of hysterectomy   . HTN (hypertension)   . Hypothyroidism   . Long-term (current) use of anticoagulants   . Pelvis fracture ([place]) 05-24-14  . Permanent atrial fibrillation ([place])   . Scoliosis (and kyphoscoliosis), idiopathic   . Stroke [facility])     Past Surgical History:  Procedure Laterality Date  . CARDIAC CATHETERIZATION  01/08/1999   normal LV function  .  CARDIOVERSION  10/30/2006   Successful elective DC cardioversion  . CARDIOVERSION  12/29/2002   Successful DC cardioversion  . CARDIOVERSION  12/03/1999   Successful DC cardioversion  . CHOLECYSTECTOMY    . PACEMAKER GENERATOR CHANGE N/A 11/09/2012   Procedure: PACEMAKER GENERATOR CHANGE;  Surgeon: Thompson Grayer, MD;  Location: [facility] CATH LAB;  Service: Cardiovascular;  Laterality: N/A;  . PACEMAKER INSERTION  03/03/2006   most recent generator (MDT) change 10/30/06 by Dr Verlon Setting    Family History  Problem Relation Age of Onset  . Coronary artery disease Mother   . Cancer - Other Father        Pancreatic Cancer   Social History:  reports that she has never smoked. She has never used smokeless tobacco. She reports that she does not drink alcohol or use drugs.  Allergies: No Known Allergies  Medications:                                                                                                                        I reviewed home medications- Asa 81mg     ROS:                                                                                                                                     14 systems reviewed and negative except above   Examination:                                                                                                      General: Small, Psych: Affect appropriate to situation Eyes: No scleral injection HENT: No OP obstrucion, has significant kyphosis  Head: Normocephalic.  Cardiovascular: Normal rate and regular rhythm.  Respiratory: Effort normal and breath sounds normal to anterior ascultation GI: Soft.  No distension. There is no tenderness.  Skin: WDI   Neurological Examination Mental Status: Alert, non verbal, aphasic not able to follow 3 step commands Cranial Nerves: II: Visual fields : difficult to assess, reduced blink to threat on left side III,IV, VI: ptosis not present, extra-ocular motions intact bilaterally, pupils equal, round,  reactive to light and accommodation V,VII: smile symmetric, facial light touch sensation normal bilaterally XII: midline tongue extension Motor: Right : Upper extremity   2/5    Left:     Upper extremity   4/5  Lower extremity   2/5     Lower extremity   4/5 Tone and bulk:increased tone on right side Sensory: unable to assess accurately Deep Tendon Reflexes: brisk on right side compared to left Plantars: Right: downgoing   Left: downgoing Cerebellar: Unable to assess Gait: not assessed due to safety     Lab Results: Basic Metabolic Panel: Recent Labs  Lab 03/22/18 1255  NA 139  K 4.2  CL 98*  GLUCOSE 88  BUN 20  CREATININE  0.80    CBC: Recent Labs  Lab 03/22/18 1255  HGB 11.2*  HCT 33.0*    Coagulation Studies: No results for input(s): LABPROT, INR in the last 72 hours.  Imaging: No results found.   ASSESSMENT AND PLAN  67 y female with history of CVA with residual right hemiparesis, Afib not on anticoagulation presents with aphasia and worsening right side weakness. CT head negative for bleed, did not receive tPA due to prior hemorrhagic conversion following tPA and poor MRS.    Acute Ischemic Stroke - Left MCA  Differential diagnosis: possible seizure    Risk factors: Afib, prior CVA Etiology: cardioembolic   Recommend #Transthoracic Echo  # Continue ASA  #Start or continue Atorvastatin 80 mg/other high intensity statin # BP goal: permissive HTN upto 220/110 mm Hg # HBAIC and Lipid profile # Telemetry monitoring # Frequent neuro checks\ #PT/OT eval  # NPO until passes stroke swallow screen # Consider EEG   Please page stroke NP  Or  PA  Or MD from 8am -4 pm  as this patient from this time will be  followed by the stroke.   You can look them up on [email or web address]  Password [facility]   Ephriam Turman [facility] Pager Number [phone]

## 2018-03-22 NOTE — Code Documentation (Signed)
84 yo female coming from home with home health. Caregiver reported that patient went to bed normally at 1230 this morning. New caregiver came in and said she was asleep at 0830. Pt was woken up at 1130 where she was alert, but unable to walk to the bathroom or speak to the caregiver. Pt has hx of CVA with right sided weakness and aphasia, but pt is able to normally bear weight on the right side. Pt is wheelchair bound at home. EMS was called and activated a Code Stroke. UPon arrival to the ED, pt has an NIHSS of 12 due to right side weakness, severe aphasia and dysarthria. Family at the bedside. No tPA due to outside of window and hx of hemorrhagic transformation post tPA. CT completed. Handoff given to [place], Therapist, sports.

## 2018-03-22 NOTE — ED Notes (Signed)
Pt CBG was 76, notified Hayley(RN)

## 2018-03-22 NOTE — ED Provider Notes (Signed)
[facility] EMERGENCY DEPARTMENT Provider Note   CSN: [id number] Arrival date & time: 03/22/18  1250   An emergency department physician performed an initial assessment on this suspected stroke patient at 1250.  History   Chief Complaint Chief Complaint  Patient presents with  . Code Stroke    HPI Christy Miranda is a 84 y.o. female.  HPI Patient has in-home caregiver.  She at baseline is interactive and uses her walker with assistance.  She does have some waxing and waning dementia.  This morning, her caregiver reports that when she got out of bed she was having difficulty speaking and even following simple commands.  She reports that she seemed to be dragging on the right side.  He does have old right-sided stroke but her daughter and caregiver reports that normally there is no appreciable asymmetry to her use of the extremities.  Reports she has been well leading up to this.  She has been eating and behaving as usual without fevers or signs of acute illness. Past Medical History:  Diagnosis Date  . Anxiety   . Atrial fibrillation ([place])   . Bradycardia    s/p PPM  . Diverticulitis    history of  . Dizziness and giddiness 07/22/2013  . DVT (deep vein thrombosis) in pregnancy ([place])    right peroneal  . History of hysterectomy   . HTN (hypertension)   . Hypothyroidism   . Long-term (current) use of anticoagulants   . Pelvis fracture ([place]) 05-24-14  . Permanent atrial fibrillation ([place])   . Scoliosis (and kyphoscoliosis), idiopathic   . Stroke [facility])     Patient Active Problem List   Diagnosis Date Noted  . Chest pain at rest 08/02/2015  . CVA (cerebral infarction) 06/19/2014  . Undernutrition 06/19/2014  . Fracture of multiple pubic rami ([place]) 05/22/2014  . Encounter for therapeutic drug monitoring 01/02/2014  . Dizziness and giddiness 07/22/2013  . Thrombocytopenia ([place]) 11/16/2012  . Deep venous thrombosis of lower leg ([place]) 11/13/2012  .  Subtherapeutic international normalized ratio (INR) 11/13/2012  . Pancytopenia ([place]) 11/13/2012  . Edema 11/07/2012  . Pacemaker-Medtronic 08/19/2012  . Current use of long term anticoagulation 08/08/2011  . Atrial fibrillation ([place]) 02/28/2011  . Tachycardia-bradycardia syndrome ([place]) 02/28/2011  . Hypertension 02/28/2011    Past Surgical History:  Procedure Laterality Date  . CARDIAC CATHETERIZATION  01/08/1999   normal LV function  . CARDIOVERSION  10/30/2006   Successful elective DC cardioversion  . CARDIOVERSION  12/29/2002   Successful DC cardioversion  . CARDIOVERSION  12/03/1999   Successful DC cardioversion  . CHOLECYSTECTOMY    . PACEMAKER GENERATOR CHANGE N/A 11/09/2012   Procedure: PACEMAKER GENERATOR CHANGE;  Surgeon: Thompson Grayer, MD;  Location: [facility] C[facility] LAB;  Service: Cardiovascular;  Laterality: N/A;  . PACEMAKER INSERTION  03/03/2006   most recent generator (MDT) change 10/30/06 by Dr Verlon Setting     OB History   None      Home Medications    Prior to Admission medications   Medication Sig Start Date End Date Taking? Authorizing Provider  ALPRAZolam (XANAX) 0.5 MG tablet TAKE 1/2 TABLET (0.25) 3 TIMES A DAY AS NEEDED FOR ANXIETY 12/18/16  Yes [provider]  aspirin 81 MG tablet Take 81 mg by mouth daily.   Yes [provider]  levothyroxine (SYNTHROID, LEVOTHROID) 50 MCG tablet Take 1 tablet (50 mcg total) by mouth daily. Patient taking differently: Take 25 mcg by mouth daily.  01/19/14  Yes [place],  Ander Slade, MD  metoprolol tartrate (LOPRESSOR) 50 MG tablet Take 1 tablet (50 mg total) by mouth 2 (two) times daily. 01/18/18  Yes [place], Peter M, MD  pantoprazole (PROTONIX) 40 MG tablet Take 40 mg by mouth daily.   Yes [provider]  sertraline (ZOLOFT) 50 MG tablet Take 50 mg by mouth daily. 10/11/14  Yes [provider]  vitamin B-12 (CYANOCOBALAMIN) 1000 MCG tablet Take 1,000 mcg by mouth daily.   Yes [provider]   acetaminophen (TYLENOL) 325 MG tablet Take 1 tablet (325 mg total) by mouth every 6 (six) hours as needed for mild pain, fever or headache. 05/25/14   Kinnie Feil, MD    Family History Family History  Problem Relation Age of Onset  . Coronary artery disease Mother   . Cancer - Other Father        Pancreatic Cancer    Social History Social History   Tobacco Use  . Smoking status: Never Smoker  . Smokeless tobacco: Never Used  Substance Use Topics  . Alcohol use: No    Alcohol/week: 0.0 oz  . Drug use: No     Allergies   Patient has no known allergies.   Review of Systems Review of Systems 10 Systems reviewed and are negative for acute change except as noted in the HPI.   Physical Exam Updated Vital Signs BP (!) 171/68   Pulse 62   Temp 97.9 F (36.6 C) (Oral)   Resp 18   Ht 4\' 9"  (1.448 m)   Wt 39.9 kg (88 lb)   SpO2 98%   BMI 19.04 kg/m   Physical Exam Constitutional: Patient is alert in appearance.  She is nontoxic.  No respiratory distress. HEENT: Cephalic atraumatic.  [place]ukas membranes moist. Cardiovascular regular no gross rub or gallop Respiratory: Clear with no gross wheeze rhonchi or rale. GI: Abdomen is soft nontender Muscular skeletal: No significant peripheral edema.  No wounds or cellulitis of the extremities. Neurologic: Patient has difficulty making intelligible speech.  She begins to make a response that seems appropriate but then voice trails off and she does not provide intelligible answer..  No obvious facial droop.  Difficult to discern weakness left from right.  She does not follow commands well enough to get good motor exam.  When having her try to do grip strength on the left she holds out her hand and touches my hand but does not do grip. Skin warm and dry.  ED Treatments / Results  Labs (all labs ordered are listed, but only abnormal results are displayed) Labs Reviewed  CBC - Abnormal; Notable for the following components:       Result Value   WBC 3.5 (*)    RBC 3.44 (*)    Hemoglobin 10.9 (*)    HCT 33.6 (*)    Platelets 149 (*)    All other components within normal limits  COMPREHENSIVE METABOLIC PANEL - Abnormal; Notable for the following components:   Calcium 8.8 (*)    Albumin 3.4 (*)    ALT 12 (*)    All other components within normal limits  I-STAT CHEM 8, ED - Abnormal; Notable for the following components:   Chloride 98 (*)    TCO2 33 (*)    Hemoglobin 11.2 (*)    HCT 33.0 (*)    All other components within normal limits  ETHANOL  PROTIME-INR  APTT  DIFFERENTIAL  RAPID URINE DRUG SCREEN, HOSP PERFORMED  URINALYSIS, ROUTINE W  REFLEX MICROSCOPIC  I-STAT TROPONIN, ED  CBG MONITORING, ED    EKG EKG Interpretation  Date/Time:  Monday March 22 2018 13:07:48 EDT Ventricular Rate:  75 PR Interval:    QRS Duration: 88 QT Interval:  372 QTC Calculation: 421 R Axis:   99 Text Interpretation:  Atrial fibrillation Right axis deviation Consider left ventricular hypertrophy Repol abnrm suggests ischemia, diffuse leads no sig change from previous Confirmed by Charlesetta Shanks [phone]) on 03/22/2018 4:27:02 PM   Radiology Ct Head Code Stroke Wo Contrast  Result Date: 03/22/2018 CLINICAL DATA:  Code stroke.  Stroke.  Right-sided weakness, aphasia EXAM: CT HEAD WITHOUT CONTRAST TECHNIQUE: Contiguous axial images were obtained from the base of the skull through the vertex without intravenous contrast. COMPARISON:  CT head 07/05/2014 FINDINGS: Brain: Patient was scanned in the lateral decubitus position. Chronic left middle cerebral artery infarct. Cystic encephalomalacia in the left lateral basal ganglia extending into the insular cortex. Chronic infarct in the left frontal parietal lobe and in the left occipital lobe. 15 mm hypodensity right frontal white matter appears unchanged. Negative for hemorrhage or mass. Generalized atrophy. Negative for hydrocephalus. Vascular: Negative for hyperdense vessel Skull:  Negative Sinuses/Orbits: Bilateral ocular surgery. Mild mucosal edema in the right maxillary sinus. Other: None ASPECTS ([place] Stroke Program Early CT Score) - Ganglionic level infarction (caudate, lentiform nuclei, internal capsule, insula, M1-M3 cortex): 7 - Supraganglionic infarction (M4-M6 cortex): 3 Total score (0-10 with 10 being normal): 10 IMPRESSION: 1. Chronic left MCA infarct and chronic microvascular ischemia in the white matter bilaterally. No acute abnormality 2. ASPECTS is 10 3. These results were called by telephone at the time of interpretation on 03/22/2018 at 1:12 pm to Dr. Lorraine Lax, who verbally acknowledged these results. Electronically Signed   By: Franchot Gallo M.D.   On: 03/22/2018 13:13    Procedures Procedures (including critical care time)  Medications Ordered in ED Medications - No data to display   Initial Impression / Assessment and Plan / ED Course  I have reviewed the triage vital signs and the nursing notes.  Pertinent labs & imaging results that were available during my care of the patient were reviewed by me and considered in my medical decision making (see chart for details).    Consult: Patient seen by Dr. Malen Gauze for stroke.  Not interventional or thrombolytic candidate.  Medical management. Consultation: Reviewed and tried hospitalist for admission.  Final Clinical Impressions(s) / ED Diagnoses   Final diagnoses:  Cerebrovascular accident (CVA), unspecified mechanism [facility])  Advanced age  Patient presents as outlined above.  At this time her mental status is alert and responsive.  She has no respiratory distress or airway compromise.  Review of systems is negative for other etiology of symptoms such as infectious or metabolic.  She is not a candidate for thrombolytic or interventional management for stroke.  Plan will be for admission to hospitalist service for neurology consultation.  ED Discharge Orders    None       Charlesetta Shanks, MD 03/22/18  [phone]

## 2018-03-22 NOTE — ED Triage Notes (Signed)
Per [facility] patient coming from home. [id number] per ems. Per family lsn 1230 AM when she went to sleep last night. Patient unable to follow simple commands, and difficulty walking along with some aphasia. Hx: previous stroke & afib. Residual right sided weakness and right sided head kyphosis from previous stroke. Patient disoriented x4. Unable to get any understandable words out.

## 2018-03-22 NOTE — Progress Notes (Addendum)
Patient was seen, examined,treatment plan was discussed with the Advance Practice Provider.  I have personally reviewed the clinical findings, labs, EKG, imaging studies and management of this patient in detail. I have also reviewed the orders written for this patient which were under my direction. I agree with the documentation, as recorded by the Advance Practice Provider.   Kaeya Schiffer is a 84 y.o. female with a Past Medical History of CVA; afib not on AC; HTN; and pacemaker placement who presents with code stroke - dysarthria, difficulty interacting, difficulty following commands.  Code stroke was called and neurology has evaluated the patient.  CT showed a chronic ischemic stroke in the left MCA distribution.  At the time of my evaluation, the patient was alert and attempted to communicate but her speech was nonsensical.  She appeared to have diminished strength of the RUE and RLE although the exam was limited by her ability to participate.    Concern for acute CVA -Will admit for CVA/TIA evaluation -Telemetry monitoring -MRI/MRA may not be possible given pacer -Carotid dopplers -Echo -Risk stratification with FLP, A1c; will also check TSH -ASA daily -PT/OT/ST/Nutrition Consults -SW consult for placement -EEG/seizure precautions -Neurology consulting -Given her afib, she is at significantly increased risk for ongoing CVA if she is not restarted on [facility]; however, she is at high risk for bleeding if she does start - will need to discuss with family. -Palliative care consult.  HTN -Allow permissive HTN -Treat BP only if >220/120, and then with goal of 15% reduction -Hold Lopressor and plan to restart in 48-72 hours  HLD -Check FLP -It is not clear how much benefit the patient will have from statin therapy given her age and comorbidities, but will start lipitor 80 mg daily for now   Karmen Bongo, MD

## 2018-03-22 NOTE — Progress Notes (Signed)
Remote pacemaker transmission.   

## 2018-03-22 NOTE — ED Notes (Signed)
Placed purewick on pt. 

## 2018-03-22 NOTE — H&P (Signed)
History and Physical    Christy Miranda HGD:[id number] DOB: 06/12/1921 DOA: 03/22/2018   PCP: Jonathon Jordan, MD   Patient coming from:  Home    Chief Complaint: Right-sided weakness  HPI: Christy Miranda is a 84 y.o. female with medical history significant for atrial fibrillation not on anticoagulation, history of CVA in 2015 status post TPA with hemorrhagic conversion, bradycardia, presenting with stroke alert to the [facility] emergency department for aphasia, worsening of baseline of right-sided weakness.  The patient was last seen normal at 8:30 AM.  At that time, the patient could communicate but then she was found to be nonverbal around 1230, with increasing weakness on the right side.  Also her level of comprehension was decreased.  She was brought to the emergency department, for further evaluation.  At the ED, neurology has seen her, and a CT of the head was performed, showing old left MCA stroke, without acute infarcts identified.  The patient is being admitted for stroke workup, which is going to be limited, as no aggressive intervention is going to be performed.  Patient is DNR. Other information cannot be obtained, as the patient is unable to communicate.  According to neurology, baseline MRSA is 4.  NIH SS is 12  ED Course:  BP (!) 165/90   Pulse 74   Temp 97.9 F (36.6 C) (Oral)   Resp 16   Ht 4\' 9"  (1.448 m)   Wt 39.9 kg (88 lb)   SpO2 99%   BMI 19.04 kg/m   Troponin is 0, sodium 139, potassium 4.2.  Creatinine 0.8.  Hemoglobin 11.2. Urinalysis negative. EKG without acute changes. CT of the head is as above, without acute findings.  Review of Systems:  As per HPI otherwise all other systems unable to be performed, as the patient [employer] at this time.  Past Medical History:  Diagnosis Date  . Anxiety   . Bradycardia    s/p PPM  . Diverticulitis    history of  . Dizziness and giddiness 07/22/2013  . DVT (deep vein thrombosis) in pregnancy ([place])    right  peroneal  . History of hysterectomy   . HTN (hypertension)   . Hypothyroidism   . Long-term (current) use of anticoagulants   . Pelvis fracture ([place]) 05-24-14  . Permanent atrial fibrillation ([place])   . Scoliosis (and kyphoscoliosis), idiopathic   . Stroke [facility])     Past Surgical History:  Procedure Laterality Date  . CARDIAC CATHETERIZATION  01/08/1999   normal LV function  . CARDIOVERSION  10/30/2006   Successful elective DC cardioversion  . CARDIOVERSION  12/29/2002   Successful DC cardioversion  . CARDIOVERSION  12/03/1999   Successful DC cardioversion  . CHOLECYSTECTOMY    . PACEMAKER GENERATOR CHANGE N/A 11/09/2012   Procedure: PACEMAKER GENERATOR CHANGE;  Surgeon: Thompson Grayer, MD;  Location: [facility] CATH LAB;  Service: Cardiovascular;  Laterality: N/A;  . PACEMAKER INSERTION  03/03/2006   most recent generator (MDT) change 10/30/06 by Dr Verlon Setting    Social History Social History   Socioeconomic History  . Marital status: Widowed    Spouse name: Not on file  . Number of children: 1  . Years of education: 65  . Highest education level: Not on file  Occupational History  . Occupation: Retired    Fish farm manager: RETIRED  Social Needs  . Financial resource strain: Not on file  . Food insecurity:    Worry: Not on file    Inability: Not on file  .  Transportation needs:    Medical: Not on file    Non-medical: Not on file  Tobacco Use  . Smoking status: Never Smoker  . Smokeless tobacco: Never Used  Substance and Sexual Activity  . Alcohol use: No    Alcohol/week: 0.0 oz  . Drug use: No  . Sexual activity: Never  Lifestyle  . Physical activity:    Days per week: Not on file    Minutes per session: Not on file  . Stress: Not on file  Relationships  . Social connections:    Talks on phone: Not on file    Gets together: Not on file    Attends religious service: Not on file    Active member of club or organization: Not on file    Attends meetings of clubs or  organizations: Not on file    Relationship status: Not on file  . Intimate partner violence:    Fear of current or ex partner: Not on file    Emotionally abused: Not on file    Physically abused: Not on file    Forced sexual activity: Not on file  Other Topics Concern  . Not on file  Social History Narrative   Lives with care giver in [place], [place] .  Right handed.  Caffeine None.       No Known Allergies  Family History  Problem Relation Age of Onset  . Coronary artery disease Mother   . Cancer - Other Father        Pancreatic Cancer      Prior to Admission medications   Medication Sig Start Date End Date Taking? Authorizing Provider  ALPRAZolam (XANAX) 0.5 MG tablet TAKE 1/2 TABLET (0.25) 3 TIMES A DAY AS NEEDED FOR ANXIETY 12/18/16  Yes [provider]  aspirin 81 MG tablet Take 81 mg by mouth daily.   Yes [provider]  levothyroxine (SYNTHROID, LEVOTHROID) 50 MCG tablet Take 1 tablet (50 mcg total) by mouth daily. Patient taking differently: Take 25 mcg by mouth daily.  01/19/14  Yes [place], Peter M, MD  metoprolol tartrate (LOPRESSOR) 50 MG tablet Take 1 tablet (50 mg total) by mouth 2 (two) times daily. 01/18/18  Yes [place], Peter M, MD  pantoprazole (PROTONIX) 40 MG tablet Take 40 mg by mouth daily.   Yes [provider]  sertraline (ZOLOFT) 50 MG tablet Take 50 mg by mouth daily. 10/11/14  Yes [provider]  vitamin B-12 (CYANOCOBALAMIN) 1000 MCG tablet Take 1,000 mcg by mouth daily.   Yes [provider]  acetaminophen (TYLENOL) 325 MG tablet Take 1 tablet (325 mg total) by mouth every 6 (six) hours as needed for mild pain, fever or headache. 05/25/14   Kinnie Feil, MD    Physical Exam:  Vitals:   03/22/18 1745 03/22/18 1800 03/22/18 1815 03/22/18 1830  BP: (!) 164/69 (!) 165/71 (!) 161/83 (!) 165/90  Pulse: 61 96 71 74  Resp: 18 (!) 22 (!) 21 16  Temp:      TempSrc:      SpO2: 96% 99% 99% 99%  Weight:        Height:       Constitutional: NAD, calm, chronically ill-appearing, able to follow simple commands, but cannot communicate at this time.   Eyes: PERRL, lids and conjunctivae normal ENMT: Mucous membranes are moist, without exudate or lesions  Neck: normal, supple, no masses, no thyromegaly Respiratory: clear to auscultation bilaterally, no wheezing, no crackles. Normal respiratory effort  Cardiovascular: Irregularly irregular rate and rhythm, soft 1 out of 6 systolic murmur murmur, rubs or gallops. No extremity edema. 2+ pedal pulses. No carotid bruits.  Abdomen: Soft, non tender, No hepatosplenomegaly. Bowel sounds positive.  Musculoskeletal: no clubbing / cyanosis. Moves all extremities acne thickened kyphosis noted. Skin: no jaundice, No lesions.  Neurologic: Sensation intact  Strength remarkable for right-sided weakness, 1 out of 5 .     Labs on Admission: I have personally reviewed following labs and imaging studies  CBC: Recent Labs  Lab 03/22/18 1252 03/22/18 1255  WBC 3.5*  --   NEUTROABS 1.8  --   HGB 10.9* 11.2*  HCT 33.6* 33.0*  MCV 97.7  --   PLT 149*  --     Basic Metabolic Panel: Recent Labs  Lab 03/22/18 1252 03/22/18 1255  NA 138 139  K 4.2 4.2  CL 101 98*  CO2 27  --   GLUCOSE 89 88  BUN 17 20  CREATININE 0.78 0.80  CALCIUM 8.8*  --     GFR: Estimated Creatinine Clearance: 26.2 mL/min (by C-G formula based on SCr of 0.8 mg/dL).  Liver Function Tests: Recent Labs  Lab 03/22/18 1252  AST 20  ALT 12*  ALKPHOS 49  BILITOT 0.6  PROT 6.7  ALBUMIN 3.4*   No results for input(s): LIPASE, AMYLASE in the last 168 hours. No results for input(s): AMMONIA in the last 168 hours.  Coagulation Profile: Recent Labs  Lab 03/22/18 1252  INR 1.09    Cardiac Enzymes: No results for input(s): CKTOTAL, CKMB, CKMBINDEX, TROPONINI in the last 168 hours.  BNP (last 3 results) No results for input(s): PROBNP in the last 8760 hours.  HbA1C: No  results for input(s): HGBA1C in the last 72 hours.  CBG: Recent Labs  Lab 03/22/18 1252  GLUCAP 76    Lipid Profile: No results for input(s): CHOL, HDL, LDLCALC, TRIG, CHOLHDL, LDLDIRECT in the last 72 hours.  Thyroid Function Tests: No results for input(s): TSH, T4TOTAL, FREET4, T3FREE, THYROIDAB in the last 72 hours.  Anemia Panel: No results for input(s): VITAMINB12, FOLATE, FERRITIN, TIBC, IRON, RETICCTPCT in the last 72 hours.  Urine analysis:    Component Value Date/Time   COLORURINE YELLOW 03/22/2018 1733   APPEARANCEUR CLOUDY (A) 03/22/2018 1733   LABSPEC 1.013 03/22/2018 1733   PHURINE 9.0 (H) 03/22/2018 1733   GLUCOSEU NEGATIVE 03/22/2018 1733   HGBUR NEGATIVE 03/22/2018 1733   BILIRUBINUR NEGATIVE 03/22/2018 1733   KETONESUR NEGATIVE 03/22/2018 1733   PROTEINUR NEGATIVE 03/22/2018 1733   UROBILINOGEN 2.0 (H) 06/23/2014 1425   NITRITE NEGATIVE 03/22/2018 1733   LEUKOCYTESUR NEGATIVE 03/22/2018 1733    Sepsis Labs: @LABRCNTIP (procalcitonin:4,lacticidven:4) )No results found for this or any previous visit (from the past 240 hour(s)).   Radiological Exams on Admission: Ct Head Code Stroke Wo Contrast  Result Date: 03/22/2018 CLINICAL DATA:  Code stroke.  Stroke.  Right-sided weakness, aphasia EXAM: CT HEAD WITHOUT CONTRAST TECHNIQUE: Contiguous axial images were obtained from the base of the skull through the vertex without intravenous contrast. COMPARISON:  CT head 07/05/2014 FINDINGS: Brain: Patient was scanned in the lateral decubitus position. Chronic left middle cerebral artery infarct. Cystic encephalomalacia in the left lateral basal ganglia extending into the insular cortex. Chronic infarct in the left frontal parietal lobe and in the left occipital lobe. 15 mm hypodensity right frontal white matter appears unchanged. Negative for hemorrhage or mass. Generalized atrophy. Negative for hydrocephalus. Vascular: Negative for hyperdense vessel Skull: Negative  Sinuses/Orbits: Bilateral ocular surgery. Mild mucosal edema in the right maxillary sinus. Other: None ASPECTS ([place] Stroke Program Early CT Score) - Ganglionic level infarction (caudate, lentiform nuclei, internal capsule, insula, M1-M3 cortex): 7 - Supraganglionic infarction (M4-M6 cortex): 3 Total score (0-10 with 10 being normal): 10 IMPRESSION: 1. Chronic left MCA infarct and chronic microvascular ischemia in the white matter bilaterally. No acute abnormality 2. ASPECTS is 10 3. These results were called by telephone at the time of interpretation on 03/22/2018 at 1:12 pm to Dr. Lorraine Lax, who verbally acknowledged these results. Electronically Signed   By: Franchot Gallo M.D.   On: 03/22/2018 13:13    EKG: Independently reviewed.  Assessment/Plan Principal Problem:   Cerebral infarction [facility]) Active Problems:   Atrial fibrillation (HCC)   Hypertension   Pacemaker-[employer]   Deep venous thrombosis of lower leg (HCC)   Undernutrition   Hypothyroidism   Stroke (cerebrum) (HCC)   Acute left MCA ischemic stroke with right hemiparesis   CT of the head is negative for bleed, did not receive TPA due to prior hemorrhagic conversion following TPA and poor performance status.  The patient is DNR.  Risk factors include atrial fibrillation, prior CVA, hypertension, age, history of LLE DVT  Admit to inpatient telemetry Allow permissive hypertension EKG in a.m. Frequent neuro checks The patient did not pass her swallow evaluation, awaiting SLP PT and OT Transthoracic echocardiogram Continue aspirin per rectum EEG to rule out seizure   Hypertension BP (!) 165/90   Pulse 74   Temp 97.9 F (36.6 C) (Oral)   Resp 16   Ht 4\' 9"  (1.448 m)   Wt 39.9 kg (88 lb)   SpO2 99%   BMI 19.04 kg/m  Hold  home anti-hypertensive medications  Add Hydralazine Q6 hours as needed for BP 210/110   Hyperlipidemia Continue home statins if able to pass SLP     Atrial Fibrillation Not on AC as above   Rate  controlled Continue ASA per rectum   Depression Continue home  Zoloft if passes SLP   Hypothyroidism: Continue home Synthroid   Consults: Neurology Status: DNR Bed  placement, inpatient telemetry Disposition, likely to rehab inpatient   Sharene Butters, PA-C [facility]   Amion text  [phone]   03/22/2018, 6:52 PM

## 2018-03-23 ENCOUNTER — Inpatient Hospital Stay (HOSPITAL_COMMUNITY): Payer: Medicare Other

## 2018-03-23 ENCOUNTER — Other Ambulatory Visit: Payer: Self-pay

## 2018-03-23 ENCOUNTER — Encounter: Payer: Self-pay | Admitting: Cardiology

## 2018-03-23 DIAGNOSIS — I340 Nonrheumatic mitral (valve) insufficiency: Secondary | ICD-10-CM

## 2018-03-23 DIAGNOSIS — E039 Hypothyroidism, unspecified: Secondary | ICD-10-CM

## 2018-03-23 DIAGNOSIS — R54 Age-related physical debility: Secondary | ICD-10-CM

## 2018-03-23 DIAGNOSIS — G9340 Encephalopathy, unspecified: Secondary | ICD-10-CM

## 2018-03-23 DIAGNOSIS — G459 Transient cerebral ischemic attack, unspecified: Secondary | ICD-10-CM

## 2018-03-23 DIAGNOSIS — I63312 Cerebral infarction due to thrombosis of left middle cerebral artery: Secondary | ICD-10-CM

## 2018-03-23 DIAGNOSIS — R1312 Dysphagia, oropharyngeal phase: Secondary | ICD-10-CM

## 2018-03-23 LAB — CUP PACEART REMOTE DEVICE CHECK
Battery Impedance: 1608 Ohm
Brady Statistic RV Percent Paced: 32 %
Date Time Interrogation Session: 20190422160350
Implantable Lead Serial Number: 204151
Implantable Pulse Generator Implant Date: 20131210
Lead Channel Impedance Value: 0 Ohm
Lead Channel Impedance Value: 472 Ohm
Lead Channel Pacing Threshold Pulse Width: 0.4 ms
Lead Channel Setting Pacing Amplitude: 2.5 V
Lead Channel Setting Pacing Pulse Width: 0.4 ms
Lead Channel Setting Sensing Sensitivity: 2 mV
MDC IDC LEAD IMPLANT DT: 20010403
MDC IDC LEAD LOCATION: 753860
MDC IDC MSMT BATTERY REMAINING LONGEVITY: 47 mo
MDC IDC MSMT BATTERY VOLTAGE: 2.76 V
MDC IDC MSMT LEADCHNL RV PACING THRESHOLD AMPLITUDE: 0.5 V

## 2018-03-23 LAB — LIPID PANEL
Cholesterol: 160 mg/dL (ref 0–200)
HDL: 48 mg/dL (ref >40)
LDL CALC: 104 mg/dL — AB (ref 0–99)
Total CHOL/HDL Ratio: 3.3 RATIO
Triglycerides: 41 mg/dL (ref <150)
VLDL: 8 mg/dL (ref 0–40)

## 2018-03-23 MED ORDER — ALPRAZOLAM 0.25 MG PO TABS
0.2500 mg | ORAL_TABLET | Freq: Three times a day (TID) | ORAL | Status: DC | PRN
  Administered 2018-03-23 – 2018-03-24 (×3): 0.25 mg via ORAL
  Filled 2018-03-23 (×3): qty 1

## 2018-03-23 MED ORDER — PANTOPRAZOLE SODIUM 40 MG PO TBEC
40.0000 mg | DELAYED_RELEASE_TABLET | Freq: Every day | ORAL | Status: DC
  Administered 2018-03-23 – 2018-03-25 (×3): 40 mg via ORAL
  Filled 2018-03-23 (×3): qty 1

## 2018-03-23 MED ORDER — SERTRALINE HCL 50 MG PO TABS
50.0000 mg | ORAL_TABLET | Freq: Every day | ORAL | Status: DC
  Administered 2018-03-23 – 2018-03-25 (×3): 50 mg via ORAL
  Filled 2018-03-23 (×3): qty 1

## 2018-03-23 MED ORDER — PRAVASTATIN SODIUM 20 MG PO TABS
20.0000 mg | ORAL_TABLET | Freq: Every day | ORAL | Status: DC
  Administered 2018-03-23 – 2018-03-25 (×3): 20 mg via ORAL
  Filled 2018-03-23 (×3): qty 1

## 2018-03-23 MED ORDER — LEVOTHYROXINE SODIUM 25 MCG PO TABS
25.0000 ug | ORAL_TABLET | Freq: Every day | ORAL | Status: DC
  Administered 2018-03-24 – 2018-03-25 (×2): 25 ug via ORAL
  Filled 2018-03-23 (×2): qty 1

## 2018-03-23 MED ORDER — RESOURCE THICKENUP CLEAR PO POWD
ORAL | Status: DC | PRN
  Filled 2018-03-23: qty 125

## 2018-03-23 NOTE — Progress Notes (Signed)
  Echocardiogram 2D Echocardiogram has been performed.  Christy Miranda 03/23/2018, 9:14 AM

## 2018-03-23 NOTE — Progress Notes (Signed)
PT Cancellation Note  Patient Details Name: Christy Miranda MRN: [id number] DOB: 04/08/1921   Cancelled Treatment:    Reason Eval/Treat Not Completed: Patient at procedure or test/unavailable(echo)   Duncan Dull 03/23/2018, 8:54 AM

## 2018-03-23 NOTE — Procedures (Signed)
HPI: 84 y/o with MS change.  Pt with hx of old cerebral infarct  TECHNICAL SUMMARY:  A multichannel referential and bipolar montage EEG using the standard international 10-20 system was performed on the patient described as awake but nonresponsive to verbal commands.  There is a 5-6 Hz occipital dominant rhythm seen bilaterally.  Low voltage fast activity is distributed symmetrically and maximally in the anterior head regions.  There is much electrode artifact in the left hemisphere, particularly the left frontal region which limits interpretation.    ACTIVATION:  Stepwise photic stimulation at 4-20 flashes per second was performed and did not elicit any abnormal waveforms.  Hyperventilation was not performed.  EPILEPTIFORM ACTIVITY:  There were no spikes, sharp waves or paroxysmal activity.  SLEEP: No sleep is noted.   IMPRESSION:  This is an abnormal EEG demonstrating a mild to moderate diffuse slowing of electrocerebral activity.  This can be seen in a wide variety of encephalopathic state including those of a toxic, metabolic, or degenerative nature.  There were no focal, hemispheric, or lateralizing features.  No epileptiform activity was recorded.  As above, there was a significant amount of electrode artifact in the left hemisphere, particularly the left frontal region which does somewhat limit interpretation.  Correlate clinically.

## 2018-03-23 NOTE — Evaluation (Signed)
Clinical/Bedside Swallow Evaluation Patient Details  Name: Christy Miranda MRN: [id number] Date of Birth: 06/15/1921  Today's Date: 03/23/2018 Time: SLP Start Time (ACUTE ONLY): 1406 SLP Stop Time (ACUTE ONLY): 1426 SLP Time Calculation (min) (ACUTE ONLY): 20 min  Past Medical History:  Past Medical History:  Diagnosis Date  . Anxiety   . Bradycardia    s/p PPM  . Diverticulitis    history of  . Dizziness and giddiness 07/22/2013  . DVT (deep vein thrombosis) in pregnancy ([place])    right peroneal  . History of hysterectomy   . HTN (hypertension)   . Hypothyroidism   . Long-term (current) use of anticoagulants   . Pelvis fracture ([place]) 05-24-14  . Permanent atrial fibrillation ([place])   . Scoliosis (and kyphoscoliosis), idiopathic   . Stroke [facility])    Past Surgical History:  Past Surgical History:  Procedure Laterality Date  . CARDIAC CATHETERIZATION  01/08/1999   normal LV function  . CARDIOVERSION  10/30/2006   Successful elective DC cardioversion  . CARDIOVERSION  12/29/2002   Successful DC cardioversion  . CARDIOVERSION  12/03/1999   Successful DC cardioversion  . CHOLECYSTECTOMY    . PACEMAKER GENERATOR CHANGE N/A 11/09/2012   Procedure: PACEMAKER GENERATOR CHANGE;  Surgeon: Thompson Grayer, MD;  Location: [facility] CATH LAB;  Service: Cardiovascular;  Laterality: N/A;  . PACEMAKER INSERTION  03/03/2006   most recent generator (MDT) change 10/30/06 by Dr Verlon Setting   HPI:  84 y.o. female with history of afib not on AC, brady-tachy syndrome on pacemaker, DVT, HTN, stroke in 2015 admitted for worsening right sided weakness. No tPA given. Neuro workup underway. Pt had two previous MBS in 2015 after a prior stroke, both recommending Dys 1 diet and honey thick liquids by spoon. She returned for an OP MBS a few months later that recommended Dys 3 diet and nectar thick liquids by cup with potential for thin liquids or the water protocol given penetration but no aspiration observed. Her caregiver  says that she has been able to eat solid foods as long as they are in bite-sized pieces but her liquids are still thickened to nectar thick consistency PTA.   Assessment / Plan / Recommendation Clinical Impression  Pt has a h/o dysphagia from prior CVA in 2015 and per caregiver remains on nectar thick liquids at home. Anterior spillage is noted with cup sips of nectar thick liquids, although without overt facial droop suspect that this is in large part to pt's overall position, with her head tilted forward and to the right at baseline. Pt did not have her dentures present and as a result her ability to take bites and masticate a bolus were quite limited. No overt signs of aspiration were observed, although I did review with the pt, her daughter, and her caregiver, that silent aspiration is a possibility particularly given that she has had silent aspiration in the past. At this point her daughter would like her to be able to eat and would like to start with the textures she was receiving at home since there are not overt signs of aspiration with them. Recommend at first starting with Dys 1 diet until her dentures can be brought in, but with potential consideration for advancement at that point. Will also resume nectar thick liquids with careful monitoring at bedside for tolerance. Pt, daughter, and caregiver are all aware that MBS could be completed if in line with GOC, although her posture again could potentially impede our full view.  SLP Visit  Diagnosis: Dysphagia, oropharyngeal phase (R13.12)    Aspiration Risk  Moderate aspiration risk    Diet Recommendation Dysphagia 1 (Puree);Nectar-thick liquid   Liquid Administration via: Cup Medication Administration: Crushed with puree Supervision: Staff to assist with self feeding;Full supervision/cueing for compensatory strategies Compensations: Slow rate;Small sips/bites;Monitor for anterior loss Postural Changes: Seated upright at 90 degrees    Other   Recommendations Oral Care Recommendations: Oral care BID Other Recommendations: Order thickener from pharmacy;Prohibited food (jello, ice cream, thin soups);Remove water pitcher   Follow up Recommendations Home health SLP;24 hour supervision/assistance      Frequency and Duration min 2x/week  2 weeks       Prognosis Prognosis for Safe Diet Advancement: Good      Swallow Study   General HPI: 84 y.o. female with history of afib not on AC, brady-tachy syndrome on pacemaker, DVT, HTN, stroke in 2015 admitted for worsening right sided weakness. No tPA given. Neuro workup underway. Pt had two previous MBS in 2015 after a prior stroke, both recommending Dys 1 diet and honey thick liquids by spoon. She returned for an OP MBS a few months later that recommended Dys 3 diet and nectar thick liquids by cup with potential for thin liquids or the water protocol given penetration but no aspiration observed. Her caregiver says that she has been able to eat solid foods as long as they are in bite-sized pieces but her liquids are still thickened to nectar thick consistency PTA. Type of Study: Bedside Swallow Evaluation Previous Swallow Assessment: see HPI Diet Prior to this Study: NPO Temperature Spikes Noted: No Respiratory Status: Room air History of Recent Intubation: No Behavior/Cognition: Alert;Cooperative;Pleasant mood;Requires cueing Oral Cavity Assessment: Within Functional Limits Oral Care Completed by SLP: No Oral Cavity - Dentition: Edentulous;Dentures, not available Vision: Functional for self-feeding Self-Feeding Abilities: Able to feed self;Needs assist Patient Positioning: Upright in chair Baseline Vocal Quality: Normal Volitional Cough: Cognitively unable to elicit Volitional Swallow: Unable to elicit    Oral/Motor/Sensory Function Overall Oral Motor/Sensory Function: (difficult to assess)   Ice Chips Ice chips: Not tested   Thin Liquid Thin Liquid: Not tested    Nectar Thick  Nectar Thick Liquid: Impaired Presentation: Cup;Self Fed;Spoon Oral Phase Impairments: Reduced labial seal Oral phase functional implications: Right anterior spillage   Honey Thick Honey Thick Liquid: Not tested   Puree Puree: Within functional limits Presentation: Self Fed;Spoon   Solid   GO   Solid: Impaired Presentation: Self Fed Oral Phase Functional Implications: Impaired mastication        Germain Osgood 03/23/2018,4:17 PM  Germain Osgood, M.A. CCC-SLP [phone]

## 2018-03-23 NOTE — Social Work (Signed)
CSW acknowledging consult for SNF placement, aware pt has caregivers 24/7 currently, PT/OT recommendations indicate that they feel she can be functionally safe at home with caregivers.   CSW signing off. Please consult if any additional needs arise.  Alexander Mt, [place] Work [phone]

## 2018-03-23 NOTE — Evaluation (Signed)
Speech Language Pathology Evaluation Patient Details Name: Christy Miranda MRN: [id number] DOB: Apr 30, 1921 Today's Date: 03/23/2018 Time: 3825-0539 SLP Time Calculation (min) (ACUTE ONLY): 18 min  Problem List:  Patient Active Problem List   Diagnosis Date Noted  . Acute encephalopathy   . Advanced age   . TIA (transient ischemic attack)   . Oropharyngeal dysphagia   . Hypothyroidism 03/22/2018  . Stroke (cerebrum) ([place]) 03/22/2018  . Chest pain at rest 08/02/2015  . Cerebral infarction ([place]) 06/19/2014  . Undernutrition 06/19/2014  . Fracture of multiple pubic rami ([place]) 05/22/2014  . Encounter for therapeutic drug monitoring 01/02/2014  . Dizziness and giddiness 07/22/2013  . Thrombocytopenia ([place]) 11/16/2012  . Deep venous thrombosis of lower leg ([place]) 11/13/2012  . Subtherapeutic international normalized ratio (INR) 11/13/2012  . Pancytopenia ([place]) 11/13/2012  . Edema 11/07/2012  . Pacemaker-[employer]tr[employer] 08/19/2012  . Current use of long term anticoagulation 08/08/2011  . Atrial fibrillation ([place]) 02/28/2011  . Tachycardia-bradycardia syndrome ([place]) 02/28/2011  . Hypertension 02/28/2011   Past Medical History:  Past Medical History:  Diagnosis Date  . Anxiety   . Bradycardia    s/p PPM  . Diverticulitis    history of  . Dizziness and giddiness 07/22/2013  . DVT (deep vein thrombosis) in pregnancy ([place])    right peroneal  . History of hysterectomy   . HTN (hypertension)   . Hypothyroidism   . Long-term (current) use of anticoagulants   . Pelvis fracture ([place]) 05-24-14  . Permanent atrial fibrillation ([place])   . Scoliosis (and kyphoscoliosis), idiopathic   . Stroke [facility])    Past Surgical History:  Past Surgical History:  Procedure Laterality Date  . CARDIAC CATHETERIZATION  01/08/1999   normal LV function  . CARDIOVERSION  10/30/2006   Successful elective DC cardioversion  . CARDIOVERSION  12/29/2002   Successful DC cardioversion  . CARDIOVERSION  12/03/1999   Successful DC cardioversion  . CHOLECYSTECTOMY    . PACEMAKER GENERATOR CHANGE N/A 11/09/2012   Procedure: PACEMAKER GENERATOR CHANGE;  Surgeon: Thompson Grayer, MD;  Location[facility] C[facility] LAB;  Service: Cardiovascular;  Laterality: N/A;  . PACEMAKER INSERTION  03/03/2006   most recent generator (MDT) change 10/30/06 by Dr Verlon Setting   HPI:  84 y.o. female with history of afib not on AC, brady-tachy syndrome on pacemaker, DVT, HTN, stroke in 2015 admitted for worsening right sided weakness. No tPA given. Neuro workup underway. Pt had two previous MBS in 2015 after a prior stroke, both recommending Dys 1 diet and honey thick liquids by spoon. She returned for an OP MBS a few months later that recommended Dys 3 diet and nectar thick liquids by cup with potential for thin liquids or the water protocol given penetration but no aspiration observed. Her caregiver says that she has been able to eat solid foods as long as they are in bite-sized pieces but her liquids are still thickened to nectar thick consistency PTA.   Assessment / Plan / Recommendation Clinical Impression  Pt's verbal output is impaired at baseline, and her caregiver believes that this is unchanged. However she does demonstrate impaired initiation, ability to follow one-step commands, and functional problem solving, which her caregiver and daughter report are typically appropriate at home. Per prior SLP notes this does not appear to be new areas of impairment, although documentation is also from 2015 so it is unclear if pt had made progress since that time. I do also question if pt would perform better in familiar routines and environment. I  encouraged pt's caregiver and daughter to pay close attention to how she works through familiar tasks while in the hospital to determine if [facility] SLP f/u would be appropriate. Will continue to follow acutely as well.     SLP Assessment  SLP Recommendation/Assessment: Patient needs continued Speech Lanaguage  Pathology Services SLP Visit Diagnosis: Cognitive communication deficit (R41.841)    Follow Up Recommendations  Home health SLP;24 hour supervision/assistance    Frequency and Duration min 2x/week  1 week      SLP Evaluation Cognition  Overall Cognitive Status: Difficult to assess Arousal/Alertness: Awake/alert Attention: Sustained Sustained Attention: Appears intact(to simple, basic, functional tasks) Awareness: Impaired Awareness Impairment: Emergent impairment Problem Solving: Impaired Problem Solving Impairment: Functional basic       Comprehension  Auditory Comprehension Overall Auditory Comprehension: Impaired Yes/No Questions: Impaired Basic Biographical Questions: 26-50% accurate Basic Immediate Environment Questions: 25-49% accurate Commands: Impaired One Step Basic Commands: 25-49% accurate Interfering Components: Processing speed EffectiveTechniques: Visual/Gestural cues;Other (Comment)(tactile cues) Visual Recognition/Discrimination Discrimination: Not tested Reading Comprehension Reading Status: Not tested    Expression Expression Primary Mode of Expression: Verbal Verbal Expression Overall Verbal Expression: Impaired at baseline Written Expression Dominant Hand: Right   Oral / Motor  Oral Motor/Sensory Function Overall Oral Motor/Sensory Function: (difficult to assess) Motor Speech Overall Motor Speech: Impaired at baseline   GO                    Germain Osgood 03/23/2018, 4:30 PM  Germain Osgood, M.A. CCC-SLP [phone]

## 2018-03-23 NOTE — Progress Notes (Signed)
STROKE TEAM PROGRESS NOTE   SUBJECTIVE (INTERVAL HISTORY) Her caregiver is at the bedside.  Overall her condition is rapidly improving. She had stroke in 2015 and has residue of right side weakness and aphasia. However, yesterday her right side weakness worsened to the point that she can not lifted up as per caregiver. However, today, her right side weakness was minimal and back to baseline as per caregiver.   Also as per caregiver, pt has 24/7 care and walk minimally so no fall since last stroke in 2015. Not bleeding anywhere. Discussed with daughter and she understood AC has pros and cons for pt and she will like to continue baby ASA at that time.   OBJECTIVE Temp:  [97.8 F (36.6 C)-99 F (37.2 C)] 98 F (36.7 C) (04/23 1156) Pulse Rate:  [61-104] 104 (04/23 1156) Cardiac Rhythm: Atrial fibrillation (04/23 0930) Resp:  [15-23] 20 (04/23 1156) BP: (117-176)/(57-103) 140/77 (04/23 1156) SpO2:  [87 %-99 %] 98 % (04/23 1156)  Recent Labs  Lab 03/22/18 1252  GLUCAP 76   Recent Labs  Lab 03/22/18 1252 03/22/18 1255  NA 138 139  K 4.2 4.2  CL 101 98*  CO2 27  --   GLUCOSE 89 88  BUN 17 20  CREATININE 0.78 0.80  CALCIUM 8.8*  --    Recent Labs  Lab 03/22/18 1252  AST 20  ALT 12*  ALKPHOS 49  BILITOT 0.6  PROT 6.7  ALBUMIN 3.4*   Recent Labs  Lab 03/22/18 1252 03/22/18 1255  WBC 3.5*  --   NEUTROABS 1.8  --   HGB 10.9* 11.2*  HCT 33.6* 33.0*  MCV 97.7  --   PLT 149*  --    No results for input(s): CKTOTAL, CKMB, CKMBINDEX, TROPONINI in the last 168 hours. Recent Labs    03/22/18 1252  LABPROT 14.1  INR 1.09   Recent Labs    03/22/18 1733  COLORURINE YELLOW  LABSPEC 1.013  PHURINE 9.0*  GLUCOSEU NEGATIVE  HGBUR NEGATIVE  BILIRUBINUR NEGATIVE  KETONESUR NEGATIVE  PROTEINUR NEGATIVE  NITRITE NEGATIVE  LEUKOCYTESUR NEGATIVE       Component Value Date/Time   CHOL 160 03/23/2018 0329   TRIG 41 03/23/2018 0329   HDL 48 03/23/2018 0329   CHOLHDL  3.3 03/23/2018 0329   VLDL 8 03/23/2018 0329   LDLCALC 104 (H) 03/23/2018 0329   Lab Results  Component Value Date   HGBA1C 5.3 03/22/2018      Component Value Date/Time   LABOPIA NONE DETECTED 03/22/2018 1733   COCAINSCRNUR NONE DETECTED 03/22/2018 1733   LABBENZ POSITIVE (A) 03/22/2018 1733   AMPHETMU NONE DETECTED 03/22/2018 1733   THCU NONE DETECTED 03/22/2018 1733   LABBARB NONE DETECTED 03/22/2018 1733    Recent Labs  Lab 03/22/18 1252  ETH <10    I have personally reviewed the radiological images below and agree with the radiology interpretations.  Ct Head Code Stroke Wo Contrast  Result Date: 03/22/2018 CLINICAL DATA:  Code stroke.  Stroke.  Right-sided weakness, aphasia EXAM: CT HEAD WITHOUT CONTRAST TECHNIQUE: Contiguous axial images were obtained from the base of the skull through the vertex without intravenous contrast. COMPARISON:  CT head 07/05/2014 FINDINGS: Brain: Patient was scanned in the lateral decubitus position. Chronic left middle cerebral artery infarct. Cystic encephalomalacia in the left lateral basal ganglia extending into the insular cortex. Chronic infarct in the left frontal parietal lobe and in the left occipital lobe. 15 mm hypodensity right frontal white matter appears  unchanged. Negative for hemorrhage or mass. Generalized atrophy. Negative for hydrocephalus. Vascular: Negative for hyperdense vessel Skull: Negative Sinuses/Orbits: Bilateral ocular surgery. Mild mucosal edema in the right maxillary sinus. Other: None ASPECTS ([place] Stroke Program Early CT Score) - Ganglionic level infarction (caudate, lentiform nuclei, internal capsule, insula, M1-M3 cortex): 7 - Supraganglionic infarction (M4-M6 cortex): 3 Total score (0-10 with 10 being normal): 10 IMPRESSION: 1. Chronic left MCA infarct and chronic microvascular ischemia in the white matter bilaterally. No acute abnormality 2. ASPECTS is 10 3. These results were called by telephone at the time of  interpretation on 03/22/2018 at 1:12 pm to Dr. Lorraine Lax, who verbally acknowledged these results. Electronically Signed   By: Franchot Gallo M.D.   On: 03/22/2018 13:13   Carotid Doppler - pending  TTE - Vigorous LV systolic function; moderate LVH; biatrial   enlargement; mild MR and TR; mild pulmonary hypertension;   negative saline microcavitation study.  PHYSICAL EXAM  Temp:  [97.8 F (36.6 C)-99 F (37.2 C)] 98 F (36.7 C) (04/23 1156) Pulse Rate:  [61-104] 104 (04/23 1156) Resp:  [15-23] 20 (04/23 1156) BP: (117-176)/(57-103) 140/77 (04/23 1156) SpO2:  [87 %-99 %] 98 % (04/23 1156)  General - cachetic, well developed, in no apparent distress.  Ophthalmologic - fundi not visualized due to noncooperation.  Cardiovascular - irregularly irregular heart rate and rhythm.  Spine - significant scoliosis and kyphosis   Neuro - awake, alert, global aphasia, not able to answer orientation questions and not following commands except "open and close eyes". Not able to name or repeat. Blinking to visual threat to left but not to right consistently. PERRL, left gaze preference but able to cross to right but not complete gaze. No significant facial droop. Tongue midline in mouth. On strength testing, b/l proximal 4/5 symmetrical, distally finger deformity b/l but 3/5 finger grip bilaterally. BLE 3-/5 proximal and distal symmetrically. DTR diminished and no babinski. Sensation, coordination not cooperative and gait not tested.   ASSESSMENT/PLAN [name] is a 84 y.o. female with history of afib not on AC, brady-tachy syndrome on pacemaker, DVT, HTN, stroke in 2015 admitted for worsening right sided weakness. No tPA given.    TIA:  left brain TIA, embolic likely secondary to afib not on AC  Resultant back to baseline  MRI  Not able to do due to pacemaker  CT head no acute abnormality but old left MCA infarct  CT head repeat pending  Carotid Doppler  pending  2D Echo EF  65-70%  LDL 104  HgbA1c 5.3  SCDs for VTE prophylaxis  Diet NPO time specified  Fall precautions  Aspiration precautions  Seizure precautions   aspirin 81 mg daily prior to admission, now on aspirin 300 mg suppository daily. Recommend to continue ASA 81mg  on discharge.  Ongoing aggressive stroke risk factor management  Therapy recommendations:  Pending   Disposition:  Pending   History of stroke  05/2014 left MCA infarct s/p tPA, developed hemorrhagic conversion. Stroke was due to subtherapeutic INR. CUS and TTE unremarkable. LDL 68 and A1C 6.1. She was discharged to CIR  Followed with Dr. Leonie Man in clinic and decided not [facility] candidate due to fall risk, advanced age and hemorrhagic conversion s/p tPA  Afib not on [facility]  Was on coumadin but off several years ago (reasons above)  At home on ASA 81mg   Currently no falls with 24/7 caregiver  Discussed with daughter about [facility] pros and cons, and she decided against [facility] use and  would like continue ASA 81mg   Hypertension Stable  Long term BP goal normotensive  Hyperlipidemia  Home meds:  none   LDL 104, goal < 70  Now on pravastatin 20  Continue statin at discharge  Other Stroke Risk Factors  Advanced age  Hx of DVT  Other Active Problems  Brady-tachy s/p PPM   Dysphagia - did not pass swallow screening - pending speech  Hospital day # 1   Rosalin Hawking, MD PhD Stroke Neurology 03/23/2018 1:17 PM    To contact Stroke Continuity provider, please refer to [email or web address] After hours, contact General Neurology

## 2018-03-23 NOTE — Evaluation (Signed)
Occupational Therapy Evaluation Patient Details Name: Christy Miranda MRN: [id number] DOB: 07-14-1921 Today's Date: 03/23/2018    History of Present Illness 84 y.o. female with history of afib not on AC, brady-tachy syndrome on pacemaker, DVT, HTN, stroke in 2015 admitted for worsening right sided weakness. No tPA given. Neuro workup underway   Clinical Impression   PTA, pt was living with her daughter and has a daily caregiver assisting with BADLs. Pt performing self feeding and basic grooming tasks at baseline. Pt currently requiring Max A for ADLs and and functional transfers. Recommend dc home with continues 24/7 support and HHOT to optimize occupational performance and participation as well as decrease caregiver burden. All acute OT needs met and defer further OT services to [facility]; will sign off.    Follow Up Recommendations  Home health OT;Supervision/Assistance - 24 hour    Equipment Recommendations  None recommended by OT    Recommendations for Other Services PT consult;Speech consult     Precautions / Restrictions Precautions Precautions: Fall      Mobility Bed Mobility Overal bed mobility: Needs Assistance Bed Mobility: Supine to Sit     Supine to sit: Max assist     General bed mobility comments: assist to initiate LE movement to eob then rotation of hips and elevation of trunk to upright  Transfers Overall transfer level: Needs assistance Equipment used: (face to face with gait belt) Transfers: Sit to/from [employer] Transfers Sit to Stand: Max assist Stand pivot transfers: Max assist Squat pivot transfers: Max assist     General transfer comment: initially max assist with bilaterl UE support to rise to stand, physical assist for power up, poor initiation of anterior translation, LE extension impeding ability to get center of gravity aligned. Switched to one person face to face transfer for pivot to chair with gait belt and wrap around support.      Balance Overall balance assessment: Needs assistance   Sitting balance-Leahy Scale: Poor   Postural control: Posterior lean   Standing balance-Leahy Scale: Poor Standing balance comment: heavy assist in standing                           ADL either performed or assessed with clinical judgement   ADL Overall ADL's : Needs assistance/impaired Eating/Feeding: NPO   Grooming: Maximal assistance;Sitting(supported)   Upper Body Bathing: Maximal assistance;Sitting;Bed level   Lower Body Bathing: Total assistance;Bed level   Upper Body Dressing : Maximal assistance;Sitting;Bed level   Lower Body Dressing: Total assistance;Bed level   Toilet Transfer: Maximal assistance;Squat-pivot(recliner)           Functional mobility during ADLs: Maximal assistance(squat pivot) General ADL Comments: Pt requiring Max A-Total A for ADLs and Max A for squat pivot transfer to recliner     Vision         Perception     Praxis      Pertinent Vitals/Pain Pain Assessment: Faces Faces Pain Scale: No hurt Pain Intervention(s): Monitored during session     Hand Dominance Right   Extremity/Trunk Assessment Upper Extremity Assessment Upper Extremity Assessment: Generalized weakness   Lower Extremity Assessment Lower Extremity Assessment: Generalized weakness   Cervical / Trunk Assessment Cervical / Trunk Assessment: Kyphotic(noted neck fwd flexed deformity)   Communication Communication Communication: Expressive difficulties   Cognition Arousal/Alertness: Awake/alert Behavior During Therapy: Flat affect Overall Cognitive Status: History of cognitive impairments - at baseline Area of Impairment: Following commands;Problem solving  Following Commands: Follows one step commands with increased time     Problem Solving: Slow processing;Decreased initiation;Difficulty sequencing;Requires verbal cues;Requires tactile cues General Comments:  question behavioral vs cognitive   General Comments  Caregiver present throughout session. Daughter arriving at end of session    Exercises     Shoulder Instructions      Home Living Family/patient expects to be discharged to:: Private residence Living Arrangements: Other (Comment)(Private caregiver) Available Help at Discharge: Personal care attendant Type of Home: House       Home Layout: One level     Bathroom Shower/Tub: Occupational psychologist: Standard Bathroom Accessibility: Yes   Home Equipment: Environmental consultant - 2 wheels;Wheelchair - manual          Prior Functioning/Environment Level of Independence: Needs assistance  Gait / Transfers Assistance Needed: Assist with functional transfer ADL's / Homemaking Assistance Needed: Assistance for dressing, bathing, and toileting. Able to self feed and perform basic grooming tasks   Comments: has caregivers around 24/7        OT Problem List: Decreased strength;Decreased range of motion;Decreased activity tolerance;Impaired balance (sitting and/or standing);Decreased coordination;Decreased cognition;Decreased safety awareness;Decreased knowledge of precautions;Decreased knowledge of use of DME or AE;Impaired UE functional use;Pain      OT Treatment/Interventions:      OT Goals(Current goals can be found in the care plan section) Acute Rehab OT Goals Patient Stated Goal: per daughter to have swallowing improve OT Goal Formulation: With family Time For Goal Achievement: 04/06/18 Potential to Achieve Goals: Good  OT Frequency:     Barriers to D/C:            Co-evaluation PT/OT/SLP Co-Evaluation/Treatment: Yes Reason for Co-Treatment: For patient/therapist safety;To address functional/ADL transfers PT goals addressed during session: Mobility/safety with mobility OT goals addressed during session: ADL's and self-care      AM-PAC PT "6 Clicks" Daily Activity     Outcome Measure Help from another person  eating meals?: Total Help from another person taking care of personal grooming?: A Lot Help from another person toileting, which includes using toliet, bedpan, or urinal?: A Lot Help from another person bathing (including washing, rinsing, drying)?: A Lot Help from another person to put on and taking off regular upper body clothing?: A Lot Help from another person to put on and taking off regular lower body clothing?: A Lot 6 Click Score: 11   End of Session Equipment Utilized During Treatment: Gait belt Nurse Communication: Mobility status  Activity Tolerance: Patient tolerated treatment well Patient left: in chair;with call bell/phone within reach;with family/visitor present                   Time: 3354-5625 OT Time Calculation (min): 20 min Charges:  OT General Charges $OT Visit: 1 Visit OT Evaluation $OT Eval Moderate Complexity: 1 Mod G-Codes:     [place]owler, OTR/L Acute Rehab Pager: [phone] Office: [place] 03/23/2018, 3:11 PM

## 2018-03-23 NOTE — Progress Notes (Signed)
EEG complete - results pending 

## 2018-03-23 NOTE — Progress Notes (Signed)
PROGRESS NOTE  Christy Miranda  KGY:[id number] DOB: 01-13-1921 DOA: 03/22/2018 PCP: Jonathon Jordan, MD   Brief Narrative: Christy Miranda is a 84 y.o. 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 Head CT demonstrated previous left MCA stroke without acute infarct/bleeding. Neurology was consulted and tPA not given due to hx hemorrhagic conversion and poor functional status. EEG was recommended to evaluate for seizure and was nonspecific without epileptiform discharges detected. Further stroke work up is underway.   Assessment & Plan: Principal Problem:   Cerebral infarction [facility]) Active Problems:   Atrial fibrillation ([place])   Hypertension   Pacemaker-[employer]   Deep venous thrombosis of lower leg (HCC)   Undernutrition   Hypothyroidism   Stroke (cerebrum) (HCC)   Acute encephalopathy  Suspected acute CVA/TIA: Felt not to be MRI candidate due to PPM. EEG nonspecific, no epileptiform discharges.  - Dopplers, echo pending.  - PT/OT/SLP evaluations pending (NPO pending SLP). Due to significant preexisting deficits, she's got caretakers at home. CSW consulted due to possibly needing placement.  - On ASA - LDL 104, high intensity statin started. Pending goals of care discussions, this may or may not be appropriate to continue - HbA1c 5.3%  PAF: Rate controlled - Continuing off anticoagulation due to previous hemorrhagic conversion of CVA.  HTN:  - Hold home med for permissive HTN, IV hydralazine prn  Depression:  - Restart home zoloft if able to take PO safely.   Hypothyroidism:  - On IV synthroid while NPO.   DVT prophylaxis: SCDs  Code Status: DNR Family Communication: None at bedside, caretaker was at bedside this AM. Disposition Plan: Uncertain  Consultants:   Neurology  Procedures:    Echocardiogram ordered  Antimicrobials:  None   Subjective: Significant aphasia limits history. She denies any pain.   Objective: Vitals:   03/23/18 0236 03/23/18 0629 03/23/18 0933 03/23/18 1156  BP: (!) 117/57 139/67 (!) 153/68 140/77  Pulse: 92 88 92 (!) 104  Resp: 16  20 20   Temp: 99 F (37.2 C) 97.8 F (36.6 C) 98 F (36.7 C) 98 F (36.7 C)  TempSrc: Oral Oral Oral Oral  SpO2: 96% 97% 97% 98%  Weight:      Height:        Intake/Output Summary (Last 24 hours) at 03/23/2018 1301 Last data filed at 03/23/2018 0322 Gross per 24 hour  Intake 438 ml  Output -  Net 438 ml   Filed Weights   03/22/18 1312  Weight: 39.9 kg (88 lb)    Gen: Elderly female in no distress Pulm: Non-labored breathing room air. Clear to auscultation bilaterally.  CV: Irreg. No murmur, rub, or gallop. No JVD, trace pedal edema. GI: Abdomen soft, non-tender, non-distended, with normoactive bowel sounds. No organomegaly or masses felt. Ext: Warm, no deformities Skin: Small shallow abrasion on right lateral lower leg present PTA. No other findings on exposed skin. Neuro: Alert, 1-2/5 right UE, LE and 3/5 LLE, 4/5 LUE. Follows one step commands intermittently. Not completely cooperative with exam. Aphasic. Psych: Judgement and insight appear normal. Mood & affect appropriate.   Data Reviewed: I have personally reviewed following labs and imaging studies  CBC: Recent Labs  Lab 03/22/18 1252 03/22/18 1255  WBC 3.5*  --   NEUTROABS 1.8  --   HGB 10.9* 11.2*  HCT 33.6* 33.0*  MCV 97.7  --  PLT 149*  --    Basic Metabolic Panel: Recent Labs  Lab 03/22/18 1252 03/22/18 1255  NA 138 139  K 4.2 4.2  CL 101 98*  CO2 27  --   GLUCOSE 89 88  BUN 17 20  CREATININE 0.78 0.80  CALCIUM 8.8*  --    GFR: Estimated Creatinine Clearance: 26.2 mL/min (by C-G formula based on SCr of 0.8 mg/dL). Liver Function Tests: Recent Labs  Lab 03/22/18 1252  AST 20  ALT 12*  ALKPHOS 49  BILITOT  0.6  PROT 6.7  ALBUMIN 3.4*   No results for input(s): LIPASE, AMYLASE in the last 168 hours. No results for input(s): AMMONIA in the last 168 hours. Coagulation Profile: Recent Labs  Lab 03/22/18 1252  INR 1.09   Cardiac Enzymes: No results for input(s): CKTOTAL, CKMB, CKMBINDEX, TROPONINI in the last 168 hours. BNP (last 3 results) No results for input(s): PROBNP in the last 8760 hours. HbA1C: Recent Labs    03/22/18 2120  HGBA1C 5.3   CBG: Recent Labs  Lab 03/22/18 1252  GLUCAP 76   Lipid Profile: Recent Labs    03/23/18 0329  CHOL 160  HDL 48  LDLCALC 104*  TRIG 41  CHOLHDL 3.3   Thyroid Function Tests: Recent Labs    03/22/18 2120  TSH 3.494   Anemia Panel: No results for input(s): VITAMINB12, FOLATE, FERRITIN, TIBC, IRON, RETICCTPCT in the last 72 hours. Urine analysis:    Component Value Date/Time   COLORURINE YELLOW 03/22/2018 1733   APPEARANCEUR CLOUDY (A) 03/22/2018 1733   LABSPEC 1.013 03/22/2018 1733   PHURINE 9.0 (H) 03/22/2018 1733   GLUCOSEU NEGATIVE 03/22/2018 1733   HGBUR NEGATIVE 03/22/2018 1733   BILIRUBINUR NEGATIVE 03/22/2018 1733   KETONESUR NEGATIVE 03/22/2018 1733   PROTEINUR NEGATIVE 03/22/2018 1733   UROBILINOGEN 2.0 (H) 06/23/2014 1425   NITRITE NEGATIVE 03/22/2018 1733   LEUKOCYTESUR NEGATIVE 03/22/2018 1733   No results found for this or any previous visit (from the past 240 hour(s)).    Radiology Studies: Ct Head Code Stroke Wo Contrast  Result Date: 03/22/2018 CLINICAL DATA:  Code stroke.  Stroke.  Right-sided weakness, aphasia EXAM: CT HEAD WITHOUT CONTRAST TECHNIQUE: Contiguous axial images were obtained from the base of the skull through the vertex without intravenous contrast. COMPARISON:  CT head 07/05/2014 FINDINGS: Brain: Patient was scanned in the lateral decubitus position. Chronic left middle cerebral artery infarct. Cystic encephalomalacia in the left lateral basal ganglia extending into the insular cortex.  Chronic infarct in the left frontal parietal lobe and in the left occipital lobe. 15 mm hypodensity right frontal white matter appears unchanged. Negative for hemorrhage or mass. Generalized atrophy. Negative for hydrocephalus. Vascular: Negative for hyperdense vessel Skull: Negative Sinuses/Orbits: Bilateral ocular surgery. Mild mucosal edema in the right maxillary sinus. Other: None ASPECTS ([place] Stroke Program Early CT Score) - Ganglionic level infarction (caudate, lentiform nuclei, internal capsule, insula, M1-M3 cortex): 7 - Supraganglionic infarction (M4-M6 cortex): 3 Total score (0-10 with 10 being normal): 10 IMPRESSION: 1. Chronic left MCA infarct and chronic microvascular ischemia in the white matter bilaterally. No acute abnormality 2. ASPECTS is 10 3. These results were called by telephone at the time of interpretation on 03/22/2018 at 1:12 pm to Dr. Lorraine Lax, who verbally acknowledged these results. Electronically Signed   By: Franchot Gallo M.D.   On: 03/22/2018 13:13    Scheduled Meds: . aspirin  300 mg Rectal Daily  . atorvastatin  80 mg Oral q1800  .  levothyroxine  12.5 mcg Intravenous Daily   Continuous Infusions: . sodium chloride 50 mL/hr at 03/22/18 1835     LOS: 1 day   Time spent: 25 minutes.  Vance Gather, MD [facility] Pager [phone]  If 7PM-7AM, please contact night-coverage [email or web address] Password [email or web address] 03/23/2018, 1:01 PM

## 2018-03-23 NOTE — Progress Notes (Signed)
OT Cancellation Note  Patient Details Name: Christy Miranda MRN: [id number] DOB: June 06, 1921   Cancelled Treatment:    Reason Eval/Treat Not Completed: Patient at procedure or test/ unavailable(ECHO. Will return as schedule allows. Thank you.)  Cut Off, OTR/L Acute Rehab Pager: [phone] Office: [phone]  03/23/2018, 8:51 AM

## 2018-03-23 NOTE — Evaluation (Addendum)
Physical Therapy Evaluation Patient Details Name: Christy Miranda MRN: [id number] DOB: [birth year] Today's Date: 03/23/2018   History of Present Illness  84 y.o. female with history of afib not on AC, brady-tachy syndrome on pacemaker, DVT, HTN, stroke in 2015 admitted for worsening right sided weakness. No tPA given. Neuro workup underway  Clinical Impression  Orders received for PT evaluation. Patient demonstrates deficits in functional mobility as indicated below. Will benefit from continued skilled PT to address deficits and maximize function. Will see as indicated and progress as tolerated.  OF NOTE: patient requiring physical assist for all aspects of mobility however question behavior vs ability. Currently, patient from home with 24/7 caregiver support. Caregiver at bedside reports some use of w/c vs some use of RW in the home. Given current functional level feel patient would benefit from continued post acute rehabilitation but I feel the best place for this would be in the home (home health PT) given potential for mental status decline and decreased participation in an altered environment.     Follow Up Recommendations Home health PT;Supervision/Assistance - 24 hour    Equipment Recommendations  None recommended by PT    Recommendations for Other Services       Precautions / Restrictions Precautions Precautions: Fall      Mobility  Bed Mobility Overal bed mobility: Needs Assistance Bed Mobility: Supine to Sit     Supine to sit: Max assist     General bed mobility comments: assist to initiate LE movement to eob then rotation of hips and elevation of trunk to upright  Transfers Overall transfer level: Needs assistance Equipment used: (face to face with gait belt) Transfers: Sit to/from Stand;Stand Pivot Transfers Sit to Stand: Max assist Stand pivot transfers: Max assist       General transfer comment: initially max assist with bilaterl UE support to rise to stand,  physical assist for power up, poor initiation of anterior translation, LE extension impeding ability to get center of gravity aligned. Switched to one person face to face transfer for pivot to chair with gait belt and wrap around support.   Ambulation/Gait             General Gait Details: patient did not want to ambulate with therapy today  Stairs            Wheelchair Mobility    Modified Rankin (Stroke Patients Only) Modified Rankin (Stroke Patients Only) Pre-Morbid Rankin Score: Moderately severe disability Modified Rankin: Severe disability     Balance Overall balance assessment: Needs assistance   Sitting balance-Leahy Scale: Poor   Postural control: Posterior lean   Standing balance-Leahy Scale: Poor Standing balance comment: heavy assist in standing                             Pertinent Vitals/Pain Pain Assessment: Faces Faces Pain Scale: No hurt    Home Living Family/patient expects to be discharged to:: Private residence Living Arrangements: Other (Comment)(Private caregiver) Available Help at Discharge: Personal care attendant         Home Layout: One level Home Equipment: Walker - 2 wheels;Wheelchair - manual      Prior Function Level of Independence: Needs assistance         Comments: has caregivers around 24/7     Hand Dominance   Dominant Hand: Right    Extremity/Trunk Assessment   Upper Extremity Assessment Upper Extremity Assessment: Generalized weakness    Lower Extremity Assessment  Lower Extremity Assessment: Generalized weakness    Cervical / Trunk Assessment Cervical / Trunk Assessment: Kyphotic(noted neck fwd flexed deformity)  Communication   Communication: Expressive difficulties  Cognition Arousal/Alertness: Awake/alert Behavior During Therapy: Flat affect Overall Cognitive Status: History of cognitive impairments - at baseline Area of Impairment: Following commands;Problem solving                        Following Commands: Follows one step commands with increased time     Problem Solving: Slow processing;Decreased initiation;Difficulty sequencing;Requires verbal cues;Requires tactile cues General Comments: question behavioral vs cognitive      General Comments      Exercises     Assessment/Plan    PT Assessment Patient needs continued PT services  PT Problem List Decreased strength;Decreased range of motion;Decreased activity tolerance;Decreased balance;Decreased mobility;Decreased cognition;Decreased coordination;Decreased safety awareness       PT Treatment Interventions DME instruction;Gait training;Functional mobility training;Therapeutic activities;Balance training;Therapeutic exercise;Neuromuscular re-education;Patient/family education    PT Goals (Current goals can be found in the Care Plan section)  Acute Rehab PT Goals Patient Stated Goal: per daughter to have swallowing improve PT Goal Formulation: With family Time For Goal Achievement: 04/06/18 Potential to Achieve Goals: Good    Frequency Min 3X/week   Barriers to discharge        Co-evaluation PT/OT/SLP Co-Evaluation/Treatment: Yes Reason for Co-Treatment: Necessary to address cognition/behavior during functional activity;For patient/therapist safety PT goals addressed during session: Mobility/safety with mobility         AM-PAC PT "6 Clicks" Daily Activity  Outcome Measure Difficulty turning over in bed (including adjusting bedclothes, sheets and blankets)?: Unable Difficulty moving from lying on back to sitting on the side of the bed? : Unable Difficulty sitting down on and standing up from a chair with arms (e.g., wheelchair, bedside commode, etc,.)?: Unable Help needed moving to and from a bed to chair (including a wheelchair)?: A Lot Help needed walking in hospital room?: A Lot Help needed climbing 3-5 steps with a railing? : A Lot 6 Click Score: 9    End of Session  Equipment Utilized During Treatment: Gait belt   Patient left: in chair;with call bell/phone within reach;with family/visitor present Nurse Communication: Mobility status PT Visit Diagnosis: Muscle weakness (generalized) (M62.81);Difficulty in walking, not elsewhere classified (R26.2)    Time: 2505-3976 PT Time Calculation (min) (ACUTE ONLY): 20 min   Charges:   PT Evaluation $PT Eval Moderate Complexity: 1 Mod     PT G Codes:        Alben Deeds, PT DPT  Board Certified Neurologic Specialist [phone]   Duncan Dull 03/23/2018, 2:27 PM

## 2018-03-23 NOTE — Progress Notes (Addendum)
*  PRELIMINARY RESULTS* Vascular Ultrasound Carotid Duplex (Doppler) has been completed.  Study was limited due to patient body habitus and anatomy. There is no obvious evidence of hemodynamically significant stenosis involving visualized arteries.   03/23/2018 3:10 PM Maudry Mayhew, BS, RVT, RDCS, RDMS

## 2018-03-24 DIAGNOSIS — Z515 Encounter for palliative care: Secondary | ICD-10-CM

## 2018-03-24 DIAGNOSIS — R1312 Dysphagia, oropharyngeal phase: Secondary | ICD-10-CM

## 2018-03-24 DIAGNOSIS — R54 Age-related physical debility: Secondary | ICD-10-CM

## 2018-03-24 DIAGNOSIS — G459 Transient cerebral ischemic attack, unspecified: Secondary | ICD-10-CM

## 2018-03-24 DIAGNOSIS — Z7189 Other specified counseling: Secondary | ICD-10-CM

## 2018-03-24 MED ORDER — PRAVASTATIN SODIUM 20 MG PO TABS: 20.0000 mg | ORAL_TABLET | Freq: Every day | ORAL | 0 refills | Status: AC

## 2018-03-24 MED ORDER — TUBERCULIN PPD 5 UNIT/0.1ML ID SOLN
5.0000 [IU] | Freq: Once | INTRADERMAL | Status: DC
  Administered 2018-03-24: 5 [IU] via INTRADERMAL
  Filled 2018-03-24: qty 0.1

## 2018-03-24 NOTE — Social Work (Addendum)
CSW spoke with pt daughter at bedside per request of PMT. Pt daughter has been organizing placement at ALF vs Memory Care, and would not like pt to discharge home and then discharge to LTC placement. Pt daughter states that she has been in contact with Nanine Means at [facility] looking at their [place] placement. Pt daughter states that they have room and said they would need pt clinicals but "should be able to take her."   CSW explained that if pt has been medically cleared for discharge that we would not be able to hold pt in order to place pt in LTC. CSW explained that most LTC placements occurred from pt home to ALF/Memory Care.   Pt daughter displeased that there was not communication between the medical team and her prior to discharge planning. Pt daughter states that "my cousin is a physician in [place] and he said I can appeal." CSW acknowledged that pt has that right, and that this Probation officer will f/u with RN Case Manager regarding this appeal. CSW also provided supportive listening for pt daughter who verbalized feeling frustration at not fully being fully informed prior to discharge.  CSW has called Nanine Means on [facility], await return call from [place] who is in charge of Memory Care at that facility.   3:44pm- pt clinicals faxed to [facility] ALF director Judy  4:01pm- CSW spoke with Bethena Roys, they are unable to take pt until RN has come to assess pt, this will not be able to be accomplished until the morning of 4/25. Pt has been medically cleared for discharge by MD team. Alerted RN Case Manager that the RN from [facility] would have to come and TB test would have to be ordered and per pt daughter she would not like to take pt home and would like to appeal discharge. RN Case Manager will f/u with pt daughter.    Assessment RN will be Lilli Light from [facility], she will be by hospital the morning of 4/24.   Alexander Mt, [place] Work [phone]

## 2018-03-24 NOTE — Consult Note (Signed)
[facility] CM Primary Care Navigator  03/24/2018  Jaysha Lasure 1921-11-13 [id number]  Went to seepatientat the bedside to identify possible discharge needs.Met with daughter Knute Neu) and caregiver Elwin Sleight) as well. Daughter states that patient had difficulty speaking, following commands, and worsening right-sided weakness that had led to this admission.  Daughter endorses [facility] as the primary care provider.   [facility] uses [facility] on [address] to obtain medications without difficulty.  Patient's daughter and caregiver have been managing her medications at home using "pill box" system filled weekly.  Daughter and caregivers provide transportation for patient to her doctors'appointments.  Patient has private pay caregivers 24/7 at home who provides care for her.  Anticipated discharge [facility] per[facility]. Daughter mentioned that she had been considering long term care placement for a while and this time she had made her decision about it.  Patient's daughtervoiced understandingto callprimarycareprovider'soffice when patient is discharged,for a post hospital follow-upvisitwithin1- 2 weeksor sooner if needs arise, and if decision has not been made to have facility physician take patient under his care.  Patient letter (with PCP's contact number) was provided asareminder.   Primary care provider's office is listed as providing transition of care (TOC) follow-up.   For additional questions please contact:  Edwena Felty A. Paislea Hatton, BSN, RN-BC [facility] PRIMARY CARE Navigator Cell: [phone]

## 2018-03-24 NOTE — Discharge Instructions (Signed)
Dysphagia 1 (Puree);Nectar-thick liquid   Liquid Administration via: Cup Medication Administration: Crushed with puree Supervision: Staff to assist with self feeding;Full supervision/cueing for compensatory strategies Compensations: Slow rate;Small sips/bites;Monitor for anterior loss Postural Changes: Seated upright at 90 degrees

## 2018-03-24 NOTE — Progress Notes (Signed)
STROKE TEAM PROGRESS NOTE   SUBJECTIVE (INTERVAL HISTORY) Her caregiver is at the bedside.  Overall her condition is unchanged from yesterday. No acute event overnight. Repeat CT no acute findings. CUS unremarkable. PT/OT recommend home health PT/OT.   OBJECTIVE Temp:  [98 F (36.7 C)-98.4 F (36.9 C)] 98.1 F (36.7 C) (04/24 0730) Pulse Rate:  [85-109] 93 (04/24 0730) Cardiac Rhythm: Atrial fibrillation (04/24 0700) Resp:  [18-20] 18 (04/24 0730) BP: (112-144)/(62-91) 112/62 (04/24 0730) SpO2:  [93 %-98 %] 94 % (04/24 0730)  Recent Labs  Lab 03/22/18 1252  GLUCAP 76   Recent Labs  Lab 03/22/18 1252 03/22/18 1255  NA 138 139  K 4.2 4.2  CL 101 98*  CO2 27  --   GLUCOSE 89 88  BUN 17 20  CREATININE 0.78 0.80  CALCIUM 8.8*  --    Recent Labs  Lab 03/22/18 1252  AST 20  ALT 12*  ALKPHOS 49  BILITOT 0.6  PROT 6.7  ALBUMIN 3.4*   Recent Labs  Lab 03/22/18 1252 03/22/18 1255  WBC 3.5*  --   NEUTROABS 1.8  --   HGB 10.9* 11.2*  HCT 33.6* 33.0*  MCV 97.7  --   PLT 149*  --    No results for input(s): CKTOTAL, CKMB, CKMBINDEX, TROPONINI in the last 168 hours. Recent Labs    03/22/18 1252  LABPROT 14.1  INR 1.09   Recent Labs    03/22/18 1733  COLORURINE YELLOW  LABSPEC 1.013  PHURINE 9.0*  GLUCOSEU NEGATIVE  HGBUR NEGATIVE  BILIRUBINUR NEGATIVE  KETONESUR NEGATIVE  PROTEINUR NEGATIVE  NITRITE NEGATIVE  LEUKOCYTESUR NEGATIVE       Component Value Date/Time   CHOL 160 03/23/2018 0329   TRIG 41 03/23/2018 0329   HDL 48 03/23/2018 0329   CHOLHDL 3.3 03/23/2018 0329   VLDL 8 03/23/2018 0329   LDLCALC 104 (H) 03/23/2018 0329   Lab Results  Component Value Date   HGBA1C 5.3 03/22/2018      Component Value Date/Time   LABOPIA NONE DETECTED 03/22/2018 1733   COCAINSCRNUR NONE DETECTED 03/22/2018 1733   LABBENZ POSITIVE (A) 03/22/2018 1733   AMPHETMU NONE DETECTED 03/22/2018 1733   THCU NONE DETECTED 03/22/2018 1733   LABBARB NONE DETECTED  03/22/2018 1733    Recent Labs  Lab 03/22/18 1252  ETH <10    I have personally reviewed the radiological images below and agree with the radiology interpretations.  Ct Head Wo Contrast  Result Date: 03/23/2018 CLINICAL DATA:  84 y/o F; history of atrial fibrillation. High risk for CVA or TIA. History of right-sided weakness and aphasia. EXAM: CT HEAD WITHOUT CONTRAST TECHNIQUE: Contiguous axial images were obtained from the base of the skull through the vertex without intravenous contrast. COMPARISON:  03/22/2018 CT of the head. FINDINGS: Brain: Stable chronic infarctions within the left occipital lobe in the left MCA distribution. Stable patchy chronic microvascular ischemic changes and parenchymal volume loss of the brain. No large acute stroke, hemorrhage, or focal mass effect identified. No extra-axial collection, hydrocephalus, or effacement of basilar cisterns. Vascular: Calcific atherosclerosis of carotid siphons. No hyperdense vessel identified. Skull: Normal. Negative for fracture or focal lesion. Sinuses/Orbits: No acute finding. Other: None. IMPRESSION: 1. No acute intracranial abnormality identified. 2. Stable chronic infarcts in the left MCA distribution in the left occipital lobe. Stable chronic microvascular ischemic changes and parenchymal volume loss of the brain. Electronically Signed   By: Kristine Garbe M.D.   On: 03/23/2018 17:44  Ct Head Code Stroke Wo Contrast  Result Date: 03/22/2018 CLINICAL DATA:  Code stroke.  Stroke.  Right-sided weakness, aphasia EXAM: CT HEAD WITHOUT CONTRAST TECHNIQUE: Contiguous axial images were obtained from the base of the skull through the vertex without intravenous contrast. COMPARISON:  CT head 07/05/2014 FINDINGS: Brain: Patient was scanned in the lateral decubitus position. Chronic left middle cerebral artery infarct. Cystic encephalomalacia in the left lateral basal ganglia extending into the insular cortex. Chronic infarct in the  left frontal parietal lobe and in the left occipital lobe. 15 mm hypodensity right frontal white matter appears unchanged. Negative for hemorrhage or mass. Generalized atrophy. Negative for hydrocephalus. Vascular: Negative for hyperdense vessel Skull: Negative Sinuses/Orbits: Bilateral ocular surgery. Mild mucosal edema in the right maxillary sinus. Other: None ASPECTS ([place] Stroke Program Early CT Score) - Ganglionic level infarction (caudate, lentiform nuclei, internal capsule, insula, M1-M3 cortex): 7 - Supraganglionic infarction (M4-M6 cortex): 3 Total score (0-10 with 10 being normal): 10 IMPRESSION: 1. Chronic left MCA infarct and chronic microvascular ischemia in the white matter bilaterally. No acute abnormality 2. ASPECTS is 10 3. These results were called by telephone at the time of interpretation on 03/22/2018 at 1:12 pm to Dr. Lorraine Lax, who verbally acknowledged these results. Electronically Signed   By: Franchot Gallo M.D.   On: 03/22/2018 13:13   Carotid Doppler  Study was limited due to patient body habitus and anatomy. There is no obvious evidence of hemodynamically significant stenosis involving visualized arteries.   TTE - Vigorous LV systolic function; moderate LVH; biatrial   enlargement; mild MR and TR; mild pulmonary hypertension;   negative saline microcavitation study.   PHYSICAL EXAM  Temp:  [98 F (36.7 C)-98.4 F (36.9 C)] 98.1 F (36.7 C) (04/24 0730) Pulse Rate:  [85-109] 93 (04/24 0730) Resp:  [18-20] 18 (04/24 0730) BP: (112-144)/(62-91) 112/62 (04/24 0730) SpO2:  [93 %-98 %] 94 % (04/24 0730)  General - cachetic, well developed, in no apparent distress.  Ophthalmologic - fundi not visualized due to noncooperation.  Cardiovascular - irregularly irregular heart rate and rhythm.  Spine - significant scoliosis and kyphosis   Neuro - awake, alert, global aphasia, not able to answer orientation questions and not following commands except "open and close eyes".  Not able to name or repeat. Blinking to visual threat to left but not to right consistently. PERRL, left gaze preference but able to cross to right but not complete gaze. No significant facial droop. Tongue midline in mouth. On strength testing, b/l proximal 4/5 symmetrical, distally finger deformity b/l but 3/5 finger grip bilaterally. BLE 3-/5 proximal and distal symmetrically. DTR diminished and no babinski. Sensation, coordination not cooperative and gait not tested.   ASSESSMENT/PLAN Ms. Otilia Kareem is a 84 y.o. female with history of afib not on AC, brady-tachy syndrome on pacemaker, DVT, HTN, stroke in 2015 admitted for worsening right sided weakness. No tPA given.    TIA:  left brain TIA, embolic likely secondary to afib not on AC  Resultant back to baseline  MRI  Not able to do due to pacemaker  CT head no acute abnormality but old left MCA infarct  CT head repeat no acute abnormalities  Carotid Doppler unremarkable  2D Echo EF 65-70%  LDL 104  HgbA1c 5.3  SCDs for VTE prophylaxis Fall precautions Aspiration precautions Seizure precautions  DIET - DYS 1 Room service appropriate? Yes; Fluid consistency: Nectar Thick   aspirin 81 mg daily prior to admission, now on aspirin 300 mg  suppository daily. Recommend to continue ASA 81mg  on discharge.  Ongoing aggressive stroke risk factor management  Therapy recommendations:  HH PT/OT  Disposition:  Pending   History of stroke  05/2014 left MCA infarct s/p tPA, developed hemorrhagic conversion. Stroke was due to subtherapeutic INR. CUS and TTE unremarkable. LDL 68 and A1C 6.1. She was discharged to CIR  Followed with Dr. Leonie Man in clinic and decided not [facility] candidate due to fall risk, advanced age and hemorrhagic conversion s/p tPA  Afib not on [facility]  Was on coumadin but off several years ago (reasons above)  At home on ASA 81mg   Currently no falls with 24/7 caregiver  Discussed with daughter about [facility] pros and cons,  and she decided against AC use and would like continue ASA 81mg   Hypertension Stable  Long term BP goal normotensive  Hyperlipidemia  Home meds:  none   LDL 104, goal < 70  Now on pravastatin 20  Continue statin at discharge  Other Stroke Risk Factors  Advanced age  Hx of DVT  Other Active Problems  Brady-tachy s/p PPM   Dysphagia - passed swallow yesterday on dys 1 diet and nectar thick liquid  Hospital day # 2  Neurology will sign off. Please call with questions. Pt will follow up with stroke clinic NP at [facility] in about 4 weeks. Thanks for the consult.   Rosalin Hawking, MD PhD Stroke Neurology 03/24/2018 10:14 AM    To contact Stroke Continuity provider, please refer to [email or web address] After hours, contact General Neurology

## 2018-03-24 NOTE — Discharge Summary (Addendum)
Physician Discharge Summary  Christy Miranda OZH:[id number] DOB: 1921/01/06 DOA: 03/22/2018  PCP: Jonathon Jordan, MD  Admit date: 03/22/2018 Discharge date: 03/24/2018  Admitted From: Home Disposition: Home   Recommendations for Outpatient Follow-up:  1. Follow up with neurology as outpatient.  2. Needs a memory care level of care. She qualifies for this due to vascular dementia.  3. Continue [place] discussions, started statin and continued aspirin 81mg .  Home Health: Continue 24/7 care Equipment/Devices: None new Discharge Condition: Stable CODE STATUS: DNR Diet recommendation: Dysphagia 1 (Puree);Nectar-thick liquid  Brief/Interim Summary: Christy Miranda is a 84 y.o. 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 Head CT demonstrated previous left MCA stroke without acute infarct/bleeding. Neurology was consulted and tPA not given due to hx hemorrhagic conversion and poor functional status. EEG was recommended to evaluate for seizure and was nonspecific without epileptiform discharges detected. MRI was not pursued due to PPM. Repeat CT head demonstrated stability, again without new acute findings. Work up was completed without intervenable findings. AFter discussion with family, anticoagulation remains on hold. Her caretakers report she is back to her baseline and PT recommended returning home with ongoing 24/7 home care services, continuing aspirin 81mg , started statin.   Discharge Diagnoses:  Principal Problem:   Cerebral infarction [facility]) Active Problems:   Atrial fibrillation ([place])   Hypertension   Pacemaker-[employer]   Deep venous thrombosis of lower leg (HCC)   Undernutrition   Hypothyroidism   Stroke (cerebrum) (HCC)   Acute encephalopathy   Advanced age   TIA (transient ischemic attack)   Oropharyngeal  dysphagia  Suspected acute CVA/TIA: Felt not to be MRI candidate due to PPM. EEG nonspecific, no epileptiform discharges.  - Functional status has returned to baseline.  - On ASA 81mg  PTA and continued after discussions with family. - LDL 104, pravastatin started. - HbA1c 5.3%  Vascular dementia: Symptoms present even prior to first significant stroke and has displayed evidence of cognitive impairment to the severity qualifying for vascular dementia Dx. Neurological impairments preclude formal neurocognitive testing/MMSE/MOCA.  - No focused therapy recommended (e.g. aricpet, namenda)  PAF: Rate controlled - Continuing off anticoagulation due to previous hemorrhagic conversion of CVA.  HTN:  - Restart medications with goal of normotension  Depression:  - Restart home zoloft  Hypothyroidism:  - Continue synthroid  Discharge Instructions Discharge Instructions    Ambulatory referral to Neurology   Complete by:  As directed    Follow up with stroke clinic NP (Jessica Vanschaick or Cecille Rubin, if both not available, consider Zachery Dauer, or [place]) at [facility] in about 4 weeks. Thanks.   Diet - low sodium heart healthy   Complete by:  As directed    Discharge instructions   Complete by:  As directed    You were admitted for stroke symptoms. No definite stroke was seen on any imaging tests, and no secondary cause of stroke was found, so it is suspected that AFib may be the cause. You are too high risk for blood thinners, so you will need to continue aspirin 81mg  daily. To further reduce your risk of stroke, it is recommended that you start taking pravastatin daily (sent to your pharmacy) to help lower cholesterol.  - Follow up with your PCP and with neurology, or seek medical care if symptoms return.   Increase activity slowly   Complete by:  As  directed      Allergies as of 03/24/2018   No Known Allergies     Medication List    TAKE these medications   acetaminophen 325  MG tablet Commonly known as:  TYLENOL Take 1 tablet (325 mg total) by mouth every 6 (six) hours as needed for mild pain, fever or headache.   ALPRAZolam 0.5 MG tablet Commonly known as:  XANAX TAKE 1/2 TABLET (0.25) 3 TIMES A DAY AS NEEDED FOR ANXIETY   aspirin 81 MG tablet Take 81 mg by mouth daily.   levothyroxine 50 MCG tablet Commonly known as:  SYNTHROID, LEVOTHROID Take 1 tablet (50 mcg total) by mouth daily. What changed:  how much to take   metoprolol tartrate 50 MG tablet Commonly known as:  LOPRESSOR Take 1 tablet (50 mg total) by mouth 2 (two) times daily.   pantoprazole 40 MG tablet Commonly known as:  PROTONIX Take 40 mg by mouth daily.   pravastatin 20 MG tablet Commonly known as:  PRAVACHOL Take 1 tablet (20 mg total) by mouth daily at 6 PM.   sertraline 50 MG tablet Commonly known as:  ZOLOFT Take 50 mg by mouth daily.   vitamin B-12 1000 MCG tablet Commonly known as:  CYANOCOBALAMIN Take 1,000 mcg by mouth daily.      Follow-up Information    [facility]. Schedule an appointment as soon as possible for a visit in 4 week(s).   Specialty:  Radiology Contact information: [address] [place] [phone]       Jonathon Jordan, MD Follow up.   Specialty:  Family Medicine Contact information: [address] [place] [address] [phone]          No Known Allergies  Consultations:  Stroke neurology, Dr. Erlinda Hong  Procedures/Studies: Ct Head Wo Contrast  Result Date: 03/23/2018 CLINICAL DATA:  84 y/o F; history of atrial fibrillation. High risk for CVA or TIA. History of right-sided weakness and aphasia. EXAM: CT HEAD WITHOUT CONTRAST TECHNIQUE: Contiguous axial images were obtained from the base of the skull through the vertex without intravenous contrast. COMPARISON:  03/22/2018 CT of the head. FINDINGS: Brain: Stable chronic infarctions within the left  occipital lobe in the left MCA distribution. Stable patchy chronic microvascular ischemic changes and parenchymal volume loss of the brain. No large acute stroke, hemorrhage, or focal mass effect identified. No extra-axial collection, hydrocephalus, or effacement of basilar cisterns. Vascular: Calcific atherosclerosis of carotid siphons. No hyperdense vessel identified. Skull: Normal. Negative for fracture or focal lesion. Sinuses/Orbits: No acute finding. Other: None. IMPRESSION: 1. No acute intracranial abnormality identified. 2. Stable chronic infarcts in the left MCA distribution in the left occipital lobe. Stable chronic microvascular ischemic changes and parenchymal volume loss of the brain. Electronically Signed   By: Kristine Garbe M.D.   On: 03/23/2018 17:44   Ct Head Code Stroke Wo Contrast  Result Date: 03/22/2018 CLINICAL DATA:  Code stroke.  Stroke.  Right-sided weakness, aphasia EXAM: CT HEAD WITHOUT CONTRAST TECHNIQUE: Contiguous axial images were obtained from the base of the skull through the vertex without intravenous contrast. COMPARISON:  CT head 07/05/2014 FINDINGS: Brain: Patient was scanned in the lateral decubitus position. Chronic left middle cerebral artery infarct. Cystic encephalomalacia in the left lateral basal ganglia extending into the insular cortex. Chronic infarct in the left frontal parietal lobe and in the left occipital lobe. 15 mm hypodensity right frontal white matter appears unchanged. Negative for  hemorrhage or mass. Generalized atrophy. Negative for hydrocephalus. Vascular: Negative for hyperdense vessel Skull: Negative Sinuses/Orbits: Bilateral ocular surgery. Mild mucosal edema in the right maxillary sinus. Other: None ASPECTS ([place] Stroke Program Early CT Score) - Ganglionic level infarction (caudate, lentiform nuclei, internal capsule, insula, M1-M3 cortex): 7 - Supraganglionic infarction (M4-M6 cortex): 3 Total score (0-10 with 10 being normal): 10  IMPRESSION: 1. Chronic left MCA infarct and chronic microvascular ischemia in the white matter bilaterally. No acute abnormality 2. ASPECTS is 10 3. These results were called by telephone at the time of interpretation on 03/22/2018 at 1:12 pm to Dr. Lorraine Lax, who verbally acknowledged these results. Electronically Signed   By: Franchot Gallo M.D.   On: 03/22/2018 13:13   Echocardiogram 03/23/2018: - Left ventricle: The cavity size was normal. Wall thickness was   increased in a pattern of moderate LVH. Systolic function was   vigorous. The estimated ejection fraction was in the range of 65%   to 70%. Wall motion was normal; there were no regional wall   motion abnormalities. - Mitral valve: Calcified annulus. There was mild regurgitation. - Left atrium: The atrium was severely dilated. - Right atrium: The atrium was mildly dilated. - Pulmonary arteries: Systolic pressure was mildly increased. - Pericardium, extracardiac: A trivial pericardial effusion was   identified.  Impressions:  - Vigorous LV systolic function; moderate LVH; biatrial   enlargement; mild MR and TR; mild pulmonary hypertension;   negative saline microcavitation study.  CAROTID US 03/23/2018: Final Interpretation: Right Carotid: Study was limited due to patient body habitus and anatomy. There        is no obvious evidence of hemodynamically significant stenosis        involving visualized arteries. See table(s) above for more        details.  Left Carotid: Study was limited due to patient body habitus and anatomy. There       is no obvious evidence of hemodynamically significant stenosis       involving visualized arteries. See table(s) above for more       details.  Subjective: No changes, back to baseline per caretaker at bedside. No events overnight. Tolerating dysphagia diet.   Discharge Exam: Vitals:   03/24/18 0351 03/24/18 0730  BP: 133/76 112/62  Pulse: (!)  102 93  Resp: 18 18  Temp: 98 F (36.7 C) 98.1 F (36.7 C)  SpO2: 93% 94%   General: Pt is alert, awake, not in acute distress Cardiovascular: RRR, S1/S2 +, no rubs, no gallops Respiratory: CTA bilaterally, no wheezing, no rhonchi Abdominal: Soft, NT, ND, bowel sounds + Extremities: No edema, no cyanosis. Small shallow abrasion on right lateral lower leg present PTA. No other findings on exposed skin. Neuro: Alert, 1-2/5 right UE, LE and 3/5 LLE, 4/5 LUE. Follows one step commands intermittently. Not completely cooperative with exam. Aphasic.  Labs: BNP (last 3 results) No results for input(s): BNP in the last 8760 hours. Basic Metabolic Panel: Recent Labs  Lab 03/22/18 1252 03/22/18 1255  NA 138 139  K 4.2 4.2  CL 101 98*  CO2 27  --   GLUCOSE 89 88  BUN 17 20  CREATININE 0.78 0.80  CALCIUM 8.8*  --    Liver Function Tests: Recent Labs  Lab 03/22/18 1252  AST 20  ALT 12*  ALKPHOS 49  BILITOT 0.6  PROT 6.7  ALBUMIN 3.4*   No results for input(s): LIPASE, AMYLASE in the last 168 hours. No results for input(s):  AMMONIA in the last 168 hours. CBC: Recent Labs  Lab 03/22/18 1252 03/22/18 1255  WBC 3.5*  --   NEUTROABS 1.8  --   HGB 10.9* 11.2*  HCT 33.6* 33.0*  MCV 97.7  --   PLT 149*  --    Cardiac Enzymes: No results for input(s): CKTOTAL, CKMB, CKMBINDEX, TROPONINI in the last 168 hours. BNP: Invalid input(s): POCBNP CBG: Recent Labs  Lab 03/22/18 1252  GLUCAP 76   D-Dimer No results for input(s): DDIMER in the last 72 hours. Hgb A1c Recent Labs    03/22/18 2120  HGBA1C 5.3   Lipid Profile Recent Labs    03/23/18 0329  CHOL 160  HDL 48  LDLCALC 104*  TRIG 41  CHOLHDL 3.3   Thyroid function studies Recent Labs    03/22/18 2120  TSH 3.494   Anemia work up No results for input(s): VITAMINB12, FOLATE, FERRITIN, TIBC, IRON, RETICCTPCT in the last 72 hours. Urinalysis    Component Value Date/Time   COLORURINE YELLOW 03/22/2018  1733   APPEARANCEUR CLOUDY (A) 03/22/2018 1733   LABSPEC 1.013 03/22/2018 1733   PHURINE 9.0 (H) 03/22/2018 1733   GLUCOSEU NEGATIVE 03/22/2018 1733   HGBUR NEGATIVE 03/22/2018 1733   BILIRUBINUR NEGATIVE 03/22/2018 1733   KETONESUR NEGATIVE 03/22/2018 1733   PROTEINUR NEGATIVE 03/22/2018 1733   UROBILINOGEN 2.0 (H) 06/23/2014 1425   NITRITE NEGATIVE 03/22/2018 1733   LEUKOCYTESUR NEGATIVE 03/22/2018 1733    Microbiology No results found for this or any previous visit (from the past 240 hour(s)).  Time coordinating discharge: Approximately 40 minutes  Vance Gather, MD  [facility] 03/24/2018, 10:47 AM Pager [phone]

## 2018-03-24 NOTE — Progress Notes (Signed)
  Speech Language Pathology Treatment: Dysphagia;Cognitive-Linquistic  Patient Details Name: Christy Miranda MRN: [id number] DOB: Nov 30, 1921 Today's Date: 03/24/2018 Time: 3300-7622 SLP Time Calculation (min) (ACUTE ONLY): 18 min  Assessment / Plan / Recommendation Clinical Impression  Pt was seen during self feeding from lunch tray. Pt's oral phase was improved from previous evaluation, as indicated by ability to completely manipulate regular solids, aided by dentures and Min verbal cues from SLP to use liquid wash to clear oral cavity. Further, pt had no overt signs/symptoms concerning for aspiration. Given improved oral phase, Caregiver report that she is back to baseline, and no s/s for aspiration recommend upgrading pt to baseline diet of Dysphagia 3 (mechanical soft) with meats chopped, nectar thick liquids, and aspiration precautions (sitting upright, eating when alert, small bites/sips, slow intake). Recommend pills crushed in puree. Pt's daughter and caregiver in room with pt and educated on Nectar thick liquids and aspiration precautions. SLP will follow up for further education and assessment of pt's safety with upgraded diet.   Pt's daughter and caregiver report that pt's language/cognition is back to baseline, as indicated by pt's ability to follow one step commands during functional task with Min verbal/visual cues. Therefore, no further SLP follow indicated for cognition/language.       HPI HPI: 84 y.o. female with history of afib not on AC, brady-tachy syndrome on pacemaker, DVT, HTN, stroke in 2015 admitted for worsening right sided weakness. No tPA given. Neuro workup underway. Pt had two previous MBS in 2015 after a prior stroke, both recommending Dys 1 diet and honey thick liquids by spoon. She returned for an OP MBS a few months later that recommended Dys 3 diet and nectar thick liquids by cup with potential for thin liquids or the water protocol given penetration but no aspiration  observed. Her caregiver says that she has been able to eat solid foods as long as they are in bite-sized pieces but her liquids are still thickened to nectar thick consistency PTA.      SLP Plan  Continue with current plan of care       Recommendations  Diet recommendations: Dysphagia 3 (mechanical soft);Nectar-thick liquid Liquids provided via: Straw;Cup Medication Administration: Crushed with puree Supervision: Patient able to self feed;Intermittent supervision to cue for compensatory strategies Compensations: Slow rate;Small sips/bites Postural Changes and/or Swallow Maneuvers: Seated upright 90 degrees                Oral Care Recommendations: Oral care BID Follow up Recommendations: 24 hour supervision/assistance SLP Visit Diagnosis: Dysphagia, unspecified (R13.10);Cognitive communication deficit (R41.841) Plan: Continue with current plan of care       GO               [place] Sreeja Spies SLP Student Clinician  [place] Quasean Frye 03/24/2018, 3:44 PM

## 2018-03-24 NOTE — Progress Notes (Signed)
Palliative:  I had multiple calls from daughter, Christy Miranda, who is distressed as she has been informed that her mother is being discharged today. She shares with me that she has been exploring facility placement for her mother prior to admission. She says that she has been working with Christy Miranda for placement in [facility] and has been told that they have a bed available. I contacted [facility] who has been EXTREMELY helpful!  I went back to visit with Christy Miranda who is sleeping with caregiver and Christy Miranda at bedside. Joined by Dr. Bonner Miranda and [facility]. Explanation with Christy Miranda regarding discharge provided. Emotional support provided.   Goals of care are clear with desire to focus on comfort and treating the treatable. Desire transfer to facility for care. They are also clear they would NOT desire feeding tube if indicated in the future. [name] is mostly back to her baseline. Please call for further palliative needs.   25 min  Vinie Sill, NP Palliative Medicine Team Pager # [phone] (M-F 8a-5p) Team Phone # [phone] (Nights/Weekends)

## 2018-03-24 NOTE — NC FL2 (Signed)
[place] MEDICAID FL2 LEVEL OF CARE SCREENING TOOL     IDENTIFICATION  Patient Name: Christy Miranda Birthdate: 1921-02-27 Sex: female Admission Date (Current Location): 03/22/2018  [facility] and [place] Number:  Herbalist and Address:  [facility], [place] [address] [place], [place] [address]      Provider Number: [id number]  Attending Physician Name and Address:  Patrecia Pour, MD  Relative Name and Phone Number:  Alger Simons, [phone]    Current Level of Care: Hospital Recommended Level of Care: Memory Care Prior Approval Number:    Date Approved/Denied:   PASRR Number:    Discharge Plan: Other (Comment)(Memory Care)    Current Diagnoses: Patient Active Problem List   Diagnosis Date Noted  . Acute encephalopathy   . Advanced age   . TIA (transient ischemic attack)   . Oropharyngeal dysphagia   . Hypothyroidism 03/22/2018  . Stroke (cerebrum) ([place]) 03/22/2018  . Chest pain at rest 08/02/2015  . Cerebral infarction ([place]) 06/19/2014  . Undernutrition 06/19/2014  . Fracture of multiple pubic rami ([place]) 05/22/2014  . Encounter for therapeutic drug monitoring 01/02/2014  . Dizziness and giddiness 07/22/2013  . Thrombocytopenia ([place]) 11/16/2012  . Deep venous thrombosis of lower leg ([place]) 11/13/2012  . Subtherapeutic international normalized ratio (INR) 11/13/2012  . Pancytopenia ([place]) 11/13/2012  . Edema 11/07/2012  . Pacemaker-Medtronic 08/19/2012  . Current use of long term anticoagulation 08/08/2011  . Atrial fibrillation ([place]) 02/28/2011  . Tachycardia-bradycardia syndrome ([place]) 02/28/2011  . Hypertension 02/28/2011    Orientation RESPIRATION BLADDER Height & Weight     (Unable to Assess due to Communication Barriers)  Normal Incontinent Weight: 88 lb (39.9 kg) Height:  4\' 9"  (144.8 cm)  BEHAVIORAL SYMPTOMS/MOOD NEUROLOGICAL BOWEL NUTRITION STATUS      Continent Diet (pureed diet)  AMBULATORY STATUS COMMUNICATION OF  NEEDS Skin   Extensive Assist Does not communicate(is slurred in speak, unable to assess needs verbally) Normal                       Personal Care Assistance Level of Assistance  Bathing, Feeding, Dressing Bathing Assistance: Maximum assistance Feeding assistance: Independent Dressing Assistance: Maximum assistance     Functional Limitations Info  Sight, Hearing, Speech     Speech Info: Impaired(slurred)    SPECIAL CARE FACTORS FREQUENCY  PT (By licensed PT), OT (By licensed OT), Speech therapy     PT Frequency: 3/wk with home health OT Frequency: 3/wk with home health     Speech Therapy Frequency: 2x week for 1 week      Contractures Contractures Info: Not present    Additional Factors Info  Code Status, Allergies, Psychotropic Code Status Info: DNR Allergies Info: No Known Allergies Psychotropic Info: sertraline (ZOLOFT) tablet 50 mg PO daily         Current Medications (03/24/2018):  This is the current hospital active medication list Current Facility-Administered Medications  Medication Dose Route Frequency Provider Last Rate Last Dose  . 0.9 %  sodium chloride infusion   Intravenous Continuous Rondel Jumbo, PA-C 50 mL/hr at 03/22/18 1835    . ALPRAZolam Duanne Moron) tablet 0.25 mg  0.25 mg Oral TID PRN Patrecia Pour, MD   0.25 mg at 03/24/18 0037  . aspirin suppository 300 mg  300 mg Rectal Daily Rondel Jumbo, PA-C   300 mg at 03/24/18 1039  . hydrALAZINE (APRESOLINE) injection 5 mg  5 mg Intravenous Q8H PRN Wertman,  Coralee Pesa, PA-C      . levothyroxine (SYNTHROID, LEVOTHROID) tablet 25 mcg  25 mcg Oral QAC breakfast Patrecia Pour, MD   25 mcg at 03/24/18 0539  . pantoprazole (PROTONIX) EC tablet 40 mg  40 mg Oral Daily Patrecia Pour, MD   40 mg at 03/24/18 1039  . pravastatin (PRAVACHOL) tablet 20 mg  20 mg Oral q1800 Rosalin Hawking, MD   20 mg at 03/23/18 1648  . [place]   Oral PRN Patrecia Pour, MD      . sertraline (ZOLOFT) tablet 50 mg  50  mg Oral Daily Patrecia Pour, MD   50 mg at 03/24/18 1039     Discharge Medications: Please see discharge summary for a list of discharge medications.  Relevant Imaging Results:  Relevant Lab Results:   Additional Information SS# [place], [place]

## 2018-03-25 NOTE — Progress Notes (Signed)
Patient seen at the bedside this morning. No changes/events overnight reported by caretaker. Ate breakfast with assistance, continues to be at baseline. She is stable for discharge as soon as a safe and appropriate venue can be found. I appreciate the multidisciplinary work being done on behalf of this patient. I have called her daughter and left a voicemail message, left 3W phone number for call back if there is anything she needs from me I will be happy to help. Otherwise, please see DC summary from yesterday.   Vance Gather, MD 03/25/2018 11:33 AM

## 2018-03-25 NOTE — Social Work (Addendum)
CSW spoke with intake RN from [facility]. She has completed her assessment of pt and is sending her information and clinicals to the regional RN for determination of appropriateness of pt for Memory Care.   Of note per intake RN [place]- "pt does not have diagnosis of dementia which is generally appropriate for entrance in Memory Care setting." Intake RN shared that she feels pt may be more appropriate for SNF. However, clinicals are being reviewed and we will have determination hopefully "within a few hours."  3:30pm- CSW spoke with [facility], still no determination made for pt. At this time, if pt daughter chooses to appeal discharge she is able to still do so and await determination. Aware PT states she could be functionally safe with current caregivers at home. Pt did not meet medical criteria for 3 night inpatient stay for Medicare purposes to discharge to SNF.   3:54pm- Received call from [facility] at [place], she states that pt clinicals are still under review at [facility] office. Bethena Roys states that they feel that pt will not qualify for memory care as she does not have diagnosis formally of dementia or memory loss and due to pt inability to communicate clearly it is hard to assess this also. Pt also from [facility]'s stand point has declined since they have been following pt, they feel they cannot provide level of care pt needs.  4:10pm- CSW met with pt, pt caregiver, and pt daughter with RN Case Manager. RN Case Manager and CSW explained that pt was likely to not meet the standards that they were needing for admission to Memory Care (too acute of care needs and no diagnosis of dementia). Pt daughter understandably frustrated by this development, states she had cancelled her caregivers at home. Pt daughter asked about SNF, RN Case Manager and CSW spoke to the 3 night medically qualifying stay piece of that requirement and also noted that PT/OT recommendations indicate they  believe pt would be safe at home with caregivers and home health. Pt daughter verbally declining home health, states she would like to talk with physician and will call caregivers to set up care at home. Pt daughter will transport pt home in personal vehicle. CSW and RN Case Management have offered all possible services, offered sincere apologies for not being able to place pt at level of care desired by pt daughter.   CSW signing off. Please consult if any additional needs arise.  Alexander Mt, [place] Work [phone]

## 2018-03-25 NOTE — Progress Notes (Signed)
Initial Nutrition Assessment  DOCUMENTATION CODES:   Not applicable  INTERVENTION:  Magic cup TID with meals, each supplement provides 290 kcal and 9 grams of protein  NUTRITION DIAGNOSIS:   Increased nutrient needs related to acute illness as evidenced by estimated needs.  GOAL:   Patient will meet greater than or equal to 90% of their needs  MONITOR:   PO intake, Supplement acceptance  REASON FOR ASSESSMENT:   Malnutrition Screening Tool    ASSESSMENT:   Christy Miranda is a 84 y.o. female with a Past Medical History of CVA; afib not on AC; HTN; and pacemaker placement who presents with code stroke - dysarthria, difficulty interacting, difficulty following commands. CT showed a chronic ischemic stroke in the left MCA distribution  Spoke with patient, daughter, and aide at bedside. Aide reports that PTA she ate "everything" for the most part. Normally will eat a banana with pancakes or waffles or a bowl of cereal for breakfast, 1/2 a peanut butter sandwich and a cup of fruit or yogurt for lunch and a frozen dinner like alfredo or spaghetti for dinner.   RD attempted to learn weight history from daughter and aide but they were unsure. They state she has seen a steady decline from 120 pounds over the past 4-5 years since aide began working with her. Per chart she was 97 pounds as of 06/12/16, now 88 pounds. No clear weight history over the past 2 years.  She does not like ensure or boost per family. Was eating peanut butter crackers during visit. Had a brownie at bedside. Daughter declines snacks at this time. Patient does like magic cup however, ordered for patient in healthtouch.  Unable to diagnose malnutrition at this time.   Labs reviewed Medications reviewed   NUTRITION - FOCUSED PHYSICAL EXAM:    Most Recent Value  Orbital Region  Moderate depletion  Upper Arm Region  No depletion  Thoracic and Lumbar Region  No depletion  Buccal Region  No depletion  Temple  Region  Moderate depletion  Clavicle Bone Region  Severe depletion  Clavicle and Acromion Bone Region  Severe depletion  Scapular Bone Region  Moderate depletion  Dorsal Hand  Moderate depletion  Patellar Region  Moderate depletion  Anterior Thigh Region  Moderate depletion  Posterior Calf Region  Mild depletion       Diet Order:  Fall precautions Aspiration precautions Seizure precautions Diet - low sodium heart healthy DIET DYS 3 Room service appropriate? Yes; Fluid consistency: Nectar Thick  EDUCATION NEEDS:   Not appropriate for education at this time  Skin:  Skin Assessment: Reviewed RN Assessment  Last BM:  PTA  Height:   Ht Readings from Last 1 Encounters:  03/22/18 4\' 9"  (1.448 m)    Weight:   Wt Readings from Last 1 Encounters:  03/22/18 88 lb (39.9 kg)    Ideal Body Weight:  43.18 kg  BMI:  Body mass index is 19.04 kg/m.  Estimated Nutritional Needs:   Kcal:  1200-1400 calories  Protein:  52-64 grams (1.3-1.6g/kg)  Fluid:  >1.5L  Satira Anis. Kamara Allan, MS, RD LDN Inpatient Clinical Dietitian Pager [phone]

## 2018-03-25 NOTE — Progress Notes (Signed)
  Speech Language Pathology Treatment: Dysphagia  Patient Details Name: Christy Miranda MRN: [id number] DOB: [birth year] Today's Date: 03/25/2018 Time: 6803-2122 SLP Time Calculation (min) (ACUTE ONLY): 13 min  Assessment / Plan / Recommendation Clinical Impression  Pt was observed with po intake with RN present. RN reports pt tolerating current diet (Dys3/nectar thick liquids) without difficulty, and no overt s/s aspiration were observed at this time. Safe swallow precautions were updated at [facility] and reviewed with pt/caregiver. Recommended to caregiver and RN that safe swallow precautions be sent with pt upon DC.   HPI HPI: 84 y.o. female with history of afib not on AC, brady-tachy syndrome on pacemaker, DVT, HTN, stroke in 2015 admitted for worsening right sided weakness. No tPA given. Neuro workup underway. Pt had two previous MBS in 2015 after a prior stroke, both recommending Dys 1 diet and honey thick liquids by spoon. She returned for an OP MBS a few months later that recommended Dys 3 diet and nectar thick liquids by cup with potential for thin liquids or the water protocol given penetration but no aspiration observed. Her caregiver says that she has been able to eat solid foods as long as they are in bite-sized pieces but her liquids are still thickened to nectar thick consistency PTA.      SLP Plan  Continue with current plan of care       Recommendations  Diet recommendations: Dysphagia 3 (mechanical soft);Nectar-thick liquid Liquids provided via: Cup Medication Administration: Crushed with puree Supervision: Patient able to self feed;Intermittent supervision to cue for compensatory strategies Compensations: Slow rate;Small sips/bites Postural Changes and/or Swallow Maneuvers: Seated upright 90 degrees;Upright 30-60 min after meal                Oral Care Recommendations: Oral care BID Follow up Recommendations: 24 hour supervision/assistance SLP Visit Diagnosis: Dysphagia,  unspecified (R13.10) Plan: Continue with current plan of care       GO               Trevonne Nyland B. Quentin Ore [facility], CCC-SLP Speech Language Pathologist [phone]  Shonna Chock 03/25/2018, 10:50 AM

## 2018-03-25 NOTE — Progress Notes (Signed)
Physical Therapy Treatment Patient Details Name: Christy Miranda MRN: [id number] DOB: [birth year] Today's Date: 03/25/2018    History of Present Illness 84 y.o. female with history of afib not on AC, brady-tachy syndrome on pacemaker, DVT, HTN, stroke in 2015 admitted for worsening right sided weakness. No tPA given. Neuro workup underway    PT Comments    Patient agreeable to participate in therapy. Pt required mod A for bed mobility, functional transfer, and gait this session. Caregiver present. Continue to progress as tolerated.    Follow Up Recommendations  Home health PT;Supervision/Assistance - 24 hour     Equipment Recommendations  None recommended by PT    Recommendations for Other Services       Precautions / Restrictions Precautions Precautions: Fall    Mobility  Bed Mobility Overal bed mobility: Needs Assistance Bed Mobility: Supine to Sit     Supine to sit: Max assist;HOB elevated     General bed mobility comments: assist to bring hips to EOB and to elevate trunk into sitting; verbal and tactile cues needed to bring bilat LE to EOB  Transfers Overall transfer level: Needs assistance Equipment used: Rolling walker (2 wheeled) Transfers: Sit to/from Stand Sit to Stand: Mod assist         General transfer comment: pt likes to use bilat hands on RW; assist to power up into standing and for balance upon standing as pt has posterior bias  Ambulation/Gait Ambulation/Gait assistance: Mod assist Ambulation Distance (Feet): 12 Feet Assistive device: Rolling walker (2 wheeled) Gait Pattern/deviations: Step-through pattern;Decreased step length - right;Decreased step length - left;Shuffle;Trunk flexed;Narrow base of support;Leaning posteriorly Gait velocity: decreased   General Gait Details: max A required initial 2ft due to posterior lean however with distance pt required less assist for balance; cues for increased bilat step lengths and forward gaze; assist to  manage RW    Stairs             Wheelchair Mobility    Modified Rankin (Stroke Patients Only) Modified Rankin (Stroke Patients Only) Pre-Morbid Rankin Score: Moderately severe disability Modified Rankin: Moderately severe disability     Balance Overall balance assessment: Needs assistance   Sitting balance-Leahy Scale: Poor Sitting balance - Comments: pt able to maintain sitting balance EOB with min guard     Standing balance-Leahy Scale: Poor Standing balance comment: heavy assist in standing                            Cognition Arousal/Alertness: Awake/alert Behavior During Therapy: Flat affect Overall Cognitive Status: Difficult to assess Area of Impairment: Following commands;Problem solving                       Following Commands: Follows one step commands with increased time     Problem Solving: Slow processing;Decreased initiation;Difficulty sequencing;Requires verbal cues;Requires tactile cues        Exercises      General Comments General comments (skin integrity, edema, etc.): caregiver present during session      Pertinent Vitals/Pain Pain Assessment: No/denies pain Faces Pain Scale: No hurt    Home Living                      Prior Function            PT Goals (current goals can now be found in the care plan section) Acute Rehab PT Goals PT Goal Formulation: With family  Time For Goal Achievement: 04/06/18 Potential to Achieve Goals: Good Progress towards PT goals: Progressing toward goals    Frequency    Min 3X/week      PT Plan Current plan remains appropriate    Co-evaluation              AM-PAC PT "6 Clicks" Daily Activity  Outcome Measure  Difficulty turning over in bed (including adjusting bedclothes, sheets and blankets)?: Unable Difficulty moving from lying on back to sitting on the side of the bed? : Unable Difficulty sitting down on and standing up from a chair with arms  (e.g., wheelchair, bedside commode, etc,.)?: Unable Help needed moving to and from a bed to chair (including a wheelchair)?: A Lot Help needed walking in hospital room?: A Lot Help needed climbing 3-5 steps with a railing? : A Lot 6 Click Score: 9    End of Session Equipment Utilized During Treatment: Gait belt Activity Tolerance: Patient tolerated treatment well Patient left: in chair;with call bell/phone within reach;with family/visitor present Nurse Communication: Mobility status PT Visit Diagnosis: Muscle weakness (generalized) (M62.81);Difficulty in walking, not elsewhere classified (R26.2)     Time: 6301-6010 PT Time Calculation (min) (ACUTE ONLY): 20 min  Charges:  $Gait Training: 8-22 mins                    G Codes:       Earney Navy, PTA Pager: [phone]     Darliss Cheney 03/25/2018, 1:20 PM

## 2018-03-25 NOTE — Care Management Note (Signed)
Case Management Note  Patient Details  Name: Christy Miranda MRN: [id number] Date of Birth: [birth year]  Subjective/Objective:                    Action/Plan: Daughter expecting patient to d/c to [facility]. She has received work and so has CSW that they most likely will not accept Ms [place] as a resident.  CM and CSW met with the patient and her daughter and the daughter has decided to take the patient home with caregivers. Daughter to call and arrange for the prior caregivers to provide 24/7 support.  Pt with orders for [facility] services. Daughter currently is declining these services. She feels the aides can provide what the patient needs.  CM inquired about transportation home. Daughter plans on taking her home in private vehicle.   Expected Discharge Date:  03/24/18               Expected Discharge Plan:  Assisted Living / Rest Home  In-House Referral:  Clinical Social Work  Discharge planning Services  CM Consult  Post Acute Care Choice:    Choice offered to:     DME Arranged:    DME Agency:     HH Arranged:    HH Agency:     Status of Service:  Completed, signed off  If discussed at [employer], dates discussed:    Additional Comments:  Pollie Friar, RN 03/25/2018, 4:44 PM

## 2018-03-31 DIAGNOSIS — R1312 Dysphagia, oropharyngeal phase: Secondary | ICD-10-CM | POA: Diagnosis not present

## 2018-03-31 DIAGNOSIS — I10 Essential (primary) hypertension: Secondary | ICD-10-CM | POA: Diagnosis not present

## 2018-03-31 DIAGNOSIS — I679 Cerebrovascular disease, unspecified: Secondary | ICD-10-CM | POA: Diagnosis not present

## 2018-03-31 DIAGNOSIS — I69991 Dysphagia following unspecified cerebrovascular disease: Secondary | ICD-10-CM | POA: Diagnosis not present

## 2018-03-31 DIAGNOSIS — F0150 Vascular dementia without behavioral disturbance: Secondary | ICD-10-CM | POA: Diagnosis not present

## 2018-03-31 DIAGNOSIS — I482 Chronic atrial fibrillation: Secondary | ICD-10-CM | POA: Diagnosis not present

## 2018-03-31 DIAGNOSIS — I69351 Hemiplegia and hemiparesis following cerebral infarction affecting right dominant side: Secondary | ICD-10-CM | POA: Diagnosis not present

## 2018-03-31 DIAGNOSIS — S81801D Unspecified open wound, right lower leg, subsequent encounter: Secondary | ICD-10-CM | POA: Diagnosis not present

## 2018-03-31 DIAGNOSIS — I69311 Memory deficit following cerebral infarction: Secondary | ICD-10-CM | POA: Diagnosis not present

## 2018-04-01 DIAGNOSIS — I10 Essential (primary) hypertension: Secondary | ICD-10-CM | POA: Diagnosis not present

## 2018-04-01 DIAGNOSIS — I69991 Dysphagia following unspecified cerebrovascular disease: Secondary | ICD-10-CM | POA: Diagnosis not present

## 2018-04-01 DIAGNOSIS — R1312 Dysphagia, oropharyngeal phase: Secondary | ICD-10-CM | POA: Diagnosis not present

## 2018-04-01 DIAGNOSIS — F0150 Vascular dementia without behavioral disturbance: Secondary | ICD-10-CM | POA: Diagnosis not present

## 2018-04-01 DIAGNOSIS — I69351 Hemiplegia and hemiparesis following cerebral infarction affecting right dominant side: Secondary | ICD-10-CM | POA: Diagnosis not present

## 2018-04-01 DIAGNOSIS — I69311 Memory deficit following cerebral infarction: Secondary | ICD-10-CM | POA: Diagnosis not present

## 2018-04-02 DIAGNOSIS — I69351 Hemiplegia and hemiparesis following cerebral infarction affecting right dominant side: Secondary | ICD-10-CM | POA: Diagnosis not present

## 2018-04-02 DIAGNOSIS — I10 Essential (primary) hypertension: Secondary | ICD-10-CM | POA: Diagnosis not present

## 2018-04-02 DIAGNOSIS — I69311 Memory deficit following cerebral infarction: Secondary | ICD-10-CM | POA: Diagnosis not present

## 2018-04-02 DIAGNOSIS — F0150 Vascular dementia without behavioral disturbance: Secondary | ICD-10-CM | POA: Diagnosis not present

## 2018-04-02 DIAGNOSIS — R1312 Dysphagia, oropharyngeal phase: Secondary | ICD-10-CM | POA: Diagnosis not present

## 2018-04-02 DIAGNOSIS — I69991 Dysphagia following unspecified cerebrovascular disease: Secondary | ICD-10-CM | POA: Diagnosis not present

## 2018-04-05 DIAGNOSIS — I69991 Dysphagia following unspecified cerebrovascular disease: Secondary | ICD-10-CM | POA: Diagnosis not present

## 2018-04-05 DIAGNOSIS — I69311 Memory deficit following cerebral infarction: Secondary | ICD-10-CM | POA: Diagnosis not present

## 2018-04-05 DIAGNOSIS — F418 Other specified anxiety disorders: Secondary | ICD-10-CM | POA: Diagnosis not present

## 2018-04-05 DIAGNOSIS — I4891 Unspecified atrial fibrillation: Secondary | ICD-10-CM | POA: Diagnosis not present

## 2018-04-05 DIAGNOSIS — E7849 Other hyperlipidemia: Secondary | ICD-10-CM | POA: Diagnosis not present

## 2018-04-05 DIAGNOSIS — I10 Essential (primary) hypertension: Secondary | ICD-10-CM | POA: Diagnosis not present

## 2018-04-05 DIAGNOSIS — R1312 Dysphagia, oropharyngeal phase: Secondary | ICD-10-CM | POA: Diagnosis not present

## 2018-04-05 DIAGNOSIS — F0150 Vascular dementia without behavioral disturbance: Secondary | ICD-10-CM | POA: Diagnosis not present

## 2018-04-05 DIAGNOSIS — E785 Hyperlipidemia, unspecified: Secondary | ICD-10-CM | POA: Diagnosis not present

## 2018-04-05 DIAGNOSIS — E038 Other specified hypothyroidism: Secondary | ICD-10-CM | POA: Diagnosis not present

## 2018-04-05 DIAGNOSIS — I6389 Other cerebral infarction: Secondary | ICD-10-CM | POA: Diagnosis not present

## 2018-04-05 DIAGNOSIS — I69351 Hemiplegia and hemiparesis following cerebral infarction affecting right dominant side: Secondary | ICD-10-CM | POA: Diagnosis not present

## 2018-04-05 DIAGNOSIS — F3289 Other specified depressive episodes: Secondary | ICD-10-CM | POA: Diagnosis not present

## 2018-04-07 DIAGNOSIS — I495 Sick sinus syndrome: Secondary | ICD-10-CM | POA: Diagnosis not present

## 2018-04-07 DIAGNOSIS — R1310 Dysphagia, unspecified: Secondary | ICD-10-CM | POA: Diagnosis not present

## 2018-04-07 DIAGNOSIS — E039 Hypothyroidism, unspecified: Secondary | ICD-10-CM | POA: Diagnosis not present

## 2018-04-07 DIAGNOSIS — Z86718 Personal history of other venous thrombosis and embolism: Secondary | ICD-10-CM | POA: Diagnosis not present

## 2018-04-07 DIAGNOSIS — E785 Hyperlipidemia, unspecified: Secondary | ICD-10-CM | POA: Diagnosis not present

## 2018-04-07 DIAGNOSIS — K219 Gastro-esophageal reflux disease without esophagitis: Secondary | ICD-10-CM | POA: Diagnosis not present

## 2018-04-07 DIAGNOSIS — M2450 Contracture, unspecified joint: Secondary | ICD-10-CM | POA: Diagnosis not present

## 2018-04-07 DIAGNOSIS — I679 Cerebrovascular disease, unspecified: Secondary | ICD-10-CM | POA: Diagnosis not present

## 2018-04-07 DIAGNOSIS — R54 Age-related physical debility: Secondary | ICD-10-CM | POA: Diagnosis not present

## 2018-04-07 DIAGNOSIS — I4891 Unspecified atrial fibrillation: Secondary | ICD-10-CM | POA: Diagnosis not present

## 2018-04-07 DIAGNOSIS — I10 Essential (primary) hypertension: Secondary | ICD-10-CM | POA: Diagnosis not present

## 2018-04-07 DIAGNOSIS — G459 Transient cerebral ischemic attack, unspecified: Secondary | ICD-10-CM | POA: Diagnosis not present

## 2018-04-07 DIAGNOSIS — F0150 Vascular dementia without behavioral disturbance: Secondary | ICD-10-CM | POA: Diagnosis not present

## 2018-04-07 DIAGNOSIS — F339 Major depressive disorder, recurrent, unspecified: Secondary | ICD-10-CM | POA: Diagnosis not present

## 2018-04-07 DIAGNOSIS — E46 Unspecified protein-calorie malnutrition: Secondary | ICD-10-CM | POA: Diagnosis not present

## 2018-04-09 DIAGNOSIS — F0150 Vascular dementia without behavioral disturbance: Secondary | ICD-10-CM | POA: Diagnosis not present

## 2018-04-09 DIAGNOSIS — I679 Cerebrovascular disease, unspecified: Secondary | ICD-10-CM | POA: Diagnosis not present

## 2018-04-09 DIAGNOSIS — I10 Essential (primary) hypertension: Secondary | ICD-10-CM | POA: Diagnosis not present

## 2018-04-09 DIAGNOSIS — G459 Transient cerebral ischemic attack, unspecified: Secondary | ICD-10-CM | POA: Diagnosis not present

## 2018-04-09 DIAGNOSIS — I495 Sick sinus syndrome: Secondary | ICD-10-CM | POA: Diagnosis not present

## 2018-04-09 DIAGNOSIS — I4891 Unspecified atrial fibrillation: Secondary | ICD-10-CM | POA: Diagnosis not present

## 2018-04-12 DIAGNOSIS — G459 Transient cerebral ischemic attack, unspecified: Secondary | ICD-10-CM | POA: Diagnosis not present

## 2018-04-12 DIAGNOSIS — F0150 Vascular dementia without behavioral disturbance: Secondary | ICD-10-CM | POA: Diagnosis not present

## 2018-04-12 DIAGNOSIS — I495 Sick sinus syndrome: Secondary | ICD-10-CM | POA: Diagnosis not present

## 2018-04-12 DIAGNOSIS — I10 Essential (primary) hypertension: Secondary | ICD-10-CM | POA: Diagnosis not present

## 2018-04-12 DIAGNOSIS — I679 Cerebrovascular disease, unspecified: Secondary | ICD-10-CM | POA: Diagnosis not present

## 2018-04-12 DIAGNOSIS — I4891 Unspecified atrial fibrillation: Secondary | ICD-10-CM | POA: Diagnosis not present

## 2018-04-16 DIAGNOSIS — I679 Cerebrovascular disease, unspecified: Secondary | ICD-10-CM | POA: Diagnosis not present

## 2018-04-16 DIAGNOSIS — I495 Sick sinus syndrome: Secondary | ICD-10-CM | POA: Diagnosis not present

## 2018-04-16 DIAGNOSIS — G459 Transient cerebral ischemic attack, unspecified: Secondary | ICD-10-CM | POA: Diagnosis not present

## 2018-04-16 DIAGNOSIS — F0150 Vascular dementia without behavioral disturbance: Secondary | ICD-10-CM | POA: Diagnosis not present

## 2018-04-16 DIAGNOSIS — I10 Essential (primary) hypertension: Secondary | ICD-10-CM | POA: Diagnosis not present

## 2018-04-16 DIAGNOSIS — I4891 Unspecified atrial fibrillation: Secondary | ICD-10-CM | POA: Diagnosis not present

## 2018-04-19 DIAGNOSIS — I495 Sick sinus syndrome: Secondary | ICD-10-CM | POA: Diagnosis not present

## 2018-04-19 DIAGNOSIS — G459 Transient cerebral ischemic attack, unspecified: Secondary | ICD-10-CM | POA: Diagnosis not present

## 2018-04-19 DIAGNOSIS — I679 Cerebrovascular disease, unspecified: Secondary | ICD-10-CM | POA: Diagnosis not present

## 2018-04-19 DIAGNOSIS — I4891 Unspecified atrial fibrillation: Secondary | ICD-10-CM | POA: Diagnosis not present

## 2018-04-19 DIAGNOSIS — F0150 Vascular dementia without behavioral disturbance: Secondary | ICD-10-CM | POA: Diagnosis not present

## 2018-04-19 DIAGNOSIS — I10 Essential (primary) hypertension: Secondary | ICD-10-CM | POA: Diagnosis not present

## 2018-04-27 DIAGNOSIS — F0150 Vascular dementia without behavioral disturbance: Secondary | ICD-10-CM | POA: Diagnosis not present

## 2018-04-27 DIAGNOSIS — I679 Cerebrovascular disease, unspecified: Secondary | ICD-10-CM | POA: Diagnosis not present

## 2018-04-27 DIAGNOSIS — I4891 Unspecified atrial fibrillation: Secondary | ICD-10-CM | POA: Diagnosis not present

## 2018-04-27 DIAGNOSIS — I10 Essential (primary) hypertension: Secondary | ICD-10-CM | POA: Diagnosis not present

## 2018-04-27 DIAGNOSIS — G459 Transient cerebral ischemic attack, unspecified: Secondary | ICD-10-CM | POA: Diagnosis not present

## 2018-04-27 DIAGNOSIS — I495 Sick sinus syndrome: Secondary | ICD-10-CM | POA: Diagnosis not present

## 2018-04-30 DIAGNOSIS — I10 Essential (primary) hypertension: Secondary | ICD-10-CM | POA: Diagnosis not present

## 2018-04-30 DIAGNOSIS — I495 Sick sinus syndrome: Secondary | ICD-10-CM | POA: Diagnosis not present

## 2018-04-30 DIAGNOSIS — I679 Cerebrovascular disease, unspecified: Secondary | ICD-10-CM | POA: Diagnosis not present

## 2018-04-30 DIAGNOSIS — G459 Transient cerebral ischemic attack, unspecified: Secondary | ICD-10-CM | POA: Diagnosis not present

## 2018-04-30 DIAGNOSIS — I4891 Unspecified atrial fibrillation: Secondary | ICD-10-CM | POA: Diagnosis not present

## 2018-04-30 DIAGNOSIS — F0150 Vascular dementia without behavioral disturbance: Secondary | ICD-10-CM | POA: Diagnosis not present

## 2018-05-01 DIAGNOSIS — F339 Major depressive disorder, recurrent, unspecified: Secondary | ICD-10-CM | POA: Diagnosis not present

## 2018-05-01 DIAGNOSIS — R1310 Dysphagia, unspecified: Secondary | ICD-10-CM | POA: Diagnosis not present

## 2018-05-01 DIAGNOSIS — K219 Gastro-esophageal reflux disease without esophagitis: Secondary | ICD-10-CM | POA: Diagnosis not present

## 2018-05-01 DIAGNOSIS — Z86718 Personal history of other venous thrombosis and embolism: Secondary | ICD-10-CM | POA: Diagnosis not present

## 2018-05-01 DIAGNOSIS — I495 Sick sinus syndrome: Secondary | ICD-10-CM | POA: Diagnosis not present

## 2018-05-01 DIAGNOSIS — I679 Cerebrovascular disease, unspecified: Secondary | ICD-10-CM | POA: Diagnosis not present

## 2018-05-01 DIAGNOSIS — R54 Age-related physical debility: Secondary | ICD-10-CM | POA: Diagnosis not present

## 2018-05-01 DIAGNOSIS — F0150 Vascular dementia without behavioral disturbance: Secondary | ICD-10-CM | POA: Diagnosis not present

## 2018-05-01 DIAGNOSIS — E785 Hyperlipidemia, unspecified: Secondary | ICD-10-CM | POA: Diagnosis not present

## 2018-05-01 DIAGNOSIS — G459 Transient cerebral ischemic attack, unspecified: Secondary | ICD-10-CM | POA: Diagnosis not present

## 2018-05-01 DIAGNOSIS — I4891 Unspecified atrial fibrillation: Secondary | ICD-10-CM | POA: Diagnosis not present

## 2018-05-01 DIAGNOSIS — E039 Hypothyroidism, unspecified: Secondary | ICD-10-CM | POA: Diagnosis not present

## 2018-05-01 DIAGNOSIS — I10 Essential (primary) hypertension: Secondary | ICD-10-CM | POA: Diagnosis not present

## 2018-05-01 DIAGNOSIS — M2450 Contracture, unspecified joint: Secondary | ICD-10-CM | POA: Diagnosis not present

## 2018-05-01 DIAGNOSIS — E46 Unspecified protein-calorie malnutrition: Secondary | ICD-10-CM | POA: Diagnosis not present

## 2018-05-03 DIAGNOSIS — I10 Essential (primary) hypertension: Secondary | ICD-10-CM | POA: Diagnosis not present

## 2018-05-03 DIAGNOSIS — F0150 Vascular dementia without behavioral disturbance: Secondary | ICD-10-CM | POA: Diagnosis not present

## 2018-05-03 DIAGNOSIS — I495 Sick sinus syndrome: Secondary | ICD-10-CM | POA: Diagnosis not present

## 2018-05-03 DIAGNOSIS — I4891 Unspecified atrial fibrillation: Secondary | ICD-10-CM | POA: Diagnosis not present

## 2018-05-03 DIAGNOSIS — G459 Transient cerebral ischemic attack, unspecified: Secondary | ICD-10-CM | POA: Diagnosis not present

## 2018-05-03 DIAGNOSIS — I679 Cerebrovascular disease, unspecified: Secondary | ICD-10-CM | POA: Diagnosis not present

## 2018-05-06 DIAGNOSIS — I679 Cerebrovascular disease, unspecified: Secondary | ICD-10-CM | POA: Diagnosis not present

## 2018-05-06 DIAGNOSIS — F0150 Vascular dementia without behavioral disturbance: Secondary | ICD-10-CM | POA: Diagnosis not present

## 2018-05-06 DIAGNOSIS — I10 Essential (primary) hypertension: Secondary | ICD-10-CM | POA: Diagnosis not present

## 2018-05-06 DIAGNOSIS — I4891 Unspecified atrial fibrillation: Secondary | ICD-10-CM | POA: Diagnosis not present

## 2018-05-06 DIAGNOSIS — I495 Sick sinus syndrome: Secondary | ICD-10-CM | POA: Diagnosis not present

## 2018-05-06 DIAGNOSIS — G459 Transient cerebral ischemic attack, unspecified: Secondary | ICD-10-CM | POA: Diagnosis not present

## 2018-05-10 DIAGNOSIS — I679 Cerebrovascular disease, unspecified: Secondary | ICD-10-CM | POA: Diagnosis not present

## 2018-05-10 DIAGNOSIS — F0150 Vascular dementia without behavioral disturbance: Secondary | ICD-10-CM | POA: Diagnosis not present

## 2018-05-10 DIAGNOSIS — I495 Sick sinus syndrome: Secondary | ICD-10-CM | POA: Diagnosis not present

## 2018-05-10 DIAGNOSIS — G459 Transient cerebral ischemic attack, unspecified: Secondary | ICD-10-CM | POA: Diagnosis not present

## 2018-05-10 DIAGNOSIS — I10 Essential (primary) hypertension: Secondary | ICD-10-CM | POA: Diagnosis not present

## 2018-05-10 DIAGNOSIS — I4891 Unspecified atrial fibrillation: Secondary | ICD-10-CM | POA: Diagnosis not present

## 2018-05-14 DIAGNOSIS — I10 Essential (primary) hypertension: Secondary | ICD-10-CM | POA: Diagnosis not present

## 2018-05-14 DIAGNOSIS — I4891 Unspecified atrial fibrillation: Secondary | ICD-10-CM | POA: Diagnosis not present

## 2018-05-14 DIAGNOSIS — I495 Sick sinus syndrome: Secondary | ICD-10-CM | POA: Diagnosis not present

## 2018-05-14 DIAGNOSIS — I679 Cerebrovascular disease, unspecified: Secondary | ICD-10-CM | POA: Diagnosis not present

## 2018-05-14 DIAGNOSIS — F0150 Vascular dementia without behavioral disturbance: Secondary | ICD-10-CM | POA: Diagnosis not present

## 2018-05-14 DIAGNOSIS — G459 Transient cerebral ischemic attack, unspecified: Secondary | ICD-10-CM | POA: Diagnosis not present

## 2018-05-17 DIAGNOSIS — G459 Transient cerebral ischemic attack, unspecified: Secondary | ICD-10-CM | POA: Diagnosis not present

## 2018-05-17 DIAGNOSIS — F0150 Vascular dementia without behavioral disturbance: Secondary | ICD-10-CM | POA: Diagnosis not present

## 2018-05-17 DIAGNOSIS — I4891 Unspecified atrial fibrillation: Secondary | ICD-10-CM | POA: Diagnosis not present

## 2018-05-17 DIAGNOSIS — I679 Cerebrovascular disease, unspecified: Secondary | ICD-10-CM | POA: Diagnosis not present

## 2018-05-17 DIAGNOSIS — I495 Sick sinus syndrome: Secondary | ICD-10-CM | POA: Diagnosis not present

## 2018-05-17 DIAGNOSIS — I10 Essential (primary) hypertension: Secondary | ICD-10-CM | POA: Diagnosis not present

## 2018-05-19 DIAGNOSIS — I10 Essential (primary) hypertension: Secondary | ICD-10-CM | POA: Diagnosis not present

## 2018-05-19 DIAGNOSIS — G459 Transient cerebral ischemic attack, unspecified: Secondary | ICD-10-CM | POA: Diagnosis not present

## 2018-05-19 DIAGNOSIS — F0150 Vascular dementia without behavioral disturbance: Secondary | ICD-10-CM | POA: Diagnosis not present

## 2018-05-19 DIAGNOSIS — I679 Cerebrovascular disease, unspecified: Secondary | ICD-10-CM | POA: Diagnosis not present

## 2018-05-19 DIAGNOSIS — I4891 Unspecified atrial fibrillation: Secondary | ICD-10-CM | POA: Diagnosis not present

## 2018-05-19 DIAGNOSIS — I495 Sick sinus syndrome: Secondary | ICD-10-CM | POA: Diagnosis not present

## 2018-05-24 DIAGNOSIS — I679 Cerebrovascular disease, unspecified: Secondary | ICD-10-CM | POA: Diagnosis not present

## 2018-05-24 DIAGNOSIS — I10 Essential (primary) hypertension: Secondary | ICD-10-CM | POA: Diagnosis not present

## 2018-05-24 DIAGNOSIS — I495 Sick sinus syndrome: Secondary | ICD-10-CM | POA: Diagnosis not present

## 2018-05-24 DIAGNOSIS — G459 Transient cerebral ischemic attack, unspecified: Secondary | ICD-10-CM | POA: Diagnosis not present

## 2018-05-24 DIAGNOSIS — F0150 Vascular dementia without behavioral disturbance: Secondary | ICD-10-CM | POA: Diagnosis not present

## 2018-05-24 DIAGNOSIS — I4891 Unspecified atrial fibrillation: Secondary | ICD-10-CM | POA: Diagnosis not present

## 2018-05-26 DIAGNOSIS — F0150 Vascular dementia without behavioral disturbance: Secondary | ICD-10-CM | POA: Diagnosis not present

## 2018-05-26 DIAGNOSIS — I10 Essential (primary) hypertension: Secondary | ICD-10-CM | POA: Diagnosis not present

## 2018-05-26 DIAGNOSIS — I495 Sick sinus syndrome: Secondary | ICD-10-CM | POA: Diagnosis not present

## 2018-05-26 DIAGNOSIS — I4891 Unspecified atrial fibrillation: Secondary | ICD-10-CM | POA: Diagnosis not present

## 2018-05-26 DIAGNOSIS — G459 Transient cerebral ischemic attack, unspecified: Secondary | ICD-10-CM | POA: Diagnosis not present

## 2018-05-26 DIAGNOSIS — I679 Cerebrovascular disease, unspecified: Secondary | ICD-10-CM | POA: Diagnosis not present

## 2018-05-28 DIAGNOSIS — G459 Transient cerebral ischemic attack, unspecified: Secondary | ICD-10-CM | POA: Diagnosis not present

## 2018-05-28 DIAGNOSIS — I679 Cerebrovascular disease, unspecified: Secondary | ICD-10-CM | POA: Diagnosis not present

## 2018-05-28 DIAGNOSIS — F0150 Vascular dementia without behavioral disturbance: Secondary | ICD-10-CM | POA: Diagnosis not present

## 2018-05-28 DIAGNOSIS — I10 Essential (primary) hypertension: Secondary | ICD-10-CM | POA: Diagnosis not present

## 2018-05-28 DIAGNOSIS — I495 Sick sinus syndrome: Secondary | ICD-10-CM | POA: Diagnosis not present

## 2018-05-28 DIAGNOSIS — I4891 Unspecified atrial fibrillation: Secondary | ICD-10-CM | POA: Diagnosis not present

## 2018-05-31 DIAGNOSIS — I495 Sick sinus syndrome: Secondary | ICD-10-CM | POA: Diagnosis not present

## 2018-05-31 DIAGNOSIS — G459 Transient cerebral ischemic attack, unspecified: Secondary | ICD-10-CM | POA: Diagnosis not present

## 2018-05-31 DIAGNOSIS — Z86718 Personal history of other venous thrombosis and embolism: Secondary | ICD-10-CM | POA: Diagnosis not present

## 2018-05-31 DIAGNOSIS — K219 Gastro-esophageal reflux disease without esophagitis: Secondary | ICD-10-CM | POA: Diagnosis not present

## 2018-05-31 DIAGNOSIS — I679 Cerebrovascular disease, unspecified: Secondary | ICD-10-CM | POA: Diagnosis not present

## 2018-05-31 DIAGNOSIS — M2450 Contracture, unspecified joint: Secondary | ICD-10-CM | POA: Diagnosis not present

## 2018-05-31 DIAGNOSIS — E785 Hyperlipidemia, unspecified: Secondary | ICD-10-CM | POA: Diagnosis not present

## 2018-05-31 DIAGNOSIS — E039 Hypothyroidism, unspecified: Secondary | ICD-10-CM | POA: Diagnosis not present

## 2018-05-31 DIAGNOSIS — I10 Essential (primary) hypertension: Secondary | ICD-10-CM | POA: Diagnosis not present

## 2018-05-31 DIAGNOSIS — F339 Major depressive disorder, recurrent, unspecified: Secondary | ICD-10-CM | POA: Diagnosis not present

## 2018-05-31 DIAGNOSIS — I4891 Unspecified atrial fibrillation: Secondary | ICD-10-CM | POA: Diagnosis not present

## 2018-05-31 DIAGNOSIS — R1310 Dysphagia, unspecified: Secondary | ICD-10-CM | POA: Diagnosis not present

## 2018-05-31 DIAGNOSIS — F0150 Vascular dementia without behavioral disturbance: Secondary | ICD-10-CM | POA: Diagnosis not present

## 2018-05-31 DIAGNOSIS — E46 Unspecified protein-calorie malnutrition: Secondary | ICD-10-CM | POA: Diagnosis not present

## 2018-05-31 DIAGNOSIS — R54 Age-related physical debility: Secondary | ICD-10-CM | POA: Diagnosis not present

## 2018-06-02 DIAGNOSIS — F0150 Vascular dementia without behavioral disturbance: Secondary | ICD-10-CM | POA: Diagnosis not present

## 2018-06-02 DIAGNOSIS — I679 Cerebrovascular disease, unspecified: Secondary | ICD-10-CM | POA: Diagnosis not present

## 2018-06-02 DIAGNOSIS — G459 Transient cerebral ischemic attack, unspecified: Secondary | ICD-10-CM | POA: Diagnosis not present

## 2018-06-02 DIAGNOSIS — I495 Sick sinus syndrome: Secondary | ICD-10-CM | POA: Diagnosis not present

## 2018-06-02 DIAGNOSIS — I4891 Unspecified atrial fibrillation: Secondary | ICD-10-CM | POA: Diagnosis not present

## 2018-06-02 DIAGNOSIS — I10 Essential (primary) hypertension: Secondary | ICD-10-CM | POA: Diagnosis not present

## 2018-06-04 DIAGNOSIS — F0150 Vascular dementia without behavioral disturbance: Secondary | ICD-10-CM | POA: Diagnosis not present

## 2018-06-04 DIAGNOSIS — I10 Essential (primary) hypertension: Secondary | ICD-10-CM | POA: Diagnosis not present

## 2018-06-04 DIAGNOSIS — I6930 Unspecified sequelae of cerebral infarction: Secondary | ICD-10-CM | POA: Diagnosis not present

## 2018-06-04 DIAGNOSIS — G459 Transient cerebral ischemic attack, unspecified: Secondary | ICD-10-CM | POA: Diagnosis not present

## 2018-06-04 DIAGNOSIS — I495 Sick sinus syndrome: Secondary | ICD-10-CM | POA: Diagnosis not present

## 2018-06-04 DIAGNOSIS — R05 Cough: Secondary | ICD-10-CM | POA: Diagnosis not present

## 2018-06-04 DIAGNOSIS — I679 Cerebrovascular disease, unspecified: Secondary | ICD-10-CM | POA: Diagnosis not present

## 2018-06-04 DIAGNOSIS — I4891 Unspecified atrial fibrillation: Secondary | ICD-10-CM | POA: Diagnosis not present

## 2018-06-07 DIAGNOSIS — I679 Cerebrovascular disease, unspecified: Secondary | ICD-10-CM | POA: Diagnosis not present

## 2018-06-07 DIAGNOSIS — Z79899 Other long term (current) drug therapy: Secondary | ICD-10-CM | POA: Diagnosis not present

## 2018-06-07 DIAGNOSIS — I495 Sick sinus syndrome: Secondary | ICD-10-CM | POA: Diagnosis not present

## 2018-06-07 DIAGNOSIS — F0150 Vascular dementia without behavioral disturbance: Secondary | ICD-10-CM | POA: Diagnosis not present

## 2018-06-07 DIAGNOSIS — I4891 Unspecified atrial fibrillation: Secondary | ICD-10-CM | POA: Diagnosis not present

## 2018-06-07 DIAGNOSIS — I10 Essential (primary) hypertension: Secondary | ICD-10-CM | POA: Diagnosis not present

## 2018-06-07 DIAGNOSIS — I517 Cardiomegaly: Secondary | ICD-10-CM | POA: Diagnosis not present

## 2018-06-07 DIAGNOSIS — G459 Transient cerebral ischemic attack, unspecified: Secondary | ICD-10-CM | POA: Diagnosis not present

## 2018-06-14 DIAGNOSIS — I4891 Unspecified atrial fibrillation: Secondary | ICD-10-CM | POA: Diagnosis not present

## 2018-06-14 DIAGNOSIS — I679 Cerebrovascular disease, unspecified: Secondary | ICD-10-CM | POA: Diagnosis not present

## 2018-06-14 DIAGNOSIS — F0150 Vascular dementia without behavioral disturbance: Secondary | ICD-10-CM | POA: Diagnosis not present

## 2018-06-14 DIAGNOSIS — G459 Transient cerebral ischemic attack, unspecified: Secondary | ICD-10-CM | POA: Diagnosis not present

## 2018-06-14 DIAGNOSIS — I495 Sick sinus syndrome: Secondary | ICD-10-CM | POA: Diagnosis not present

## 2018-06-14 DIAGNOSIS — I10 Essential (primary) hypertension: Secondary | ICD-10-CM | POA: Diagnosis not present

## 2018-06-15 DIAGNOSIS — G459 Transient cerebral ischemic attack, unspecified: Secondary | ICD-10-CM | POA: Diagnosis not present

## 2018-06-15 DIAGNOSIS — I495 Sick sinus syndrome: Secondary | ICD-10-CM | POA: Diagnosis not present

## 2018-06-15 DIAGNOSIS — I10 Essential (primary) hypertension: Secondary | ICD-10-CM | POA: Diagnosis not present

## 2018-06-15 DIAGNOSIS — I679 Cerebrovascular disease, unspecified: Secondary | ICD-10-CM | POA: Diagnosis not present

## 2018-06-15 DIAGNOSIS — I4891 Unspecified atrial fibrillation: Secondary | ICD-10-CM | POA: Diagnosis not present

## 2018-06-15 DIAGNOSIS — F0150 Vascular dementia without behavioral disturbance: Secondary | ICD-10-CM | POA: Diagnosis not present

## 2018-06-21 ENCOUNTER — Ambulatory Visit: Payer: Medicare Other | Admitting: *Deleted

## 2018-06-21 ENCOUNTER — Other Ambulatory Visit: Payer: Self-pay

## 2018-06-21 DIAGNOSIS — G459 Transient cerebral ischemic attack, unspecified: Secondary | ICD-10-CM | POA: Diagnosis not present

## 2018-06-21 DIAGNOSIS — I679 Cerebrovascular disease, unspecified: Secondary | ICD-10-CM | POA: Diagnosis not present

## 2018-06-21 DIAGNOSIS — I495 Sick sinus syndrome: Secondary | ICD-10-CM | POA: Diagnosis not present

## 2018-06-21 DIAGNOSIS — I4891 Unspecified atrial fibrillation: Secondary | ICD-10-CM | POA: Diagnosis not present

## 2018-06-21 DIAGNOSIS — F0150 Vascular dementia without behavioral disturbance: Secondary | ICD-10-CM | POA: Diagnosis not present

## 2018-06-21 DIAGNOSIS — I10 Essential (primary) hypertension: Secondary | ICD-10-CM | POA: Diagnosis not present

## 2018-06-21 NOTE — Progress Notes (Signed)
Advice only

## 2018-06-21 NOTE — Telephone Encounter (Signed)
Confirmed remote transmission w/ pt daughter.   

## 2018-06-22 DIAGNOSIS — F0150 Vascular dementia without behavioral disturbance: Secondary | ICD-10-CM | POA: Diagnosis not present

## 2018-06-22 DIAGNOSIS — I10 Essential (primary) hypertension: Secondary | ICD-10-CM | POA: Diagnosis not present

## 2018-06-22 DIAGNOSIS — I679 Cerebrovascular disease, unspecified: Secondary | ICD-10-CM | POA: Diagnosis not present

## 2018-06-22 DIAGNOSIS — G459 Transient cerebral ischemic attack, unspecified: Secondary | ICD-10-CM | POA: Diagnosis not present

## 2018-06-22 DIAGNOSIS — I4891 Unspecified atrial fibrillation: Secondary | ICD-10-CM | POA: Diagnosis not present

## 2018-06-22 DIAGNOSIS — I495 Sick sinus syndrome: Secondary | ICD-10-CM | POA: Diagnosis not present

## 2018-06-23 ENCOUNTER — Encounter: Payer: Self-pay | Admitting: Cardiology

## 2018-06-24 DIAGNOSIS — I4891 Unspecified atrial fibrillation: Secondary | ICD-10-CM | POA: Diagnosis not present

## 2018-06-24 DIAGNOSIS — F0150 Vascular dementia without behavioral disturbance: Secondary | ICD-10-CM | POA: Diagnosis not present

## 2018-06-24 DIAGNOSIS — G459 Transient cerebral ischemic attack, unspecified: Secondary | ICD-10-CM | POA: Diagnosis not present

## 2018-06-24 DIAGNOSIS — I10 Essential (primary) hypertension: Secondary | ICD-10-CM | POA: Diagnosis not present

## 2018-06-24 DIAGNOSIS — I679 Cerebrovascular disease, unspecified: Secondary | ICD-10-CM | POA: Diagnosis not present

## 2018-06-24 DIAGNOSIS — I495 Sick sinus syndrome: Secondary | ICD-10-CM | POA: Diagnosis not present

## 2018-06-29 DIAGNOSIS — G459 Transient cerebral ischemic attack, unspecified: Secondary | ICD-10-CM | POA: Diagnosis not present

## 2018-06-29 DIAGNOSIS — F0150 Vascular dementia without behavioral disturbance: Secondary | ICD-10-CM | POA: Diagnosis not present

## 2018-06-29 DIAGNOSIS — I10 Essential (primary) hypertension: Secondary | ICD-10-CM | POA: Diagnosis not present

## 2018-06-29 DIAGNOSIS — I495 Sick sinus syndrome: Secondary | ICD-10-CM | POA: Diagnosis not present

## 2018-06-29 DIAGNOSIS — I4891 Unspecified atrial fibrillation: Secondary | ICD-10-CM | POA: Diagnosis not present

## 2018-06-29 DIAGNOSIS — I679 Cerebrovascular disease, unspecified: Secondary | ICD-10-CM | POA: Diagnosis not present

## 2018-07-01 DIAGNOSIS — K219 Gastro-esophageal reflux disease without esophagitis: Secondary | ICD-10-CM | POA: Diagnosis not present

## 2018-07-01 DIAGNOSIS — I495 Sick sinus syndrome: Secondary | ICD-10-CM | POA: Diagnosis not present

## 2018-07-01 DIAGNOSIS — I679 Cerebrovascular disease, unspecified: Secondary | ICD-10-CM | POA: Diagnosis not present

## 2018-07-01 DIAGNOSIS — E785 Hyperlipidemia, unspecified: Secondary | ICD-10-CM | POA: Diagnosis not present

## 2018-07-01 DIAGNOSIS — R1310 Dysphagia, unspecified: Secondary | ICD-10-CM | POA: Diagnosis not present

## 2018-07-01 DIAGNOSIS — M2450 Contracture, unspecified joint: Secondary | ICD-10-CM | POA: Diagnosis not present

## 2018-07-01 DIAGNOSIS — I4891 Unspecified atrial fibrillation: Secondary | ICD-10-CM | POA: Diagnosis not present

## 2018-07-01 DIAGNOSIS — F0150 Vascular dementia without behavioral disturbance: Secondary | ICD-10-CM | POA: Diagnosis not present

## 2018-07-01 DIAGNOSIS — E039 Hypothyroidism, unspecified: Secondary | ICD-10-CM | POA: Diagnosis not present

## 2018-07-01 DIAGNOSIS — G459 Transient cerebral ischemic attack, unspecified: Secondary | ICD-10-CM | POA: Diagnosis not present

## 2018-07-01 DIAGNOSIS — E46 Unspecified protein-calorie malnutrition: Secondary | ICD-10-CM | POA: Diagnosis not present

## 2018-07-01 DIAGNOSIS — I10 Essential (primary) hypertension: Secondary | ICD-10-CM | POA: Diagnosis not present

## 2018-07-01 DIAGNOSIS — Z86718 Personal history of other venous thrombosis and embolism: Secondary | ICD-10-CM | POA: Diagnosis not present

## 2018-07-01 DIAGNOSIS — R54 Age-related physical debility: Secondary | ICD-10-CM | POA: Diagnosis not present

## 2018-07-01 DIAGNOSIS — F339 Major depressive disorder, recurrent, unspecified: Secondary | ICD-10-CM | POA: Diagnosis not present

## 2018-07-03 DIAGNOSIS — G459 Transient cerebral ischemic attack, unspecified: Secondary | ICD-10-CM | POA: Diagnosis not present

## 2018-07-03 DIAGNOSIS — I10 Essential (primary) hypertension: Secondary | ICD-10-CM | POA: Diagnosis not present

## 2018-07-03 DIAGNOSIS — F0150 Vascular dementia without behavioral disturbance: Secondary | ICD-10-CM | POA: Diagnosis not present

## 2018-07-03 DIAGNOSIS — I495 Sick sinus syndrome: Secondary | ICD-10-CM | POA: Diagnosis not present

## 2018-07-03 DIAGNOSIS — I4891 Unspecified atrial fibrillation: Secondary | ICD-10-CM | POA: Diagnosis not present

## 2018-07-03 DIAGNOSIS — I679 Cerebrovascular disease, unspecified: Secondary | ICD-10-CM | POA: Diagnosis not present

## 2018-07-05 DIAGNOSIS — F0150 Vascular dementia without behavioral disturbance: Secondary | ICD-10-CM | POA: Diagnosis not present

## 2018-07-05 DIAGNOSIS — G459 Transient cerebral ischemic attack, unspecified: Secondary | ICD-10-CM | POA: Diagnosis not present

## 2018-07-05 DIAGNOSIS — I679 Cerebrovascular disease, unspecified: Secondary | ICD-10-CM | POA: Diagnosis not present

## 2018-07-05 DIAGNOSIS — I495 Sick sinus syndrome: Secondary | ICD-10-CM | POA: Diagnosis not present

## 2018-07-05 DIAGNOSIS — I4891 Unspecified atrial fibrillation: Secondary | ICD-10-CM | POA: Diagnosis not present

## 2018-07-05 DIAGNOSIS — I10 Essential (primary) hypertension: Secondary | ICD-10-CM | POA: Diagnosis not present

## 2018-07-06 DIAGNOSIS — I495 Sick sinus syndrome: Secondary | ICD-10-CM | POA: Diagnosis not present

## 2018-07-06 DIAGNOSIS — I10 Essential (primary) hypertension: Secondary | ICD-10-CM | POA: Diagnosis not present

## 2018-07-06 DIAGNOSIS — I4891 Unspecified atrial fibrillation: Secondary | ICD-10-CM | POA: Diagnosis not present

## 2018-07-06 DIAGNOSIS — I679 Cerebrovascular disease, unspecified: Secondary | ICD-10-CM | POA: Diagnosis not present

## 2018-07-06 DIAGNOSIS — F0150 Vascular dementia without behavioral disturbance: Secondary | ICD-10-CM | POA: Diagnosis not present

## 2018-07-06 DIAGNOSIS — G459 Transient cerebral ischemic attack, unspecified: Secondary | ICD-10-CM | POA: Diagnosis not present

## 2018-07-08 DIAGNOSIS — F0150 Vascular dementia without behavioral disturbance: Secondary | ICD-10-CM | POA: Diagnosis not present

## 2018-07-08 DIAGNOSIS — I679 Cerebrovascular disease, unspecified: Secondary | ICD-10-CM | POA: Diagnosis not present

## 2018-07-08 DIAGNOSIS — I10 Essential (primary) hypertension: Secondary | ICD-10-CM | POA: Diagnosis not present

## 2018-07-08 DIAGNOSIS — G459 Transient cerebral ischemic attack, unspecified: Secondary | ICD-10-CM | POA: Diagnosis not present

## 2018-07-08 DIAGNOSIS — I495 Sick sinus syndrome: Secondary | ICD-10-CM | POA: Diagnosis not present

## 2018-07-08 DIAGNOSIS — I4891 Unspecified atrial fibrillation: Secondary | ICD-10-CM | POA: Diagnosis not present

## 2018-07-12 DIAGNOSIS — I679 Cerebrovascular disease, unspecified: Secondary | ICD-10-CM | POA: Diagnosis not present

## 2018-07-12 DIAGNOSIS — F0150 Vascular dementia without behavioral disturbance: Secondary | ICD-10-CM | POA: Diagnosis not present

## 2018-07-12 DIAGNOSIS — G459 Transient cerebral ischemic attack, unspecified: Secondary | ICD-10-CM | POA: Diagnosis not present

## 2018-07-12 DIAGNOSIS — I495 Sick sinus syndrome: Secondary | ICD-10-CM | POA: Diagnosis not present

## 2018-07-12 DIAGNOSIS — I10 Essential (primary) hypertension: Secondary | ICD-10-CM | POA: Diagnosis not present

## 2018-07-12 DIAGNOSIS — I4891 Unspecified atrial fibrillation: Secondary | ICD-10-CM | POA: Diagnosis not present

## 2018-07-15 DIAGNOSIS — I10 Essential (primary) hypertension: Secondary | ICD-10-CM | POA: Diagnosis not present

## 2018-07-15 DIAGNOSIS — F0150 Vascular dementia without behavioral disturbance: Secondary | ICD-10-CM | POA: Diagnosis not present

## 2018-07-15 DIAGNOSIS — I679 Cerebrovascular disease, unspecified: Secondary | ICD-10-CM | POA: Diagnosis not present

## 2018-07-15 DIAGNOSIS — I4891 Unspecified atrial fibrillation: Secondary | ICD-10-CM | POA: Diagnosis not present

## 2018-07-15 DIAGNOSIS — I495 Sick sinus syndrome: Secondary | ICD-10-CM | POA: Diagnosis not present

## 2018-07-15 DIAGNOSIS — G459 Transient cerebral ischemic attack, unspecified: Secondary | ICD-10-CM | POA: Diagnosis not present

## 2018-07-19 DIAGNOSIS — F0150 Vascular dementia without behavioral disturbance: Secondary | ICD-10-CM | POA: Diagnosis not present

## 2018-07-19 DIAGNOSIS — I6330 Cerebral infarction due to thrombosis of unspecified cerebral artery: Secondary | ICD-10-CM | POA: Diagnosis not present

## 2018-07-19 DIAGNOSIS — G459 Transient cerebral ischemic attack, unspecified: Secondary | ICD-10-CM | POA: Diagnosis not present

## 2018-07-19 DIAGNOSIS — G894 Chronic pain syndrome: Secondary | ICD-10-CM | POA: Diagnosis not present

## 2018-07-19 DIAGNOSIS — I679 Cerebrovascular disease, unspecified: Secondary | ICD-10-CM | POA: Diagnosis not present

## 2018-07-19 DIAGNOSIS — I10 Essential (primary) hypertension: Secondary | ICD-10-CM | POA: Diagnosis not present

## 2018-07-19 DIAGNOSIS — L03818 Cellulitis of other sites: Secondary | ICD-10-CM | POA: Diagnosis not present

## 2018-07-19 DIAGNOSIS — I4891 Unspecified atrial fibrillation: Secondary | ICD-10-CM | POA: Diagnosis not present

## 2018-07-19 DIAGNOSIS — R269 Unspecified abnormalities of gait and mobility: Secondary | ICD-10-CM | POA: Diagnosis not present

## 2018-07-19 DIAGNOSIS — I495 Sick sinus syndrome: Secondary | ICD-10-CM | POA: Diagnosis not present

## 2018-07-22 DIAGNOSIS — F0150 Vascular dementia without behavioral disturbance: Secondary | ICD-10-CM | POA: Diagnosis not present

## 2018-07-22 DIAGNOSIS — I679 Cerebrovascular disease, unspecified: Secondary | ICD-10-CM | POA: Diagnosis not present

## 2018-07-22 DIAGNOSIS — I4891 Unspecified atrial fibrillation: Secondary | ICD-10-CM | POA: Diagnosis not present

## 2018-07-22 DIAGNOSIS — G459 Transient cerebral ischemic attack, unspecified: Secondary | ICD-10-CM | POA: Diagnosis not present

## 2018-07-22 DIAGNOSIS — I10 Essential (primary) hypertension: Secondary | ICD-10-CM | POA: Diagnosis not present

## 2018-07-22 DIAGNOSIS — I495 Sick sinus syndrome: Secondary | ICD-10-CM | POA: Diagnosis not present

## 2018-07-26 DIAGNOSIS — I495 Sick sinus syndrome: Secondary | ICD-10-CM | POA: Diagnosis not present

## 2018-07-26 DIAGNOSIS — I679 Cerebrovascular disease, unspecified: Secondary | ICD-10-CM | POA: Diagnosis not present

## 2018-07-26 DIAGNOSIS — I10 Essential (primary) hypertension: Secondary | ICD-10-CM | POA: Diagnosis not present

## 2018-07-26 DIAGNOSIS — I4891 Unspecified atrial fibrillation: Secondary | ICD-10-CM | POA: Diagnosis not present

## 2018-07-26 DIAGNOSIS — G459 Transient cerebral ischemic attack, unspecified: Secondary | ICD-10-CM | POA: Diagnosis not present

## 2018-07-26 DIAGNOSIS — F0150 Vascular dementia without behavioral disturbance: Secondary | ICD-10-CM | POA: Diagnosis not present

## 2018-07-30 DIAGNOSIS — F0150 Vascular dementia without behavioral disturbance: Secondary | ICD-10-CM | POA: Diagnosis not present

## 2018-07-30 DIAGNOSIS — G459 Transient cerebral ischemic attack, unspecified: Secondary | ICD-10-CM | POA: Diagnosis not present

## 2018-07-30 DIAGNOSIS — I679 Cerebrovascular disease, unspecified: Secondary | ICD-10-CM | POA: Diagnosis not present

## 2018-07-30 DIAGNOSIS — I10 Essential (primary) hypertension: Secondary | ICD-10-CM | POA: Diagnosis not present

## 2018-07-30 DIAGNOSIS — I495 Sick sinus syndrome: Secondary | ICD-10-CM | POA: Diagnosis not present

## 2018-07-30 DIAGNOSIS — I4891 Unspecified atrial fibrillation: Secondary | ICD-10-CM | POA: Diagnosis not present

## 2018-08-01 DIAGNOSIS — I679 Cerebrovascular disease, unspecified: Secondary | ICD-10-CM | POA: Diagnosis not present

## 2018-08-01 DIAGNOSIS — M2450 Contracture, unspecified joint: Secondary | ICD-10-CM | POA: Diagnosis not present

## 2018-08-01 DIAGNOSIS — I495 Sick sinus syndrome: Secondary | ICD-10-CM | POA: Diagnosis not present

## 2018-08-01 DIAGNOSIS — R1310 Dysphagia, unspecified: Secondary | ICD-10-CM | POA: Diagnosis not present

## 2018-08-01 DIAGNOSIS — G459 Transient cerebral ischemic attack, unspecified: Secondary | ICD-10-CM | POA: Diagnosis not present

## 2018-08-01 DIAGNOSIS — K219 Gastro-esophageal reflux disease without esophagitis: Secondary | ICD-10-CM | POA: Diagnosis not present

## 2018-08-01 DIAGNOSIS — E785 Hyperlipidemia, unspecified: Secondary | ICD-10-CM | POA: Diagnosis not present

## 2018-08-01 DIAGNOSIS — F0150 Vascular dementia without behavioral disturbance: Secondary | ICD-10-CM | POA: Diagnosis not present

## 2018-08-01 DIAGNOSIS — Z86718 Personal history of other venous thrombosis and embolism: Secondary | ICD-10-CM | POA: Diagnosis not present

## 2018-08-01 DIAGNOSIS — R54 Age-related physical debility: Secondary | ICD-10-CM | POA: Diagnosis not present

## 2018-08-01 DIAGNOSIS — E039 Hypothyroidism, unspecified: Secondary | ICD-10-CM | POA: Diagnosis not present

## 2018-08-01 DIAGNOSIS — F339 Major depressive disorder, recurrent, unspecified: Secondary | ICD-10-CM | POA: Diagnosis not present

## 2018-08-01 DIAGNOSIS — I10 Essential (primary) hypertension: Secondary | ICD-10-CM | POA: Diagnosis not present

## 2018-08-01 DIAGNOSIS — E46 Unspecified protein-calorie malnutrition: Secondary | ICD-10-CM | POA: Diagnosis not present

## 2018-08-01 DIAGNOSIS — I4891 Unspecified atrial fibrillation: Secondary | ICD-10-CM | POA: Diagnosis not present

## 2018-08-02 DIAGNOSIS — M79604 Pain in right leg: Secondary | ICD-10-CM | POA: Diagnosis not present

## 2018-08-02 DIAGNOSIS — I679 Cerebrovascular disease, unspecified: Secondary | ICD-10-CM | POA: Diagnosis not present

## 2018-08-02 DIAGNOSIS — Z79899 Other long term (current) drug therapy: Secondary | ICD-10-CM | POA: Diagnosis not present

## 2018-08-02 DIAGNOSIS — F418 Other specified anxiety disorders: Secondary | ICD-10-CM | POA: Diagnosis not present

## 2018-08-02 DIAGNOSIS — I4891 Unspecified atrial fibrillation: Secondary | ICD-10-CM | POA: Diagnosis not present

## 2018-08-03 DIAGNOSIS — I679 Cerebrovascular disease, unspecified: Secondary | ICD-10-CM | POA: Diagnosis not present

## 2018-08-03 DIAGNOSIS — I495 Sick sinus syndrome: Secondary | ICD-10-CM | POA: Diagnosis not present

## 2018-08-03 DIAGNOSIS — G459 Transient cerebral ischemic attack, unspecified: Secondary | ICD-10-CM | POA: Diagnosis not present

## 2018-08-03 DIAGNOSIS — I10 Essential (primary) hypertension: Secondary | ICD-10-CM | POA: Diagnosis not present

## 2018-08-03 DIAGNOSIS — F0150 Vascular dementia without behavioral disturbance: Secondary | ICD-10-CM | POA: Diagnosis not present

## 2018-08-03 DIAGNOSIS — I4891 Unspecified atrial fibrillation: Secondary | ICD-10-CM | POA: Diagnosis not present

## 2018-08-05 DIAGNOSIS — I10 Essential (primary) hypertension: Secondary | ICD-10-CM | POA: Diagnosis not present

## 2018-08-05 DIAGNOSIS — I495 Sick sinus syndrome: Secondary | ICD-10-CM | POA: Diagnosis not present

## 2018-08-05 DIAGNOSIS — G459 Transient cerebral ischemic attack, unspecified: Secondary | ICD-10-CM | POA: Diagnosis not present

## 2018-08-05 DIAGNOSIS — F0150 Vascular dementia without behavioral disturbance: Secondary | ICD-10-CM | POA: Diagnosis not present

## 2018-08-05 DIAGNOSIS — I4891 Unspecified atrial fibrillation: Secondary | ICD-10-CM | POA: Diagnosis not present

## 2018-08-05 DIAGNOSIS — I679 Cerebrovascular disease, unspecified: Secondary | ICD-10-CM | POA: Diagnosis not present

## 2018-08-06 DIAGNOSIS — I10 Essential (primary) hypertension: Secondary | ICD-10-CM | POA: Diagnosis not present

## 2018-08-06 DIAGNOSIS — G459 Transient cerebral ischemic attack, unspecified: Secondary | ICD-10-CM | POA: Diagnosis not present

## 2018-08-06 DIAGNOSIS — I679 Cerebrovascular disease, unspecified: Secondary | ICD-10-CM | POA: Diagnosis not present

## 2018-08-06 DIAGNOSIS — I4891 Unspecified atrial fibrillation: Secondary | ICD-10-CM | POA: Diagnosis not present

## 2018-08-06 DIAGNOSIS — F0150 Vascular dementia without behavioral disturbance: Secondary | ICD-10-CM | POA: Diagnosis not present

## 2018-08-06 DIAGNOSIS — I495 Sick sinus syndrome: Secondary | ICD-10-CM | POA: Diagnosis not present

## 2018-08-09 DIAGNOSIS — I4891 Unspecified atrial fibrillation: Secondary | ICD-10-CM | POA: Diagnosis not present

## 2018-08-09 DIAGNOSIS — I10 Essential (primary) hypertension: Secondary | ICD-10-CM | POA: Diagnosis not present

## 2018-08-09 DIAGNOSIS — G459 Transient cerebral ischemic attack, unspecified: Secondary | ICD-10-CM | POA: Diagnosis not present

## 2018-08-09 DIAGNOSIS — I495 Sick sinus syndrome: Secondary | ICD-10-CM | POA: Diagnosis not present

## 2018-08-09 DIAGNOSIS — I679 Cerebrovascular disease, unspecified: Secondary | ICD-10-CM | POA: Diagnosis not present

## 2018-08-09 DIAGNOSIS — F0150 Vascular dementia without behavioral disturbance: Secondary | ICD-10-CM | POA: Diagnosis not present

## 2018-08-13 DIAGNOSIS — I495 Sick sinus syndrome: Secondary | ICD-10-CM | POA: Diagnosis not present

## 2018-08-13 DIAGNOSIS — G459 Transient cerebral ischemic attack, unspecified: Secondary | ICD-10-CM | POA: Diagnosis not present

## 2018-08-13 DIAGNOSIS — F0150 Vascular dementia without behavioral disturbance: Secondary | ICD-10-CM | POA: Diagnosis not present

## 2018-08-13 DIAGNOSIS — I10 Essential (primary) hypertension: Secondary | ICD-10-CM | POA: Diagnosis not present

## 2018-08-13 DIAGNOSIS — I4891 Unspecified atrial fibrillation: Secondary | ICD-10-CM | POA: Diagnosis not present

## 2018-08-13 DIAGNOSIS — I679 Cerebrovascular disease, unspecified: Secondary | ICD-10-CM | POA: Diagnosis not present

## 2018-08-16 DIAGNOSIS — I679 Cerebrovascular disease, unspecified: Secondary | ICD-10-CM | POA: Diagnosis not present

## 2018-08-16 DIAGNOSIS — I4891 Unspecified atrial fibrillation: Secondary | ICD-10-CM | POA: Diagnosis not present

## 2018-08-16 DIAGNOSIS — I10 Essential (primary) hypertension: Secondary | ICD-10-CM | POA: Diagnosis not present

## 2018-08-16 DIAGNOSIS — G459 Transient cerebral ischemic attack, unspecified: Secondary | ICD-10-CM | POA: Diagnosis not present

## 2018-08-16 DIAGNOSIS — F0150 Vascular dementia without behavioral disturbance: Secondary | ICD-10-CM | POA: Diagnosis not present

## 2018-08-16 DIAGNOSIS — I495 Sick sinus syndrome: Secondary | ICD-10-CM | POA: Diagnosis not present

## 2018-08-19 DIAGNOSIS — I679 Cerebrovascular disease, unspecified: Secondary | ICD-10-CM | POA: Diagnosis not present

## 2018-08-19 DIAGNOSIS — I495 Sick sinus syndrome: Secondary | ICD-10-CM | POA: Diagnosis not present

## 2018-08-19 DIAGNOSIS — F0150 Vascular dementia without behavioral disturbance: Secondary | ICD-10-CM | POA: Diagnosis not present

## 2018-08-19 DIAGNOSIS — I4891 Unspecified atrial fibrillation: Secondary | ICD-10-CM | POA: Diagnosis not present

## 2018-08-19 DIAGNOSIS — G459 Transient cerebral ischemic attack, unspecified: Secondary | ICD-10-CM | POA: Diagnosis not present

## 2018-08-19 DIAGNOSIS — I10 Essential (primary) hypertension: Secondary | ICD-10-CM | POA: Diagnosis not present

## 2018-08-23 DIAGNOSIS — F0150 Vascular dementia without behavioral disturbance: Secondary | ICD-10-CM | POA: Diagnosis not present

## 2018-08-23 DIAGNOSIS — G459 Transient cerebral ischemic attack, unspecified: Secondary | ICD-10-CM | POA: Diagnosis not present

## 2018-08-23 DIAGNOSIS — I10 Essential (primary) hypertension: Secondary | ICD-10-CM | POA: Diagnosis not present

## 2018-08-23 DIAGNOSIS — I4891 Unspecified atrial fibrillation: Secondary | ICD-10-CM | POA: Diagnosis not present

## 2018-08-23 DIAGNOSIS — I495 Sick sinus syndrome: Secondary | ICD-10-CM | POA: Diagnosis not present

## 2018-08-23 DIAGNOSIS — I679 Cerebrovascular disease, unspecified: Secondary | ICD-10-CM | POA: Diagnosis not present

## 2018-08-26 DIAGNOSIS — F0150 Vascular dementia without behavioral disturbance: Secondary | ICD-10-CM | POA: Diagnosis not present

## 2018-08-26 DIAGNOSIS — I495 Sick sinus syndrome: Secondary | ICD-10-CM | POA: Diagnosis not present

## 2018-08-26 DIAGNOSIS — I679 Cerebrovascular disease, unspecified: Secondary | ICD-10-CM | POA: Diagnosis not present

## 2018-08-26 DIAGNOSIS — I4891 Unspecified atrial fibrillation: Secondary | ICD-10-CM | POA: Diagnosis not present

## 2018-08-26 DIAGNOSIS — I10 Essential (primary) hypertension: Secondary | ICD-10-CM | POA: Diagnosis not present

## 2018-08-26 DIAGNOSIS — G459 Transient cerebral ischemic attack, unspecified: Secondary | ICD-10-CM | POA: Diagnosis not present

## 2018-08-30 DIAGNOSIS — I10 Essential (primary) hypertension: Secondary | ICD-10-CM | POA: Diagnosis not present

## 2018-08-30 DIAGNOSIS — I679 Cerebrovascular disease, unspecified: Secondary | ICD-10-CM | POA: Diagnosis not present

## 2018-08-30 DIAGNOSIS — I4891 Unspecified atrial fibrillation: Secondary | ICD-10-CM | POA: Diagnosis not present

## 2018-08-30 DIAGNOSIS — G459 Transient cerebral ischemic attack, unspecified: Secondary | ICD-10-CM | POA: Diagnosis not present

## 2018-08-30 DIAGNOSIS — I495 Sick sinus syndrome: Secondary | ICD-10-CM | POA: Diagnosis not present

## 2018-08-30 DIAGNOSIS — F0150 Vascular dementia without behavioral disturbance: Secondary | ICD-10-CM | POA: Diagnosis not present

## 2018-08-31 DIAGNOSIS — F339 Major depressive disorder, recurrent, unspecified: Secondary | ICD-10-CM | POA: Diagnosis not present

## 2018-08-31 DIAGNOSIS — I679 Cerebrovascular disease, unspecified: Secondary | ICD-10-CM | POA: Diagnosis not present

## 2018-08-31 DIAGNOSIS — M2450 Contracture, unspecified joint: Secondary | ICD-10-CM | POA: Diagnosis not present

## 2018-08-31 DIAGNOSIS — E039 Hypothyroidism, unspecified: Secondary | ICD-10-CM | POA: Diagnosis not present

## 2018-08-31 DIAGNOSIS — I4891 Unspecified atrial fibrillation: Secondary | ICD-10-CM | POA: Diagnosis not present

## 2018-08-31 DIAGNOSIS — E785 Hyperlipidemia, unspecified: Secondary | ICD-10-CM | POA: Diagnosis not present

## 2018-08-31 DIAGNOSIS — K219 Gastro-esophageal reflux disease without esophagitis: Secondary | ICD-10-CM | POA: Diagnosis not present

## 2018-08-31 DIAGNOSIS — Z86718 Personal history of other venous thrombosis and embolism: Secondary | ICD-10-CM | POA: Diagnosis not present

## 2018-08-31 DIAGNOSIS — R54 Age-related physical debility: Secondary | ICD-10-CM | POA: Diagnosis not present

## 2018-08-31 DIAGNOSIS — I495 Sick sinus syndrome: Secondary | ICD-10-CM | POA: Diagnosis not present

## 2018-08-31 DIAGNOSIS — F0150 Vascular dementia without behavioral disturbance: Secondary | ICD-10-CM | POA: Diagnosis not present

## 2018-08-31 DIAGNOSIS — E46 Unspecified protein-calorie malnutrition: Secondary | ICD-10-CM | POA: Diagnosis not present

## 2018-08-31 DIAGNOSIS — I10 Essential (primary) hypertension: Secondary | ICD-10-CM | POA: Diagnosis not present

## 2018-08-31 DIAGNOSIS — G459 Transient cerebral ischemic attack, unspecified: Secondary | ICD-10-CM | POA: Diagnosis not present

## 2018-08-31 DIAGNOSIS — R1310 Dysphagia, unspecified: Secondary | ICD-10-CM | POA: Diagnosis not present

## 2018-09-08 DIAGNOSIS — I679 Cerebrovascular disease, unspecified: Secondary | ICD-10-CM | POA: Diagnosis not present

## 2018-09-08 DIAGNOSIS — G459 Transient cerebral ischemic attack, unspecified: Secondary | ICD-10-CM | POA: Diagnosis not present

## 2018-09-08 DIAGNOSIS — I495 Sick sinus syndrome: Secondary | ICD-10-CM | POA: Diagnosis not present

## 2018-09-08 DIAGNOSIS — F0150 Vascular dementia without behavioral disturbance: Secondary | ICD-10-CM | POA: Diagnosis not present

## 2018-09-08 DIAGNOSIS — I10 Essential (primary) hypertension: Secondary | ICD-10-CM | POA: Diagnosis not present

## 2018-09-08 DIAGNOSIS — I4891 Unspecified atrial fibrillation: Secondary | ICD-10-CM | POA: Diagnosis not present

## 2018-09-10 DIAGNOSIS — I4891 Unspecified atrial fibrillation: Secondary | ICD-10-CM | POA: Diagnosis not present

## 2018-09-10 DIAGNOSIS — I679 Cerebrovascular disease, unspecified: Secondary | ICD-10-CM | POA: Diagnosis not present

## 2018-09-10 DIAGNOSIS — I495 Sick sinus syndrome: Secondary | ICD-10-CM | POA: Diagnosis not present

## 2018-09-10 DIAGNOSIS — I10 Essential (primary) hypertension: Secondary | ICD-10-CM | POA: Diagnosis not present

## 2018-09-10 DIAGNOSIS — G459 Transient cerebral ischemic attack, unspecified: Secondary | ICD-10-CM | POA: Diagnosis not present

## 2018-09-10 DIAGNOSIS — F0150 Vascular dementia without behavioral disturbance: Secondary | ICD-10-CM | POA: Diagnosis not present

## 2018-09-13 DIAGNOSIS — I679 Cerebrovascular disease, unspecified: Secondary | ICD-10-CM | POA: Diagnosis not present

## 2018-09-13 DIAGNOSIS — I4891 Unspecified atrial fibrillation: Secondary | ICD-10-CM | POA: Diagnosis not present

## 2018-09-13 DIAGNOSIS — I10 Essential (primary) hypertension: Secondary | ICD-10-CM | POA: Diagnosis not present

## 2018-09-13 DIAGNOSIS — F0150 Vascular dementia without behavioral disturbance: Secondary | ICD-10-CM | POA: Diagnosis not present

## 2018-09-13 DIAGNOSIS — I495 Sick sinus syndrome: Secondary | ICD-10-CM | POA: Diagnosis not present

## 2018-09-13 DIAGNOSIS — G459 Transient cerebral ischemic attack, unspecified: Secondary | ICD-10-CM | POA: Diagnosis not present

## 2018-09-15 DIAGNOSIS — I679 Cerebrovascular disease, unspecified: Secondary | ICD-10-CM | POA: Diagnosis not present

## 2018-09-15 DIAGNOSIS — I10 Essential (primary) hypertension: Secondary | ICD-10-CM | POA: Diagnosis not present

## 2018-09-15 DIAGNOSIS — G459 Transient cerebral ischemic attack, unspecified: Secondary | ICD-10-CM | POA: Diagnosis not present

## 2018-09-15 DIAGNOSIS — I495 Sick sinus syndrome: Secondary | ICD-10-CM | POA: Diagnosis not present

## 2018-09-15 DIAGNOSIS — I4891 Unspecified atrial fibrillation: Secondary | ICD-10-CM | POA: Diagnosis not present

## 2018-09-15 DIAGNOSIS — F0150 Vascular dementia without behavioral disturbance: Secondary | ICD-10-CM | POA: Diagnosis not present

## 2018-09-17 DIAGNOSIS — G459 Transient cerebral ischemic attack, unspecified: Secondary | ICD-10-CM | POA: Diagnosis not present

## 2018-09-17 DIAGNOSIS — I679 Cerebrovascular disease, unspecified: Secondary | ICD-10-CM | POA: Diagnosis not present

## 2018-09-17 DIAGNOSIS — I495 Sick sinus syndrome: Secondary | ICD-10-CM | POA: Diagnosis not present

## 2018-09-17 DIAGNOSIS — I4891 Unspecified atrial fibrillation: Secondary | ICD-10-CM | POA: Diagnosis not present

## 2018-09-17 DIAGNOSIS — I10 Essential (primary) hypertension: Secondary | ICD-10-CM | POA: Diagnosis not present

## 2018-09-17 DIAGNOSIS — F0150 Vascular dementia without behavioral disturbance: Secondary | ICD-10-CM | POA: Diagnosis not present

## 2018-09-20 DIAGNOSIS — M159 Polyosteoarthritis, unspecified: Secondary | ICD-10-CM | POA: Diagnosis not present

## 2018-09-20 DIAGNOSIS — F0150 Vascular dementia without behavioral disturbance: Secondary | ICD-10-CM | POA: Diagnosis not present

## 2018-09-20 DIAGNOSIS — I679 Cerebrovascular disease, unspecified: Secondary | ICD-10-CM | POA: Diagnosis not present

## 2018-09-20 DIAGNOSIS — I4891 Unspecified atrial fibrillation: Secondary | ICD-10-CM | POA: Diagnosis not present

## 2018-09-20 DIAGNOSIS — I10 Essential (primary) hypertension: Secondary | ICD-10-CM | POA: Diagnosis not present

## 2018-09-20 DIAGNOSIS — I495 Sick sinus syndrome: Secondary | ICD-10-CM | POA: Diagnosis not present

## 2018-09-20 DIAGNOSIS — G459 Transient cerebral ischemic attack, unspecified: Secondary | ICD-10-CM | POA: Diagnosis not present

## 2018-09-22 DIAGNOSIS — G459 Transient cerebral ischemic attack, unspecified: Secondary | ICD-10-CM | POA: Diagnosis not present

## 2018-09-22 DIAGNOSIS — I495 Sick sinus syndrome: Secondary | ICD-10-CM | POA: Diagnosis not present

## 2018-09-22 DIAGNOSIS — I679 Cerebrovascular disease, unspecified: Secondary | ICD-10-CM | POA: Diagnosis not present

## 2018-09-22 DIAGNOSIS — I4891 Unspecified atrial fibrillation: Secondary | ICD-10-CM | POA: Diagnosis not present

## 2018-09-22 DIAGNOSIS — F0150 Vascular dementia without behavioral disturbance: Secondary | ICD-10-CM | POA: Diagnosis not present

## 2018-09-22 DIAGNOSIS — I10 Essential (primary) hypertension: Secondary | ICD-10-CM | POA: Diagnosis not present

## 2018-09-24 DIAGNOSIS — I495 Sick sinus syndrome: Secondary | ICD-10-CM | POA: Diagnosis not present

## 2018-09-24 DIAGNOSIS — I4891 Unspecified atrial fibrillation: Secondary | ICD-10-CM | POA: Diagnosis not present

## 2018-09-24 DIAGNOSIS — I10 Essential (primary) hypertension: Secondary | ICD-10-CM | POA: Diagnosis not present

## 2018-09-24 DIAGNOSIS — F0150 Vascular dementia without behavioral disturbance: Secondary | ICD-10-CM | POA: Diagnosis not present

## 2018-09-24 DIAGNOSIS — I679 Cerebrovascular disease, unspecified: Secondary | ICD-10-CM | POA: Diagnosis not present

## 2018-09-24 DIAGNOSIS — G459 Transient cerebral ischemic attack, unspecified: Secondary | ICD-10-CM | POA: Diagnosis not present

## 2018-09-27 DIAGNOSIS — F0150 Vascular dementia without behavioral disturbance: Secondary | ICD-10-CM | POA: Diagnosis not present

## 2018-09-27 DIAGNOSIS — G459 Transient cerebral ischemic attack, unspecified: Secondary | ICD-10-CM | POA: Diagnosis not present

## 2018-09-27 DIAGNOSIS — I10 Essential (primary) hypertension: Secondary | ICD-10-CM | POA: Diagnosis not present

## 2018-09-27 DIAGNOSIS — I4891 Unspecified atrial fibrillation: Secondary | ICD-10-CM | POA: Diagnosis not present

## 2018-09-27 DIAGNOSIS — I495 Sick sinus syndrome: Secondary | ICD-10-CM | POA: Diagnosis not present

## 2018-09-27 DIAGNOSIS — I679 Cerebrovascular disease, unspecified: Secondary | ICD-10-CM | POA: Diagnosis not present

## 2018-09-29 DIAGNOSIS — I4891 Unspecified atrial fibrillation: Secondary | ICD-10-CM | POA: Diagnosis not present

## 2018-09-29 DIAGNOSIS — I679 Cerebrovascular disease, unspecified: Secondary | ICD-10-CM | POA: Diagnosis not present

## 2018-09-29 DIAGNOSIS — G459 Transient cerebral ischemic attack, unspecified: Secondary | ICD-10-CM | POA: Diagnosis not present

## 2018-09-29 DIAGNOSIS — F0150 Vascular dementia without behavioral disturbance: Secondary | ICD-10-CM | POA: Diagnosis not present

## 2018-09-29 DIAGNOSIS — I10 Essential (primary) hypertension: Secondary | ICD-10-CM | POA: Diagnosis not present

## 2018-09-29 DIAGNOSIS — I495 Sick sinus syndrome: Secondary | ICD-10-CM | POA: Diagnosis not present

## 2018-09-30 ENCOUNTER — Telehealth: Payer: Self-pay

## 2018-09-30 ENCOUNTER — Encounter: Payer: Medicare Other | Admitting: *Deleted

## 2018-09-30 NOTE — Telephone Encounter (Signed)
LMOVM reminding pt to send remote transmission.   

## 2018-10-01 ENCOUNTER — Encounter: Payer: Self-pay | Admitting: Cardiology

## 2018-10-01 DIAGNOSIS — Z86718 Personal history of other venous thrombosis and embolism: Secondary | ICD-10-CM | POA: Diagnosis not present

## 2018-10-01 DIAGNOSIS — M2450 Contracture, unspecified joint: Secondary | ICD-10-CM | POA: Diagnosis not present

## 2018-10-01 DIAGNOSIS — E039 Hypothyroidism, unspecified: Secondary | ICD-10-CM | POA: Diagnosis not present

## 2018-10-01 DIAGNOSIS — R1310 Dysphagia, unspecified: Secondary | ICD-10-CM | POA: Diagnosis not present

## 2018-10-01 DIAGNOSIS — R54 Age-related physical debility: Secondary | ICD-10-CM | POA: Diagnosis not present

## 2018-10-01 DIAGNOSIS — G459 Transient cerebral ischemic attack, unspecified: Secondary | ICD-10-CM | POA: Diagnosis not present

## 2018-10-01 DIAGNOSIS — F0150 Vascular dementia without behavioral disturbance: Secondary | ICD-10-CM | POA: Diagnosis not present

## 2018-10-01 DIAGNOSIS — I10 Essential (primary) hypertension: Secondary | ICD-10-CM | POA: Diagnosis not present

## 2018-10-01 DIAGNOSIS — I495 Sick sinus syndrome: Secondary | ICD-10-CM | POA: Diagnosis not present

## 2018-10-01 DIAGNOSIS — I4891 Unspecified atrial fibrillation: Secondary | ICD-10-CM | POA: Diagnosis not present

## 2018-10-01 DIAGNOSIS — I679 Cerebrovascular disease, unspecified: Secondary | ICD-10-CM | POA: Diagnosis not present

## 2018-10-01 DIAGNOSIS — K219 Gastro-esophageal reflux disease without esophagitis: Secondary | ICD-10-CM | POA: Diagnosis not present

## 2018-10-01 DIAGNOSIS — F339 Major depressive disorder, recurrent, unspecified: Secondary | ICD-10-CM | POA: Diagnosis not present

## 2018-10-01 DIAGNOSIS — E785 Hyperlipidemia, unspecified: Secondary | ICD-10-CM | POA: Diagnosis not present

## 2018-10-01 DIAGNOSIS — E46 Unspecified protein-calorie malnutrition: Secondary | ICD-10-CM | POA: Diagnosis not present

## 2018-10-04 DIAGNOSIS — G459 Transient cerebral ischemic attack, unspecified: Secondary | ICD-10-CM | POA: Diagnosis not present

## 2018-10-04 DIAGNOSIS — F0150 Vascular dementia without behavioral disturbance: Secondary | ICD-10-CM | POA: Diagnosis not present

## 2018-10-04 DIAGNOSIS — I4891 Unspecified atrial fibrillation: Secondary | ICD-10-CM | POA: Diagnosis not present

## 2018-10-04 DIAGNOSIS — I10 Essential (primary) hypertension: Secondary | ICD-10-CM | POA: Diagnosis not present

## 2018-10-04 DIAGNOSIS — I495 Sick sinus syndrome: Secondary | ICD-10-CM | POA: Diagnosis not present

## 2018-10-04 DIAGNOSIS — I679 Cerebrovascular disease, unspecified: Secondary | ICD-10-CM | POA: Diagnosis not present

## 2018-10-11 DIAGNOSIS — I10 Essential (primary) hypertension: Secondary | ICD-10-CM | POA: Diagnosis not present

## 2018-10-11 DIAGNOSIS — I679 Cerebrovascular disease, unspecified: Secondary | ICD-10-CM | POA: Diagnosis not present

## 2018-10-11 DIAGNOSIS — I495 Sick sinus syndrome: Secondary | ICD-10-CM | POA: Diagnosis not present

## 2018-10-11 DIAGNOSIS — I4891 Unspecified atrial fibrillation: Secondary | ICD-10-CM | POA: Diagnosis not present

## 2018-10-11 DIAGNOSIS — G459 Transient cerebral ischemic attack, unspecified: Secondary | ICD-10-CM | POA: Diagnosis not present

## 2018-10-11 DIAGNOSIS — F0150 Vascular dementia without behavioral disturbance: Secondary | ICD-10-CM | POA: Diagnosis not present

## 2018-10-13 DIAGNOSIS — I679 Cerebrovascular disease, unspecified: Secondary | ICD-10-CM | POA: Diagnosis not present

## 2018-10-13 DIAGNOSIS — I495 Sick sinus syndrome: Secondary | ICD-10-CM | POA: Diagnosis not present

## 2018-10-13 DIAGNOSIS — F0150 Vascular dementia without behavioral disturbance: Secondary | ICD-10-CM | POA: Diagnosis not present

## 2018-10-13 DIAGNOSIS — G459 Transient cerebral ischemic attack, unspecified: Secondary | ICD-10-CM | POA: Diagnosis not present

## 2018-10-13 DIAGNOSIS — I4891 Unspecified atrial fibrillation: Secondary | ICD-10-CM | POA: Diagnosis not present

## 2018-10-13 DIAGNOSIS — I10 Essential (primary) hypertension: Secondary | ICD-10-CM | POA: Diagnosis not present

## 2018-10-18 DIAGNOSIS — G459 Transient cerebral ischemic attack, unspecified: Secondary | ICD-10-CM | POA: Diagnosis not present

## 2018-10-18 DIAGNOSIS — F0150 Vascular dementia without behavioral disturbance: Secondary | ICD-10-CM | POA: Diagnosis not present

## 2018-10-18 DIAGNOSIS — G894 Chronic pain syndrome: Secondary | ICD-10-CM | POA: Diagnosis not present

## 2018-10-18 DIAGNOSIS — I679 Cerebrovascular disease, unspecified: Secondary | ICD-10-CM | POA: Diagnosis not present

## 2018-10-18 DIAGNOSIS — I495 Sick sinus syndrome: Secondary | ICD-10-CM | POA: Diagnosis not present

## 2018-10-18 DIAGNOSIS — F418 Other specified anxiety disorders: Secondary | ICD-10-CM | POA: Diagnosis not present

## 2018-10-18 DIAGNOSIS — I10 Essential (primary) hypertension: Secondary | ICD-10-CM | POA: Diagnosis not present

## 2018-10-18 DIAGNOSIS — I4891 Unspecified atrial fibrillation: Secondary | ICD-10-CM | POA: Diagnosis not present

## 2018-10-20 DIAGNOSIS — G459 Transient cerebral ischemic attack, unspecified: Secondary | ICD-10-CM | POA: Diagnosis not present

## 2018-10-20 DIAGNOSIS — I495 Sick sinus syndrome: Secondary | ICD-10-CM | POA: Diagnosis not present

## 2018-10-20 DIAGNOSIS — I679 Cerebrovascular disease, unspecified: Secondary | ICD-10-CM | POA: Diagnosis not present

## 2018-10-20 DIAGNOSIS — I4891 Unspecified atrial fibrillation: Secondary | ICD-10-CM | POA: Diagnosis not present

## 2018-10-20 DIAGNOSIS — F0150 Vascular dementia without behavioral disturbance: Secondary | ICD-10-CM | POA: Diagnosis not present

## 2018-10-20 DIAGNOSIS — I10 Essential (primary) hypertension: Secondary | ICD-10-CM | POA: Diagnosis not present

## 2018-10-22 DIAGNOSIS — I495 Sick sinus syndrome: Secondary | ICD-10-CM | POA: Diagnosis not present

## 2018-10-22 DIAGNOSIS — F0150 Vascular dementia without behavioral disturbance: Secondary | ICD-10-CM | POA: Diagnosis not present

## 2018-10-22 DIAGNOSIS — G459 Transient cerebral ischemic attack, unspecified: Secondary | ICD-10-CM | POA: Diagnosis not present

## 2018-10-22 DIAGNOSIS — I10 Essential (primary) hypertension: Secondary | ICD-10-CM | POA: Diagnosis not present

## 2018-10-22 DIAGNOSIS — I679 Cerebrovascular disease, unspecified: Secondary | ICD-10-CM | POA: Diagnosis not present

## 2018-10-22 DIAGNOSIS — I4891 Unspecified atrial fibrillation: Secondary | ICD-10-CM | POA: Diagnosis not present

## 2018-10-25 DIAGNOSIS — I679 Cerebrovascular disease, unspecified: Secondary | ICD-10-CM | POA: Diagnosis not present

## 2018-10-25 DIAGNOSIS — I4891 Unspecified atrial fibrillation: Secondary | ICD-10-CM | POA: Diagnosis not present

## 2018-10-25 DIAGNOSIS — I495 Sick sinus syndrome: Secondary | ICD-10-CM | POA: Diagnosis not present

## 2018-10-25 DIAGNOSIS — I10 Essential (primary) hypertension: Secondary | ICD-10-CM | POA: Diagnosis not present

## 2018-10-25 DIAGNOSIS — G459 Transient cerebral ischemic attack, unspecified: Secondary | ICD-10-CM | POA: Diagnosis not present

## 2018-10-25 DIAGNOSIS — F0150 Vascular dementia without behavioral disturbance: Secondary | ICD-10-CM | POA: Diagnosis not present

## 2018-10-29 DIAGNOSIS — I4891 Unspecified atrial fibrillation: Secondary | ICD-10-CM | POA: Diagnosis not present

## 2018-10-29 DIAGNOSIS — F0150 Vascular dementia without behavioral disturbance: Secondary | ICD-10-CM | POA: Diagnosis not present

## 2018-10-29 DIAGNOSIS — I10 Essential (primary) hypertension: Secondary | ICD-10-CM | POA: Diagnosis not present

## 2018-10-29 DIAGNOSIS — I679 Cerebrovascular disease, unspecified: Secondary | ICD-10-CM | POA: Diagnosis not present

## 2018-10-29 DIAGNOSIS — G459 Transient cerebral ischemic attack, unspecified: Secondary | ICD-10-CM | POA: Diagnosis not present

## 2018-10-29 DIAGNOSIS — I495 Sick sinus syndrome: Secondary | ICD-10-CM | POA: Diagnosis not present

## 2018-10-31 DIAGNOSIS — E46 Unspecified protein-calorie malnutrition: Secondary | ICD-10-CM | POA: Diagnosis not present

## 2018-10-31 DIAGNOSIS — F0150 Vascular dementia without behavioral disturbance: Secondary | ICD-10-CM | POA: Diagnosis not present

## 2018-10-31 DIAGNOSIS — G459 Transient cerebral ischemic attack, unspecified: Secondary | ICD-10-CM | POA: Diagnosis not present

## 2018-10-31 DIAGNOSIS — K219 Gastro-esophageal reflux disease without esophagitis: Secondary | ICD-10-CM | POA: Diagnosis not present

## 2018-10-31 DIAGNOSIS — I10 Essential (primary) hypertension: Secondary | ICD-10-CM | POA: Diagnosis not present

## 2018-10-31 DIAGNOSIS — M2450 Contracture, unspecified joint: Secondary | ICD-10-CM | POA: Diagnosis not present

## 2018-10-31 DIAGNOSIS — R1310 Dysphagia, unspecified: Secondary | ICD-10-CM | POA: Diagnosis not present

## 2018-10-31 DIAGNOSIS — I679 Cerebrovascular disease, unspecified: Secondary | ICD-10-CM | POA: Diagnosis not present

## 2018-10-31 DIAGNOSIS — F339 Major depressive disorder, recurrent, unspecified: Secondary | ICD-10-CM | POA: Diagnosis not present

## 2018-10-31 DIAGNOSIS — R54 Age-related physical debility: Secondary | ICD-10-CM | POA: Diagnosis not present

## 2018-10-31 DIAGNOSIS — E785 Hyperlipidemia, unspecified: Secondary | ICD-10-CM | POA: Diagnosis not present

## 2018-10-31 DIAGNOSIS — E039 Hypothyroidism, unspecified: Secondary | ICD-10-CM | POA: Diagnosis not present

## 2018-10-31 DIAGNOSIS — I495 Sick sinus syndrome: Secondary | ICD-10-CM | POA: Diagnosis not present

## 2018-10-31 DIAGNOSIS — Z86718 Personal history of other venous thrombosis and embolism: Secondary | ICD-10-CM | POA: Diagnosis not present

## 2018-10-31 DIAGNOSIS — I4891 Unspecified atrial fibrillation: Secondary | ICD-10-CM | POA: Diagnosis not present

## 2018-11-01 DIAGNOSIS — I679 Cerebrovascular disease, unspecified: Secondary | ICD-10-CM | POA: Diagnosis not present

## 2018-11-01 DIAGNOSIS — I4891 Unspecified atrial fibrillation: Secondary | ICD-10-CM | POA: Diagnosis not present

## 2018-11-01 DIAGNOSIS — I10 Essential (primary) hypertension: Secondary | ICD-10-CM | POA: Diagnosis not present

## 2018-11-01 DIAGNOSIS — F0150 Vascular dementia without behavioral disturbance: Secondary | ICD-10-CM | POA: Diagnosis not present

## 2018-11-01 DIAGNOSIS — I495 Sick sinus syndrome: Secondary | ICD-10-CM | POA: Diagnosis not present

## 2018-11-01 DIAGNOSIS — G459 Transient cerebral ischemic attack, unspecified: Secondary | ICD-10-CM | POA: Diagnosis not present

## 2018-11-05 DIAGNOSIS — I4891 Unspecified atrial fibrillation: Secondary | ICD-10-CM | POA: Diagnosis not present

## 2018-11-05 DIAGNOSIS — G459 Transient cerebral ischemic attack, unspecified: Secondary | ICD-10-CM | POA: Diagnosis not present

## 2018-11-05 DIAGNOSIS — F0150 Vascular dementia without behavioral disturbance: Secondary | ICD-10-CM | POA: Diagnosis not present

## 2018-11-05 DIAGNOSIS — I679 Cerebrovascular disease, unspecified: Secondary | ICD-10-CM | POA: Diagnosis not present

## 2018-11-05 DIAGNOSIS — I10 Essential (primary) hypertension: Secondary | ICD-10-CM | POA: Diagnosis not present

## 2018-11-05 DIAGNOSIS — I495 Sick sinus syndrome: Secondary | ICD-10-CM | POA: Diagnosis not present

## 2018-11-09 DIAGNOSIS — I10 Essential (primary) hypertension: Secondary | ICD-10-CM | POA: Diagnosis not present

## 2018-11-09 DIAGNOSIS — I679 Cerebrovascular disease, unspecified: Secondary | ICD-10-CM | POA: Diagnosis not present

## 2018-11-09 DIAGNOSIS — F0150 Vascular dementia without behavioral disturbance: Secondary | ICD-10-CM | POA: Diagnosis not present

## 2018-11-09 DIAGNOSIS — I495 Sick sinus syndrome: Secondary | ICD-10-CM | POA: Diagnosis not present

## 2018-11-09 DIAGNOSIS — G459 Transient cerebral ischemic attack, unspecified: Secondary | ICD-10-CM | POA: Diagnosis not present

## 2018-11-09 DIAGNOSIS — I4891 Unspecified atrial fibrillation: Secondary | ICD-10-CM | POA: Diagnosis not present

## 2018-11-12 DIAGNOSIS — I4891 Unspecified atrial fibrillation: Secondary | ICD-10-CM | POA: Diagnosis not present

## 2018-11-12 DIAGNOSIS — I10 Essential (primary) hypertension: Secondary | ICD-10-CM | POA: Diagnosis not present

## 2018-11-12 DIAGNOSIS — F0150 Vascular dementia without behavioral disturbance: Secondary | ICD-10-CM | POA: Diagnosis not present

## 2018-11-12 DIAGNOSIS — I679 Cerebrovascular disease, unspecified: Secondary | ICD-10-CM | POA: Diagnosis not present

## 2018-11-12 DIAGNOSIS — G459 Transient cerebral ischemic attack, unspecified: Secondary | ICD-10-CM | POA: Diagnosis not present

## 2018-11-12 DIAGNOSIS — I495 Sick sinus syndrome: Secondary | ICD-10-CM | POA: Diagnosis not present

## 2018-11-15 DIAGNOSIS — I4891 Unspecified atrial fibrillation: Secondary | ICD-10-CM | POA: Diagnosis not present

## 2018-11-15 DIAGNOSIS — G459 Transient cerebral ischemic attack, unspecified: Secondary | ICD-10-CM | POA: Diagnosis not present

## 2018-11-15 DIAGNOSIS — F0150 Vascular dementia without behavioral disturbance: Secondary | ICD-10-CM | POA: Diagnosis not present

## 2018-11-15 DIAGNOSIS — I10 Essential (primary) hypertension: Secondary | ICD-10-CM | POA: Diagnosis not present

## 2018-11-15 DIAGNOSIS — I679 Cerebrovascular disease, unspecified: Secondary | ICD-10-CM | POA: Diagnosis not present

## 2018-11-15 DIAGNOSIS — F419 Anxiety disorder, unspecified: Secondary | ICD-10-CM | POA: Diagnosis not present

## 2018-11-15 DIAGNOSIS — L03818 Cellulitis of other sites: Secondary | ICD-10-CM | POA: Diagnosis not present

## 2018-11-15 DIAGNOSIS — I495 Sick sinus syndrome: Secondary | ICD-10-CM | POA: Diagnosis not present

## 2018-11-17 DIAGNOSIS — I495 Sick sinus syndrome: Secondary | ICD-10-CM | POA: Diagnosis not present

## 2018-11-17 DIAGNOSIS — G459 Transient cerebral ischemic attack, unspecified: Secondary | ICD-10-CM | POA: Diagnosis not present

## 2018-11-17 DIAGNOSIS — F0150 Vascular dementia without behavioral disturbance: Secondary | ICD-10-CM | POA: Diagnosis not present

## 2018-11-17 DIAGNOSIS — I4891 Unspecified atrial fibrillation: Secondary | ICD-10-CM | POA: Diagnosis not present

## 2018-11-17 DIAGNOSIS — I679 Cerebrovascular disease, unspecified: Secondary | ICD-10-CM | POA: Diagnosis not present

## 2018-11-17 DIAGNOSIS — I10 Essential (primary) hypertension: Secondary | ICD-10-CM | POA: Diagnosis not present

## 2018-11-19 DIAGNOSIS — F0150 Vascular dementia without behavioral disturbance: Secondary | ICD-10-CM | POA: Diagnosis not present

## 2018-11-19 DIAGNOSIS — I495 Sick sinus syndrome: Secondary | ICD-10-CM | POA: Diagnosis not present

## 2018-11-19 DIAGNOSIS — G459 Transient cerebral ischemic attack, unspecified: Secondary | ICD-10-CM | POA: Diagnosis not present

## 2018-11-19 DIAGNOSIS — I10 Essential (primary) hypertension: Secondary | ICD-10-CM | POA: Diagnosis not present

## 2018-11-19 DIAGNOSIS — I679 Cerebrovascular disease, unspecified: Secondary | ICD-10-CM | POA: Diagnosis not present

## 2018-11-19 DIAGNOSIS — I4891 Unspecified atrial fibrillation: Secondary | ICD-10-CM | POA: Diagnosis not present

## 2018-11-22 DIAGNOSIS — G459 Transient cerebral ischemic attack, unspecified: Secondary | ICD-10-CM | POA: Diagnosis not present

## 2018-11-22 DIAGNOSIS — I4891 Unspecified atrial fibrillation: Secondary | ICD-10-CM | POA: Diagnosis not present

## 2018-11-22 DIAGNOSIS — I10 Essential (primary) hypertension: Secondary | ICD-10-CM | POA: Diagnosis not present

## 2018-11-22 DIAGNOSIS — F0150 Vascular dementia without behavioral disturbance: Secondary | ICD-10-CM | POA: Diagnosis not present

## 2018-11-22 DIAGNOSIS — I495 Sick sinus syndrome: Secondary | ICD-10-CM | POA: Diagnosis not present

## 2018-11-22 DIAGNOSIS — I679 Cerebrovascular disease, unspecified: Secondary | ICD-10-CM | POA: Diagnosis not present

## 2018-11-25 DIAGNOSIS — G459 Transient cerebral ischemic attack, unspecified: Secondary | ICD-10-CM | POA: Diagnosis not present

## 2018-11-25 DIAGNOSIS — F0150 Vascular dementia without behavioral disturbance: Secondary | ICD-10-CM | POA: Diagnosis not present

## 2018-11-25 DIAGNOSIS — I679 Cerebrovascular disease, unspecified: Secondary | ICD-10-CM | POA: Diagnosis not present

## 2018-11-25 DIAGNOSIS — I4891 Unspecified atrial fibrillation: Secondary | ICD-10-CM | POA: Diagnosis not present

## 2018-11-25 DIAGNOSIS — I10 Essential (primary) hypertension: Secondary | ICD-10-CM | POA: Diagnosis not present

## 2018-11-25 DIAGNOSIS — I495 Sick sinus syndrome: Secondary | ICD-10-CM | POA: Diagnosis not present

## 2018-11-26 DIAGNOSIS — I4891 Unspecified atrial fibrillation: Secondary | ICD-10-CM | POA: Diagnosis not present

## 2018-11-26 DIAGNOSIS — I495 Sick sinus syndrome: Secondary | ICD-10-CM | POA: Diagnosis not present

## 2018-11-26 DIAGNOSIS — I10 Essential (primary) hypertension: Secondary | ICD-10-CM | POA: Diagnosis not present

## 2018-11-26 DIAGNOSIS — I679 Cerebrovascular disease, unspecified: Secondary | ICD-10-CM | POA: Diagnosis not present

## 2018-11-26 DIAGNOSIS — F0150 Vascular dementia without behavioral disturbance: Secondary | ICD-10-CM | POA: Diagnosis not present

## 2018-11-26 DIAGNOSIS — G459 Transient cerebral ischemic attack, unspecified: Secondary | ICD-10-CM | POA: Diagnosis not present

## 2018-11-29 DIAGNOSIS — L03818 Cellulitis of other sites: Secondary | ICD-10-CM | POA: Diagnosis not present

## 2018-11-29 DIAGNOSIS — G459 Transient cerebral ischemic attack, unspecified: Secondary | ICD-10-CM | POA: Diagnosis not present

## 2018-11-29 DIAGNOSIS — I4891 Unspecified atrial fibrillation: Secondary | ICD-10-CM | POA: Diagnosis not present

## 2018-11-29 DIAGNOSIS — M79604 Pain in right leg: Secondary | ICD-10-CM | POA: Diagnosis not present

## 2018-11-29 DIAGNOSIS — I10 Essential (primary) hypertension: Secondary | ICD-10-CM | POA: Diagnosis not present

## 2018-11-29 DIAGNOSIS — F0150 Vascular dementia without behavioral disturbance: Secondary | ICD-10-CM | POA: Diagnosis not present

## 2018-11-29 DIAGNOSIS — I495 Sick sinus syndrome: Secondary | ICD-10-CM | POA: Diagnosis not present

## 2018-11-29 DIAGNOSIS — I679 Cerebrovascular disease, unspecified: Secondary | ICD-10-CM | POA: Diagnosis not present

## 2018-12-01 DIAGNOSIS — K219 Gastro-esophageal reflux disease without esophagitis: Secondary | ICD-10-CM | POA: Diagnosis not present

## 2018-12-01 DIAGNOSIS — I679 Cerebrovascular disease, unspecified: Secondary | ICD-10-CM | POA: Diagnosis not present

## 2018-12-01 DIAGNOSIS — F339 Major depressive disorder, recurrent, unspecified: Secondary | ICD-10-CM | POA: Diagnosis not present

## 2018-12-01 DIAGNOSIS — G459 Transient cerebral ischemic attack, unspecified: Secondary | ICD-10-CM | POA: Diagnosis not present

## 2018-12-01 DIAGNOSIS — I495 Sick sinus syndrome: Secondary | ICD-10-CM | POA: Diagnosis not present

## 2018-12-01 DIAGNOSIS — F0150 Vascular dementia without behavioral disturbance: Secondary | ICD-10-CM | POA: Diagnosis not present

## 2018-12-01 DIAGNOSIS — R54 Age-related physical debility: Secondary | ICD-10-CM | POA: Diagnosis not present

## 2018-12-01 DIAGNOSIS — I10 Essential (primary) hypertension: Secondary | ICD-10-CM | POA: Diagnosis not present

## 2018-12-01 DIAGNOSIS — M2450 Contracture, unspecified joint: Secondary | ICD-10-CM | POA: Diagnosis not present

## 2018-12-01 DIAGNOSIS — R1310 Dysphagia, unspecified: Secondary | ICD-10-CM | POA: Diagnosis not present

## 2018-12-01 DIAGNOSIS — E46 Unspecified protein-calorie malnutrition: Secondary | ICD-10-CM | POA: Diagnosis not present

## 2018-12-01 DIAGNOSIS — I4891 Unspecified atrial fibrillation: Secondary | ICD-10-CM | POA: Diagnosis not present

## 2018-12-01 DIAGNOSIS — Z86718 Personal history of other venous thrombosis and embolism: Secondary | ICD-10-CM | POA: Diagnosis not present

## 2018-12-01 DIAGNOSIS — E039 Hypothyroidism, unspecified: Secondary | ICD-10-CM | POA: Diagnosis not present

## 2018-12-01 DIAGNOSIS — E785 Hyperlipidemia, unspecified: Secondary | ICD-10-CM | POA: Diagnosis not present

## 2018-12-02 DIAGNOSIS — I10 Essential (primary) hypertension: Secondary | ICD-10-CM | POA: Diagnosis not present

## 2018-12-02 DIAGNOSIS — I495 Sick sinus syndrome: Secondary | ICD-10-CM | POA: Diagnosis not present

## 2018-12-02 DIAGNOSIS — F0150 Vascular dementia without behavioral disturbance: Secondary | ICD-10-CM | POA: Diagnosis not present

## 2018-12-02 DIAGNOSIS — G459 Transient cerebral ischemic attack, unspecified: Secondary | ICD-10-CM | POA: Diagnosis not present

## 2018-12-02 DIAGNOSIS — I4891 Unspecified atrial fibrillation: Secondary | ICD-10-CM | POA: Diagnosis not present

## 2018-12-02 DIAGNOSIS — I679 Cerebrovascular disease, unspecified: Secondary | ICD-10-CM | POA: Diagnosis not present

## 2018-12-03 DIAGNOSIS — I4891 Unspecified atrial fibrillation: Secondary | ICD-10-CM | POA: Diagnosis not present

## 2018-12-03 DIAGNOSIS — I495 Sick sinus syndrome: Secondary | ICD-10-CM | POA: Diagnosis not present

## 2018-12-03 DIAGNOSIS — I10 Essential (primary) hypertension: Secondary | ICD-10-CM | POA: Diagnosis not present

## 2018-12-03 DIAGNOSIS — I679 Cerebrovascular disease, unspecified: Secondary | ICD-10-CM | POA: Diagnosis not present

## 2018-12-03 DIAGNOSIS — F0150 Vascular dementia without behavioral disturbance: Secondary | ICD-10-CM | POA: Diagnosis not present

## 2018-12-03 DIAGNOSIS — G459 Transient cerebral ischemic attack, unspecified: Secondary | ICD-10-CM | POA: Diagnosis not present

## 2018-12-06 DIAGNOSIS — G459 Transient cerebral ischemic attack, unspecified: Secondary | ICD-10-CM | POA: Diagnosis not present

## 2018-12-06 DIAGNOSIS — I10 Essential (primary) hypertension: Secondary | ICD-10-CM | POA: Diagnosis not present

## 2018-12-06 DIAGNOSIS — I679 Cerebrovascular disease, unspecified: Secondary | ICD-10-CM | POA: Diagnosis not present

## 2018-12-06 DIAGNOSIS — F0150 Vascular dementia without behavioral disturbance: Secondary | ICD-10-CM | POA: Diagnosis not present

## 2018-12-06 DIAGNOSIS — I495 Sick sinus syndrome: Secondary | ICD-10-CM | POA: Diagnosis not present

## 2018-12-06 DIAGNOSIS — I4891 Unspecified atrial fibrillation: Secondary | ICD-10-CM | POA: Diagnosis not present

## 2018-12-08 DIAGNOSIS — I4891 Unspecified atrial fibrillation: Secondary | ICD-10-CM | POA: Diagnosis not present

## 2018-12-08 DIAGNOSIS — I10 Essential (primary) hypertension: Secondary | ICD-10-CM | POA: Diagnosis not present

## 2018-12-08 DIAGNOSIS — F0150 Vascular dementia without behavioral disturbance: Secondary | ICD-10-CM | POA: Diagnosis not present

## 2018-12-08 DIAGNOSIS — I679 Cerebrovascular disease, unspecified: Secondary | ICD-10-CM | POA: Diagnosis not present

## 2018-12-08 DIAGNOSIS — G459 Transient cerebral ischemic attack, unspecified: Secondary | ICD-10-CM | POA: Diagnosis not present

## 2018-12-08 DIAGNOSIS — I495 Sick sinus syndrome: Secondary | ICD-10-CM | POA: Diagnosis not present

## 2018-12-10 DIAGNOSIS — G459 Transient cerebral ischemic attack, unspecified: Secondary | ICD-10-CM | POA: Diagnosis not present

## 2018-12-10 DIAGNOSIS — I10 Essential (primary) hypertension: Secondary | ICD-10-CM | POA: Diagnosis not present

## 2018-12-10 DIAGNOSIS — I495 Sick sinus syndrome: Secondary | ICD-10-CM | POA: Diagnosis not present

## 2018-12-10 DIAGNOSIS — F0150 Vascular dementia without behavioral disturbance: Secondary | ICD-10-CM | POA: Diagnosis not present

## 2018-12-10 DIAGNOSIS — I4891 Unspecified atrial fibrillation: Secondary | ICD-10-CM | POA: Diagnosis not present

## 2018-12-10 DIAGNOSIS — I679 Cerebrovascular disease, unspecified: Secondary | ICD-10-CM | POA: Diagnosis not present

## 2018-12-13 DIAGNOSIS — Z79899 Other long term (current) drug therapy: Secondary | ICD-10-CM | POA: Diagnosis not present

## 2018-12-13 DIAGNOSIS — F0150 Vascular dementia without behavioral disturbance: Secondary | ICD-10-CM | POA: Diagnosis not present

## 2018-12-13 DIAGNOSIS — F419 Anxiety disorder, unspecified: Secondary | ICD-10-CM | POA: Diagnosis not present

## 2018-12-13 DIAGNOSIS — I4891 Unspecified atrial fibrillation: Secondary | ICD-10-CM | POA: Diagnosis not present

## 2018-12-13 DIAGNOSIS — I679 Cerebrovascular disease, unspecified: Secondary | ICD-10-CM | POA: Diagnosis not present

## 2018-12-13 DIAGNOSIS — Z515 Encounter for palliative care: Secondary | ICD-10-CM | POA: Diagnosis not present

## 2018-12-13 DIAGNOSIS — G459 Transient cerebral ischemic attack, unspecified: Secondary | ICD-10-CM | POA: Diagnosis not present

## 2018-12-13 DIAGNOSIS — I10 Essential (primary) hypertension: Secondary | ICD-10-CM | POA: Diagnosis not present

## 2018-12-13 DIAGNOSIS — I495 Sick sinus syndrome: Secondary | ICD-10-CM | POA: Diagnosis not present

## 2018-12-15 DIAGNOSIS — I10 Essential (primary) hypertension: Secondary | ICD-10-CM | POA: Diagnosis not present

## 2018-12-15 DIAGNOSIS — I679 Cerebrovascular disease, unspecified: Secondary | ICD-10-CM | POA: Diagnosis not present

## 2018-12-15 DIAGNOSIS — F0150 Vascular dementia without behavioral disturbance: Secondary | ICD-10-CM | POA: Diagnosis not present

## 2018-12-15 DIAGNOSIS — I4891 Unspecified atrial fibrillation: Secondary | ICD-10-CM | POA: Diagnosis not present

## 2018-12-15 DIAGNOSIS — G459 Transient cerebral ischemic attack, unspecified: Secondary | ICD-10-CM | POA: Diagnosis not present

## 2018-12-15 DIAGNOSIS — I495 Sick sinus syndrome: Secondary | ICD-10-CM | POA: Diagnosis not present

## 2018-12-17 DIAGNOSIS — I4891 Unspecified atrial fibrillation: Secondary | ICD-10-CM | POA: Diagnosis not present

## 2018-12-17 DIAGNOSIS — I679 Cerebrovascular disease, unspecified: Secondary | ICD-10-CM | POA: Diagnosis not present

## 2018-12-17 DIAGNOSIS — G459 Transient cerebral ischemic attack, unspecified: Secondary | ICD-10-CM | POA: Diagnosis not present

## 2018-12-17 DIAGNOSIS — I10 Essential (primary) hypertension: Secondary | ICD-10-CM | POA: Diagnosis not present

## 2018-12-17 DIAGNOSIS — I495 Sick sinus syndrome: Secondary | ICD-10-CM | POA: Diagnosis not present

## 2018-12-17 DIAGNOSIS — F0150 Vascular dementia without behavioral disturbance: Secondary | ICD-10-CM | POA: Diagnosis not present

## 2018-12-20 DIAGNOSIS — I679 Cerebrovascular disease, unspecified: Secondary | ICD-10-CM | POA: Diagnosis not present

## 2018-12-20 DIAGNOSIS — F0150 Vascular dementia without behavioral disturbance: Secondary | ICD-10-CM | POA: Diagnosis not present

## 2018-12-20 DIAGNOSIS — I10 Essential (primary) hypertension: Secondary | ICD-10-CM | POA: Diagnosis not present

## 2018-12-20 DIAGNOSIS — G459 Transient cerebral ischemic attack, unspecified: Secondary | ICD-10-CM | POA: Diagnosis not present

## 2018-12-20 DIAGNOSIS — I495 Sick sinus syndrome: Secondary | ICD-10-CM | POA: Diagnosis not present

## 2018-12-20 DIAGNOSIS — I4891 Unspecified atrial fibrillation: Secondary | ICD-10-CM | POA: Diagnosis not present

## 2018-12-22 DIAGNOSIS — I10 Essential (primary) hypertension: Secondary | ICD-10-CM | POA: Diagnosis not present

## 2018-12-22 DIAGNOSIS — I679 Cerebrovascular disease, unspecified: Secondary | ICD-10-CM | POA: Diagnosis not present

## 2018-12-22 DIAGNOSIS — F0150 Vascular dementia without behavioral disturbance: Secondary | ICD-10-CM | POA: Diagnosis not present

## 2018-12-22 DIAGNOSIS — I495 Sick sinus syndrome: Secondary | ICD-10-CM | POA: Diagnosis not present

## 2018-12-22 DIAGNOSIS — I4891 Unspecified atrial fibrillation: Secondary | ICD-10-CM | POA: Diagnosis not present

## 2018-12-22 DIAGNOSIS — G459 Transient cerebral ischemic attack, unspecified: Secondary | ICD-10-CM | POA: Diagnosis not present

## 2018-12-24 DIAGNOSIS — G459 Transient cerebral ischemic attack, unspecified: Secondary | ICD-10-CM | POA: Diagnosis not present

## 2018-12-24 DIAGNOSIS — I4891 Unspecified atrial fibrillation: Secondary | ICD-10-CM | POA: Diagnosis not present

## 2018-12-24 DIAGNOSIS — F0150 Vascular dementia without behavioral disturbance: Secondary | ICD-10-CM | POA: Diagnosis not present

## 2018-12-24 DIAGNOSIS — I495 Sick sinus syndrome: Secondary | ICD-10-CM | POA: Diagnosis not present

## 2018-12-24 DIAGNOSIS — I679 Cerebrovascular disease, unspecified: Secondary | ICD-10-CM | POA: Diagnosis not present

## 2018-12-24 DIAGNOSIS — I10 Essential (primary) hypertension: Secondary | ICD-10-CM | POA: Diagnosis not present

## 2018-12-27 DIAGNOSIS — F419 Anxiety disorder, unspecified: Secondary | ICD-10-CM | POA: Diagnosis not present

## 2018-12-27 DIAGNOSIS — I10 Essential (primary) hypertension: Secondary | ICD-10-CM | POA: Diagnosis not present

## 2018-12-27 DIAGNOSIS — L89513 Pressure ulcer of right ankle, stage 3: Secondary | ICD-10-CM | POA: Diagnosis not present

## 2018-12-27 DIAGNOSIS — I4891 Unspecified atrial fibrillation: Secondary | ICD-10-CM | POA: Diagnosis not present

## 2018-12-27 DIAGNOSIS — F0150 Vascular dementia without behavioral disturbance: Secondary | ICD-10-CM | POA: Diagnosis not present

## 2018-12-27 DIAGNOSIS — G459 Transient cerebral ischemic attack, unspecified: Secondary | ICD-10-CM | POA: Diagnosis not present

## 2018-12-27 DIAGNOSIS — I679 Cerebrovascular disease, unspecified: Secondary | ICD-10-CM | POA: Diagnosis not present

## 2018-12-27 DIAGNOSIS — F331 Major depressive disorder, recurrent, moderate: Secondary | ICD-10-CM | POA: Diagnosis not present

## 2018-12-27 DIAGNOSIS — Z79899 Other long term (current) drug therapy: Secondary | ICD-10-CM | POA: Diagnosis not present

## 2018-12-27 DIAGNOSIS — I495 Sick sinus syndrome: Secondary | ICD-10-CM | POA: Diagnosis not present

## 2018-12-28 DIAGNOSIS — I495 Sick sinus syndrome: Secondary | ICD-10-CM | POA: Diagnosis not present

## 2018-12-28 DIAGNOSIS — I4891 Unspecified atrial fibrillation: Secondary | ICD-10-CM | POA: Diagnosis not present

## 2018-12-28 DIAGNOSIS — I679 Cerebrovascular disease, unspecified: Secondary | ICD-10-CM | POA: Diagnosis not present

## 2018-12-28 DIAGNOSIS — F0150 Vascular dementia without behavioral disturbance: Secondary | ICD-10-CM | POA: Diagnosis not present

## 2018-12-28 DIAGNOSIS — G459 Transient cerebral ischemic attack, unspecified: Secondary | ICD-10-CM | POA: Diagnosis not present

## 2018-12-28 DIAGNOSIS — I10 Essential (primary) hypertension: Secondary | ICD-10-CM | POA: Diagnosis not present

## 2018-12-29 DIAGNOSIS — I10 Essential (primary) hypertension: Secondary | ICD-10-CM | POA: Diagnosis not present

## 2018-12-29 DIAGNOSIS — I4891 Unspecified atrial fibrillation: Secondary | ICD-10-CM | POA: Diagnosis not present

## 2018-12-29 DIAGNOSIS — F0150 Vascular dementia without behavioral disturbance: Secondary | ICD-10-CM | POA: Diagnosis not present

## 2018-12-29 DIAGNOSIS — G459 Transient cerebral ischemic attack, unspecified: Secondary | ICD-10-CM | POA: Diagnosis not present

## 2018-12-29 DIAGNOSIS — I679 Cerebrovascular disease, unspecified: Secondary | ICD-10-CM | POA: Diagnosis not present

## 2018-12-29 DIAGNOSIS — I495 Sick sinus syndrome: Secondary | ICD-10-CM | POA: Diagnosis not present

## 2018-12-31 DIAGNOSIS — G459 Transient cerebral ischemic attack, unspecified: Secondary | ICD-10-CM | POA: Diagnosis not present

## 2018-12-31 DIAGNOSIS — I10 Essential (primary) hypertension: Secondary | ICD-10-CM | POA: Diagnosis not present

## 2018-12-31 DIAGNOSIS — I495 Sick sinus syndrome: Secondary | ICD-10-CM | POA: Diagnosis not present

## 2018-12-31 DIAGNOSIS — I4891 Unspecified atrial fibrillation: Secondary | ICD-10-CM | POA: Diagnosis not present

## 2018-12-31 DIAGNOSIS — F0150 Vascular dementia without behavioral disturbance: Secondary | ICD-10-CM | POA: Diagnosis not present

## 2018-12-31 DIAGNOSIS — I679 Cerebrovascular disease, unspecified: Secondary | ICD-10-CM | POA: Diagnosis not present

## 2019-01-01 DIAGNOSIS — K219 Gastro-esophageal reflux disease without esophagitis: Secondary | ICD-10-CM | POA: Diagnosis not present

## 2019-01-01 DIAGNOSIS — G459 Transient cerebral ischemic attack, unspecified: Secondary | ICD-10-CM | POA: Diagnosis not present

## 2019-01-01 DIAGNOSIS — E785 Hyperlipidemia, unspecified: Secondary | ICD-10-CM | POA: Diagnosis not present

## 2019-01-01 DIAGNOSIS — E46 Unspecified protein-calorie malnutrition: Secondary | ICD-10-CM | POA: Diagnosis not present

## 2019-01-01 DIAGNOSIS — Z86718 Personal history of other venous thrombosis and embolism: Secondary | ICD-10-CM | POA: Diagnosis not present

## 2019-01-01 DIAGNOSIS — I495 Sick sinus syndrome: Secondary | ICD-10-CM | POA: Diagnosis not present

## 2019-01-01 DIAGNOSIS — F339 Major depressive disorder, recurrent, unspecified: Secondary | ICD-10-CM | POA: Diagnosis not present

## 2019-01-01 DIAGNOSIS — M2450 Contracture, unspecified joint: Secondary | ICD-10-CM | POA: Diagnosis not present

## 2019-01-01 DIAGNOSIS — R1310 Dysphagia, unspecified: Secondary | ICD-10-CM | POA: Diagnosis not present

## 2019-01-01 DIAGNOSIS — E039 Hypothyroidism, unspecified: Secondary | ICD-10-CM | POA: Diagnosis not present

## 2019-01-01 DIAGNOSIS — R54 Age-related physical debility: Secondary | ICD-10-CM | POA: Diagnosis not present

## 2019-01-01 DIAGNOSIS — I10 Essential (primary) hypertension: Secondary | ICD-10-CM | POA: Diagnosis not present

## 2019-01-01 DIAGNOSIS — F0150 Vascular dementia without behavioral disturbance: Secondary | ICD-10-CM | POA: Diagnosis not present

## 2019-01-01 DIAGNOSIS — I679 Cerebrovascular disease, unspecified: Secondary | ICD-10-CM | POA: Diagnosis not present

## 2019-01-01 DIAGNOSIS — I4891 Unspecified atrial fibrillation: Secondary | ICD-10-CM | POA: Diagnosis not present

## 2019-01-03 DIAGNOSIS — I10 Essential (primary) hypertension: Secondary | ICD-10-CM | POA: Diagnosis not present

## 2019-01-03 DIAGNOSIS — I495 Sick sinus syndrome: Secondary | ICD-10-CM | POA: Diagnosis not present

## 2019-01-03 DIAGNOSIS — I679 Cerebrovascular disease, unspecified: Secondary | ICD-10-CM | POA: Diagnosis not present

## 2019-01-03 DIAGNOSIS — G459 Transient cerebral ischemic attack, unspecified: Secondary | ICD-10-CM | POA: Diagnosis not present

## 2019-01-03 DIAGNOSIS — F0150 Vascular dementia without behavioral disturbance: Secondary | ICD-10-CM | POA: Diagnosis not present

## 2019-01-03 DIAGNOSIS — I4891 Unspecified atrial fibrillation: Secondary | ICD-10-CM | POA: Diagnosis not present

## 2019-01-05 DIAGNOSIS — F0150 Vascular dementia without behavioral disturbance: Secondary | ICD-10-CM | POA: Diagnosis not present

## 2019-01-05 DIAGNOSIS — I495 Sick sinus syndrome: Secondary | ICD-10-CM | POA: Diagnosis not present

## 2019-01-05 DIAGNOSIS — I10 Essential (primary) hypertension: Secondary | ICD-10-CM | POA: Diagnosis not present

## 2019-01-05 DIAGNOSIS — I679 Cerebrovascular disease, unspecified: Secondary | ICD-10-CM | POA: Diagnosis not present

## 2019-01-05 DIAGNOSIS — I4891 Unspecified atrial fibrillation: Secondary | ICD-10-CM | POA: Diagnosis not present

## 2019-01-05 DIAGNOSIS — G459 Transient cerebral ischemic attack, unspecified: Secondary | ICD-10-CM | POA: Diagnosis not present

## 2019-01-07 DIAGNOSIS — I495 Sick sinus syndrome: Secondary | ICD-10-CM | POA: Diagnosis not present

## 2019-01-07 DIAGNOSIS — I679 Cerebrovascular disease, unspecified: Secondary | ICD-10-CM | POA: Diagnosis not present

## 2019-01-07 DIAGNOSIS — G459 Transient cerebral ischemic attack, unspecified: Secondary | ICD-10-CM | POA: Diagnosis not present

## 2019-01-07 DIAGNOSIS — I10 Essential (primary) hypertension: Secondary | ICD-10-CM | POA: Diagnosis not present

## 2019-01-07 DIAGNOSIS — I4891 Unspecified atrial fibrillation: Secondary | ICD-10-CM | POA: Diagnosis not present

## 2019-01-07 DIAGNOSIS — F0150 Vascular dementia without behavioral disturbance: Secondary | ICD-10-CM | POA: Diagnosis not present

## 2019-01-10 DIAGNOSIS — I495 Sick sinus syndrome: Secondary | ICD-10-CM | POA: Diagnosis not present

## 2019-01-10 DIAGNOSIS — G459 Transient cerebral ischemic attack, unspecified: Secondary | ICD-10-CM | POA: Diagnosis not present

## 2019-01-10 DIAGNOSIS — I10 Essential (primary) hypertension: Secondary | ICD-10-CM | POA: Diagnosis not present

## 2019-01-10 DIAGNOSIS — F0150 Vascular dementia without behavioral disturbance: Secondary | ICD-10-CM | POA: Diagnosis not present

## 2019-01-10 DIAGNOSIS — I4891 Unspecified atrial fibrillation: Secondary | ICD-10-CM | POA: Diagnosis not present

## 2019-01-10 DIAGNOSIS — I679 Cerebrovascular disease, unspecified: Secondary | ICD-10-CM | POA: Diagnosis not present

## 2019-01-12 DIAGNOSIS — I4891 Unspecified atrial fibrillation: Secondary | ICD-10-CM | POA: Diagnosis not present

## 2019-01-12 DIAGNOSIS — F0150 Vascular dementia without behavioral disturbance: Secondary | ICD-10-CM | POA: Diagnosis not present

## 2019-01-12 DIAGNOSIS — I679 Cerebrovascular disease, unspecified: Secondary | ICD-10-CM | POA: Diagnosis not present

## 2019-01-12 DIAGNOSIS — G459 Transient cerebral ischemic attack, unspecified: Secondary | ICD-10-CM | POA: Diagnosis not present

## 2019-01-12 DIAGNOSIS — I10 Essential (primary) hypertension: Secondary | ICD-10-CM | POA: Diagnosis not present

## 2019-01-12 DIAGNOSIS — I495 Sick sinus syndrome: Secondary | ICD-10-CM | POA: Diagnosis not present

## 2019-01-13 DIAGNOSIS — I10 Essential (primary) hypertension: Secondary | ICD-10-CM | POA: Diagnosis not present

## 2019-01-13 DIAGNOSIS — I679 Cerebrovascular disease, unspecified: Secondary | ICD-10-CM | POA: Diagnosis not present

## 2019-01-13 DIAGNOSIS — F0150 Vascular dementia without behavioral disturbance: Secondary | ICD-10-CM | POA: Diagnosis not present

## 2019-01-13 DIAGNOSIS — I495 Sick sinus syndrome: Secondary | ICD-10-CM | POA: Diagnosis not present

## 2019-01-13 DIAGNOSIS — G459 Transient cerebral ischemic attack, unspecified: Secondary | ICD-10-CM | POA: Diagnosis not present

## 2019-01-13 DIAGNOSIS — I4891 Unspecified atrial fibrillation: Secondary | ICD-10-CM | POA: Diagnosis not present

## 2019-01-14 DIAGNOSIS — I679 Cerebrovascular disease, unspecified: Secondary | ICD-10-CM | POA: Diagnosis not present

## 2019-01-14 DIAGNOSIS — I4891 Unspecified atrial fibrillation: Secondary | ICD-10-CM | POA: Diagnosis not present

## 2019-01-14 DIAGNOSIS — I495 Sick sinus syndrome: Secondary | ICD-10-CM | POA: Diagnosis not present

## 2019-01-14 DIAGNOSIS — G459 Transient cerebral ischemic attack, unspecified: Secondary | ICD-10-CM | POA: Diagnosis not present

## 2019-01-14 DIAGNOSIS — I10 Essential (primary) hypertension: Secondary | ICD-10-CM | POA: Diagnosis not present

## 2019-01-14 DIAGNOSIS — F0150 Vascular dementia without behavioral disturbance: Secondary | ICD-10-CM | POA: Diagnosis not present

## 2019-01-17 DIAGNOSIS — I10 Essential (primary) hypertension: Secondary | ICD-10-CM | POA: Diagnosis not present

## 2019-01-17 DIAGNOSIS — I679 Cerebrovascular disease, unspecified: Secondary | ICD-10-CM | POA: Diagnosis not present

## 2019-01-17 DIAGNOSIS — I495 Sick sinus syndrome: Secondary | ICD-10-CM | POA: Diagnosis not present

## 2019-01-17 DIAGNOSIS — F0150 Vascular dementia without behavioral disturbance: Secondary | ICD-10-CM | POA: Diagnosis not present

## 2019-01-17 DIAGNOSIS — G459 Transient cerebral ischemic attack, unspecified: Secondary | ICD-10-CM | POA: Diagnosis not present

## 2019-01-17 DIAGNOSIS — I4891 Unspecified atrial fibrillation: Secondary | ICD-10-CM | POA: Diagnosis not present

## 2019-01-19 DIAGNOSIS — I4891 Unspecified atrial fibrillation: Secondary | ICD-10-CM | POA: Diagnosis not present

## 2019-01-19 DIAGNOSIS — I495 Sick sinus syndrome: Secondary | ICD-10-CM | POA: Diagnosis not present

## 2019-01-19 DIAGNOSIS — F0150 Vascular dementia without behavioral disturbance: Secondary | ICD-10-CM | POA: Diagnosis not present

## 2019-01-19 DIAGNOSIS — G459 Transient cerebral ischemic attack, unspecified: Secondary | ICD-10-CM | POA: Diagnosis not present

## 2019-01-19 DIAGNOSIS — I10 Essential (primary) hypertension: Secondary | ICD-10-CM | POA: Diagnosis not present

## 2019-01-19 DIAGNOSIS — I679 Cerebrovascular disease, unspecified: Secondary | ICD-10-CM | POA: Diagnosis not present

## 2019-01-21 DIAGNOSIS — I495 Sick sinus syndrome: Secondary | ICD-10-CM | POA: Diagnosis not present

## 2019-01-21 DIAGNOSIS — I10 Essential (primary) hypertension: Secondary | ICD-10-CM | POA: Diagnosis not present

## 2019-01-21 DIAGNOSIS — I679 Cerebrovascular disease, unspecified: Secondary | ICD-10-CM | POA: Diagnosis not present

## 2019-01-21 DIAGNOSIS — I4891 Unspecified atrial fibrillation: Secondary | ICD-10-CM | POA: Diagnosis not present

## 2019-01-21 DIAGNOSIS — G459 Transient cerebral ischemic attack, unspecified: Secondary | ICD-10-CM | POA: Diagnosis not present

## 2019-01-21 DIAGNOSIS — F0150 Vascular dementia without behavioral disturbance: Secondary | ICD-10-CM | POA: Diagnosis not present

## 2019-01-24 DIAGNOSIS — I4891 Unspecified atrial fibrillation: Secondary | ICD-10-CM | POA: Diagnosis not present

## 2019-01-24 DIAGNOSIS — I495 Sick sinus syndrome: Secondary | ICD-10-CM | POA: Diagnosis not present

## 2019-01-24 DIAGNOSIS — F0150 Vascular dementia without behavioral disturbance: Secondary | ICD-10-CM | POA: Diagnosis not present

## 2019-01-24 DIAGNOSIS — G459 Transient cerebral ischemic attack, unspecified: Secondary | ICD-10-CM | POA: Diagnosis not present

## 2019-01-24 DIAGNOSIS — I679 Cerebrovascular disease, unspecified: Secondary | ICD-10-CM | POA: Diagnosis not present

## 2019-01-24 DIAGNOSIS — I10 Essential (primary) hypertension: Secondary | ICD-10-CM | POA: Diagnosis not present

## 2019-01-25 DIAGNOSIS — I495 Sick sinus syndrome: Secondary | ICD-10-CM | POA: Diagnosis not present

## 2019-01-25 DIAGNOSIS — I10 Essential (primary) hypertension: Secondary | ICD-10-CM | POA: Diagnosis not present

## 2019-01-25 DIAGNOSIS — I679 Cerebrovascular disease, unspecified: Secondary | ICD-10-CM | POA: Diagnosis not present

## 2019-01-25 DIAGNOSIS — G459 Transient cerebral ischemic attack, unspecified: Secondary | ICD-10-CM | POA: Diagnosis not present

## 2019-01-25 DIAGNOSIS — F0150 Vascular dementia without behavioral disturbance: Secondary | ICD-10-CM | POA: Diagnosis not present

## 2019-01-25 DIAGNOSIS — I4891 Unspecified atrial fibrillation: Secondary | ICD-10-CM | POA: Diagnosis not present

## 2019-01-26 DIAGNOSIS — I495 Sick sinus syndrome: Secondary | ICD-10-CM | POA: Diagnosis not present

## 2019-01-26 DIAGNOSIS — I679 Cerebrovascular disease, unspecified: Secondary | ICD-10-CM | POA: Diagnosis not present

## 2019-01-26 DIAGNOSIS — G459 Transient cerebral ischemic attack, unspecified: Secondary | ICD-10-CM | POA: Diagnosis not present

## 2019-01-26 DIAGNOSIS — I4891 Unspecified atrial fibrillation: Secondary | ICD-10-CM | POA: Diagnosis not present

## 2019-01-26 DIAGNOSIS — F0150 Vascular dementia without behavioral disturbance: Secondary | ICD-10-CM | POA: Diagnosis not present

## 2019-01-26 DIAGNOSIS — I10 Essential (primary) hypertension: Secondary | ICD-10-CM | POA: Diagnosis not present

## 2019-01-28 DIAGNOSIS — I495 Sick sinus syndrome: Secondary | ICD-10-CM | POA: Diagnosis not present

## 2019-01-28 DIAGNOSIS — I10 Essential (primary) hypertension: Secondary | ICD-10-CM | POA: Diagnosis not present

## 2019-01-28 DIAGNOSIS — I4891 Unspecified atrial fibrillation: Secondary | ICD-10-CM | POA: Diagnosis not present

## 2019-01-28 DIAGNOSIS — I679 Cerebrovascular disease, unspecified: Secondary | ICD-10-CM | POA: Diagnosis not present

## 2019-01-28 DIAGNOSIS — F0150 Vascular dementia without behavioral disturbance: Secondary | ICD-10-CM | POA: Diagnosis not present

## 2019-01-28 DIAGNOSIS — G459 Transient cerebral ischemic attack, unspecified: Secondary | ICD-10-CM | POA: Diagnosis not present

## 2019-01-30 DIAGNOSIS — G459 Transient cerebral ischemic attack, unspecified: Secondary | ICD-10-CM | POA: Diagnosis not present

## 2019-01-30 DIAGNOSIS — I10 Essential (primary) hypertension: Secondary | ICD-10-CM | POA: Diagnosis not present

## 2019-01-30 DIAGNOSIS — E46 Unspecified protein-calorie malnutrition: Secondary | ICD-10-CM | POA: Diagnosis not present

## 2019-01-30 DIAGNOSIS — E039 Hypothyroidism, unspecified: Secondary | ICD-10-CM | POA: Diagnosis not present

## 2019-01-30 DIAGNOSIS — F0150 Vascular dementia without behavioral disturbance: Secondary | ICD-10-CM | POA: Diagnosis not present

## 2019-01-30 DIAGNOSIS — M2450 Contracture, unspecified joint: Secondary | ICD-10-CM | POA: Diagnosis not present

## 2019-01-30 DIAGNOSIS — K219 Gastro-esophageal reflux disease without esophagitis: Secondary | ICD-10-CM | POA: Diagnosis not present

## 2019-01-30 DIAGNOSIS — I679 Cerebrovascular disease, unspecified: Secondary | ICD-10-CM | POA: Diagnosis not present

## 2019-01-30 DIAGNOSIS — Z86718 Personal history of other venous thrombosis and embolism: Secondary | ICD-10-CM | POA: Diagnosis not present

## 2019-01-30 DIAGNOSIS — I495 Sick sinus syndrome: Secondary | ICD-10-CM | POA: Diagnosis not present

## 2019-01-30 DIAGNOSIS — R1310 Dysphagia, unspecified: Secondary | ICD-10-CM | POA: Diagnosis not present

## 2019-01-30 DIAGNOSIS — E785 Hyperlipidemia, unspecified: Secondary | ICD-10-CM | POA: Diagnosis not present

## 2019-01-30 DIAGNOSIS — I4891 Unspecified atrial fibrillation: Secondary | ICD-10-CM | POA: Diagnosis not present

## 2019-01-30 DIAGNOSIS — F339 Major depressive disorder, recurrent, unspecified: Secondary | ICD-10-CM | POA: Diagnosis not present

## 2019-01-30 DIAGNOSIS — R54 Age-related physical debility: Secondary | ICD-10-CM | POA: Diagnosis not present

## 2019-01-31 DIAGNOSIS — F0150 Vascular dementia without behavioral disturbance: Secondary | ICD-10-CM | POA: Diagnosis not present

## 2019-01-31 DIAGNOSIS — I679 Cerebrovascular disease, unspecified: Secondary | ICD-10-CM | POA: Diagnosis not present

## 2019-01-31 DIAGNOSIS — G459 Transient cerebral ischemic attack, unspecified: Secondary | ICD-10-CM | POA: Diagnosis not present

## 2019-01-31 DIAGNOSIS — I10 Essential (primary) hypertension: Secondary | ICD-10-CM | POA: Diagnosis not present

## 2019-01-31 DIAGNOSIS — I495 Sick sinus syndrome: Secondary | ICD-10-CM | POA: Diagnosis not present

## 2019-01-31 DIAGNOSIS — I4891 Unspecified atrial fibrillation: Secondary | ICD-10-CM | POA: Diagnosis not present

## 2019-02-02 DIAGNOSIS — I4891 Unspecified atrial fibrillation: Secondary | ICD-10-CM | POA: Diagnosis not present

## 2019-02-02 DIAGNOSIS — I679 Cerebrovascular disease, unspecified: Secondary | ICD-10-CM | POA: Diagnosis not present

## 2019-02-02 DIAGNOSIS — I10 Essential (primary) hypertension: Secondary | ICD-10-CM | POA: Diagnosis not present

## 2019-02-02 DIAGNOSIS — F0150 Vascular dementia without behavioral disturbance: Secondary | ICD-10-CM | POA: Diagnosis not present

## 2019-02-02 DIAGNOSIS — I495 Sick sinus syndrome: Secondary | ICD-10-CM | POA: Diagnosis not present

## 2019-02-02 DIAGNOSIS — G459 Transient cerebral ischemic attack, unspecified: Secondary | ICD-10-CM | POA: Diagnosis not present

## 2019-02-04 DIAGNOSIS — I679 Cerebrovascular disease, unspecified: Secondary | ICD-10-CM | POA: Diagnosis not present

## 2019-02-04 DIAGNOSIS — G459 Transient cerebral ischemic attack, unspecified: Secondary | ICD-10-CM | POA: Diagnosis not present

## 2019-02-04 DIAGNOSIS — F0150 Vascular dementia without behavioral disturbance: Secondary | ICD-10-CM | POA: Diagnosis not present

## 2019-02-04 DIAGNOSIS — I495 Sick sinus syndrome: Secondary | ICD-10-CM | POA: Diagnosis not present

## 2019-02-04 DIAGNOSIS — I10 Essential (primary) hypertension: Secondary | ICD-10-CM | POA: Diagnosis not present

## 2019-02-04 DIAGNOSIS — I4891 Unspecified atrial fibrillation: Secondary | ICD-10-CM | POA: Diagnosis not present

## 2019-02-07 DIAGNOSIS — F0150 Vascular dementia without behavioral disturbance: Secondary | ICD-10-CM | POA: Diagnosis not present

## 2019-02-07 DIAGNOSIS — I10 Essential (primary) hypertension: Secondary | ICD-10-CM | POA: Diagnosis not present

## 2019-02-07 DIAGNOSIS — I495 Sick sinus syndrome: Secondary | ICD-10-CM | POA: Diagnosis not present

## 2019-02-07 DIAGNOSIS — I4891 Unspecified atrial fibrillation: Secondary | ICD-10-CM | POA: Diagnosis not present

## 2019-02-07 DIAGNOSIS — G459 Transient cerebral ischemic attack, unspecified: Secondary | ICD-10-CM | POA: Diagnosis not present

## 2019-02-07 DIAGNOSIS — I679 Cerebrovascular disease, unspecified: Secondary | ICD-10-CM | POA: Diagnosis not present

## 2019-02-09 DIAGNOSIS — I10 Essential (primary) hypertension: Secondary | ICD-10-CM | POA: Diagnosis not present

## 2019-02-09 DIAGNOSIS — I679 Cerebrovascular disease, unspecified: Secondary | ICD-10-CM | POA: Diagnosis not present

## 2019-02-09 DIAGNOSIS — I495 Sick sinus syndrome: Secondary | ICD-10-CM | POA: Diagnosis not present

## 2019-02-09 DIAGNOSIS — G459 Transient cerebral ischemic attack, unspecified: Secondary | ICD-10-CM | POA: Diagnosis not present

## 2019-02-09 DIAGNOSIS — F0150 Vascular dementia without behavioral disturbance: Secondary | ICD-10-CM | POA: Diagnosis not present

## 2019-02-09 DIAGNOSIS — I4891 Unspecified atrial fibrillation: Secondary | ICD-10-CM | POA: Diagnosis not present

## 2019-02-11 DIAGNOSIS — F0150 Vascular dementia without behavioral disturbance: Secondary | ICD-10-CM | POA: Diagnosis not present

## 2019-02-11 DIAGNOSIS — I495 Sick sinus syndrome: Secondary | ICD-10-CM | POA: Diagnosis not present

## 2019-02-11 DIAGNOSIS — I679 Cerebrovascular disease, unspecified: Secondary | ICD-10-CM | POA: Diagnosis not present

## 2019-02-11 DIAGNOSIS — G459 Transient cerebral ischemic attack, unspecified: Secondary | ICD-10-CM | POA: Diagnosis not present

## 2019-02-11 DIAGNOSIS — I10 Essential (primary) hypertension: Secondary | ICD-10-CM | POA: Diagnosis not present

## 2019-02-11 DIAGNOSIS — I4891 Unspecified atrial fibrillation: Secondary | ICD-10-CM | POA: Diagnosis not present

## 2019-02-14 DIAGNOSIS — I4891 Unspecified atrial fibrillation: Secondary | ICD-10-CM | POA: Diagnosis not present

## 2019-02-14 DIAGNOSIS — F0150 Vascular dementia without behavioral disturbance: Secondary | ICD-10-CM | POA: Diagnosis not present

## 2019-02-14 DIAGNOSIS — I679 Cerebrovascular disease, unspecified: Secondary | ICD-10-CM | POA: Diagnosis not present

## 2019-02-14 DIAGNOSIS — G459 Transient cerebral ischemic attack, unspecified: Secondary | ICD-10-CM | POA: Diagnosis not present

## 2019-02-14 DIAGNOSIS — I10 Essential (primary) hypertension: Secondary | ICD-10-CM | POA: Diagnosis not present

## 2019-02-14 DIAGNOSIS — I495 Sick sinus syndrome: Secondary | ICD-10-CM | POA: Diagnosis not present

## 2019-02-15 DIAGNOSIS — I495 Sick sinus syndrome: Secondary | ICD-10-CM | POA: Diagnosis not present

## 2019-02-15 DIAGNOSIS — I679 Cerebrovascular disease, unspecified: Secondary | ICD-10-CM | POA: Diagnosis not present

## 2019-02-15 DIAGNOSIS — I4891 Unspecified atrial fibrillation: Secondary | ICD-10-CM | POA: Diagnosis not present

## 2019-02-15 DIAGNOSIS — I10 Essential (primary) hypertension: Secondary | ICD-10-CM | POA: Diagnosis not present

## 2019-02-15 DIAGNOSIS — F0150 Vascular dementia without behavioral disturbance: Secondary | ICD-10-CM | POA: Diagnosis not present

## 2019-02-15 DIAGNOSIS — G459 Transient cerebral ischemic attack, unspecified: Secondary | ICD-10-CM | POA: Diagnosis not present

## 2019-02-16 DIAGNOSIS — I4891 Unspecified atrial fibrillation: Secondary | ICD-10-CM | POA: Diagnosis not present

## 2019-02-16 DIAGNOSIS — I10 Essential (primary) hypertension: Secondary | ICD-10-CM | POA: Diagnosis not present

## 2019-02-16 DIAGNOSIS — I679 Cerebrovascular disease, unspecified: Secondary | ICD-10-CM | POA: Diagnosis not present

## 2019-02-16 DIAGNOSIS — G459 Transient cerebral ischemic attack, unspecified: Secondary | ICD-10-CM | POA: Diagnosis not present

## 2019-02-16 DIAGNOSIS — I495 Sick sinus syndrome: Secondary | ICD-10-CM | POA: Diagnosis not present

## 2019-02-16 DIAGNOSIS — F0150 Vascular dementia without behavioral disturbance: Secondary | ICD-10-CM | POA: Diagnosis not present

## 2019-02-18 DIAGNOSIS — F0150 Vascular dementia without behavioral disturbance: Secondary | ICD-10-CM | POA: Diagnosis not present

## 2019-02-18 DIAGNOSIS — G459 Transient cerebral ischemic attack, unspecified: Secondary | ICD-10-CM | POA: Diagnosis not present

## 2019-02-18 DIAGNOSIS — I10 Essential (primary) hypertension: Secondary | ICD-10-CM | POA: Diagnosis not present

## 2019-02-18 DIAGNOSIS — I4891 Unspecified atrial fibrillation: Secondary | ICD-10-CM | POA: Diagnosis not present

## 2019-02-18 DIAGNOSIS — I679 Cerebrovascular disease, unspecified: Secondary | ICD-10-CM | POA: Diagnosis not present

## 2019-02-18 DIAGNOSIS — I495 Sick sinus syndrome: Secondary | ICD-10-CM | POA: Diagnosis not present

## 2019-02-21 DIAGNOSIS — I4891 Unspecified atrial fibrillation: Secondary | ICD-10-CM | POA: Diagnosis not present

## 2019-02-21 DIAGNOSIS — I495 Sick sinus syndrome: Secondary | ICD-10-CM | POA: Diagnosis not present

## 2019-02-21 DIAGNOSIS — I10 Essential (primary) hypertension: Secondary | ICD-10-CM | POA: Diagnosis not present

## 2019-02-21 DIAGNOSIS — G459 Transient cerebral ischemic attack, unspecified: Secondary | ICD-10-CM | POA: Diagnosis not present

## 2019-02-21 DIAGNOSIS — F0150 Vascular dementia without behavioral disturbance: Secondary | ICD-10-CM | POA: Diagnosis not present

## 2019-02-21 DIAGNOSIS — I679 Cerebrovascular disease, unspecified: Secondary | ICD-10-CM | POA: Diagnosis not present

## 2019-02-23 DIAGNOSIS — I10 Essential (primary) hypertension: Secondary | ICD-10-CM | POA: Diagnosis not present

## 2019-02-23 DIAGNOSIS — I495 Sick sinus syndrome: Secondary | ICD-10-CM | POA: Diagnosis not present

## 2019-02-23 DIAGNOSIS — F0150 Vascular dementia without behavioral disturbance: Secondary | ICD-10-CM | POA: Diagnosis not present

## 2019-02-23 DIAGNOSIS — I4891 Unspecified atrial fibrillation: Secondary | ICD-10-CM | POA: Diagnosis not present

## 2019-02-23 DIAGNOSIS — I679 Cerebrovascular disease, unspecified: Secondary | ICD-10-CM | POA: Diagnosis not present

## 2019-02-23 DIAGNOSIS — G459 Transient cerebral ischemic attack, unspecified: Secondary | ICD-10-CM | POA: Diagnosis not present

## 2019-02-25 DIAGNOSIS — I10 Essential (primary) hypertension: Secondary | ICD-10-CM | POA: Diagnosis not present

## 2019-02-25 DIAGNOSIS — I4891 Unspecified atrial fibrillation: Secondary | ICD-10-CM | POA: Diagnosis not present

## 2019-02-25 DIAGNOSIS — I679 Cerebrovascular disease, unspecified: Secondary | ICD-10-CM | POA: Diagnosis not present

## 2019-02-25 DIAGNOSIS — I495 Sick sinus syndrome: Secondary | ICD-10-CM | POA: Diagnosis not present

## 2019-02-25 DIAGNOSIS — F0150 Vascular dementia without behavioral disturbance: Secondary | ICD-10-CM | POA: Diagnosis not present

## 2019-02-25 DIAGNOSIS — G459 Transient cerebral ischemic attack, unspecified: Secondary | ICD-10-CM | POA: Diagnosis not present

## 2019-02-28 DIAGNOSIS — I495 Sick sinus syndrome: Secondary | ICD-10-CM | POA: Diagnosis not present

## 2019-02-28 DIAGNOSIS — F0150 Vascular dementia without behavioral disturbance: Secondary | ICD-10-CM | POA: Diagnosis not present

## 2019-02-28 DIAGNOSIS — G459 Transient cerebral ischemic attack, unspecified: Secondary | ICD-10-CM | POA: Diagnosis not present

## 2019-02-28 DIAGNOSIS — I679 Cerebrovascular disease, unspecified: Secondary | ICD-10-CM | POA: Diagnosis not present

## 2019-02-28 DIAGNOSIS — I10 Essential (primary) hypertension: Secondary | ICD-10-CM | POA: Diagnosis not present

## 2019-02-28 DIAGNOSIS — I4891 Unspecified atrial fibrillation: Secondary | ICD-10-CM | POA: Diagnosis not present

## 2019-03-02 DIAGNOSIS — K219 Gastro-esophageal reflux disease without esophagitis: Secondary | ICD-10-CM | POA: Diagnosis not present

## 2019-03-02 DIAGNOSIS — M2450 Contracture, unspecified joint: Secondary | ICD-10-CM | POA: Diagnosis not present

## 2019-03-02 DIAGNOSIS — Z86718 Personal history of other venous thrombosis and embolism: Secondary | ICD-10-CM | POA: Diagnosis not present

## 2019-03-02 DIAGNOSIS — E039 Hypothyroidism, unspecified: Secondary | ICD-10-CM | POA: Diagnosis not present

## 2019-03-02 DIAGNOSIS — I10 Essential (primary) hypertension: Secondary | ICD-10-CM | POA: Diagnosis not present

## 2019-03-02 DIAGNOSIS — I679 Cerebrovascular disease, unspecified: Secondary | ICD-10-CM | POA: Diagnosis not present

## 2019-03-02 DIAGNOSIS — I495 Sick sinus syndrome: Secondary | ICD-10-CM | POA: Diagnosis not present

## 2019-03-02 DIAGNOSIS — E785 Hyperlipidemia, unspecified: Secondary | ICD-10-CM | POA: Diagnosis not present

## 2019-03-02 DIAGNOSIS — R54 Age-related physical debility: Secondary | ICD-10-CM | POA: Diagnosis not present

## 2019-03-02 DIAGNOSIS — I4891 Unspecified atrial fibrillation: Secondary | ICD-10-CM | POA: Diagnosis not present

## 2019-03-02 DIAGNOSIS — F339 Major depressive disorder, recurrent, unspecified: Secondary | ICD-10-CM | POA: Diagnosis not present

## 2019-03-02 DIAGNOSIS — F0150 Vascular dementia without behavioral disturbance: Secondary | ICD-10-CM | POA: Diagnosis not present

## 2019-03-02 DIAGNOSIS — G459 Transient cerebral ischemic attack, unspecified: Secondary | ICD-10-CM | POA: Diagnosis not present

## 2019-03-02 DIAGNOSIS — R1310 Dysphagia, unspecified: Secondary | ICD-10-CM | POA: Diagnosis not present

## 2019-03-02 DIAGNOSIS — E46 Unspecified protein-calorie malnutrition: Secondary | ICD-10-CM | POA: Diagnosis not present

## 2019-03-03 DIAGNOSIS — F0150 Vascular dementia without behavioral disturbance: Secondary | ICD-10-CM | POA: Diagnosis not present

## 2019-03-03 DIAGNOSIS — G459 Transient cerebral ischemic attack, unspecified: Secondary | ICD-10-CM | POA: Diagnosis not present

## 2019-03-03 DIAGNOSIS — I495 Sick sinus syndrome: Secondary | ICD-10-CM | POA: Diagnosis not present

## 2019-03-03 DIAGNOSIS — I10 Essential (primary) hypertension: Secondary | ICD-10-CM | POA: Diagnosis not present

## 2019-03-03 DIAGNOSIS — I4891 Unspecified atrial fibrillation: Secondary | ICD-10-CM | POA: Diagnosis not present

## 2019-03-03 DIAGNOSIS — I679 Cerebrovascular disease, unspecified: Secondary | ICD-10-CM | POA: Diagnosis not present

## 2019-03-05 DIAGNOSIS — I4891 Unspecified atrial fibrillation: Secondary | ICD-10-CM | POA: Diagnosis not present

## 2019-03-05 DIAGNOSIS — G459 Transient cerebral ischemic attack, unspecified: Secondary | ICD-10-CM | POA: Diagnosis not present

## 2019-03-05 DIAGNOSIS — I679 Cerebrovascular disease, unspecified: Secondary | ICD-10-CM | POA: Diagnosis not present

## 2019-03-05 DIAGNOSIS — I495 Sick sinus syndrome: Secondary | ICD-10-CM | POA: Diagnosis not present

## 2019-03-05 DIAGNOSIS — F0150 Vascular dementia without behavioral disturbance: Secondary | ICD-10-CM | POA: Diagnosis not present

## 2019-03-05 DIAGNOSIS — I10 Essential (primary) hypertension: Secondary | ICD-10-CM | POA: Diagnosis not present

## 2019-03-07 DIAGNOSIS — G459 Transient cerebral ischemic attack, unspecified: Secondary | ICD-10-CM | POA: Diagnosis not present

## 2019-03-07 DIAGNOSIS — I679 Cerebrovascular disease, unspecified: Secondary | ICD-10-CM | POA: Diagnosis not present

## 2019-03-07 DIAGNOSIS — F0150 Vascular dementia without behavioral disturbance: Secondary | ICD-10-CM | POA: Diagnosis not present

## 2019-03-07 DIAGNOSIS — I495 Sick sinus syndrome: Secondary | ICD-10-CM | POA: Diagnosis not present

## 2019-03-07 DIAGNOSIS — I10 Essential (primary) hypertension: Secondary | ICD-10-CM | POA: Diagnosis not present

## 2019-03-07 DIAGNOSIS — I4891 Unspecified atrial fibrillation: Secondary | ICD-10-CM | POA: Diagnosis not present

## 2019-03-10 DIAGNOSIS — I4891 Unspecified atrial fibrillation: Secondary | ICD-10-CM | POA: Diagnosis not present

## 2019-03-10 DIAGNOSIS — I495 Sick sinus syndrome: Secondary | ICD-10-CM | POA: Diagnosis not present

## 2019-03-10 DIAGNOSIS — G459 Transient cerebral ischemic attack, unspecified: Secondary | ICD-10-CM | POA: Diagnosis not present

## 2019-03-10 DIAGNOSIS — I679 Cerebrovascular disease, unspecified: Secondary | ICD-10-CM | POA: Diagnosis not present

## 2019-03-10 DIAGNOSIS — I10 Essential (primary) hypertension: Secondary | ICD-10-CM | POA: Diagnosis not present

## 2019-03-10 DIAGNOSIS — F0150 Vascular dementia without behavioral disturbance: Secondary | ICD-10-CM | POA: Diagnosis not present

## 2019-03-14 DIAGNOSIS — I495 Sick sinus syndrome: Secondary | ICD-10-CM | POA: Diagnosis not present

## 2019-03-14 DIAGNOSIS — G459 Transient cerebral ischemic attack, unspecified: Secondary | ICD-10-CM | POA: Diagnosis not present

## 2019-03-14 DIAGNOSIS — I4891 Unspecified atrial fibrillation: Secondary | ICD-10-CM | POA: Diagnosis not present

## 2019-03-14 DIAGNOSIS — I679 Cerebrovascular disease, unspecified: Secondary | ICD-10-CM | POA: Diagnosis not present

## 2019-03-14 DIAGNOSIS — F0150 Vascular dementia without behavioral disturbance: Secondary | ICD-10-CM | POA: Diagnosis not present

## 2019-03-14 DIAGNOSIS — I10 Essential (primary) hypertension: Secondary | ICD-10-CM | POA: Diagnosis not present

## 2019-03-17 DIAGNOSIS — G459 Transient cerebral ischemic attack, unspecified: Secondary | ICD-10-CM | POA: Diagnosis not present

## 2019-03-17 DIAGNOSIS — I4891 Unspecified atrial fibrillation: Secondary | ICD-10-CM | POA: Diagnosis not present

## 2019-03-17 DIAGNOSIS — I10 Essential (primary) hypertension: Secondary | ICD-10-CM | POA: Diagnosis not present

## 2019-03-17 DIAGNOSIS — F0150 Vascular dementia without behavioral disturbance: Secondary | ICD-10-CM | POA: Diagnosis not present

## 2019-03-17 DIAGNOSIS — I679 Cerebrovascular disease, unspecified: Secondary | ICD-10-CM | POA: Diagnosis not present

## 2019-03-17 DIAGNOSIS — I495 Sick sinus syndrome: Secondary | ICD-10-CM | POA: Diagnosis not present

## 2019-03-21 DIAGNOSIS — I679 Cerebrovascular disease, unspecified: Secondary | ICD-10-CM | POA: Diagnosis not present

## 2019-03-21 DIAGNOSIS — G459 Transient cerebral ischemic attack, unspecified: Secondary | ICD-10-CM | POA: Diagnosis not present

## 2019-03-21 DIAGNOSIS — F0150 Vascular dementia without behavioral disturbance: Secondary | ICD-10-CM | POA: Diagnosis not present

## 2019-03-21 DIAGNOSIS — I495 Sick sinus syndrome: Secondary | ICD-10-CM | POA: Diagnosis not present

## 2019-03-21 DIAGNOSIS — I10 Essential (primary) hypertension: Secondary | ICD-10-CM | POA: Diagnosis not present

## 2019-03-21 DIAGNOSIS — I4891 Unspecified atrial fibrillation: Secondary | ICD-10-CM | POA: Diagnosis not present

## 2019-03-24 DIAGNOSIS — G459 Transient cerebral ischemic attack, unspecified: Secondary | ICD-10-CM | POA: Diagnosis not present

## 2019-03-24 DIAGNOSIS — I495 Sick sinus syndrome: Secondary | ICD-10-CM | POA: Diagnosis not present

## 2019-03-24 DIAGNOSIS — F0150 Vascular dementia without behavioral disturbance: Secondary | ICD-10-CM | POA: Diagnosis not present

## 2019-03-24 DIAGNOSIS — I679 Cerebrovascular disease, unspecified: Secondary | ICD-10-CM | POA: Diagnosis not present

## 2019-03-24 DIAGNOSIS — I10 Essential (primary) hypertension: Secondary | ICD-10-CM | POA: Diagnosis not present

## 2019-03-24 DIAGNOSIS — I4891 Unspecified atrial fibrillation: Secondary | ICD-10-CM | POA: Diagnosis not present

## 2019-03-28 ENCOUNTER — Telehealth: Payer: Self-pay | Admitting: Physician Assistant

## 2019-03-28 DIAGNOSIS — F0150 Vascular dementia without behavioral disturbance: Secondary | ICD-10-CM | POA: Diagnosis not present

## 2019-03-28 DIAGNOSIS — I4891 Unspecified atrial fibrillation: Secondary | ICD-10-CM | POA: Diagnosis not present

## 2019-03-28 DIAGNOSIS — I679 Cerebrovascular disease, unspecified: Secondary | ICD-10-CM | POA: Diagnosis not present

## 2019-03-28 DIAGNOSIS — G459 Transient cerebral ischemic attack, unspecified: Secondary | ICD-10-CM | POA: Diagnosis not present

## 2019-03-28 DIAGNOSIS — I10 Essential (primary) hypertension: Secondary | ICD-10-CM | POA: Diagnosis not present

## 2019-03-28 DIAGNOSIS — I495 Sick sinus syndrome: Secondary | ICD-10-CM | POA: Diagnosis not present

## 2019-03-28 NOTE — Telephone Encounter (Signed)
Called 2/2 overdue pacemaker check.  Patient's daughter Alger Simons) answered, confirmed patient name/birthday.  Reports her Mom living at [facility].  I inquired about pacermaker transmitter if they had it there to get a pacemaker check done remotely.  She stated they did not and they are no longer pursuing any testing of any kind, that her mother's condition has "advanced quite a bit" and no longer want her pacemaker checked.  I will notify Dr. Rayann Heman and DC for final decision if need for further information/documentation to mark patient inactive in [place].  Tommye Standard, PA-C

## 2019-03-29 NOTE — Telephone Encounter (Signed)
Spoke with patient's daughter, Christy Miranda. She reports that patient is "pretty much comfort care" and is receiving hospice care. Asked if comfort care orders and DNR are in place at [facility], Christy Miranda reports that they are. She is currently unable to see patient, but gets her updates from the doctor on call and from patient's nurses at the facility. Christy Miranda reports that "even if she [the patient] needed a battery, we wouldn't put her through it." She has had a recurrent "leg infection", has received a couple of rounds of antibiotics. Christy Miranda states that if the infection returns, they are no longer going to treat it.   Offered to contact nurse at facility to discuss remote monitoring, but Christy Miranda declines. She verbalizes understanding of explanation that without remote monitoring, we are unable to follow pt's device function and will not know if any issues arise with pacemaker lead or battery. Per last remote transmission on 03/22/18, pt V-paces 32%, battery longevity estimated at 4 years (range: 2.5-5 years). Christy Miranda is aware I will send this message to Dr. Rayann Heman as an Juluis Rainier. Christy Miranda agrees to call if pt's status changes and if they would like to resume PPM f/u. She denies additional questions at this time and thanked me for my call.

## 2019-03-31 DIAGNOSIS — I495 Sick sinus syndrome: Secondary | ICD-10-CM | POA: Diagnosis not present

## 2019-03-31 DIAGNOSIS — G459 Transient cerebral ischemic attack, unspecified: Secondary | ICD-10-CM | POA: Diagnosis not present

## 2019-03-31 DIAGNOSIS — I4891 Unspecified atrial fibrillation: Secondary | ICD-10-CM | POA: Diagnosis not present

## 2019-03-31 DIAGNOSIS — F0150 Vascular dementia without behavioral disturbance: Secondary | ICD-10-CM | POA: Diagnosis not present

## 2019-03-31 DIAGNOSIS — I10 Essential (primary) hypertension: Secondary | ICD-10-CM | POA: Diagnosis not present

## 2019-03-31 DIAGNOSIS — I679 Cerebrovascular disease, unspecified: Secondary | ICD-10-CM | POA: Diagnosis not present

## 2019-04-01 DIAGNOSIS — E46 Unspecified protein-calorie malnutrition: Secondary | ICD-10-CM | POA: Diagnosis not present

## 2019-04-01 DIAGNOSIS — F0150 Vascular dementia without behavioral disturbance: Secondary | ICD-10-CM | POA: Diagnosis not present

## 2019-04-01 DIAGNOSIS — I69320 Aphasia following cerebral infarction: Secondary | ICD-10-CM | POA: Diagnosis not present

## 2019-04-01 DIAGNOSIS — E039 Hypothyroidism, unspecified: Secondary | ICD-10-CM | POA: Diagnosis not present

## 2019-04-01 DIAGNOSIS — Z86718 Personal history of other venous thrombosis and embolism: Secondary | ICD-10-CM | POA: Diagnosis not present

## 2019-04-01 DIAGNOSIS — K219 Gastro-esophageal reflux disease without esophagitis: Secondary | ICD-10-CM | POA: Diagnosis not present

## 2019-04-01 DIAGNOSIS — I4891 Unspecified atrial fibrillation: Secondary | ICD-10-CM | POA: Diagnosis not present

## 2019-04-01 DIAGNOSIS — R54 Age-related physical debility: Secondary | ICD-10-CM | POA: Diagnosis not present

## 2019-04-01 DIAGNOSIS — F329 Major depressive disorder, single episode, unspecified: Secondary | ICD-10-CM | POA: Diagnosis not present

## 2019-04-01 DIAGNOSIS — Z741 Need for assistance with personal care: Secondary | ICD-10-CM | POA: Diagnosis not present

## 2019-04-01 DIAGNOSIS — E785 Hyperlipidemia, unspecified: Secondary | ICD-10-CM | POA: Diagnosis not present

## 2019-04-01 DIAGNOSIS — I69391 Dysphagia following cerebral infarction: Secondary | ICD-10-CM | POA: Diagnosis not present

## 2019-04-01 DIAGNOSIS — I69351 Hemiplegia and hemiparesis following cerebral infarction affecting right dominant side: Secondary | ICD-10-CM | POA: Diagnosis not present

## 2019-04-01 DIAGNOSIS — I495 Sick sinus syndrome: Secondary | ICD-10-CM | POA: Diagnosis not present

## 2019-04-01 DIAGNOSIS — Z950 Presence of cardiac pacemaker: Secondary | ICD-10-CM | POA: Diagnosis not present

## 2019-04-01 DIAGNOSIS — I10 Essential (primary) hypertension: Secondary | ICD-10-CM | POA: Diagnosis not present

## 2019-04-01 DIAGNOSIS — M436 Torticollis: Secondary | ICD-10-CM | POA: Diagnosis not present

## 2019-04-01 DIAGNOSIS — L89513 Pressure ulcer of right ankle, stage 3: Secondary | ICD-10-CM | POA: Diagnosis not present

## 2019-04-04 DIAGNOSIS — I69320 Aphasia following cerebral infarction: Secondary | ICD-10-CM | POA: Diagnosis not present

## 2019-04-04 DIAGNOSIS — I495 Sick sinus syndrome: Secondary | ICD-10-CM | POA: Diagnosis not present

## 2019-04-04 DIAGNOSIS — I69391 Dysphagia following cerebral infarction: Secondary | ICD-10-CM | POA: Diagnosis not present

## 2019-04-04 DIAGNOSIS — I4891 Unspecified atrial fibrillation: Secondary | ICD-10-CM | POA: Diagnosis not present

## 2019-04-04 DIAGNOSIS — I69351 Hemiplegia and hemiparesis following cerebral infarction affecting right dominant side: Secondary | ICD-10-CM | POA: Diagnosis not present

## 2019-04-04 DIAGNOSIS — F0150 Vascular dementia without behavioral disturbance: Secondary | ICD-10-CM | POA: Diagnosis not present

## 2019-04-07 DIAGNOSIS — I69320 Aphasia following cerebral infarction: Secondary | ICD-10-CM | POA: Diagnosis not present

## 2019-04-07 DIAGNOSIS — I495 Sick sinus syndrome: Secondary | ICD-10-CM | POA: Diagnosis not present

## 2019-04-07 DIAGNOSIS — F0150 Vascular dementia without behavioral disturbance: Secondary | ICD-10-CM | POA: Diagnosis not present

## 2019-04-07 DIAGNOSIS — I69391 Dysphagia following cerebral infarction: Secondary | ICD-10-CM | POA: Diagnosis not present

## 2019-04-07 DIAGNOSIS — I4891 Unspecified atrial fibrillation: Secondary | ICD-10-CM | POA: Diagnosis not present

## 2019-04-07 DIAGNOSIS — I69351 Hemiplegia and hemiparesis following cerebral infarction affecting right dominant side: Secondary | ICD-10-CM | POA: Diagnosis not present

## 2019-04-11 DIAGNOSIS — F0150 Vascular dementia without behavioral disturbance: Secondary | ICD-10-CM | POA: Diagnosis not present

## 2019-04-11 DIAGNOSIS — I69391 Dysphagia following cerebral infarction: Secondary | ICD-10-CM | POA: Diagnosis not present

## 2019-04-11 DIAGNOSIS — I69351 Hemiplegia and hemiparesis following cerebral infarction affecting right dominant side: Secondary | ICD-10-CM | POA: Diagnosis not present

## 2019-04-11 DIAGNOSIS — I495 Sick sinus syndrome: Secondary | ICD-10-CM | POA: Diagnosis not present

## 2019-04-11 DIAGNOSIS — I69320 Aphasia following cerebral infarction: Secondary | ICD-10-CM | POA: Diagnosis not present

## 2019-04-11 DIAGNOSIS — I4891 Unspecified atrial fibrillation: Secondary | ICD-10-CM | POA: Diagnosis not present

## 2019-04-14 DIAGNOSIS — I69320 Aphasia following cerebral infarction: Secondary | ICD-10-CM | POA: Diagnosis not present

## 2019-04-14 DIAGNOSIS — I69351 Hemiplegia and hemiparesis following cerebral infarction affecting right dominant side: Secondary | ICD-10-CM | POA: Diagnosis not present

## 2019-04-14 DIAGNOSIS — I495 Sick sinus syndrome: Secondary | ICD-10-CM | POA: Diagnosis not present

## 2019-04-14 DIAGNOSIS — F0150 Vascular dementia without behavioral disturbance: Secondary | ICD-10-CM | POA: Diagnosis not present

## 2019-04-14 DIAGNOSIS — I4891 Unspecified atrial fibrillation: Secondary | ICD-10-CM | POA: Diagnosis not present

## 2019-04-14 DIAGNOSIS — I69391 Dysphagia following cerebral infarction: Secondary | ICD-10-CM | POA: Diagnosis not present

## 2019-04-18 DIAGNOSIS — I69351 Hemiplegia and hemiparesis following cerebral infarction affecting right dominant side: Secondary | ICD-10-CM | POA: Diagnosis not present

## 2019-04-18 DIAGNOSIS — I69320 Aphasia following cerebral infarction: Secondary | ICD-10-CM | POA: Diagnosis not present

## 2019-04-18 DIAGNOSIS — I495 Sick sinus syndrome: Secondary | ICD-10-CM | POA: Diagnosis not present

## 2019-04-18 DIAGNOSIS — I69391 Dysphagia following cerebral infarction: Secondary | ICD-10-CM | POA: Diagnosis not present

## 2019-04-18 DIAGNOSIS — F0150 Vascular dementia without behavioral disturbance: Secondary | ICD-10-CM | POA: Diagnosis not present

## 2019-04-18 DIAGNOSIS — I4891 Unspecified atrial fibrillation: Secondary | ICD-10-CM | POA: Diagnosis not present

## 2019-04-21 DIAGNOSIS — F0150 Vascular dementia without behavioral disturbance: Secondary | ICD-10-CM | POA: Diagnosis not present

## 2019-04-21 DIAGNOSIS — I495 Sick sinus syndrome: Secondary | ICD-10-CM | POA: Diagnosis not present

## 2019-04-21 DIAGNOSIS — I4891 Unspecified atrial fibrillation: Secondary | ICD-10-CM | POA: Diagnosis not present

## 2019-04-21 DIAGNOSIS — I69351 Hemiplegia and hemiparesis following cerebral infarction affecting right dominant side: Secondary | ICD-10-CM | POA: Diagnosis not present

## 2019-04-21 DIAGNOSIS — I69391 Dysphagia following cerebral infarction: Secondary | ICD-10-CM | POA: Diagnosis not present

## 2019-04-21 DIAGNOSIS — I69320 Aphasia following cerebral infarction: Secondary | ICD-10-CM | POA: Diagnosis not present

## 2019-04-26 DIAGNOSIS — I69391 Dysphagia following cerebral infarction: Secondary | ICD-10-CM | POA: Diagnosis not present

## 2019-04-26 DIAGNOSIS — I69351 Hemiplegia and hemiparesis following cerebral infarction affecting right dominant side: Secondary | ICD-10-CM | POA: Diagnosis not present

## 2019-04-26 DIAGNOSIS — I69320 Aphasia following cerebral infarction: Secondary | ICD-10-CM | POA: Diagnosis not present

## 2019-04-26 DIAGNOSIS — I495 Sick sinus syndrome: Secondary | ICD-10-CM | POA: Diagnosis not present

## 2019-04-26 DIAGNOSIS — I4891 Unspecified atrial fibrillation: Secondary | ICD-10-CM | POA: Diagnosis not present

## 2019-04-26 DIAGNOSIS — F0150 Vascular dementia without behavioral disturbance: Secondary | ICD-10-CM | POA: Diagnosis not present

## 2019-04-28 DIAGNOSIS — I69320 Aphasia following cerebral infarction: Secondary | ICD-10-CM | POA: Diagnosis not present

## 2019-04-28 DIAGNOSIS — F0150 Vascular dementia without behavioral disturbance: Secondary | ICD-10-CM | POA: Diagnosis not present

## 2019-04-28 DIAGNOSIS — I69351 Hemiplegia and hemiparesis following cerebral infarction affecting right dominant side: Secondary | ICD-10-CM | POA: Diagnosis not present

## 2019-04-28 DIAGNOSIS — I4891 Unspecified atrial fibrillation: Secondary | ICD-10-CM | POA: Diagnosis not present

## 2019-04-28 DIAGNOSIS — I495 Sick sinus syndrome: Secondary | ICD-10-CM | POA: Diagnosis not present

## 2019-04-28 DIAGNOSIS — I69391 Dysphagia following cerebral infarction: Secondary | ICD-10-CM | POA: Diagnosis not present

## 2019-05-02 DIAGNOSIS — K219 Gastro-esophageal reflux disease without esophagitis: Secondary | ICD-10-CM | POA: Diagnosis not present

## 2019-05-02 DIAGNOSIS — R54 Age-related physical debility: Secondary | ICD-10-CM | POA: Diagnosis not present

## 2019-05-02 DIAGNOSIS — L89513 Pressure ulcer of right ankle, stage 3: Secondary | ICD-10-CM | POA: Diagnosis not present

## 2019-05-02 DIAGNOSIS — I10 Essential (primary) hypertension: Secondary | ICD-10-CM | POA: Diagnosis not present

## 2019-05-02 DIAGNOSIS — I69351 Hemiplegia and hemiparesis following cerebral infarction affecting right dominant side: Secondary | ICD-10-CM | POA: Diagnosis not present

## 2019-05-02 DIAGNOSIS — I4891 Unspecified atrial fibrillation: Secondary | ICD-10-CM | POA: Diagnosis not present

## 2019-05-02 DIAGNOSIS — Z86718 Personal history of other venous thrombosis and embolism: Secondary | ICD-10-CM | POA: Diagnosis not present

## 2019-05-02 DIAGNOSIS — F0150 Vascular dementia without behavioral disturbance: Secondary | ICD-10-CM | POA: Diagnosis not present

## 2019-05-02 DIAGNOSIS — I69391 Dysphagia following cerebral infarction: Secondary | ICD-10-CM | POA: Diagnosis not present

## 2019-05-02 DIAGNOSIS — F329 Major depressive disorder, single episode, unspecified: Secondary | ICD-10-CM | POA: Diagnosis not present

## 2019-05-02 DIAGNOSIS — I495 Sick sinus syndrome: Secondary | ICD-10-CM | POA: Diagnosis not present

## 2019-05-02 DIAGNOSIS — Z950 Presence of cardiac pacemaker: Secondary | ICD-10-CM | POA: Diagnosis not present

## 2019-05-02 DIAGNOSIS — I69320 Aphasia following cerebral infarction: Secondary | ICD-10-CM | POA: Diagnosis not present

## 2019-05-02 DIAGNOSIS — M436 Torticollis: Secondary | ICD-10-CM | POA: Diagnosis not present

## 2019-05-02 DIAGNOSIS — Z741 Need for assistance with personal care: Secondary | ICD-10-CM | POA: Diagnosis not present

## 2019-05-02 DIAGNOSIS — F419 Anxiety disorder, unspecified: Secondary | ICD-10-CM | POA: Diagnosis not present

## 2019-05-02 DIAGNOSIS — E785 Hyperlipidemia, unspecified: Secondary | ICD-10-CM | POA: Diagnosis not present

## 2019-05-02 DIAGNOSIS — E039 Hypothyroidism, unspecified: Secondary | ICD-10-CM | POA: Diagnosis not present

## 2019-05-02 DIAGNOSIS — E46 Unspecified protein-calorie malnutrition: Secondary | ICD-10-CM | POA: Diagnosis not present

## 2019-05-05 DIAGNOSIS — I69391 Dysphagia following cerebral infarction: Secondary | ICD-10-CM | POA: Diagnosis not present

## 2019-05-05 DIAGNOSIS — I69351 Hemiplegia and hemiparesis following cerebral infarction affecting right dominant side: Secondary | ICD-10-CM | POA: Diagnosis not present

## 2019-05-05 DIAGNOSIS — I495 Sick sinus syndrome: Secondary | ICD-10-CM | POA: Diagnosis not present

## 2019-05-05 DIAGNOSIS — I4891 Unspecified atrial fibrillation: Secondary | ICD-10-CM | POA: Diagnosis not present

## 2019-05-05 DIAGNOSIS — F0150 Vascular dementia without behavioral disturbance: Secondary | ICD-10-CM | POA: Diagnosis not present

## 2019-05-05 DIAGNOSIS — I69320 Aphasia following cerebral infarction: Secondary | ICD-10-CM | POA: Diagnosis not present

## 2019-05-09 DIAGNOSIS — I69391 Dysphagia following cerebral infarction: Secondary | ICD-10-CM | POA: Diagnosis not present

## 2019-05-09 DIAGNOSIS — I495 Sick sinus syndrome: Secondary | ICD-10-CM | POA: Diagnosis not present

## 2019-05-09 DIAGNOSIS — I69351 Hemiplegia and hemiparesis following cerebral infarction affecting right dominant side: Secondary | ICD-10-CM | POA: Diagnosis not present

## 2019-05-09 DIAGNOSIS — I69320 Aphasia following cerebral infarction: Secondary | ICD-10-CM | POA: Diagnosis not present

## 2019-05-09 DIAGNOSIS — I4891 Unspecified atrial fibrillation: Secondary | ICD-10-CM | POA: Diagnosis not present

## 2019-05-09 DIAGNOSIS — F0150 Vascular dementia without behavioral disturbance: Secondary | ICD-10-CM | POA: Diagnosis not present

## 2019-05-12 DIAGNOSIS — I69351 Hemiplegia and hemiparesis following cerebral infarction affecting right dominant side: Secondary | ICD-10-CM | POA: Diagnosis not present

## 2019-05-12 DIAGNOSIS — I495 Sick sinus syndrome: Secondary | ICD-10-CM | POA: Diagnosis not present

## 2019-05-12 DIAGNOSIS — F0150 Vascular dementia without behavioral disturbance: Secondary | ICD-10-CM | POA: Diagnosis not present

## 2019-05-12 DIAGNOSIS — I69320 Aphasia following cerebral infarction: Secondary | ICD-10-CM | POA: Diagnosis not present

## 2019-05-12 DIAGNOSIS — I4891 Unspecified atrial fibrillation: Secondary | ICD-10-CM | POA: Diagnosis not present

## 2019-05-12 DIAGNOSIS — I69391 Dysphagia following cerebral infarction: Secondary | ICD-10-CM | POA: Diagnosis not present

## 2019-05-16 DIAGNOSIS — I69351 Hemiplegia and hemiparesis following cerebral infarction affecting right dominant side: Secondary | ICD-10-CM | POA: Diagnosis not present

## 2019-05-16 DIAGNOSIS — I69320 Aphasia following cerebral infarction: Secondary | ICD-10-CM | POA: Diagnosis not present

## 2019-05-16 DIAGNOSIS — I69391 Dysphagia following cerebral infarction: Secondary | ICD-10-CM | POA: Diagnosis not present

## 2019-05-16 DIAGNOSIS — F0150 Vascular dementia without behavioral disturbance: Secondary | ICD-10-CM | POA: Diagnosis not present

## 2019-05-16 DIAGNOSIS — I495 Sick sinus syndrome: Secondary | ICD-10-CM | POA: Diagnosis not present

## 2019-05-16 DIAGNOSIS — I4891 Unspecified atrial fibrillation: Secondary | ICD-10-CM | POA: Diagnosis not present

## 2019-05-19 DIAGNOSIS — I4891 Unspecified atrial fibrillation: Secondary | ICD-10-CM | POA: Diagnosis not present

## 2019-05-19 DIAGNOSIS — I69320 Aphasia following cerebral infarction: Secondary | ICD-10-CM | POA: Diagnosis not present

## 2019-05-19 DIAGNOSIS — I69351 Hemiplegia and hemiparesis following cerebral infarction affecting right dominant side: Secondary | ICD-10-CM | POA: Diagnosis not present

## 2019-05-19 DIAGNOSIS — I69391 Dysphagia following cerebral infarction: Secondary | ICD-10-CM | POA: Diagnosis not present

## 2019-05-19 DIAGNOSIS — F0150 Vascular dementia without behavioral disturbance: Secondary | ICD-10-CM | POA: Diagnosis not present

## 2019-05-19 DIAGNOSIS — I495 Sick sinus syndrome: Secondary | ICD-10-CM | POA: Diagnosis not present

## 2019-05-23 DIAGNOSIS — I4891 Unspecified atrial fibrillation: Secondary | ICD-10-CM | POA: Diagnosis not present

## 2019-05-23 DIAGNOSIS — I495 Sick sinus syndrome: Secondary | ICD-10-CM | POA: Diagnosis not present

## 2019-05-23 DIAGNOSIS — I69391 Dysphagia following cerebral infarction: Secondary | ICD-10-CM | POA: Diagnosis not present

## 2019-05-23 DIAGNOSIS — I69320 Aphasia following cerebral infarction: Secondary | ICD-10-CM | POA: Diagnosis not present

## 2019-05-23 DIAGNOSIS — I69351 Hemiplegia and hemiparesis following cerebral infarction affecting right dominant side: Secondary | ICD-10-CM | POA: Diagnosis not present

## 2019-05-23 DIAGNOSIS — F0150 Vascular dementia without behavioral disturbance: Secondary | ICD-10-CM | POA: Diagnosis not present

## 2019-05-26 DIAGNOSIS — I69351 Hemiplegia and hemiparesis following cerebral infarction affecting right dominant side: Secondary | ICD-10-CM | POA: Diagnosis not present

## 2019-05-26 DIAGNOSIS — F0150 Vascular dementia without behavioral disturbance: Secondary | ICD-10-CM | POA: Diagnosis not present

## 2019-05-26 DIAGNOSIS — I495 Sick sinus syndrome: Secondary | ICD-10-CM | POA: Diagnosis not present

## 2019-05-26 DIAGNOSIS — I69320 Aphasia following cerebral infarction: Secondary | ICD-10-CM | POA: Diagnosis not present

## 2019-05-26 DIAGNOSIS — I4891 Unspecified atrial fibrillation: Secondary | ICD-10-CM | POA: Diagnosis not present

## 2019-05-26 DIAGNOSIS — I69391 Dysphagia following cerebral infarction: Secondary | ICD-10-CM | POA: Diagnosis not present

## 2019-05-30 DIAGNOSIS — I69391 Dysphagia following cerebral infarction: Secondary | ICD-10-CM | POA: Diagnosis not present

## 2019-05-30 DIAGNOSIS — I4891 Unspecified atrial fibrillation: Secondary | ICD-10-CM | POA: Diagnosis not present

## 2019-05-30 DIAGNOSIS — I69351 Hemiplegia and hemiparesis following cerebral infarction affecting right dominant side: Secondary | ICD-10-CM | POA: Diagnosis not present

## 2019-05-30 DIAGNOSIS — F0150 Vascular dementia without behavioral disturbance: Secondary | ICD-10-CM | POA: Diagnosis not present

## 2019-05-30 DIAGNOSIS — I495 Sick sinus syndrome: Secondary | ICD-10-CM | POA: Diagnosis not present

## 2019-05-30 DIAGNOSIS — I69320 Aphasia following cerebral infarction: Secondary | ICD-10-CM | POA: Diagnosis not present

## 2019-06-01 DIAGNOSIS — L89513 Pressure ulcer of right ankle, stage 3: Secondary | ICD-10-CM | POA: Diagnosis not present

## 2019-06-01 DIAGNOSIS — I69391 Dysphagia following cerebral infarction: Secondary | ICD-10-CM | POA: Diagnosis not present

## 2019-06-01 DIAGNOSIS — I495 Sick sinus syndrome: Secondary | ICD-10-CM | POA: Diagnosis not present

## 2019-06-01 DIAGNOSIS — Z741 Need for assistance with personal care: Secondary | ICD-10-CM | POA: Diagnosis not present

## 2019-06-01 DIAGNOSIS — M24561 Contracture, right knee: Secondary | ICD-10-CM | POA: Diagnosis not present

## 2019-06-01 DIAGNOSIS — L89899 Pressure ulcer of other site, unspecified stage: Secondary | ICD-10-CM | POA: Diagnosis not present

## 2019-06-01 DIAGNOSIS — M436 Torticollis: Secondary | ICD-10-CM | POA: Diagnosis not present

## 2019-06-01 DIAGNOSIS — E039 Hypothyroidism, unspecified: Secondary | ICD-10-CM | POA: Diagnosis not present

## 2019-06-01 DIAGNOSIS — F0150 Vascular dementia without behavioral disturbance: Secondary | ICD-10-CM | POA: Diagnosis not present

## 2019-06-01 DIAGNOSIS — I69351 Hemiplegia and hemiparesis following cerebral infarction affecting right dominant side: Secondary | ICD-10-CM | POA: Diagnosis not present

## 2019-06-01 DIAGNOSIS — I69320 Aphasia following cerebral infarction: Secondary | ICD-10-CM | POA: Diagnosis not present

## 2019-06-01 DIAGNOSIS — Z86718 Personal history of other venous thrombosis and embolism: Secondary | ICD-10-CM | POA: Diagnosis not present

## 2019-06-01 DIAGNOSIS — E46 Unspecified protein-calorie malnutrition: Secondary | ICD-10-CM | POA: Diagnosis not present

## 2019-06-01 DIAGNOSIS — F329 Major depressive disorder, single episode, unspecified: Secondary | ICD-10-CM | POA: Diagnosis not present

## 2019-06-01 DIAGNOSIS — K219 Gastro-esophageal reflux disease without esophagitis: Secondary | ICD-10-CM | POA: Diagnosis not present

## 2019-06-01 DIAGNOSIS — L89319 Pressure ulcer of right buttock, unspecified stage: Secondary | ICD-10-CM | POA: Diagnosis not present

## 2019-06-01 DIAGNOSIS — R54 Age-related physical debility: Secondary | ICD-10-CM | POA: Diagnosis not present

## 2019-06-01 DIAGNOSIS — E785 Hyperlipidemia, unspecified: Secondary | ICD-10-CM | POA: Diagnosis not present

## 2019-06-01 DIAGNOSIS — I4891 Unspecified atrial fibrillation: Secondary | ICD-10-CM | POA: Diagnosis not present

## 2019-06-01 DIAGNOSIS — Z950 Presence of cardiac pacemaker: Secondary | ICD-10-CM | POA: Diagnosis not present

## 2019-06-01 DIAGNOSIS — I10 Essential (primary) hypertension: Secondary | ICD-10-CM | POA: Diagnosis not present

## 2019-06-02 DIAGNOSIS — I495 Sick sinus syndrome: Secondary | ICD-10-CM | POA: Diagnosis not present

## 2019-06-02 DIAGNOSIS — I69320 Aphasia following cerebral infarction: Secondary | ICD-10-CM | POA: Diagnosis not present

## 2019-06-02 DIAGNOSIS — I69391 Dysphagia following cerebral infarction: Secondary | ICD-10-CM | POA: Diagnosis not present

## 2019-06-02 DIAGNOSIS — F0150 Vascular dementia without behavioral disturbance: Secondary | ICD-10-CM | POA: Diagnosis not present

## 2019-06-02 DIAGNOSIS — I4891 Unspecified atrial fibrillation: Secondary | ICD-10-CM | POA: Diagnosis not present

## 2019-06-02 DIAGNOSIS — I69351 Hemiplegia and hemiparesis following cerebral infarction affecting right dominant side: Secondary | ICD-10-CM | POA: Diagnosis not present

## 2019-06-06 DIAGNOSIS — I4891 Unspecified atrial fibrillation: Secondary | ICD-10-CM | POA: Diagnosis not present

## 2019-06-06 DIAGNOSIS — I69320 Aphasia following cerebral infarction: Secondary | ICD-10-CM | POA: Diagnosis not present

## 2019-06-06 DIAGNOSIS — I69351 Hemiplegia and hemiparesis following cerebral infarction affecting right dominant side: Secondary | ICD-10-CM | POA: Diagnosis not present

## 2019-06-06 DIAGNOSIS — F0150 Vascular dementia without behavioral disturbance: Secondary | ICD-10-CM | POA: Diagnosis not present

## 2019-06-06 DIAGNOSIS — I69391 Dysphagia following cerebral infarction: Secondary | ICD-10-CM | POA: Diagnosis not present

## 2019-06-06 DIAGNOSIS — I495 Sick sinus syndrome: Secondary | ICD-10-CM | POA: Diagnosis not present

## 2019-06-09 DIAGNOSIS — I69320 Aphasia following cerebral infarction: Secondary | ICD-10-CM | POA: Diagnosis not present

## 2019-06-09 DIAGNOSIS — I69351 Hemiplegia and hemiparesis following cerebral infarction affecting right dominant side: Secondary | ICD-10-CM | POA: Diagnosis not present

## 2019-06-09 DIAGNOSIS — F0150 Vascular dementia without behavioral disturbance: Secondary | ICD-10-CM | POA: Diagnosis not present

## 2019-06-09 DIAGNOSIS — I69391 Dysphagia following cerebral infarction: Secondary | ICD-10-CM | POA: Diagnosis not present

## 2019-06-09 DIAGNOSIS — I4891 Unspecified atrial fibrillation: Secondary | ICD-10-CM | POA: Diagnosis not present

## 2019-06-09 DIAGNOSIS — I495 Sick sinus syndrome: Secondary | ICD-10-CM | POA: Diagnosis not present

## 2019-06-13 DIAGNOSIS — F0150 Vascular dementia without behavioral disturbance: Secondary | ICD-10-CM | POA: Diagnosis not present

## 2019-06-13 DIAGNOSIS — I69391 Dysphagia following cerebral infarction: Secondary | ICD-10-CM | POA: Diagnosis not present

## 2019-06-13 DIAGNOSIS — I69320 Aphasia following cerebral infarction: Secondary | ICD-10-CM | POA: Diagnosis not present

## 2019-06-13 DIAGNOSIS — I495 Sick sinus syndrome: Secondary | ICD-10-CM | POA: Diagnosis not present

## 2019-06-13 DIAGNOSIS — I4891 Unspecified atrial fibrillation: Secondary | ICD-10-CM | POA: Diagnosis not present

## 2019-06-13 DIAGNOSIS — I69351 Hemiplegia and hemiparesis following cerebral infarction affecting right dominant side: Secondary | ICD-10-CM | POA: Diagnosis not present

## 2019-06-16 DIAGNOSIS — I69391 Dysphagia following cerebral infarction: Secondary | ICD-10-CM | POA: Diagnosis not present

## 2019-06-16 DIAGNOSIS — I495 Sick sinus syndrome: Secondary | ICD-10-CM | POA: Diagnosis not present

## 2019-06-16 DIAGNOSIS — I69351 Hemiplegia and hemiparesis following cerebral infarction affecting right dominant side: Secondary | ICD-10-CM | POA: Diagnosis not present

## 2019-06-16 DIAGNOSIS — I4891 Unspecified atrial fibrillation: Secondary | ICD-10-CM | POA: Diagnosis not present

## 2019-06-16 DIAGNOSIS — F0150 Vascular dementia without behavioral disturbance: Secondary | ICD-10-CM | POA: Diagnosis not present

## 2019-06-16 DIAGNOSIS — I69320 Aphasia following cerebral infarction: Secondary | ICD-10-CM | POA: Diagnosis not present

## 2019-06-27 DIAGNOSIS — R509 Fever, unspecified: Secondary | ICD-10-CM | POA: Diagnosis not present

## 2019-06-27 DIAGNOSIS — U071 COVID-19: Secondary | ICD-10-CM | POA: Diagnosis not present

## 2019-06-27 DIAGNOSIS — Z515 Encounter for palliative care: Secondary | ICD-10-CM | POA: Diagnosis not present

## 2019-06-28 DIAGNOSIS — I4891 Unspecified atrial fibrillation: Secondary | ICD-10-CM | POA: Diagnosis not present

## 2019-06-28 DIAGNOSIS — I69320 Aphasia following cerebral infarction: Secondary | ICD-10-CM | POA: Diagnosis not present

## 2019-06-28 DIAGNOSIS — I69391 Dysphagia following cerebral infarction: Secondary | ICD-10-CM | POA: Diagnosis not present

## 2019-06-28 DIAGNOSIS — F0150 Vascular dementia without behavioral disturbance: Secondary | ICD-10-CM | POA: Diagnosis not present

## 2019-06-28 DIAGNOSIS — I69351 Hemiplegia and hemiparesis following cerebral infarction affecting right dominant side: Secondary | ICD-10-CM | POA: Diagnosis not present

## 2019-06-28 DIAGNOSIS — I495 Sick sinus syndrome: Secondary | ICD-10-CM | POA: Diagnosis not present

## 2019-07-02 DEATH — deceased
